# Patient Record
Sex: Male | Born: 1970 | Race: White | Hispanic: No | Smoking: Current some day smoker
Health system: Southern US, Community
[De-identification: ages and names within clinical notes are randomized; demographics above are authoritative.]

## PROBLEM LIST (undated history)

## (undated) DIAGNOSIS — K219 Gastro-esophageal reflux disease without esophagitis: Secondary | ICD-10-CM

## (undated) DIAGNOSIS — M549 Dorsalgia, unspecified: Secondary | ICD-10-CM

## (undated) DIAGNOSIS — R7303 Prediabetes: Secondary | ICD-10-CM

## (undated) DIAGNOSIS — M1990 Unspecified osteoarthritis, unspecified site: Secondary | ICD-10-CM

## (undated) DIAGNOSIS — R768 Other specified abnormal immunological findings in serum: Secondary | ICD-10-CM

## (undated) DIAGNOSIS — M25569 Pain in unspecified knee: Secondary | ICD-10-CM

## (undated) DIAGNOSIS — F419 Anxiety disorder, unspecified: Secondary | ICD-10-CM

## (undated) DIAGNOSIS — M5416 Radiculopathy, lumbar region: Secondary | ICD-10-CM

## (undated) DIAGNOSIS — I10 Essential (primary) hypertension: Secondary | ICD-10-CM

## (undated) DIAGNOSIS — G8929 Other chronic pain: Secondary | ICD-10-CM

## (undated) DIAGNOSIS — K7400 Hepatic fibrosis, unspecified: Secondary | ICD-10-CM

## (undated) HISTORY — PX: OTHER SURGICAL HISTORY: SHX169

## (undated) HISTORY — PX: ELBOW SURGERY: SHX618

## (undated) HISTORY — PX: SEPTOPLASTY: SUR1290

## (undated) HISTORY — PX: HERNIA REPAIR: SHX51

## (undated) HISTORY — DX: Other specified abnormal immunological findings in serum: R76.8

## (undated) HISTORY — PX: BACK SURGERY: SHX140

## (undated) HISTORY — DX: Hepatic fibrosis, unspecified: K74.00

## (undated) HISTORY — PX: FOOT SURGERY: SHX648

## (undated) HISTORY — PX: KNEE SURGERY: SHX244

---

## 2005-05-07 ENCOUNTER — Emergency Department (HOSPITAL_COMMUNITY): Admission: EM | Admit: 2005-05-07 | Discharge: 2005-05-07 | Payer: Self-pay | Admitting: Emergency Medicine

## 2006-07-17 ENCOUNTER — Emergency Department (HOSPITAL_COMMUNITY): Admission: EM | Admit: 2006-07-17 | Discharge: 2006-07-17 | Payer: Self-pay | Admitting: Emergency Medicine

## 2007-05-19 ENCOUNTER — Emergency Department (HOSPITAL_COMMUNITY): Admission: EM | Admit: 2007-05-19 | Discharge: 2007-05-19 | Payer: Self-pay | Admitting: Emergency Medicine

## 2007-11-17 ENCOUNTER — Emergency Department (HOSPITAL_COMMUNITY): Admission: EM | Admit: 2007-11-17 | Discharge: 2007-11-17 | Payer: Self-pay | Admitting: Emergency Medicine

## 2008-01-10 ENCOUNTER — Ambulatory Visit (HOSPITAL_COMMUNITY): Admission: RE | Admit: 2008-01-10 | Discharge: 2008-01-10 | Payer: Self-pay | Admitting: General Surgery

## 2008-01-29 ENCOUNTER — Ambulatory Visit: Payer: Self-pay | Admitting: Internal Medicine

## 2008-02-18 ENCOUNTER — Ambulatory Visit (HOSPITAL_COMMUNITY): Admission: RE | Admit: 2008-02-18 | Discharge: 2008-02-18 | Payer: Self-pay | Admitting: Internal Medicine

## 2008-02-18 ENCOUNTER — Ambulatory Visit: Payer: Self-pay | Admitting: Internal Medicine

## 2008-03-19 ENCOUNTER — Ambulatory Visit: Payer: Self-pay | Admitting: Internal Medicine

## 2009-02-25 ENCOUNTER — Encounter (INDEPENDENT_AMBULATORY_CARE_PROVIDER_SITE_OTHER): Payer: Self-pay | Admitting: *Deleted

## 2009-04-19 ENCOUNTER — Emergency Department (HOSPITAL_COMMUNITY): Admission: EM | Admit: 2009-04-19 | Discharge: 2009-04-19 | Payer: Self-pay | Admitting: Emergency Medicine

## 2010-06-10 ENCOUNTER — Inpatient Hospital Stay (HOSPITAL_COMMUNITY)
Admission: RE | Admit: 2010-06-10 | Discharge: 2010-06-13 | DRG: 313 | Disposition: A | Discharge status: Home / Self Care | Payer: Medicaid Other | Source: Other Acute Inpatient Hospital | Attending: Cardiology | Admitting: Cardiology

## 2010-06-10 ENCOUNTER — Emergency Department (HOSPITAL_COMMUNITY): Payer: Medicaid Other

## 2010-06-10 ENCOUNTER — Emergency Department (HOSPITAL_COMMUNITY)
Admission: EM | Admit: 2010-06-10 | Discharge: 2010-06-10 | Disposition: A | Discharge status: Other Acute Inpatient Hospital | Payer: Medicaid Other | Source: Home / Self Care | Attending: Emergency Medicine | Admitting: Emergency Medicine

## 2010-06-10 DIAGNOSIS — F1210 Cannabis abuse, uncomplicated: Secondary | ICD-10-CM | POA: Diagnosis present

## 2010-06-10 DIAGNOSIS — Z87891 Personal history of nicotine dependence: Secondary | ICD-10-CM

## 2010-06-10 DIAGNOSIS — R079 Chest pain, unspecified: Secondary | ICD-10-CM | POA: Insufficient documentation

## 2010-06-10 DIAGNOSIS — M6282 Rhabdomyolysis: Secondary | ICD-10-CM | POA: Diagnosis present

## 2010-06-10 DIAGNOSIS — R0789 Other chest pain: Secondary | ICD-10-CM

## 2010-06-10 DIAGNOSIS — R42 Dizziness and giddiness: Secondary | ICD-10-CM | POA: Insufficient documentation

## 2010-06-10 DIAGNOSIS — K7689 Other specified diseases of liver: Secondary | ICD-10-CM | POA: Diagnosis present

## 2010-06-10 DIAGNOSIS — I200 Unstable angina: Secondary | ICD-10-CM | POA: Insufficient documentation

## 2010-06-10 DIAGNOSIS — E669 Obesity, unspecified: Secondary | ICD-10-CM | POA: Diagnosis present

## 2010-06-10 DIAGNOSIS — Z6830 Body mass index (BMI) 30.0-30.9, adult: Secondary | ICD-10-CM

## 2010-06-10 LAB — DIFFERENTIAL
Basophils Absolute: 0 10*3/uL (ref 0.0–0.1)
Basophils Relative: 0 % (ref 0–1)
Eosinophils Absolute: 0.1 10*3/uL (ref 0.0–0.7)
Eosinophils Relative: 1 % (ref 0–5)
Lymphocytes Relative: 13 % (ref 12–46)
Lymphs Abs: 1.4 10*3/uL (ref 0.7–4.0)
Monocytes Absolute: 0.6 10*3/uL (ref 0.1–1.0)
Monocytes Relative: 5 % (ref 3–12)
Neutro Abs: 8.7 10*3/uL — ABNORMAL HIGH (ref 1.7–7.7)
Neutrophils Relative %: 81 % — ABNORMAL HIGH (ref 43–77)

## 2010-06-10 LAB — BASIC METABOLIC PANEL
BUN: 21 mg/dL (ref 6–23)
CO2: 22 mEq/L (ref 19–32)
Calcium: 9.7 mg/dL (ref 8.4–10.5)
Chloride: 104 mEq/L (ref 96–112)
Creatinine, Ser: 1.26 mg/dL (ref 0.4–1.5)
GFR calc Af Amer: >60 mL/min (ref >60)
GFR calc non Af Amer: >60 mL/min (ref >60)
Glucose, Bld: 139 mg/dL — ABNORMAL HIGH (ref 70–99)
Potassium: 3.8 mEq/L (ref 3.5–5.1)
Sodium: 138 mEq/L (ref 135–145)

## 2010-06-10 LAB — CARDIAC PANEL(CRET KIN+CKTOT+MB+TROPI)
CK, MB: 15.4 ng/mL (ref 0.3–4.0)
Relative Index: 0.9 (ref 0.0–2.5)
Total CK: 1798 U/L — ABNORMAL HIGH (ref 7–232)
Troponin I: 0.01 ng/mL (ref 0.00–0.06)

## 2010-06-10 LAB — CBC
HCT: 40.1 % (ref 39.0–52.0)
Hemoglobin: 14.1 g/dL (ref 13.0–17.0)
MCH: 30.6 pg (ref 26.0–34.0)
MCHC: 35.2 g/dL (ref 30.0–36.0)
MCV: 87 fL (ref 78.0–100.0)
Platelets: 237 10*3/uL (ref 150–400)
RBC: 4.61 MIL/uL (ref 4.22–5.81)
RDW: 13.3 % (ref 11.5–15.5)
WBC: 10.7 10*3/uL — ABNORMAL HIGH (ref 4.0–10.5)

## 2010-06-10 LAB — HEPATIC FUNCTION PANEL
ALT: 54 U/L — ABNORMAL HIGH (ref 0–53)
AST: 114 U/L — ABNORMAL HIGH (ref 0–37)
Albumin: 4.7 g/dL (ref 3.5–5.2)
Alkaline Phosphatase: 31 U/L — ABNORMAL LOW (ref 39–117)
Bilirubin, Direct: 0.2 mg/dL (ref 0.0–0.3)
Indirect Bilirubin: 1.6 mg/dL — ABNORMAL HIGH (ref 0.3–0.9)
Total Bilirubin: 1.8 mg/dL — ABNORMAL HIGH (ref 0.3–1.2)
Total Protein: 7.7 g/dL (ref 6.0–8.3)

## 2010-06-10 LAB — POCT CARDIAC MARKERS
CKMB, poc: 14.4 ng/mL (ref 1.0–8.0)
CKMB, poc: 12.3 ng/mL (ref 1.0–8.0)
Myoglobin, poc: >500 ng/mL (ref 12–200)
Myoglobin, poc: >500 ng/mL (ref 12–200)
Troponin i, poc: <0.05 ng/mL (ref 0.00–0.09)
Troponin i, poc: <0.05 ng/mL (ref 0.00–0.09)

## 2010-06-10 LAB — GLUCOSE, CAPILLARY: Glucose-Capillary: 122 mg/dL — ABNORMAL HIGH (ref 70–99)

## 2010-06-11 DIAGNOSIS — I200 Unstable angina: Secondary | ICD-10-CM

## 2010-06-11 LAB — CBC
HCT: 36.5 % — ABNORMAL LOW (ref 39.0–52.0)
Hemoglobin: 12.7 g/dL — ABNORMAL LOW (ref 13.0–17.0)
MCH: 30.5 pg (ref 26.0–34.0)
MCHC: 34.8 g/dL (ref 30.0–36.0)
MCV: 87.5 fL (ref 78.0–100.0)
Platelets: 216 10*3/uL (ref 150–400)
RBC: 4.17 MIL/uL — ABNORMAL LOW (ref 4.22–5.81)
RDW: 13.4 % (ref 11.5–15.5)
WBC: 9.2 10*3/uL (ref 4.0–10.5)

## 2010-06-11 LAB — CARDIAC PANEL(CRET KIN+CKTOT+MB+TROPI)
CK, MB: 11.7 ng/mL (ref 0.3–4.0)
CK, MB: 9.7 ng/mL (ref 0.3–4.0)
Relative Index: 1 (ref 0.0–2.5)
Relative Index: 1.1 (ref 0.0–2.5)
Total CK: 1190 U/L — ABNORMAL HIGH (ref 7–232)
Total CK: 879 U/L — ABNORMAL HIGH (ref 7–232)
Troponin I: 0.02 ng/mL (ref 0.00–0.06)
Troponin I: <0.01 ng/mL (ref 0.00–0.06)

## 2010-06-11 LAB — LIPID PANEL
Cholesterol: 131 mg/dL (ref 0–200)
HDL: 39 mg/dL — ABNORMAL LOW (ref >39)
LDL Cholesterol: 79 mg/dL (ref 0–99)
Total CHOL/HDL Ratio: 3.4 RATIO
Triglycerides: 67 mg/dL (ref <150)
VLDL: 13 mg/dL (ref 0–40)

## 2010-06-11 LAB — HEPARIN LEVEL (UNFRACTIONATED): Heparin Unfractionated: 0.71 IU/mL — ABNORMAL HIGH (ref 0.30–0.70)

## 2010-06-11 LAB — HEMOGLOBIN A1C
Hgb A1c MFr Bld: 5.2 % (ref <5.7)
Mean Plasma Glucose: 103 mg/dL (ref <117)

## 2010-06-12 ENCOUNTER — Inpatient Hospital Stay (HOSPITAL_COMMUNITY): Payer: Medicaid Other

## 2010-06-12 LAB — CBC
HCT: 35.7 % — ABNORMAL LOW (ref 39.0–52.0)
Hemoglobin: 12.6 g/dL — ABNORMAL LOW (ref 13.0–17.0)
MCH: 31.1 pg (ref 26.0–34.0)
MCHC: 35.3 g/dL (ref 30.0–36.0)
MCV: 88.1 fL (ref 78.0–100.0)
Platelets: 205 10*3/uL (ref 150–400)
RBC: 4.05 MIL/uL — ABNORMAL LOW (ref 4.22–5.81)
RDW: 13.3 % (ref 11.5–15.5)
WBC: 7.1 10*3/uL (ref 4.0–10.5)

## 2010-06-12 LAB — HEPARIN LEVEL (UNFRACTIONATED)
Heparin Unfractionated: 0.28 IU/mL — ABNORMAL LOW (ref 0.30–0.70)
Heparin Unfractionated: 0.28 IU/mL — ABNORMAL LOW (ref 0.30–0.70)
Heparin Unfractionated: 0.3 IU/mL (ref 0.30–0.70)

## 2010-06-12 LAB — HEPATITIS PANEL, ACUTE
HCV Ab: NEGATIVE
Hep A IgM: NEGATIVE
Hep B C IgM: NEGATIVE
Hepatitis B Surface Ag: NEGATIVE

## 2010-06-12 LAB — TSH: TSH: 0.541 u[IU]/mL (ref 0.350–4.500)

## 2010-06-13 DIAGNOSIS — R079 Chest pain, unspecified: Secondary | ICD-10-CM

## 2010-06-13 LAB — COMPREHENSIVE METABOLIC PANEL
ALT: 42 U/L (ref 0–53)
AST: 33 U/L (ref 0–37)
Albumin: 4.2 g/dL (ref 3.5–5.2)
Alkaline Phosphatase: 26 U/L — ABNORMAL LOW (ref 39–117)
BUN: 11 mg/dL (ref 6–23)
CO2: 24 mEq/L (ref 19–32)
Calcium: 9.1 mg/dL (ref 8.4–10.5)
Chloride: 108 mEq/L (ref 96–112)
Creatinine, Ser: 1.01 mg/dL (ref 0.4–1.5)
GFR calc Af Amer: >60 mL/min (ref >60)
GFR calc non Af Amer: >60 mL/min (ref >60)
Glucose, Bld: 92 mg/dL (ref 70–99)
Potassium: 4 mEq/L (ref 3.5–5.1)
Sodium: 140 mEq/L (ref 135–145)
Total Bilirubin: 0.9 mg/dL (ref 0.3–1.2)
Total Protein: 7.3 g/dL (ref 6.0–8.3)

## 2010-06-13 LAB — PROTIME-INR
INR: 0.98 (ref 0.00–1.49)
Prothrombin Time: 13.2 seconds (ref 11.6–15.2)

## 2010-06-13 LAB — CBC
HCT: 38.8 % — ABNORMAL LOW (ref 39.0–52.0)
Hemoglobin: 13.7 g/dL (ref 13.0–17.0)
MCH: 31.1 pg (ref 26.0–34.0)
MCHC: 35.3 g/dL (ref 30.0–36.0)
MCV: 88.2 fL (ref 78.0–100.0)
Platelets: 240 10*3/uL (ref 150–400)
RBC: 4.4 MIL/uL (ref 4.22–5.81)
RDW: 13.3 % (ref 11.5–15.5)
WBC: 7.3 10*3/uL (ref 4.0–10.5)

## 2010-06-13 LAB — HEPARIN LEVEL (UNFRACTIONATED): Heparin Unfractionated: 0.52 IU/mL (ref 0.30–0.70)

## 2010-06-17 NOTE — Procedures (Signed)
  NAMESCHYLER, COUNSELL NO.:  [id number]  MEDICAL RECORD NO.:  [id number]          PATIENT TYPE:  LOCATION:                                 FACILITY:  PHYSICIAN:  Peter C. Eden Emms, MD, FACCDATE OF BIRTH:  Sep 25, 1970  DATE OF PROCEDURE: DATE OF DISCHARGE:                           CARDIAC CATHETERIZATION   CORONARY ARTERIOGRAPHY  INDICATION:  Recurrent chest pain although the patient has known esophagitis and gastritis.  He has been having recurrent symptoms. Catheterization was done to rule out coronary disease.  Left main coronary artery is normal.  Left anterior descending artery was normal in the proximal, mid, and distal portions.  The first diagonal branch was normal.  Second diagonal branch was normal.  Third diagonal branch was normal.  Circumflex coronary artery was nondominant.  There was a single large branching obtuse marginal branch which was normal.  The AV groove branch was normal.  The right coronary artery was dominant and normal.  RAO ventriculography.  RAO ventriculography was normal, EF was 60%. There was no gradient across the aortic valve and no MR.  Aortic pressure was 129/97, LV pressure was 142/70.  IMPRESSION:  The patient has no significant coronary artery disease. His pain would appear to be from his gastritis.  Further gastrointestinal followup is in order.  We did try to Angio-Seal him but had some bleeding.  He will be maintained here in the hospital for 4 hours and discharge later today as long as his groin heals well.     Noralyn Pick. Eden Emms, MD, [facility]     PCN/MEDQ  D:  06/13/2010  T:  06/13/2010  Job:  [id number]  Electronically Signed by Charlton Haws MD [facility] on 06/16/2010 09:08:58 AM

## 2010-06-21 ENCOUNTER — Emergency Department (HOSPITAL_COMMUNITY)
Admission: EM | Admit: 2010-06-21 | Discharge: 2010-06-21 | Disposition: A | Discharge status: Home / Self Care | Payer: Medicaid Other | Attending: Emergency Medicine | Admitting: Emergency Medicine

## 2010-06-21 ENCOUNTER — Encounter: Payer: Self-pay | Admitting: Orthopedic Surgery

## 2010-06-21 ENCOUNTER — Telehealth: Payer: Self-pay | Admitting: *Deleted

## 2010-06-21 DIAGNOSIS — S8010XA Contusion of unspecified lower leg, initial encounter: Secondary | ICD-10-CM

## 2010-06-21 DIAGNOSIS — M79609 Pain in unspecified limb: Secondary | ICD-10-CM | POA: Insufficient documentation

## 2010-06-21 LAB — COMPREHENSIVE METABOLIC PANEL
ALT: 27 U/L (ref 0–53)
AST: 18 U/L (ref 0–37)
Albumin: 4.6 g/dL (ref 3.5–5.2)
Alkaline Phosphatase: 28 U/L — ABNORMAL LOW (ref 39–117)
BUN: 19 mg/dL (ref 6–23)
CO2: 28 mEq/L (ref 19–32)
Calcium: 9.5 mg/dL (ref 8.4–10.5)
Chloride: 101 mEq/L (ref 96–112)
Creatinine, Ser: 1.13 mg/dL (ref 0.4–1.5)
GFR calc Af Amer: >60 mL/min (ref >60)
GFR calc non Af Amer: >60 mL/min (ref >60)
Glucose, Bld: 90 mg/dL (ref 70–99)
Potassium: 3.9 mEq/L (ref 3.5–5.1)
Sodium: 137 mEq/L (ref 135–145)
Total Bilirubin: 1.2 mg/dL (ref 0.3–1.2)
Total Protein: 7.6 g/dL (ref 6.0–8.3)

## 2010-06-21 LAB — DIFFERENTIAL
Basophils Absolute: 0 10*3/uL (ref 0.0–0.1)
Basophils Relative: 1 % (ref 0–1)
Eosinophils Absolute: 0 10*3/uL (ref 0.0–0.7)
Eosinophils Relative: 1 % (ref 0–5)
Lymphocytes Relative: 21 % (ref 12–46)
Lymphs Abs: 1.6 10*3/uL (ref 0.7–4.0)
Monocytes Absolute: 0.5 10*3/uL (ref 0.1–1.0)
Monocytes Relative: 7 % (ref 3–12)
Neutro Abs: 5.4 10*3/uL (ref 1.7–7.7)
Neutrophils Relative %: 71 % (ref 43–77)

## 2010-06-21 LAB — CBC
HCT: 40.1 % (ref 39.0–52.0)
Hemoglobin: 14.2 g/dL (ref 13.0–17.0)
MCH: 31.1 pg (ref 26.0–34.0)
MCHC: 35.4 g/dL (ref 30.0–36.0)
MCV: 87.9 fL (ref 78.0–100.0)
Platelets: 255 10*3/uL (ref 150–400)
RBC: 4.56 MIL/uL (ref 4.22–5.81)
RDW: 13.3 % (ref 11.5–15.5)
WBC: 7.6 10*3/uL (ref 4.0–10.5)

## 2010-06-21 LAB — D-DIMER, QUANTITATIVE (NOT AT ARMC): D-Dimer, Quant: 0.22 ug/mL-FEU (ref 0.00–0.48)

## 2010-06-22 ENCOUNTER — Ambulatory Visit (HOSPITAL_COMMUNITY)
Admit: 2010-06-22 | Discharge: 2010-06-22 | Disposition: A | Discharge status: Home / Self Care | Payer: Medicaid Other | Source: Ambulatory Visit | Attending: Emergency Medicine | Admitting: Emergency Medicine

## 2010-06-22 DIAGNOSIS — IMO0002 Reserved for concepts with insufficient information to code with codable children: Secondary | ICD-10-CM | POA: Insufficient documentation

## 2010-06-22 DIAGNOSIS — Y840 Cardiac catheterization as the cause of abnormal reaction of the patient, or of later complication, without mention of misadventure at the time of the procedure: Secondary | ICD-10-CM | POA: Insufficient documentation

## 2010-06-23 ENCOUNTER — Telehealth: Payer: Self-pay | Admitting: Cardiovascular Disease
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
## 2010-06-27 ENCOUNTER — Encounter: Payer: Self-pay | Admitting: *Deleted
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
## 2010-06-28 NOTE — Progress Notes (Signed)
Summary: Pain in leg where cath was done  Phone Note Call from Patient Call back at [phone]   Caller: Spouse Rosaland Lao) Reason for Call: Talk to Nurse Summary of Call: patient's wife states that patient is having pain when standing / pt had cath last week / pls leave message if no answer at above number/tg Initial call taken by: Raechel Ache [facility],  June 21, 2010 11:38 AM  Follow-up for Phone Call        Per recommendations by K.Lawrence,NP asked pt to go to ED for evaluaition of leg for bleed or clot.Called ED and spoke with charge nurse to report pt to come at 4:30pm Follow-up by: Teressa Lower RN,  June 21, 2010 1:09 PM  Additional Follow-up for Phone Call Additional follow up Details #1::        faxed, records to ED, [phone] atten charge nurse Additional Follow-up by: Teressa Lower RN,  June 21, 2010 1:13 PM

## 2010-07-07 NOTE — Letter (Signed)
Summary: Clearance Letter  [employer], Main Office  [address]   [place], [place] [address]   Phone: [phone]  Fax: [phone]    June 27, 2010  Re:     Jamie [name] Address:   [address]     [place], [place]  [address] DOB:     1970/07/22 MRN:     [id number]   TO WHOM IT MAY CONCERN,       Mr Gartrell is clear from a cardiac standpoint for dental work. Please call with any questions or concerns.    Sincerely,  Deliah Goody, RN/Dr Charlton Haws

## 2010-07-07 NOTE — Progress Notes (Signed)
Summary: pt needs okay for tooth extraction/2nd call  Phone Note Call from Patient Call back at [phone]   Caller: Patient 's wife  Jamie [name] Summary of Call: needs letter to dentist for okay on tooth extraction fax to [phone] att Jamie [name] Initial call taken by: Glynda Jaeger,  June 23, 2010 9:32 AM  Follow-up for Phone Call        2nd call re letter for  dental work Follow-up by: Roe Coombs,  June 23, 2010 2:57 PM  Additional Follow-up for Phone Call Additional follow up Details #1::        number disconnected Deliah Goody, RN  June 23, 2010 5:44 PM Pt returning call,call her back at [phone] [facility]  June 27, 2010 8:10 AM  left message at number provided. pt had a normal cath and will be fine for dental work. note faxed to number provided. Deliah Goody, RN  June 27, 2010 3:32 PM

## 2010-07-20 ENCOUNTER — Encounter: Payer: Self-pay | Admitting: Orthopedic Surgery

## 2010-07-20 ENCOUNTER — Ambulatory Visit (INDEPENDENT_AMBULATORY_CARE_PROVIDER_SITE_OTHER): Payer: Medicaid Other | Admitting: Orthopedic Surgery

## 2010-07-20 VITALS — HR 76 | Resp 18 | Ht 71.0 in | Wt 226.0 lb

## 2010-07-20 DIAGNOSIS — IMO0002 Reserved for concepts with insufficient information to code with codable children: Secondary | ICD-10-CM

## 2010-07-20 DIAGNOSIS — M1710 Unilateral primary osteoarthritis, unspecified knee: Secondary | ICD-10-CM

## 2010-07-20 MED ORDER — NABUMETONE 500 MG PO TABS: 500.0000 mg | ORAL_TABLET | Freq: Two times a day (BID) | ORAL | Status: AC

## 2010-07-20 MED ORDER — METHYLPREDNISOLONE ACETATE 40 MG/ML IJ SUSP: 40.0000 mg | Freq: Once | INTRAMUSCULAR | Status: DC

## 2010-07-20 NOTE — Progress Notes (Signed)
-  year-old male complains of bilateral knee pain and swelling with sharp throbbing stabbing burning constant pain worse with activity and relieved somewhat with rest.  The RIGHT and LEFT lower extremities however have different symptoms  On the RIGHT side he complains of random intermittent giving out of the RIGHT lower extremity with RIGHT leg discoloration where the leg will turn blue or dark.  He denies history of lumbar disc disease but does admit to lower back pain and right-sided lumbar discomfort.  The LEFT knee he reports crepitance and anterior medial and lateral pain which is diffuse and nonspecific but nonradiating.  Treatments to this point include ibuprofen and extra strength Tylenol.  He has no injection history  He does note that after his RIGHT knee surgery his knee did improve until about 10 years ago when he started having the giving out symptoms  Review of systems for chest pain which was worked up and was found to be negative he has some snoring constipation heartburn frequency joint pain and swelling stiffness of the knee no skin changes he does report some numbness and tingling unsteady gait as reported.  He has some depression denies easy bleeding or bruising has excessive urination but denies seasonal ALLERGIES unexpected weight loss or blurred vision   General: The patient is normally developed, with normal grooming and hygiene. There are no gross deformities. The body habitus is normal   CDV: The pulse and perfusion of the extremities are normal   LYMPH: There is no gross lymphadenopathy in the extremities   Skin: There are no rashes, ulcers or cafe-au-lait spot   Psyche: The patient is alert, awake and oriented.  Mood is normal   Neuro:  The coordination and balance are normal.  Sensation is normal. Reflexes are 2+ and equal   Musculoskeletal  RIGHT lower extremity exam straight leg raise did produce some hip/back pain in the lower RIGHT lumbar spine.  However  his ligaments were stable he had no joint line tenderness and no swelling his muscle tone is excellent.  His range of motion was full.  I was able to elicit patellofemoral crepitance and he had a positive quadriceps contraction test for patellofemoral pain  LEFT knee exam showed stable ligaments no swelling no tenderness he did have pain with the quadriceps contraction maneuver.  His muscle strength and muscle tone was normal range of motion and the knee was normal.  Straight leg raise test on the LEFT was normal  I also examined his lumbar spine he is being followed for a subcutaneous mass on the RIGHT lower part of his back.  He had no buttock tenderness he did have some lower lumbar spine tenderness and pain with extension no pain with flexion.  Radiographs were taken in the office and it shows that both knees have adequate and abundant joint space between the femoral tibial joints.  There is no evidence of significant patellofemoral disease.  He does have 2 screws in the RIGHT knee for patella fracture  Impression patellofemoral arthritis  I recommended we try a nonsteroidal along with injections.  He agreed to allow me to send a report to his primary care physician.  I recommend that he get a vascular consult to evaluate the intermittent random discoloration of the RIGHT lower extremity.  I also am going to recommend his primary care physician did a neurologist to evaluate his unexplainable giving out of the RIGHT leg with a normal knee exam.

## 2010-07-20 NOTE — Patient Instructions (Signed)
You have received a steroid shot. 15% of patients experience increased pain at the injection site with in the next 24 hours. This is best treated with ice and tylenol extra strength 2 tabs every 8 hours. If you are still having pain please call the office.    Start new medication

## 2010-07-20 NOTE — Discharge Summary (Signed)
Dear Kingsley Plan  I have evaluated your patient Jamie [name].  He has patellofemoral arthritis in both knees.  I gave him 2 injections and started him on nabumetone 500 mg twice a day.  However, he has other complaints which are not orthopaedic and I think you should be aware of these and perhaps work him up further.  He has a complaint of discoloration of his RIGHT lower extremity with the leg intermittently and randomly turning blue and dusky.  He has a family history of peripheral vascular disease.  He also complains of giving out of the RIGHT lower extremity but he has a normal ligament exam of the RIGHT knee and very minimal arthritic changes on x-ray which did not explain this.  A neurology consult is probably in his best interest but I will leave that to your discretion.  Sincerely,  Dr. Romeo Apple

## 2010-08-23 NOTE — Op Note (Signed)
Jamie [name], Jamie [name]                ACCOUNT NO.:  [id number]   MEDICAL RECORD NO.:  [id number]          PATIENT TYPE:  AMB   LOCATION:  DAY                           FACILITY:  [facility]   PHYSICIAN:  R. Roetta Sessions, M.D. DATE OF BIRTH:  1970-08-29   DATE OF PROCEDURE:  DATE OF DISCHARGE:                               OPERATIVE REPORT   PROCEDURE:  Diagnostic EGD followed by diagnostic ileal colonoscopy.   INDICATIONS FOR PROCEDURE:  A 40 year old gentleman with 31-month history  of intermittent hematochezia, Hemoccult positive, family history of  first-degree relative relatively at young age positive for colon cancer,  and has a long-standing gastroesophageal reflux disease symptoms and  describes esophageal dysphagia.  He has not been treated with any acid  suppression therapy.  EGD and colonoscopy is now being done.  Risks,  benefits, alternatives, and limitations have been reviewed and questions  answered.  Please see the documentation in the medical record.   PROCEDURE NOTE:  O2 saturation, blood pressure, pulse, and respirations  were monitored throughout the entire procedure.   CONSCIOUS SEDATION:  Versed 8 mg IV, Demerol 150 mg IV in divided doses,  Phenergan 25 mg IV diluted slow IV push to augment conscious sedation.   INSTRUMENT:  Pentax video chip system.   ANESTHESIA:  Cetacaine spray for topical pharyngeal anesthesia.   FINDINGS:  EGD; the examination of tubular esophagus revealed a widely  patent tubular esophagus through the EG junction.  There were some  inverted V erosions straddling the EG junction.  There was no Barrett  esophagus.  There was no neoplasm.  The esophagus did not have any  appearance of eosinophilic infiltration.  There were no furrows.  There  was no ringed appearance.  EG junction was easily traversed.  Stomach:  Gastric cavity was emptied and insufflated well with air.  Thorough  examination of the gastric mucosa including retroflexed view of  the  proximal stomach and esophagogastric junction demonstrated only a small  hiatal hernia.  Pylorus was patent and easily traversed.  Examination of  the bulb, second portion revealed no abnormalities.  Therapeutic/diagnostic maneuvers performed, none.   The patient tolerated the procedure well and he was prepared for  colonoscopy.  Digital rectal exam revealed no abnormalities.  The scope  was placed.  Prep was adequate.  Colon:  Colonic mucosa was surveyed  from the rectosigmoid junction through the left transverse, right colon,  the appendiceal orifice, ileocecal valve, and cecum.  These structures  were well seen and photographed for the record.  Terminal ileum was  intubated to 10 cm.  From this level, the scope was slowly and  cautiously withdrawn.  All previously mentioned mucosal surfaces were  again seen.  The patient had an orange bilious coating of stool in right  colon, which made the exam more interesting (the patient consumed orange  Jello yesterday).  Mucosal surface were ultimately well seen and mucosa  of the colon appeared normal as did the terminal ileal mucosa.  Scope  was pulled down the rectum where thorough examination of rectal mucosa  including retroflexion  of the anal verge and anoscopy of the anal canal  demonstrated friable anal canal hemorrhoids only.  The patient tolerated  both procedures well and was reactive to Endoscopy.   IMPRESSION:  1. EGD; distal esophageal erosions consistent with erosive reflux      esophagitis, patulous esophagogastric junction, and widely tubular      patent esophagus.  No endoscopic evidence for eosinophilic      esophagitis.  2. Small hiatal hernia, otherwise normal stomach, D1 and D2.  Colonoscopy findings; friable anal canal hemorrhoids, otherwise normal  rectum, colon, terminal ileum.   RECOMMENDATIONS:  1. Hemorrhoid and gastroesophageal reflux literature provided to Mr.      Carino.  Began Aciphex 20 mg orally daily  30 minutes before      breakfast.  He is to go to my office for free samples.  A 10-day      course of Anusol-HC suppositories 1 per rectum at bedtime.  2. Follow up with [place] in 1 month, so we can assess his progress.      Jonathon Bellows, M.D.  Electronically Signed     RMR/MEDQ  D:  02/18/2008  T:  02/19/2008  Job:  [id number]   cc:   Dr. Scarlett Presto

## 2010-08-23 NOTE — H&P (Signed)
Jamie [name], Jamie [name]                ACCOUNT NO.:  [id number]   MEDICAL RECORD NO.:  [id number]          PATIENT TYPE:  AMB   LOCATION:  DAY                           FACILITY:  [facility]   PHYSICIAN:  R. Roetta Sessions, M.D. DATE OF BIRTH:  1970/12/26   DATE OF ADMISSION:  DATE OF DISCHARGE:  LH                              HISTORY & PHYSICAL   REFERRING PHYSICIAN:  Barbaraann Barthel, MD.   REASON FOR CONSULTATION:  Hematochezia and positive family history of  colon cancer.   HISTORY OF PRESENT ILLNESS:  [name] is a very pleasant 40-year-  old gentleman sent over courtesy of Dr. Barbaraann Barthel to further  evaluate hematochezia and Hemoccult positive stool.  Jamie [name] has noted  intermittent blood per rectum when having, otherwise, normal-appearing  bowel movements over the past 2 months.  He was seen by Dr. Malvin Johns  recently who performed anoscopy and found hemorrhoids, but there was no  evidence of bleeding and felt he needed further evaluation.   Jamie [name] has had some vague right-sided abdominal pain and it has noted  a knot just to the right of the spine he has had for years and  periodically swells up, and it causes him severe discomfort with  radiation around to the right abdomen.  He is barely perceptible when it  is not causing him any problems, but it gets as large as a golf ball  on occasion.  There is no drainage.  Jamie [name] denies melena.  He really  denies constipation or diarrhea.  He has never had his lower GI tract  imaged.  He also has daily heartburn symptoms, which he has had for many  many years and describes a prominent esophageal dysphagia to solids and  pills.  He has never had his upper GI tract evaluated.  He has a family  history most significant for his father being diagnosed with colorectal  cancer in his late 30s and as stated, Jamie [name] has really had no  imaging of his rectum/colon previously.  He has not lost any weight.  He  has used [employer] and other NSAIDs on a daily basis in the past,  but has not really used any of those agents in the last several months.  He has no history of peptic ulcer disease or other gastrointestinal  illness.   I would like to thank Dr. Barbaraann Barthel for allowing me to see this  very nice gentleman.  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  Otherwise, no acute or concerning findings,   PAST MEDICAL HISTORY:  Significant for chronic back pain, had multiple  head injuries without apparent major sequelae.   PAST SURGERIES:  Right inguinal hernia repair as a child, right foot  surgery, and right knee repair.  He had a compound fracture of the left  arm for which he had surgery.   CURRENT MEDICATIONS:  1. Flexeril 10 mg at bedtime.  2. Finishing a course of antibiotics, unknown type, for his back.  3. Tylenol p.r.n.  4. Xanax p.r.n., currently not taking.   ALLERGIES:  No known drug allergies.   FAMILY HISTORY:  Positive for colorectal cancer in his father at a  relatively young age as stated.  Mother is alive with diabetes.  Father  is living with a history of colon cancer, diabetes, and heart disease;  otherwise, a negative chronic GI or liver illness.   SOCIAL HISTORY:  The patient is married.  He has 2 children.  He works  at [employer] doing various jobs, but otherwise is not employed.  He  smokes one pack of cigarettes about every 3 days.  He has had a social  drinking history, but has not had any at all in the past 1 year.   REVIEW OF SYSTEMS:  As in history of present illness.  No chest pain.  No dyspnea on exertion.  No fever or chills.  No change in weight.  No  night sweats.   PHYSICAL EXAMINATION:  GENERAL:  A pleasant 40 year old gentleman  accompanied by his wife.  VITAL SIGNS:  Weight 280 pounds,  height 5 feet 11 inches, temperature  91, BP 126/88, and pulse 60.  SKIN:  Warm and dry.  There is no jaundice.  HEENT:  No scleral icterus.  Conjunctivae are pink.  CHEST:  Lungs are clear to auscultation.  CARDIAC:  Regular rate and rhythm without murmur, gallop, or rub.  ABDOMEN:  Obese, positive bowel sounds.  Soft and nontender without  appreciable mass or organomegaly.  EXTREMITIES:  No edema.  BACK:  He does have a marble-size soft tissue mass which is freely  movable to rather deep palpation just to the right of the lumbar spine.  There is no skin lesions overlying this area.  It is nontender.  RECTAL:  Deferred at the time of colonoscopy.   IMPRESSION:  [name] is a very pleasant 40 year old gentleman  with a 49-month history of intermittent hematochezia.  He is Hemoccult  positive.  He has a positive family history of colon cancer in a first-  degree relative at a relatively young age.  He has had some nonspecific  intermittent radicular-type right-sided abdominal pain.  He has a soft  tissue mass, which he has had for years which periodically becomes very  painful, enlarges and then shrinks.   Jamie [name] also has prominent long-standing gastroesophageal reflux  disease symptoms and describes esophageal dysphagia to pills and solid  food.   RECOMMENDATIONS:  Jamie [name] needs to have both a diagnostic colonoscopy  and an EGD with possible esophageal dilation as appropriate in the very  near future.  I reviewed the risks, benefits, alternatives, and  limitations in some detail with he and his wife today.  Questions were  answered and all parties agreeable.  His right-sided abdominal pain may  not have anything to do with hematochezia.  I do get a sense that there  is a radicular component from his right back/flank when he has problems  with the soft tissue mass.  He is to see Dr. Malvin Johns in regards to  surgical therapy of this lesion once he has had a workup for the  GI  bleed.  We will make further recommendations in the very near future  once the endoscopic evaluation has taken place.   In addition, I doubt that the lesion seen in his right  kidney on recent  CT scan has much to do with any of his current symptoms, but fully agree  with interval followup imaging.      Jamie [name], M.D.  Electronically Signed     RMR/MEDQ  D:  01/29/2008  T:  01/30/2008  Job:  [id number]   cc:   Barbaraann Barthel, M.D.  Fax: [phone]

## 2010-08-23 NOTE — Assessment & Plan Note (Signed)
[name], Jamie [name]                 CHART#:  [id number]   DATE:  03/19/2008                       DOB:  1970-08-09   FOLLOWUP:  GERD/erosive reflux esophagitis seen on EGD on 02/18/2008.  Colonoscopy at that time for hematochezia demonstrated friable anal  canal hemorrhoids, otherwise rectum, colon, and terminal ileum appeared  normal.  We started him on some Aciphex and this has been associated  with marked dramatic improvement in his reflux symptoms.  He is able to  sleep in bed at night and not have to remain upright in recliner.  Although he does have difficulty sleeping because of very loud snoring,  he is constantly awakened with snoring.  His wife states he stops  breathing.  He has gained 5 pounds since his last visit.  Overall, he is  very happy with his response to Aciphex.  He has a positive family  history of colon cancer in a relatively young first-degree relatives  (father's age 60).   CURRENT MEDICATIONS:  See updated list.   ALLERGIES:  No known drug allergies.   PHYSICAL EXAMINATION:  GENERAL:  He is accompanied by his wife.  VITAL SIGNS:  Weight 285, height 5 feet 11 inches, temperature 98.2, BP  110/82, and pulse 64.  Detailed exam was deferred.   ASSESSMENT:  1. Gastroesophageal reflux disease/history of erosive reflux      esophagitis, response with the Aciphex 20 mg orally daily.  2. Recent rectal bleeding found to be secondary to anal canal      hemorrhoids on colonoscopy, positive family history of colon      cancer.   RECOMMENDATIONS:  1. Strive for 15-20-pound weight loss over the next 12 months.      Continue Aciphex 20 mg orally daily.  Samples and prescription      given.  2. No late night meals, no eating before 3 hours going to bed.  3. Positive family history of colon cancer with essentially negative      colonoscopy recently.  He would need his next colonoscopy at age      30.  4. Loud snoring, poor sleep pattern.  I suspect, he has sleep  apnea.      I recommend he did talk      with Dr. Malvin Johns and Dr. Wende Crease about getting a sleep study.      Unless something comes up, I will plan to see this nice gentleman      back in 1 year.       Jonathon Bellows, M.D.  Electronically Signed     RMR/MEDQ  D:  03/19/2008  T:  03/19/2008  Job:  [id number]   cc:   Dr. Malvin Johns

## 2010-12-30 LAB — DIFFERENTIAL
Basophils Absolute: 0
Basophils Relative: 0
Eosinophils Absolute: 0.1
Eosinophils Relative: 2
Lymphocytes Relative: 26
Lymphs Abs: 2.3
Monocytes Absolute: 0.5
Monocytes Relative: 6
Neutro Abs: 5.9
Neutrophils Relative %: 66

## 2010-12-30 LAB — CBC
HCT: 41.5
Hemoglobin: 14.8
MCHC: 35.7
MCV: 87
Platelets: 240
RBC: 4.77
RDW: 13
WBC: 8.9

## 2010-12-30 LAB — BASIC METABOLIC PANEL
BUN: 20
CO2: 29
Calcium: 9
Chloride: 105
Creatinine, Ser: 1.09
GFR calc Af Amer: >60
GFR calc non Af Amer: >60
Glucose, Bld: 101 — ABNORMAL HIGH
Potassium: 4.7
Sodium: 138

## 2010-12-30 LAB — POCT CARDIAC MARKERS
CKMB, poc: 1.6
Myoglobin, poc: 154
Operator id: 270681
Troponin i, poc: <0.05

## 2011-01-06 LAB — BASIC METABOLIC PANEL
BUN: 16
CO2: 27
Calcium: 9.9
Chloride: 106
Creatinine, Ser: 1.07
GFR calc Af Amer: >60
GFR calc non Af Amer: >60
Glucose, Bld: 92
Potassium: 4
Sodium: 140

## 2011-01-06 LAB — URINE CULTURE
Colony Count: NO GROWTH
Culture: NO GROWTH

## 2011-01-06 LAB — DIFFERENTIAL
Basophils Absolute: 0
Basophils Relative: 1
Eosinophils Absolute: 0.1
Eosinophils Relative: 1
Lymphocytes Relative: 18
Lymphs Abs: 1.5
Monocytes Absolute: 0.5
Monocytes Relative: 6
Neutro Abs: 6.2
Neutrophils Relative %: 75

## 2011-01-06 LAB — URINALYSIS, ROUTINE W REFLEX MICROSCOPIC
Bilirubin Urine: NEGATIVE
Glucose, UA: NEGATIVE
Ketones, ur: NEGATIVE
Leukocytes, UA: NEGATIVE
Nitrite: NEGATIVE
Protein, ur: NEGATIVE
Specific Gravity, Urine: 1.02
Urobilinogen, UA: 0.2
pH: 6

## 2011-01-06 LAB — RAPID URINE DRUG SCREEN, HOSP PERFORMED
Amphetamines: NOT DETECTED
Barbiturates: NOT DETECTED
Benzodiazepines: POSITIVE — AB
Cocaine: NOT DETECTED
Opiates: POSITIVE — AB
Tetrahydrocannabinol: POSITIVE — AB

## 2011-01-06 LAB — URINE MICROSCOPIC-ADD ON

## 2011-01-06 LAB — CBC
HCT: 47.6
Hemoglobin: 16.2
MCHC: 34.1
MCV: 88
Platelets: 256
RBC: 5.41
RDW: 13.4
WBC: 8.3

## 2011-01-06 LAB — ETHANOL: Alcohol, Ethyl (B): <5

## 2011-01-13 ENCOUNTER — Encounter (HOSPITAL_COMMUNITY): Payer: Self-pay

## 2011-01-13 ENCOUNTER — Emergency Department (HOSPITAL_COMMUNITY): Payer: Medicaid Other

## 2011-01-13 ENCOUNTER — Emergency Department (HOSPITAL_COMMUNITY)
Admission: EM | Admit: 2011-01-13 | Discharge: 2011-01-13 | Disposition: A | Discharge status: Home / Self Care | Payer: Medicaid Other | Attending: Emergency Medicine | Admitting: Emergency Medicine

## 2011-01-13 DIAGNOSIS — W1809XA Striking against other object with subsequent fall, initial encounter: Secondary | ICD-10-CM | POA: Insufficient documentation

## 2011-01-13 DIAGNOSIS — W1789XA Other fall from one level to another, initial encounter: Secondary | ICD-10-CM | POA: Insufficient documentation

## 2011-01-13 DIAGNOSIS — S60229A Contusion of unspecified hand, initial encounter: Secondary | ICD-10-CM | POA: Insufficient documentation

## 2011-01-13 DIAGNOSIS — Z87891 Personal history of nicotine dependence: Secondary | ICD-10-CM | POA: Insufficient documentation

## 2011-01-13 DIAGNOSIS — IMO0002 Reserved for concepts with insufficient information to code with codable children: Secondary | ICD-10-CM | POA: Insufficient documentation

## 2011-01-13 DIAGNOSIS — S60511A Abrasion of right hand, initial encounter: Secondary | ICD-10-CM

## 2011-01-13 MED ORDER — HYDROCODONE-ACETAMINOPHEN 5-325 MG PO TABS: 1.0000 | ORAL_TABLET | ORAL | Status: AC | PRN

## 2011-01-13 MED ORDER — BACITRACIN-NEOMYCIN-POLYMYXIN 400-5-5000 EX OINT
TOPICAL_OINTMENT | Freq: Once | CUTANEOUS | Status: AC
  Administered 2011-01-13: 1 via TOPICAL
  Filled 2011-01-13: qty 1

## 2011-01-13 MED ORDER — CEPHALEXIN 500 MG PO CAPS: 500.0000 mg | ORAL_CAPSULE | Freq: Four times a day (QID) | ORAL | Status: AC

## 2011-01-13 NOTE — ED Provider Notes (Signed)
History     CSN: [id number] Arrival date & time: 01/13/2011  1:06 PM  Chief Complaint  Patient presents with  . Hand Injury    (Consider location/radiation/quality/duration/timing/severity/associated sxs/prior treatment) Patient is a 40 y.o. male presenting with hand injury. The history is provided by the patient.  Hand Injury  The incident occurred 3 to 5 hours ago. The incident occurred at home (he was helping brother do brick work when he fell off a ladder,  hitting his right hand and the edge of a brick pile.  He has abrasions and hand pain.  Denies any other injury.  He denies hitting his head.). The injury mechanism was a fall. The pain is present in the right hand. The quality of the pain is described as sharp. The pain is at a severity of 5/10. The pain is moderate. The pain has been constant since the incident. Pertinent negatives include no fever. He reports no foreign bodies present. The symptoms are aggravated by movement. He has tried ice (He had moderate swelling which has been greatly improved with ice  Patient is up to date with his tetanus.) for the symptoms. The treatment provided moderate relief.    History reviewed. No pertinent past medical history.  Past Surgical History  Procedure Date  . Right knee orif right patella Keeling 1993  . Knee surgery   . Left elbow   . Right foot   . Elbow surgery   . Foot surgery     Family History  Problem Relation Age of Onset  . Heart disease    . Arthritis    . Cancer    . Asthma    . Diabetes    . Kidney disease      History  Substance Use Topics  . Smoking status: Former Smoker    Types: Cigarettes  . Smokeless tobacco: Not on file  . Alcohol Use: Yes     occ      Review of Systems  Constitutional: Negative for fever.  HENT: Negative for congestion, sore throat and neck pain.   Eyes: Negative.  Negative for visual disturbance.  Respiratory: Negative for chest tightness and shortness of breath.     Cardiovascular: Negative for chest pain.  Gastrointestinal: Negative for nausea and abdominal pain.  Genitourinary: Negative.   Musculoskeletal: Positive for arthralgias. Negative for joint swelling.  Skin: Negative.  Negative for rash and wound.  Neurological: Negative for dizziness, weakness, light-headedness, numbness and headaches.  Hematological: Negative.   Psychiatric/Behavioral: Negative.     Allergies  Review of patient's allergies indicates no known allergies.  Home Medications   Current Outpatient Rx  Name Route Sig Dispense Refill  . CELECOXIB 100 MG PO CAPS Oral Take 100 mg by mouth 2 (two) times daily.      . ACETAMINOPHEN 500 MG PO TABS Oral Take 500 mg by mouth every 6 (six) hours as needed. For pain    . IBUPROFEN 200 MG PO TABS Oral Take 200 mg by mouth every 6 (six) hours as needed. For pain      BP 146/79  Pulse 84  Temp(Src) 98.9 F (37.2 C) (Oral)  Resp 20  Ht 5\' 11"  (1.803 m)  Wt 217 lb (98.431 kg)  BMI 30.27 kg/m2  SpO2 97%  Physical Exam  Nursing note and vitals reviewed. Constitutional: He is oriented to person, place, and time. He appears well-developed and well-nourished.  HENT:  Head: Normocephalic and atraumatic.  Eyes: EOM are normal. Pupils are equal,  round, and reactive to light.  Neck: Normal range of motion.  Cardiovascular: Normal rate and intact distal pulses.   Pulmonary/Chest: Effort normal. No respiratory distress.  Abdominal: Soft. He exhibits no distension.  Musculoskeletal: Normal range of motion. He exhibits edema and tenderness.       Right hand: He exhibits tenderness, bony tenderness and swelling. He exhibits normal two-point discrimination, normal capillary refill and no deformity. normal sensation noted. Normal strength noted.       Hands:      Modest edema noted dorsal index and 3rd fingers middle phalanx and pip joints.  Neurological: He is alert and oriented to person, place, and time.  Skin: Skin is warm and dry.   Psychiatric: He has a normal mood and affect.    ED Course  Procedures (including critical care time)  Labs Reviewed - No data to display Dg Wrist Complete Right  01/13/2011  *RADIOLOGY REPORT*  Clinical Data: Injury.  Fell from ladder  RIGHT WRIST - COMPLETE 3+ VIEW  Comparison: None  Findings: There is no evidence of fracture or dislocation.  Bone island is identified within the scaphoid bone.  There is no evidence of arthropathy or other focal bone abnormality.  Soft tissues are unremarkable.  IMPRESSION: No acute findings.  Original Report Authenticated By: Rosealee Albee, M.D.   Dg Hand Complete Right  01/13/2011  *RADIOLOGY REPORT*  Clinical Data: Injury  RIGHT HAND - COMPLETE 3+ VIEW  Comparison: None  Findings: There is no evidence of fracture or dislocation.  There is no evidence of arthropathy or other focal bone abnormality. Soft tissues are unremarkable.  IMPRESSION: No acute bony abnormalities.  Original Report Authenticated By: Rosealee Albee, M.D.     No diagnosis found.    MDM  No fracture on xray.  Exam consistent with abrasion and contusion.  Deep abrasion noted on volar wrist ,  Hemostatic,  Steri strips applied.  Abrasions clean,  Bacitracin applied,  Telfa,  Cling dressing.  Keflex prophy,  Hydrocodone for pain relief.        Candis Musa, PA 01/13/11 1453

## 2011-01-13 NOTE — ED Notes (Signed)
Pt a/ox4. Resp even and unlabored. NAD at this time. D/C instructions and Rx x2 reviewed with pt. Pt verbalized understanding. Pt ambulated with steady gate to POV.  

## 2011-01-13 NOTE — ED Provider Notes (Signed)
Medical screening examination/treatment/procedure(s) were performed by non-physician practitioner and as supervising physician I was immediately available for consultation/collaboration.  Nicholes Stairs, MD 01/13/11 [phone]

## 2011-01-13 NOTE — ED Notes (Signed)
Pt presents with right hand, wrist, and forearm pain. Pt states he fell off of bricks and landed on right arm. Pt states he cant move right index fever.

## 2011-01-13 NOTE — ED Notes (Signed)
Pt to radiology.

## 2011-05-23 ENCOUNTER — Ambulatory Visit (HOSPITAL_COMMUNITY)
Admission: RE | Admit: 2011-05-23 | Discharge: 2011-05-23 | Disposition: A | Discharge status: Home / Self Care | Payer: Medicaid Other | Source: Ambulatory Visit | Attending: Family Medicine | Admitting: Family Medicine

## 2011-05-23 ENCOUNTER — Other Ambulatory Visit (HOSPITAL_COMMUNITY): Payer: Self-pay | Admitting: Family Medicine

## 2011-05-23 DIAGNOSIS — M542 Cervicalgia: Secondary | ICD-10-CM | POA: Insufficient documentation

## 2012-04-30 ENCOUNTER — Emergency Department (HOSPITAL_COMMUNITY): Payer: Medicaid Other

## 2012-04-30 ENCOUNTER — Encounter (HOSPITAL_COMMUNITY): Payer: Self-pay | Admitting: *Deleted

## 2012-04-30 ENCOUNTER — Emergency Department (HOSPITAL_COMMUNITY)
Admission: EM | Admit: 2012-04-30 | Discharge: 2012-04-30 | Disposition: A | Discharge status: Home / Self Care | Payer: Medicaid Other | Attending: Emergency Medicine | Admitting: Emergency Medicine

## 2012-04-30 DIAGNOSIS — I10 Essential (primary) hypertension: Secondary | ICD-10-CM | POA: Insufficient documentation

## 2012-04-30 DIAGNOSIS — Z791 Long term (current) use of non-steroidal anti-inflammatories (NSAID): Secondary | ICD-10-CM | POA: Insufficient documentation

## 2012-04-30 DIAGNOSIS — S300XXA Contusion of lower back and pelvis, initial encounter: Secondary | ICD-10-CM

## 2012-04-30 DIAGNOSIS — Z87891 Personal history of nicotine dependence: Secondary | ICD-10-CM | POA: Insufficient documentation

## 2012-04-30 DIAGNOSIS — S139XXA Sprain of joints and ligaments of unspecified parts of neck, initial encounter: Secondary | ICD-10-CM | POA: Insufficient documentation

## 2012-04-30 DIAGNOSIS — Y929 Unspecified place or not applicable: Secondary | ICD-10-CM | POA: Insufficient documentation

## 2012-04-30 DIAGNOSIS — S161XXA Strain of muscle, fascia and tendon at neck level, initial encounter: Secondary | ICD-10-CM

## 2012-04-30 DIAGNOSIS — S20229A Contusion of unspecified back wall of thorax, initial encounter: Secondary | ICD-10-CM | POA: Insufficient documentation

## 2012-04-30 DIAGNOSIS — W19XXXA Unspecified fall, initial encounter: Secondary | ICD-10-CM

## 2012-04-30 DIAGNOSIS — W11XXXA Fall on and from ladder, initial encounter: Secondary | ICD-10-CM | POA: Insufficient documentation

## 2012-04-30 DIAGNOSIS — Y939 Activity, unspecified: Secondary | ICD-10-CM | POA: Insufficient documentation

## 2012-04-30 HISTORY — DX: Essential (primary) hypertension: I10

## 2012-04-30 LAB — URINALYSIS, ROUTINE W REFLEX MICROSCOPIC
Bilirubin Urine: NEGATIVE
Glucose, UA: NEGATIVE mg/dL
Ketones, ur: NEGATIVE mg/dL
Leukocytes, UA: NEGATIVE
Nitrite: NEGATIVE
Protein, ur: NEGATIVE mg/dL
Specific Gravity, Urine: 1.025 (ref 1.005–1.030)
Urobilinogen, UA: 1 mg/dL (ref 0.0–1.0)
pH: 6.5 (ref 5.0–8.0)

## 2012-04-30 LAB — URINE MICROSCOPIC-ADD ON

## 2012-04-30 MED ORDER — OXYCODONE-ACETAMINOPHEN 5-325 MG PO TABS: 1.0000 | ORAL_TABLET | ORAL | Status: AC | PRN

## 2012-04-30 MED ORDER — IBUPROFEN 800 MG PO TABS
800.0000 mg | ORAL_TABLET | Freq: Once | ORAL | Status: AC
  Administered 2012-04-30: 800 mg via ORAL
  Filled 2012-04-30: qty 1

## 2012-04-30 MED ORDER — OXYCODONE-ACETAMINOPHEN 5-325 MG PO TABS
1.0000 | ORAL_TABLET | Freq: Once | ORAL | Status: AC
  Administered 2012-04-30: 1 via ORAL
  Filled 2012-04-30: qty 1

## 2012-04-30 MED ORDER — NAPROXEN 500 MG PO TABS: 500.0000 mg | ORAL_TABLET | Freq: Two times a day (BID) | ORAL | Status: DC

## 2012-04-30 NOTE — ED Provider Notes (Signed)
History     CSN: [id number]  Arrival date & time 04/30/12  1536   First MD Initiated Contact with Patient 04/30/12 1804      Chief Complaint  Patient presents with  . Neck Pain    (Consider location/radiation/quality/duration/timing/severity/associated sxs/prior treatment) Patient is a 42 y.o. male presenting with neck pain. The history is provided by the patient.  Neck Pain   He fell off a ladder 2 days ago. He states was about 4 feet up a ladder and landed with his lower back and striking a 2 x 4. He is complaining of pain in his neck and his lower back. He rates pain at 8/10. It is worse with movement. Has been taking acetaminophen for pain with no relief. Nothing makes it better. He denies weakness, numbness, tingling. He denies bowel or bladder dysfunction. He relates that he had pre-existing neck problems and had been referred to a neurosurgeon for evaluation for surgery but had not actually made the appointment.  Past Medical History  Diagnosis Date  . Hypertension     Past Surgical History  Procedure Date  . Right knee orif right patella Keeling 1993  . Knee surgery   . Left elbow   . Right foot   . Elbow surgery   . Foot surgery   . Hernia repair     Family History  Problem Relation Age of Onset  . Heart disease    . Arthritis    . Cancer    . Asthma    . Diabetes    . Kidney disease      History  Substance Use Topics  . Smoking status: Former Smoker    Types: Cigarettes  . Smokeless tobacco: Not on file  . Alcohol Use: Yes     Comment: occ      Review of Systems  HENT: Positive for neck pain.   All other systems reviewed and are negative.    Allergies  Review of patient's allergies indicates no known allergies.  Home Medications   Current Outpatient Rx  Name  Route  Sig  Dispense  Refill  . ACETAMINOPHEN 500 MG PO TABS   Oral   Take 500 mg by mouth every 6 (six) hours as needed. For pain         . CELECOXIB 100 MG PO CAPS   Oral   Take 100 mg by mouth 2 (two) times daily.           . IBUPROFEN 200 MG PO TABS   Oral   Take 200 mg by mouth every 6 (six) hours as needed. For pain           BP 150/98  Pulse 61  Temp 98.1 F (36.7 C) (Oral)  Resp 20  Ht 5\' 11"  (1.803 m)  Wt 210 lb (95.255 kg)  BMI 29.29 kg/m2  SpO2 100%  Physical Exam  Nursing note and vitals reviewed.  42 year old male, resting comfortably and in no acute distress. Vital signs are significant for hypertension with blood pressure 150/90. Oxygen saturation is 100%, which is normal. Head is normocephalic and atraumatic. PERRLA, EOMI. Oropharynx is clear. Neck is moderately tender in the mid and upper cervical region without adenopathy or JVD. Back is is moderately tender throughout the lumbar region. There is no CVA tenderness. Straight leg raise is positive bilaterally at 30. Lungs are clear without rales, wheezes, or rhonchi. Chest is nontender. Heart has regular rate and rhythm without murmur. Abdomen is  soft, flat, nontender without masses or hepatosplenomegaly and peristalsis is normoactive. Extremities have no cyanosis or edema, full range of motion is present. Skin is warm and dry without rash. Neurologic: Mental status is normal, cranial nerves are intact, there are no motor or sensory deficits.  ED Course  Procedures (including critical care time)  Results for orders placed during the hospital encounter of 04/30/12  URINALYSIS, ROUTINE W REFLEX MICROSCOPIC      Component Value Range   Color, Urine YELLOW  YELLOW   APPearance CLEAR  CLEAR   Specific Gravity, Urine 1.025  1.005 - 1.030   pH 6.5  5.0 - 8.0   Glucose, UA NEGATIVE  NEGATIVE mg/dL   Hgb urine dipstick TRACE (*) NEGATIVE   Bilirubin Urine NEGATIVE  NEGATIVE   Ketones, ur NEGATIVE  NEGATIVE mg/dL   Protein, ur NEGATIVE  NEGATIVE mg/dL   Urobilinogen, UA 1.0  0.0 - 1.0 mg/dL   Nitrite NEGATIVE  NEGATIVE   Leukocytes, UA NEGATIVE  NEGATIVE  URINE  MICROSCOPIC-ADD ON      Component Value Range   RBC / HPF 0-2  <3 RBC/hpf   Dg Lumbar Spine Complete  04/30/2012  *RADIOLOGY REPORT*  Clinical Data: The history of trauma from a fall.  LUMBAR SPINE - COMPLETE 4+ VIEW  Comparison: CT of the abdomen and pelvis 02/06/2008.  Findings: Five views of the lumbar spine demonstrate no definite acute displaced fracture or compression type fractures.  Mild anterior wedging of T12 is likely physiologic and is unchanged compared to the sagittal reformats from CT scan 01/10/2008.  Mild multilevel degenerative disc disease is noted, most severe at L4- L5.  Multilevel facet arthropathy is also noted, most severe at L4- L5 and L5-S1.  No defects of the pars interarticularis are noted.  IMPRESSION: 1.  No acute radiographic abnormality of the lumbar spine. 2.  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   Original Report Authenticated By: Trudie Reed, M.D.    Ct Head Wo Contrast  04/30/2012  *RADIOLOGY REPORT*  Clinical Data:  History of fall with head and neck pain.  CT HEAD WITHOUT CONTRAST CT CERVICAL SPINE WITHOUT CONTRAST  Technique:  Multidetector CT imaging of the head and cervical spine was performed following the standard protocol without intravenous contrast.  Multiplanar CT image reconstructions of the cervical spine were also generated.  Comparison:  11/17/2007  CT HEAD  Findings: Negative for acute hemorrhage, mass lesion, midline shift, hydrocephalus or large infarct.  Normal appearance of the ventricles and basal cisterns.  There is mild mucosal thickening in the ethmoid air cells.  No acute bony abnormality.  IMPRESSION: Negative head CT.  CT CERVICAL SPINE  Findings: The lung apices are clear.  Negative for acute fracture or dislocation.  There is no evidence for soft tissue edema or swelling within the neck.  Normal alignment at the cervicothoracic junction.  IMPRESSION: Negative cervical spine CT.   Original Report  Authenticated By: Richarda Overlie, M.D.    Ct Cervical Spine Wo Contrast  04/30/2012  *RADIOLOGY REPORT*  Clinical Data:  History of fall with head and neck pain.  CT HEAD WITHOUT CONTRAST CT CERVICAL SPINE WITHOUT CONTRAST  Technique:  Multidetector CT imaging of the head and cervical spine was performed following the standard protocol without intravenous contrast.  Multiplanar CT image reconstructions of the cervical spine were also generated.  Comparison:  11/17/2007  CT HEAD  Findings: Negative for acute hemorrhage, mass lesion, midline shift, hydrocephalus or  large infarct.  Normal appearance of the ventricles and basal cisterns.  There is mild mucosal thickening in the ethmoid air cells.  No acute bony abnormality.  IMPRESSION: Negative head CT.  CT CERVICAL SPINE  Findings: The lung apices are clear.  Negative for acute fracture or dislocation.  There is no evidence for soft tissue edema or swelling within the neck.  Normal alignment at the cervicothoracic junction.  IMPRESSION: Negative cervical spine CT.   Original Report Authenticated By: Richarda Overlie, M.D.       1. Fall   2. Cervical strain   3. Lumbar contusion       MDM  Fall with neck and back injury. CT has been ordered a cervical spine and lumbar spine x-rays of been ordered.  X-rays and urinalysis are unremarkable. You'll be treated symptomatically with prescriptions given for naproxen and Percocet. He is supposed to see a neurosurgeon about his neck anyway needs to make an appointment for that evaluation.       Dione Booze, MD 04/30/12 Jerene Bears

## 2012-04-30 NOTE — ED Notes (Addendum)
Larey Seat off 3 ft ladder on Saturday, neck and back pain.  Low abd pain , onset Monday.  N/v.  Hurts to swallow.

## 2012-04-30 NOTE — ED Notes (Signed)
Discharge instructions given and reviewed with patient.  Prescriptions given for Percocet and Naproxen; effects and use explained for each.  Patient verbalized understanding of sedating effects of Percocet and to ensure that he follows up with neurosurgeon.  Patient ambulatory; discharged home in good condition.  Patient continues to rate pain as 8/10 in neck and states pain radiates to teeth.

## 2012-08-19 ENCOUNTER — Encounter (HOSPITAL_COMMUNITY): Payer: Self-pay

## 2012-08-19 ENCOUNTER — Emergency Department (HOSPITAL_COMMUNITY)
Admission: EM | Admit: 2012-08-19 | Discharge: 2012-08-19 | Disposition: A | Discharge status: Home / Self Care | Payer: Medicaid Other | Attending: Emergency Medicine | Admitting: Emergency Medicine

## 2012-08-19 DIAGNOSIS — T07XXXA Unspecified multiple injuries, initial encounter: Secondary | ICD-10-CM

## 2012-08-19 DIAGNOSIS — IMO0002 Reserved for concepts with insufficient information to code with codable children: Secondary | ICD-10-CM | POA: Insufficient documentation

## 2012-08-19 DIAGNOSIS — Y9289 Other specified places as the place of occurrence of the external cause: Secondary | ICD-10-CM | POA: Insufficient documentation

## 2012-08-19 DIAGNOSIS — S51809A Unspecified open wound of unspecified forearm, initial encounter: Secondary | ICD-10-CM | POA: Insufficient documentation

## 2012-08-19 DIAGNOSIS — Y9389 Activity, other specified: Secondary | ICD-10-CM | POA: Insufficient documentation

## 2012-08-19 DIAGNOSIS — Z87891 Personal history of nicotine dependence: Secondary | ICD-10-CM | POA: Insufficient documentation

## 2012-08-19 DIAGNOSIS — Z23 Encounter for immunization: Secondary | ICD-10-CM | POA: Insufficient documentation

## 2012-08-19 DIAGNOSIS — W268XXA Contact with other sharp object(s), not elsewhere classified, initial encounter: Secondary | ICD-10-CM | POA: Insufficient documentation

## 2012-08-19 DIAGNOSIS — I10 Essential (primary) hypertension: Secondary | ICD-10-CM | POA: Insufficient documentation

## 2012-08-19 MED ORDER — TETANUS-DIPHTH-ACELL PERTUSSIS 5-2.5-18.5 LF-MCG/0.5 IM SUSP
0.5000 mL | Freq: Once | INTRAMUSCULAR | Status: AC
  Administered 2012-08-19: 0.5 mL via INTRAMUSCULAR
  Filled 2012-08-19: qty 0.5

## 2012-08-19 MED ORDER — AMOXICILLIN-POT CLAVULANATE 875-125 MG PO TABS
1.0000 | ORAL_TABLET | Freq: Once | ORAL | Status: AC
  Administered 2012-08-19: 1 via ORAL
  Filled 2012-08-19: qty 1

## 2012-08-19 MED ORDER — AMOXICILLIN-POT CLAVULANATE 875-125 MG PO TABS: 1.0000 | ORAL_TABLET | Freq: Two times a day (BID) | ORAL | Status: DC

## 2012-08-19 MED ORDER — HYDROCODONE-ACETAMINOPHEN 7.5-325 MG PO TABS: 1.0000 | ORAL_TABLET | Freq: Four times a day (QID) | ORAL | Status: DC | PRN

## 2012-08-19 NOTE — ED Notes (Signed)
Pt injured right hand Friday of last week, "nail jabbed his hand" and has cuts from old barbed wire to arms, unsure of last tetanus

## 2012-08-19 NOTE — ED Provider Notes (Signed)
History     CSN: [id number]  Arrival date & time 08/19/12  1328   First MD Initiated Contact with Patient 08/19/12 1347      Chief Complaint  Patient presents with  . Hand Injury    (Consider location/radiation/quality/duration/timing/severity/associated sxs/prior treatment) HPI Comments: Patient c/o multiple scratches to his bilateral forearms and several small puncture wounds to the right hand for several days.  States he has been cleaning up old barbed wire.  He requests a tetanus shot.  He denies fever, red streaks, chills or swelling to his arms or hands.  Patient is a 42 y.o. male presenting with hand injury. The history is provided by the patient.  Hand Injury Location:  Hand Time since incident:  1 week Injury: yes   Hand location:  R hand Pain details:    Quality:  Throbbing   Radiates to:  Does not radiate   Severity:  Mild   Onset quality:  Sudden   Timing:  Constant   Progression:  Unchanged Chronicity:  New Handedness:  Right-handed Dislocation: no   Foreign body present:  No foreign bodies Tetanus status:  Out of date Prior injury to area:  Unable to specify Relieved by:  Nothing Worsened by:  Movement Ineffective treatments:  None tried Associated symptoms: no decreased range of motion, no fever, no muscle weakness, no neck pain, no numbness, no swelling and no tingling     Past Medical History  Diagnosis Date  . Hypertension     Past Surgical History  Procedure Laterality Date  . Right knee  orif right patella Keeling 1993  . Knee surgery    . Left elbow    . Right foot    . Elbow surgery    . Foot surgery    . Hernia repair      Family History  Problem Relation Age of Onset  . Heart disease    . Arthritis    . Cancer    . Asthma    . Diabetes    . Kidney disease      History  Substance Use Topics  . Smoking status: Former Smoker    Types: Cigarettes  . Smokeless tobacco: Not on file  . Alcohol Use: Yes     Comment: occ       Review of Systems  Constitutional: Negative for fever and chills.  HENT: Negative for neck pain.   Genitourinary: Negative for dysuria and difficulty urinating.  Musculoskeletal: Positive for joint swelling and arthralgias.  Skin: Positive for wound. Negative for color change.       Scratches and puncture wounds to the UE's  All other systems reviewed and are negative.    Allergies  Review of patient's allergies indicates no known allergies.  Home Medications   Current Outpatient Rx  Name  Route  Sig  Dispense  Refill  . acetaminophen (TYLENOL) 500 MG tablet   Oral   Take 1,000-1,500 mg by mouth every 6 (six) hours as needed for pain. For pain           BP 116/78  Pulse 74  Temp(Src) 98.2 F (36.8 C) (Oral)  Resp 18  Ht 5\' 11"  (1.803 m)  Wt 209 lb (94.802 kg)  BMI 29.16 kg/m2  SpO2 99%  Physical Exam  Nursing note and vitals reviewed. Constitutional: He is oriented to person, place, and time. He appears well-developed and well-nourished. No distress.  HENT:  Head: Normocephalic and atraumatic.  Cardiovascular: Normal rate, regular rhythm,  normal heart sounds and intact distal pulses.   No murmur heard. Pulmonary/Chest: Effort normal and breath sounds normal. No respiratory distress.  Musculoskeletal: Normal range of motion. He exhibits tenderness. He exhibits no edema.  Multiple superficial scratches to the bilateral forearms.  Three small puncture type wounds to the palmar surface of right hand.  No edema, red streaks.  Radial pulse brisk, distal sensation intact.  Pt has full ROM of the fingers.    Neurological: He is alert and oriented to person, place, and time. He exhibits normal muscle tone. Coordination normal.  Skin: Skin is warm and dry.  See ms exam    ED Course  Procedures (including critical care time)  Labs Reviewed - No data to display No results found.      MDM   Multiple abrasions/scratches to bilateral forearms with several  puncture wounds to the palmar surface of the right hand.  Mild STS of the right third finger.  No obvious cellulitis at present.  I have advised patient to return here in 1-2 days for recheck if his sx's worsen.  Moves all digits w/o difficulty, remains NV intact   I have given TDaP and will start pt on augmentin.  Wounds appear to be healing  The patient appears reasonably screened and/or stabilized for discharge and I doubt any other medical condition or other [facility] requiring further screening, evaluation, or treatment in the ED at this time prior to discharge.     Yasira Engelson L. Trisha Mangle, PA-C 08/22/12 1228
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
## 2012-08-22 NOTE — ED Provider Notes (Signed)
Medical screening examination/treatment/procedure(s) were performed by non-physician practitioner and as supervising physician I was immediately available for consultation/collaboration.  Brihany Butch, MD 08/22/12 1438 

## 2013-02-26 ENCOUNTER — Emergency Department (HOSPITAL_COMMUNITY): Payer: Medicaid Other

## 2013-02-26 ENCOUNTER — Emergency Department (HOSPITAL_COMMUNITY)
Admission: EM | Admit: 2013-02-26 | Discharge: 2013-02-26 | Disposition: A | Discharge status: Home / Self Care | Payer: Medicaid Other | Attending: Emergency Medicine | Admitting: Emergency Medicine

## 2013-02-26 ENCOUNTER — Encounter (HOSPITAL_COMMUNITY): Payer: Self-pay | Admitting: Emergency Medicine

## 2013-02-26 DIAGNOSIS — I10 Essential (primary) hypertension: Secondary | ICD-10-CM | POA: Insufficient documentation

## 2013-02-26 DIAGNOSIS — S0093XA Contusion of unspecified part of head, initial encounter: Secondary | ICD-10-CM

## 2013-02-26 DIAGNOSIS — S0003XA Contusion of scalp, initial encounter: Secondary | ICD-10-CM | POA: Insufficient documentation

## 2013-02-26 DIAGNOSIS — Z87891 Personal history of nicotine dependence: Secondary | ICD-10-CM | POA: Insufficient documentation

## 2013-02-26 DIAGNOSIS — H9319 Tinnitus, unspecified ear: Secondary | ICD-10-CM | POA: Insufficient documentation

## 2013-02-26 MED ORDER — IBUPROFEN 800 MG PO TABS: 800.0000 mg | ORAL_TABLET | Freq: Three times a day (TID) | ORAL | Status: DC

## 2013-02-26 MED ORDER — OXYCODONE-ACETAMINOPHEN 5-325 MG PO TABS
1.0000 | ORAL_TABLET | Freq: Once | ORAL | Status: AC
  Administered 2013-02-26: 1 via ORAL
  Filled 2013-02-26: qty 1

## 2013-02-26 NOTE — ED Notes (Signed)
Pt alert & oriented x4, stable gait. Patient given discharge instructions, paperwork & prescription(s). Patient  instructed to stop at the registration desk to finish any additional paperwork. Patient verbalized understanding. Pt left department w/ no further questions. 
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
## 2013-02-26 NOTE — ED Notes (Signed)
Pt instructed CT down and his test will take place when CT is back up.
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
## 2013-06-10 ENCOUNTER — Ambulatory Visit: Payer: Medicaid Other | Admitting: Orthopedic Surgery

## 2013-06-14 ENCOUNTER — Emergency Department (HOSPITAL_COMMUNITY): Payer: Medicaid Other

## 2013-06-14 ENCOUNTER — Emergency Department (HOSPITAL_COMMUNITY)
Admission: EM | Admit: 2013-06-14 | Discharge: 2013-06-14 | Disposition: A | Discharge status: Home / Self Care | Payer: Medicaid Other | Attending: Emergency Medicine | Admitting: Emergency Medicine

## 2013-06-14 ENCOUNTER — Encounter (HOSPITAL_COMMUNITY): Payer: Self-pay | Admitting: Emergency Medicine

## 2013-06-14 DIAGNOSIS — W11XXXA Fall on and from ladder, initial encounter: Secondary | ICD-10-CM | POA: Insufficient documentation

## 2013-06-14 DIAGNOSIS — I10 Essential (primary) hypertension: Secondary | ICD-10-CM | POA: Insufficient documentation

## 2013-06-14 DIAGNOSIS — Z9889 Other specified postprocedural states: Secondary | ICD-10-CM | POA: Insufficient documentation

## 2013-06-14 DIAGNOSIS — S199XXA Unspecified injury of neck, initial encounter: Secondary | ICD-10-CM

## 2013-06-14 DIAGNOSIS — S4980XA Other specified injuries of shoulder and upper arm, unspecified arm, initial encounter: Secondary | ICD-10-CM | POA: Insufficient documentation

## 2013-06-14 DIAGNOSIS — S0993XA Unspecified injury of face, initial encounter: Secondary | ICD-10-CM | POA: Insufficient documentation

## 2013-06-14 DIAGNOSIS — S46909A Unspecified injury of unspecified muscle, fascia and tendon at shoulder and upper arm level, unspecified arm, initial encounter: Secondary | ICD-10-CM | POA: Insufficient documentation

## 2013-06-14 DIAGNOSIS — Z87891 Personal history of nicotine dependence: Secondary | ICD-10-CM | POA: Insufficient documentation

## 2013-06-14 DIAGNOSIS — M25511 Pain in right shoulder: Secondary | ICD-10-CM

## 2013-06-14 DIAGNOSIS — M542 Cervicalgia: Secondary | ICD-10-CM

## 2013-06-14 DIAGNOSIS — Y929 Unspecified place or not applicable: Secondary | ICD-10-CM | POA: Insufficient documentation

## 2013-06-14 DIAGNOSIS — Y939 Activity, unspecified: Secondary | ICD-10-CM | POA: Insufficient documentation

## 2013-06-14 MED ORDER — OXYCODONE-ACETAMINOPHEN 5-325 MG PO TABS: 1.0000 | ORAL_TABLET | Freq: Four times a day (QID) | ORAL | Status: DC | PRN

## 2013-06-14 MED ORDER — OXYCODONE-ACETAMINOPHEN 5-325 MG PO TABS
1.0000 | ORAL_TABLET | Freq: Once | ORAL | Status: AC
  Administered 2013-06-14: 1 via ORAL
  Filled 2013-06-14: qty 1

## 2013-06-14 MED ORDER — IBUPROFEN 600 MG PO TABS: 600.0000 mg | ORAL_TABLET | Freq: Four times a day (QID) | ORAL | Status: DC | PRN

## 2013-06-14 NOTE — ED Provider Notes (Signed)
CSN: [id number]     Arrival date & time 06/14/13  1438 History   First MD Initiated Contact with Patient 06/14/13 1457     No chief complaint on file.    (Consider location/radiation/quality/duration/timing/severity/associated sxs/prior Treatment) HPI Comments: Patient is a 43 year old male past medical history significant for hypertension presented to the emergency department for right shoulder and right-sided neck pain that began yesterday morning after patient slipped and fell off a ladder. Patient states he was approximately 5-6 steps up when he lost his footing he should ice causing him to land on the ground on his right shoulder. He denies hitting his head, losing consciousness, emesis, headache. Patient states his pain is a 6/10. He states his shoulder pain is worsened with elevation above his head. He states Bio-Freeze has helped minimally improved his right-sided neck pain. Denies any aggravating factors for his neck pain. Patient denies any chest pain, abdominal pain, back pain.   Past Medical History  Diagnosis Date  . Hypertension    Past Surgical History  Procedure Laterality Date  . Right knee  orif right patella Keeling 1993  . Knee surgery    . Left elbow    . Right foot    . Elbow surgery    . Foot surgery    . Hernia repair     Family History  Problem Relation Age of Onset  . Heart disease    . Arthritis    . Cancer    . Asthma    . Diabetes    . Kidney disease     History  Substance Use Topics  . Smoking status: Former Smoker    Types: Cigarettes  . Smokeless tobacco: Not on file  . Alcohol Use: Yes     Comment: occ    Review of Systems  Constitutional: Negative for fever and chills.  Respiratory: Negative for shortness of breath.   Cardiovascular: Negative for chest pain.  Gastrointestinal: Negative for nausea and vomiting.  Musculoskeletal: Positive for arthralgias, myalgias and neck pain. Negative for back pain, gait problem, joint swelling and  neck stiffness.  Skin: Negative for color change and wound.  Neurological: Negative for syncope, weakness, numbness and headaches.  All other systems reviewed and are negative.      Allergies  Review of patient's allergies indicates no known allergies.  Home Medications   Current Outpatient Rx  Name  Route  Sig  Dispense  Refill  . cyclobenzaprine (FLEXERIL) 10 MG tablet   Oral   Take 10 mg by mouth 3 (three) times daily as needed for muscle spasms.         . diazepam (VALIUM) 5 MG tablet   Oral   Take 5 mg by mouth at bedtime as needed and may repeat dose one time if needed for anxiety (and/or for sleep).         Marland Kitchen ibuprofen (ADVIL,MOTRIN) 200 MG tablet   Oral   Take 400 mg by mouth every 6 (six) hours as needed for headache or moderate pain.         Marland Kitchen ibuprofen (ADVIL,MOTRIN) 600 MG tablet   Oral   Take 1 tablet (600 mg total) by mouth every 6 (six) hours as needed.   30 tablet   0   . oxyCODONE-acetaminophen (PERCOCET/ROXICET) 5-325 MG per tablet   Oral   Take 1-2 tablets by mouth every 6 (six) hours as needed for severe pain.   20 tablet   0    BP 150/86  Pulse 61  Temp(Src) 98.4 F (36.9 C) (Oral)  Resp 18  Ht 5\' 11"  (1.803 m)  Wt 223 lb (101.152 kg)  BMI 31.12 kg/m2  SpO2 100% Physical Exam  Nursing note and vitals reviewed. Constitutional: He is oriented to person, place, and time. He appears well-developed and well-nourished. No distress.  HENT:  Head: Normocephalic and atraumatic.  Right Ear: External ear normal.  Left Ear: External ear normal.  Nose: Nose normal.  Mouth/Throat: Oropharynx is clear and moist. No oropharyngeal exudate.  Eyes: Conjunctivae and EOM are normal. Pupils are equal, round, and reactive to light.  Neck: Normal range of motion. Neck supple. Muscular tenderness present. No spinous process tenderness present.    Cardiovascular: Normal rate, regular rhythm, normal heart sounds and intact distal pulses.     Pulmonary/Chest: Effort normal and breath sounds normal. No respiratory distress.  Abdominal: Soft. There is no tenderness.  Musculoskeletal:       Right shoulder: He exhibits tenderness. He exhibits normal range of motion, no bony tenderness, no swelling, no effusion, no crepitus, no deformity, no laceration, no pain, no spasm, normal pulse and normal strength.       Left shoulder: Normal.       Right elbow: Normal.      Right wrist: Normal.       Cervical back: He exhibits tenderness. He exhibits normal range of motion, no bony tenderness, no swelling, no edema, no deformity, no laceration, no pain, no spasm and normal pulse.       Thoracic back: Normal.       Lumbar back: Normal.       Right upper arm: Normal.       Left upper arm: Normal.       Right forearm: Normal.       Left forearm: Normal.       Right hand: Normal.       Left hand: Normal.  Pain elicited with ROM, but ROM is intact.   Neurological: He is alert and oriented to person, place, and time. He has normal strength. No cranial nerve deficit or sensory deficit. Gait normal. GCS eye subscore is 4. GCS verbal subscore is 5. GCS motor subscore is 6.  No pronator drift. Bilateral heel-knee-shin intact.  Skin: Skin is warm and dry. He is not diaphoretic.  No bruising noted.    ED Course  Procedures (including critical care time) Medications  oxyCODONE-acetaminophen (PERCOCET/ROXICET) 5-325 MG per tablet 1 tablet (1 tablet Oral Given 06/14/13 1539)    Labs Review Labs Reviewed - No data to display Imaging Review Dg Cervical Spine Complete  06/14/2013   CLINICAL DATA:  Fall, neck pain.  EXAM: CERVICAL SPINE  4+ VIEWS  COMPARISON:  CT 04/30/2012  FINDINGS: There is no evidence of cervical spine fracture or prevertebral soft tissue swelling. Alignment is normal. No other significant bone abnormalities are identified.  IMPRESSION: Negative cervical spine radiographs.   Electronically Signed   By: Rolm Baptise M.D.   On:  06/14/2013 15:26   Dg Shoulder Right  06/14/2013   CLINICAL DATA:  Fall from ladder.  Right shoulder pain.  EXAM: RIGHT SHOULDER - 2+ VIEW  COMPARISON:  None.  FINDINGS: Mild degenerative spurring along the inferior aspect of the glenoid. No acute bony abnormality. Specifically, no fracture, subluxation, or dislocation. Soft tissues are intact.  IMPRESSION: No acute bony abnormality.   Electronically Signed   By: Rolm Baptise M.D.   On: 06/14/2013 15:12  EKG Interpretation None      MDM   Final diagnoses:  Right shoulder pain  Neck pain on right side  Fall from ladder    Filed Vitals:   06/14/13 1448  BP: 150/86  Pulse: 61  Temp: 98.4 F (36.9 C)  Resp: 18    Afebrile, NAD, non-toxic appearing, AAOx4.   1) Shoulder Pain: PE shows no instability, tenderness, or deformity of acromioclavicular and sternoclavicular joints, the cervical spine, glenohumeral joint, coracoid process, acromion, or scapula. Good shoulder strength during empty can test. Good ROM during scratch test. No signs of impingement on Adson's manuever. X-ray negative.   2) neck pain: Patient with right sided cervical pain.  No neurological deficits and normal neuro exam.  Patient has full ROM in cervical spine. No spinous process tenderness.  X-ray negative for acute cervical process. Pain likely muscular in nature.   RICE method and pain medication indicated. Return precautions discussed. Advised PCP f/u. Patient is agreeable to plan. Patient is stable at time of discharge. Patient d/w with Dr. Stevie Kern, agrees with plan.      Harlow Mares, PA-C 06/14/13 1610

## 2013-06-14 NOTE — ED Notes (Signed)
Fell yesterday approximately 5 feet off ladder.  C/o pain right shoulder and right ear and neck pain.

## 2013-06-14 NOTE — Discharge Instructions (Signed)
Please follow up with your primary care physician in 1 week for recheck of your shoulder and neck pain. If you do not have one please call the [facility] number listed above. Please take pain medication as prescribed and as needed for pain. Please do not drive on narcotic pain medication. Please take Motrin as prescribed. Please use ice or heat to help with pain as well. Please read all discharge instructions and return precautions.   Shoulder Pain The shoulder is the joint that connects your arms to your body. The bones that form the shoulder joint include the upper arm bone (humerus), the shoulder blade (scapula), and the collarbone (clavicle). The top of the humerus is shaped like a ball and fits into a rather flat socket on the scapula (glenoid cavity). A combination of muscles and strong, fibrous tissues that connect muscles to bones (tendons) support your shoulder joint and hold the ball in the socket. Small, fluid-filled sacs (bursae) are located in different areas of the joint. They act as cushions between the bones and the overlying soft tissues and help reduce friction between the gliding tendons and the bone as you move your arm. Your shoulder joint allows a wide range of motion in your arm. This range of motion allows you to do things like scratch your back or throw a ball. However, this range of motion also makes your shoulder more prone to pain from overuse and injury. Causes of shoulder pain can originate from both injury and overuse and usually can be grouped in the following four categories:  Redness, swelling, and pain (inflammation) of the tendon (tendinitis) or the bursae (bursitis).  Instability, such as a dislocation of the joint.  Inflammation of the joint (arthritis).  Broken bone (fracture). HOME CARE INSTRUCTIONS   Apply ice to the sore area.  Put ice in a plastic bag.  Place a towel between your skin and the bag.  Leave the ice on for 15-20  minutes, 03-04 times per day for the first 2 days.  Stop using cold packs if they do not help with the pain.  If you have a shoulder sling or immobilizer, wear it as long as your caregiver instructs. Only remove it to shower or bathe. Move your arm as little as possible, but keep your hand moving to prevent swelling.  Squeeze a soft ball or foam pad as much as possible to help prevent swelling.  Only take over-the-counter or prescription medicines for pain, discomfort, or fever as directed by your caregiver. SEEK MEDICAL CARE IF:   Your shoulder pain increases, or new pain develops in your arm, hand, or fingers.  Your hand or fingers become cold and numb.  Your pain is not relieved with medicines. SEEK IMMEDIATE MEDICAL CARE IF:   Your arm, hand, or fingers are numb or tingling.  Your arm, hand, or fingers are significantly swollen or turn white or blue. MAKE SURE YOU:   Understand these instructions.  Will watch your condition.  Will get help right away if you are not doing well or get worse. Document Released: 01/04/2005 Document Revised: 12/20/2011 Document Reviewed: 03/11/2011 [facility] Patient Information 2014 [place].

## 2013-06-15 NOTE — ED Provider Notes (Signed)
Medical screening examination/treatment/procedure(s) were performed by non-physician practitioner and as supervising physician I was immediately available for consultation/collaboration.   Makeshia Seat M Ketty Bitton, MD 06/15/13 1333 

## 2013-06-18 ENCOUNTER — Ambulatory Visit: Payer: Medicaid Other | Admitting: Orthopedic Surgery

## 2013-06-18 ENCOUNTER — Encounter: Payer: Self-pay | Admitting: Orthopedic Surgery

## 2013-06-18 ENCOUNTER — Ambulatory Visit (INDEPENDENT_AMBULATORY_CARE_PROVIDER_SITE_OTHER): Payer: Medicaid Other | Admitting: Orthopedic Surgery

## 2013-06-18 VITALS — BP 146/82 | Ht 71.0 in | Wt 225.0 lb

## 2013-06-18 DIAGNOSIS — G5600 Carpal tunnel syndrome, unspecified upper limb: Secondary | ICD-10-CM

## 2013-06-18 NOTE — Progress Notes (Signed)
Patient ID: Jamie [name], male   DOB: [birth year], 43 y.o.   MRN: [id number]  Chief Complaint  Patient presents with  . Hand Pain    Bilateral carpal tunnel syndrome. Consult from Dr. Anastasio Champion    43 year old male presents for evaluation of bilateral wrist and hand pain associated with numbness and tingling.  History of depression benign essential hypertension bipolar disorder cervicalgia anxiety. He has already had nerve studies showing moderate carpal tunnel syndrome and he is oriented and treated with splints. His pain and symptoms of numbness tingling weakness and dropping small objects when he tries to pick them up have been present for at least 4 years she's 1 splints on and off for 2 years and consistently over the last 2 months  Previous medical history includes right knee surgery left elbow surgery family history of heart disease cancer asthma and diabetes currently disabled stopped smoking 2 months ago doesn't drink current medications are fluoxetine 20 mg lorazepam 1 mg ibuprofen 600 mg oxycodone 5 mg with the last 2 medications taken after he fell off a ladder a few weeks ago x-rays were negative  Review of systems chest pain snoring depression otherwise negative  Bilateral hand examination BP 146/82  Ht 5\' 11"  (1.803 m)  Wt 225 lb (102.059 kg)  BMI 31.39 kg/m2 General appearance is normal, the patient is alert and oriented x3 with normal mood and affect.  No swelling in the hand she has full range of motion he has small nodes in the small digits of the fingers his grip strength is normal skin is intact there are some open areas from scratching. Wrist joints are stable. He has decreased sensation on the right hand compared to the left to sharp touch and soft touch pulses in the radial and ulnar arteries are normal lymph nodes are negative  Nerve conduction study is positive bilaterally  Right is worse than left  Symptoms in the right or worsen left  He is interested in more  definitive treatment. However, as advised he has a slightly increased risk of incomplete was resolution of symptoms based on the long history of symptoms of greater than 4 years however, we will go ahead and  Recommend carpal tunnel release right wrist

## 2013-06-18 NOTE — Patient Instructions (Signed)
Will have right carpal tunnel release   Carpal Tunnel Syndrome The carpal tunnel is a narrow area located on the palm side of your wrist. The tunnel is formed by the wrist bones and ligaments. Nerves, blood vessels, and tendons pass through the carpal tunnel. Repeated wrist motion or certain diseases may cause swelling within the tunnel. This swelling pinches the main nerve in the wrist (median nerve) and causes the painful hand and arm condition called carpal tunnel syndrome. CAUSES   Repeated wrist motions.  Wrist injuries.  Certain diseases like arthritis, diabetes, alcoholism, hyperthyroidism, and kidney failure.  Obesity.  Pregnancy. SYMPTOMS   A "pins and needles" feeling in your fingers or hand.  Tingling or numbness in your fingers or hand.  An aching feeling in your entire arm.  Wrist pain that goes up your arm to your shoulder.  Pain that goes down into your palm or fingers.  A weak feeling in your hands. DIAGNOSIS  Your caregiver will take your history and perform a physical exam. An electromyography test may be needed. This test measures electrical signals sent out by the muscles. The electrical signals are usually slowed by carpal tunnel syndrome. You may also need X-rays. TREATMENT  Carpal tunnel syndrome may clear up by itself. Your caregiver may recommend a wrist splint or medicine such as a nonsteroidal anti-inflammatory medicine. Cortisone injections may help. Sometimes, surgery may be needed to free the pinched nerve.  HOME CARE INSTRUCTIONS   Take all medicine as directed by your caregiver. Only take over-the-counter or prescription medicines for pain, discomfort, or fever as directed by your caregiver.  If you were given a splint to keep your wrist from bending, wear it as directed. It is important to wear the splint at night. Wear the splint for as long as you have pain or numbness in your hand, arm, or wrist. This may take 1 to 2 months.  Rest your wrist  from any activity that may be causing your pain. If your symptoms are work-related, you may need to talk to your employer about changing to a job that does not require using your wrist.  Put ice on your wrist after long periods of wrist activity.  Put ice in a plastic bag.  Place a towel between your skin and the bag.  Leave the ice on for 15-20 minutes, 03-04 times a day.  Keep all follow-up visits as directed by your caregiver. This includes any orthopedic referrals, physical therapy, and rehabilitation. Any delay in getting necessary care could result in a delay or failure of your condition to heal. SEEK IMMEDIATE MEDICAL CARE IF:   You have new, unexplained symptoms.  Your symptoms get worse and are not helped or controlled with medicines. MAKE SURE YOU:   Understand these instructions.  Will watch your condition.  Will get help right away if you are not doing well or get worse. Document Released: 03/24/2000 Document Revised: 06/19/2011 Document Reviewed: 02/10/2011 [facility] Patient Information 2014 [place], [place]. Carpal Tunnel Surgery The carpal tunnel is a narrow hollow area in the wrist. It is formed by the wrist bones and ligaments. Nerves, blood vessels, and tendons on the palm side of your hand pass through the carpal tunnel. (The palm side is the side of your hand in the direction your fingers bend.) Repeated wrist motion or certain diseases may cause swelling within the tunnel. That is why these are sometimes called repetitive trauma disorders. It is also a common problem in late pregnancy because of  water retention. This swelling pinches the main nerve in the wrist (median nerve). It causes the painful condition called carpal tunnel syndrome. A feeling of "pins and needles" may be noticed in the fingers or hand. The entire arm may ache from this condition. Carpal tunnel syndrome may clear up by itself. Cortisone injections may help. An electromyogram may be needed to confirm  this diagnosis. This is a test which measures nerve conduction. The nerve conduction is usually slowed in a carpal tunnel syndrome. Sometimes, an operation may be needed to free the pinched nerve.  LET YOUR CAREGIVER KNOW ABOUT:  Allergies  Medications taken including herbs, eye drops, over the counter medications, and creams  Use of steroids (by mouth or creams)  Previous problems with anesthetics or novocaine  Possibility of pregnancy, if this applies  History of blood clots (thrombophlebitis)  History of bleeding or blood problems  Previous surgery  Other health problems RISKS AND COMPLICATIONS  Infection: A germ starts growing in the wound. This can usually be treated with antibiotics.  Damage to other organs may occur.  Bleeding following surgery can be a complication of almost all surgeries. Your surgeon takes every precaution to keep this from happening.  Recurrence (return) of carpal tunnel syndrome following treatment is rare. BEFORE THE PROCEDURE  Stop smoking at least two weeks prior to surgery. This lowers risk during surgery.  Stop non steroidal medicine for ten days prior to surgery. Also, do not take aspirin unless OK'd by your surgeon.  Your caregiver may tell you to stop taking certain medicine that may affect the outcome of the surgery and your ability to heal. For example, you may need to stop taking anti-inflammatories, such as aspirin, because of possible bleeding problems. Other medicine may have interactions with anesthesia.  BE SURE TO LET YOUR CAREGIVER KNOW IF YOU HAVE BEEN ON STEROIDS (INCLUDING CREAMS) FOR LONG PERIODS OF TIME. THIS IS CRITICAL.  Your caregiver will discuss possible risks and complications with you before surgery. In addition to the usual risks of anesthesia, other common risks and complications include:  Temporary increase in pain due to surgery.  Uncorrected pain.  Infection. You should be present 60 minutes before your  procedure or as directed.  PROCEDURE  Carpal tunnel release is generally recommended if symptoms last for 6 months. Surgery involves severing the band of tissue around the wrist to reduce pressure on the median nerve. Surgery is done under local anesthesia and does not require an overnight hospital stay. Many patients require surgery on both hands. The following are types of carpal tunnel release surgery:  Open release surgery, the traditional procedure used to correct carpal tunnel syndrome, consists of making an incision up to 2 inches in the wrist and then cutting the carpal ligament to enlarge the carpal tunnel. The procedure is generally done under local anesthesia on an outpatient basis, unless there are unusual medical considerations.  Endoscopic surgery may allow faster functional recovery and less post-operative discomfort than traditional open release surgery. The surgeon makes two incisions (cuts) (about 1/2 inch each) in the wrist and palm, inserts a camera attached to a tube, looks at the tissue on a screen, and cuts the carpal ligament (the tissue that holds joints together). This two-portal endoscopic surgery, generally performed under local anesthesia, is effective and minimizes scarring and scar tenderness, if any. One-portal endoscopic surgery for carpal tunnel syndrome is also available. Although symptoms may be better right after surgery, full recovery from carpal tunnel surgery can take  months. Some patients may have infection, nerve damage, stiffness, and pain at the scar. Sometimes the wrist loses strength because the carpal ligament is cut. Patients should take part in physical therapy after surgery to restore wrist strength. Some patients may need to adjust job duties or even change jobs after recovery from surgery. The majority of patients recover completely without complications (additional problems). AFTER THE PROCEDURE After surgery, you will be taken to the recovery area  where a nurse will watch and check your progress. Once you're awake, stable, and taking fluids well, without other problems you will be allowed to go home. HOME CARE INSTRUCTIONS   Once at home, an ice pack applied to your operative site may help with discomfort and keep the swelling down.  Follow your caregiver's instructions as to activities, exercises, physical therapy, and driving a car.  Maintain strength and range of motion as instructed.  If you were given a splint to keep your wrist from bending, use it as instructed. It is important to wear the splint at night. Use the splint for as long as you have pain or numbness in your hand, arm or wrist. This may take 1 to 2 months.  If you have pain at night, it may help to elevate your hand above the level of your heart (the center of your chest).  It is important to avoid activities which originally caused your carpal tunnel syndrome for a couple weeks following surgery, or as directed by your surgeon. If your symptoms are work-related, you may need to talk to your employer about changing to a job that does not require using your wrist.  Only take over-the-counter or prescription medicines for pain, discomfort, or fever as directed by your caregiver.  Following periods of extended use, particularly hard (strenuous) use, apply an ice pack wrapped in a towel to the palm (anterior) side of the affected wrist for 20 to 30 minutes. Repeat as needed three to four times per day. This will help reduce swelling following surgery. SEEK MEDICAL CARE IF:   There is increased bleeding (more than a small spot) from the wound.  You notice redness, swelling, or increasing pain in the wound.  Pus is coming from wound.  An unexplained oral temperature above 102 F (38.9 C) develops.  You notice a foul smell coming from the wound or dressing. SEEK IMMEDIATE MEDICAL CARE IF:   You develop a rash.  You have difficulty breathing.  You have any  problems you think are related to allergies. Document Released: 11/09/2003 Document Revised: 06/19/2011 Document Reviewed: 01/31/2007 [facility] Patient Information 2014 [place].

## 2013-06-19 ENCOUNTER — Other Ambulatory Visit: Payer: Self-pay | Admitting: *Deleted

## 2013-06-19 ENCOUNTER — Telehealth: Payer: Self-pay | Admitting: Orthopedic Surgery

## 2013-06-19 NOTE — Telephone Encounter (Signed)
Per phone conversation with [facility] [employer] [phone], no preauthorization needed for outpatient CPT [phone]. Reference # [id number]

## 2013-06-23 ENCOUNTER — Encounter (HOSPITAL_COMMUNITY): Payer: Self-pay | Admitting: Pharmacy Technician

## 2013-06-23 ENCOUNTER — Encounter (HOSPITAL_COMMUNITY): Payer: Self-pay

## 2013-06-23 ENCOUNTER — Other Ambulatory Visit: Payer: Self-pay

## 2013-06-23 ENCOUNTER — Encounter (HOSPITAL_COMMUNITY)
Admission: RE | Admit: 2013-06-23 | Discharge: 2013-06-23 | Disposition: A | Discharge status: Home / Self Care | Payer: Medicaid Other | Source: Ambulatory Visit | Attending: Orthopedic Surgery | Admitting: Orthopedic Surgery

## 2013-06-23 LAB — HEMOGLOBIN AND HEMATOCRIT, BLOOD
HCT: 41.2 % (ref 39.0–52.0)
Hemoglobin: 14.8 g/dL (ref 13.0–17.0)

## 2013-06-23 LAB — BASIC METABOLIC PANEL
BUN: 25 mg/dL — ABNORMAL HIGH (ref 6–23)
CO2: 26 mEq/L (ref 19–32)
Calcium: 9 mg/dL (ref 8.4–10.5)
Chloride: 104 mEq/L (ref 96–112)
Creatinine, Ser: 1.21 mg/dL (ref 0.50–1.35)
GFR calc Af Amer: 83 mL/min — ABNORMAL LOW (ref >90)
GFR calc non Af Amer: 71 mL/min — ABNORMAL LOW (ref >90)
Glucose, Bld: 107 mg/dL — ABNORMAL HIGH (ref 70–99)
Potassium: 4.3 mEq/L (ref 3.7–5.3)
Sodium: 141 mEq/L (ref 137–147)

## 2013-06-23 NOTE — Patient Instructions (Signed)
Your procedure is scheduled on: 06/25/13  Report to [facility] at   7:30  AM.  Call this number if you have problems the morning of surgery: [phone]   Remember:   Do not drink or eat food:After Midnight.  :  Take these medicines the morning of surgery with A SIP OF WATER:    Do not wear jewelry, make-up or nail polish.  Do not wear lotions, powders, or perfumes. You may wear deodorant.  Do not shave 48 hours prior to surgery. Men may shave face and neck.  Do not bring valuables to the hospital.  Contacts, dentures or bridgework may not be worn into surgery.  Leave suitcase in the car. After surgery it may be brought to your room.  For patients admitted to the hospital, checkout time is 11:00 AM the day of discharge.   Patients discharged the day of surgery will not be allowed to drive home.    Special Instructions: Shower using CHG night before surgery and shower the day of surgery use CHG.  Use special wash - you have one bottle of CHG for all showers.  You should use approximately 1/2 of the bottle for each shower.   Please read over the following fact sheets that you were given: Pain Booklet, MRSA Information, Surgical Site Infection Prevention and Care and Recovery After Surgery  Carpal Tunnel Release (Repair), Care After Refer to this sheet in the next few weeks. These discharge instructions provide you with general information on caring for yourself after you leave the hospital. Your caregiver may also give you specific instructions. Your treatment has been planned according to the most current medical practices available, but unavoidable complications sometimes occur. If you have any problems or questions after discharge, please call your caregiver. HOME CARE INSTRUCTIONS   Have a responsible person with you for 24 hours.  Do not drive a car or take public transportation for 24 hours.  Only take over-the-counter or prescription medicines for pain, discomfort, or fever as  directed by your caregiver. Take them as directed.  You may put ice on the palm side of the affected wrist.  Put ice in a plastic bag.  Place a towel between your skin and the bag.  Leave the ice on for 15-20 minutes, 03-04 times per day.  If you were given a splint to keep your wrist from bending, use it as directed. It is important to wear the splint at night or as directed. Use the splint for as long as you have pain or numbness in your hand, arm, or wrist. This may take 1 to 2 months.  Keep your hand raised (elevated) above the level of your heart as much as possible. This keeps swelling down and helps with discomfort.  Change bandages (dressings) as directed.  Keep the wound clean and dry. SEEK MEDICAL CARE IF:   You develop pain not relieved with medicines.  You develop numbness of your hand.  You develop bleeding from your surgical site.  You have an oral temperature above 102 F (38.9 C).  You develop redness or swelling of the surgical site.  You develop new, unexplained problems. SEEK IMMEDIATE MEDICAL CARE IF:   You develop a rash.  You have difficulty breathing.  You develop any reaction or side effects to medicines given. MAKE SURE YOU:   Understand these instructions.  Will watch your condition.  Will get help right away if you are not doing well or get worse. Document Released: 10/14/2004  Document Revised: 01/15/2013 Document Reviewed: 01/31/2007 [facility] Patient Information 2014 [place], [place].

## 2013-06-24 NOTE — H&P (Signed)
  Chief Complaint   Patient presents with   .  Hand Pain       Bilateral carpal tunnel syndrome. Consult from Dr. Anastasio Champion     43 year old male presents for evaluation of bilateral wrist and hand pain associated with numbness and tingling.  History of depression benign essential hypertension bipolar disorder cervicalgia anxiety. He has already had nerve studies showing moderate carpal tunnel syndrome and he is oriented and treated with splints. His pain and symptoms of numbness tingling weakness and dropping small objects when he tries to pick them up have been present for at least 4 years she's 1 splints on and off for 2 years and consistently over the last 2 months  Previous medical history includes right knee surgery left elbow surgery family history of heart disease cancer asthma and diabetes currently disabled stopped smoking 2 months ago doesn't drink current medications are fluoxetine 20 mg lorazepam 1 mg ibuprofen 600 mg oxycodone 5 mg with the last 2 medications taken after he fell off a ladder a few weeks ago x-rays were negative  Review of systems chest pain snoring depression otherwise negative  Bilateral hand examination BP 146/82  Ht 5\' 11"  (1.803 m)  Wt 225 lb (102.059 kg)  BMI 31.39 kg/m2 General appearance is normal, the patient is alert and oriented x3 with normal mood and affect.  No swelling in the hand he has full range of motion he has small nodes in the small digits of the fingers his grip strength is normal skin is intact there are some open areas from scratching. Wrist joints are stable. He has decreased sensation on the right hand compared to the left to sharp touch and soft touch pulses in the radial and ulnar arteries are normal lymph nodes are negative  Nerve conduction study is positive bilaterally  Right is worse than left  Symptoms in the right or worsen left  He is interested in more definitive treatment. However, as advised he has a slightly increased  risk of incomplete was resolution of symptoms based on the long history of symptoms of greater than 4 years however, we will go ahead and  Recommend carpal tunnel release right wrist

## 2013-06-25 ENCOUNTER — Ambulatory Visit (HOSPITAL_COMMUNITY)
Admission: RE | Admit: 2013-06-25 | Discharge: 2013-06-25 | Disposition: A | Discharge status: Home / Self Care | Payer: Medicaid Other | Source: Ambulatory Visit | Attending: Orthopedic Surgery | Admitting: Orthopedic Surgery

## 2013-06-25 ENCOUNTER — Ambulatory Visit (HOSPITAL_COMMUNITY): Payer: Medicaid Other | Admitting: Anesthesiology

## 2013-06-25 ENCOUNTER — Encounter (HOSPITAL_COMMUNITY): Payer: Medicaid Other | Admitting: Anesthesiology

## 2013-06-25 ENCOUNTER — Encounter (HOSPITAL_COMMUNITY)
Admission: RE | Disposition: A | Discharge status: Home / Self Care | Payer: Self-pay | Source: Ambulatory Visit | Attending: Orthopedic Surgery

## 2013-06-25 ENCOUNTER — Encounter (HOSPITAL_COMMUNITY): Payer: Self-pay | Admitting: *Deleted

## 2013-06-25 DIAGNOSIS — G5601 Carpal tunnel syndrome, right upper limb: Secondary | ICD-10-CM

## 2013-06-25 DIAGNOSIS — F411 Generalized anxiety disorder: Secondary | ICD-10-CM | POA: Insufficient documentation

## 2013-06-25 DIAGNOSIS — Z87891 Personal history of nicotine dependence: Secondary | ICD-10-CM | POA: Insufficient documentation

## 2013-06-25 DIAGNOSIS — Z79899 Other long term (current) drug therapy: Secondary | ICD-10-CM | POA: Insufficient documentation

## 2013-06-25 DIAGNOSIS — I10 Essential (primary) hypertension: Secondary | ICD-10-CM | POA: Insufficient documentation

## 2013-06-25 DIAGNOSIS — G5600 Carpal tunnel syndrome, unspecified upper limb: Secondary | ICD-10-CM | POA: Insufficient documentation

## 2013-06-25 HISTORY — PX: CARPAL TUNNEL RELEASE: SHX101

## 2013-06-25 SURGERY — CARPAL TUNNEL RELEASE
Anesthesia: Monitor Anesthesia Care | Site: Wrist | Laterality: Right

## 2013-06-25 MED ORDER — FENTANYL CITRATE 0.05 MG/ML IJ SOLN
25.0000 ug | INTRAMUSCULAR | Status: AC
  Administered 2013-06-25 (×2): 25 ug via INTRAVENOUS

## 2013-06-25 MED ORDER — CEFAZOLIN SODIUM-DEXTROSE 2-3 GM-% IV SOLR
INTRAVENOUS | Status: AC
  Filled 2013-06-25: qty 50

## 2013-06-25 MED ORDER — PROPOFOL 10 MG/ML IV BOLUS
INTRAVENOUS | Status: AC
  Filled 2013-06-25: qty 20

## 2013-06-25 MED ORDER — HYDROCODONE-ACETAMINOPHEN 7.5-325 MG PO TABS: 1.0000 | ORAL_TABLET | ORAL | Status: DC | PRN

## 2013-06-25 MED ORDER — SODIUM CHLORIDE 0.9 % IR SOLN
Status: DC | PRN
  Administered 2013-06-25: 1000 mL

## 2013-06-25 MED ORDER — LIDOCAINE HCL (PF) 0.5 % IJ SOLN
INTRAMUSCULAR | Status: DC | PRN
Start: 2013-06-25 — End: 2013-06-25
  Administered 2013-06-25: 50 mL via INTRAVENOUS

## 2013-06-25 MED ORDER — FENTANYL CITRATE 0.05 MG/ML IJ SOLN
INTRAMUSCULAR | Status: DC | PRN
  Administered 2013-06-25 (×5): 25 ug via INTRAVENOUS
  Administered 2013-06-25: 50 ug via INTRAVENOUS
  Administered 2013-06-25: 25 ug via INTRAVENOUS

## 2013-06-25 MED ORDER — LIDOCAINE HCL (PF) 0.5 % IJ SOLN
INTRAMUSCULAR | Status: AC
  Filled 2013-06-25: qty 50

## 2013-06-25 MED ORDER — MIDAZOLAM HCL 2 MG/2ML IJ SOLN
INTRAMUSCULAR | Status: AC
  Filled 2013-06-25: qty 2

## 2013-06-25 MED ORDER — SODIUM CHLORIDE 0.9 % IJ SOLN
INTRAMUSCULAR | Status: AC
  Filled 2013-06-25: qty 10

## 2013-06-25 MED ORDER — LACTATED RINGERS IV SOLN
INTRAVENOUS | Status: DC
  Administered 2013-06-25 (×2): via INTRAVENOUS

## 2013-06-25 MED ORDER — PROPOFOL INFUSION 10 MG/ML OPTIME
INTRAVENOUS | Status: DC | PRN
  Administered 2013-06-25: 100 ug/kg/min via INTRAVENOUS

## 2013-06-25 MED ORDER — FENTANYL CITRATE 0.05 MG/ML IJ SOLN
INTRAMUSCULAR | Status: AC
  Filled 2013-06-25: qty 2

## 2013-06-25 MED ORDER — MIDAZOLAM HCL 2 MG/2ML IJ SOLN
1.0000 mg | INTRAMUSCULAR | Status: DC | PRN
  Administered 2013-06-25 (×2): 2 mg via INTRAVENOUS

## 2013-06-25 MED ORDER — CHLORHEXIDINE GLUCONATE 4 % EX LIQD: 60.0000 mL | Freq: Once | CUTANEOUS | Status: DC

## 2013-06-25 MED ORDER — ONDANSETRON HCL 4 MG/2ML IJ SOLN
INTRAMUSCULAR | Status: AC
  Filled 2013-06-25: qty 2

## 2013-06-25 MED ORDER — ONDANSETRON HCL 4 MG/2ML IJ SOLN
4.0000 mg | Freq: Once | INTRAMUSCULAR | Status: AC
  Administered 2013-06-25: 4 mg via INTRAVENOUS

## 2013-06-25 MED ORDER — BUPIVACAINE HCL (PF) 0.5 % IJ SOLN
INTRAMUSCULAR | Status: AC
  Filled 2013-06-25: qty 30

## 2013-06-25 MED ORDER — BUPIVACAINE HCL (PF) 0.5 % IJ SOLN
INTRAMUSCULAR | Status: DC | PRN
  Administered 2013-06-25: 10 mL

## 2013-06-25 MED ORDER — LIDOCAINE HCL (CARDIAC) 10 MG/ML IV SOLN
INTRAVENOUS | Status: DC | PRN
  Administered 2013-06-25: 20 mg via INTRAVENOUS

## 2013-06-25 MED ORDER — CEFAZOLIN SODIUM-DEXTROSE 2-3 GM-% IV SOLR
2.0000 g | INTRAVENOUS | Status: DC
  Administered 2013-06-25: 2 g via INTRAVENOUS

## 2013-06-25 SURGICAL SUPPLY — 40 items
BAG HAMPER (MISCELLANEOUS) ×3 IMPLANT
BANDAGE ELASTIC 3 VELCRO NS (GAUZE/BANDAGES/DRESSINGS) ×3 IMPLANT
BANDAGE ELASTIC 4 VELCRO NS (GAUZE/BANDAGES/DRESSINGS) ×3 IMPLANT
BANDAGE ESMARK 4X12 BL STRL LF (DISPOSABLE) ×1 IMPLANT
BANDAGE GAUZE ELAST BULKY 4 IN (GAUZE/BANDAGES/DRESSINGS) ×3 IMPLANT
BLADE SURG 15 STRL LF DISP TIS (BLADE) ×1 IMPLANT
BLADE SURG 15 STRL SS (BLADE) ×2
BNDG COHESIVE 4X5 TAN STRL (GAUZE/BANDAGES/DRESSINGS) ×3 IMPLANT
BNDG ESMARK 4X12 BLUE STRL LF (DISPOSABLE) ×3
CHLORAPREP W/TINT 26ML (MISCELLANEOUS) ×3 IMPLANT
CLOTH BEACON ORANGE TIMEOUT ST (SAFETY) ×3 IMPLANT
COVER LIGHT HANDLE STERIS (MISCELLANEOUS) ×12 IMPLANT
CUFF TOURNIQUET SINGLE 18IN (TOURNIQUET CUFF) ×3 IMPLANT
DECANTER SPIKE VIAL GLASS SM (MISCELLANEOUS) ×3 IMPLANT
DRSG XEROFORM 1X8 (GAUZE/BANDAGES/DRESSINGS) ×3 IMPLANT
ELECT NEEDLE TIP 2.8 STRL (NEEDLE) ×3 IMPLANT
ELECT REM PT RETURN 9FT ADLT (ELECTROSURGICAL) ×3
ELECTRODE REM PT RTRN 9FT ADLT (ELECTROSURGICAL) ×1 IMPLANT
GLOVE BIOGEL PI IND STRL 7.0 (GLOVE) ×1 IMPLANT
GLOVE BIOGEL PI IND STRL 7.5 (GLOVE) ×1 IMPLANT
GLOVE BIOGEL PI INDICATOR 7.0 (GLOVE) ×2
GLOVE BIOGEL PI INDICATOR 7.5 (GLOVE) ×2
GLOVE SKINSENSE NS SZ8.0 LF (GLOVE) ×2
GLOVE SKINSENSE STRL SZ8.0 LF (GLOVE) ×1 IMPLANT
GLOVE SS BIOGEL STRL SZ 6.5 (GLOVE) ×1 IMPLANT
GLOVE SS N UNI LF 8.5 STRL (GLOVE) ×3 IMPLANT
GLOVE SUPERSENSE BIOGEL SZ 6.5 (GLOVE) ×2
GOWN STRL REUS W/TWL LRG LVL3 (GOWN DISPOSABLE) ×9 IMPLANT
GOWN STRL REUS W/TWL XL LVL3 (GOWN DISPOSABLE) ×3 IMPLANT
HAND ALUMI XLG (SOFTGOODS) ×3 IMPLANT
KIT ROOM TURNOVER APOR (KITS) ×3 IMPLANT
MANIFOLD NEPTUNE II (INSTRUMENTS) ×3 IMPLANT
NEEDLE HYPO 21X1.5 SAFETY (NEEDLE) ×3 IMPLANT
NS IRRIG 1000ML POUR BTL (IV SOLUTION) ×3 IMPLANT
PACK BASIC LIMB (CUSTOM PROCEDURE TRAY) ×3 IMPLANT
PAD ARMBOARD 7.5X6 YLW CONV (MISCELLANEOUS) ×3 IMPLANT
SET BASIN LINEN APH (SET/KITS/TRAYS/PACK) ×3 IMPLANT
SPONGE GAUZE 4X4 12PLY (GAUZE/BANDAGES/DRESSINGS) ×3 IMPLANT
SUT ETHILON 3 0 FSL (SUTURE) ×3 IMPLANT
SYR CONTROL 10ML LL (SYRINGE) ×3 IMPLANT

## 2013-06-25 NOTE — Anesthesia Postprocedure Evaluation (Deleted)
Anesthesia Post Note  Patient: Jamie [name]  Procedure(s) Performed: Procedure(s) (LRB): CARPAL TUNNEL RELEASE (Right)  Anesthesia type: General  Patient location: PACU  Post pain: Pain level controlled  Post assessment: Post-op Vital signs reviewed, Patient's Cardiovascular Status Stable, Respiratory Function Stable, Patent Airway, No signs of Nausea or vomiting and Pain level moderate  Last Vitals:  Filed Vitals:   06/25/13 0745  BP: 143/99  Pulse: 53  Temp: 36.5 C  Resp: 20    Post vital signs: Reviewed and stable  Level of consciousness: awake and alert   Complications: No apparent anesthesia complications

## 2013-06-25 NOTE — Interval H&P Note (Signed)
History and Physical Interval Note:  06/25/2013 8:46 AM  Jamie [name]  has presented today for surgery, with the diagnosis of right capal tunnel syndrome  The various methods of treatment have been discussed with the patient and family. After consideration of risks, benefits and other options for treatment, the patient has consented to  Procedure(s): CARPAL TUNNEL RELEASE (Right) as a surgical intervention .  The patient's history has been reviewed, patient examined, no change in status, stable for surgery.  I have reviewed the patient's chart and labs.  Questions were answered to the patient's satisfaction.     Arther Abbott

## 2013-06-25 NOTE — Op Note (Signed)
06/25/2013  9:43 AM  PATIENT:  Jamie [name]  43 y.o. male  PRE-OPERATIVE DIAGNOSIS:  right capal tunnel syndrome  POST-OPERATIVE DIAGNOSIS:  right capal tunnel syndrome  PROCEDURE:  Procedure(s): CARPAL TUNNEL RELEASE (Right)  Operative findings: Extensive synovitis in the carpal,. Discoloration and yellowing of the median nerve.  SURGEON:  Surgeon(s) and Role:    * Carole Civil, MD - Primary  PHYSICIAN ASSISTANT:   ASSISTANTS: none   ANESTHESIA:   regional  EBL:  Total I/O In: 1000 [I.V.:1000] Out: 0   BLOOD ADMINISTERED:none  DRAINS: none   LOCAL MEDICATIONS USED:  MARCAINE     SPECIMEN:  No Specimen  DISPOSITION OF SPECIMEN:  N/A  COUNTS:  YES  TOURNIQUET:   Total Tourniquet Time Documented: Upper Arm (Right) - 29 minutes Total: Upper Arm (Right) - 29 minutes   DICTATION: .Viviann Spare Dictation  PLAN OF CARE: Discharge to home after PACU  PATIENT DISPOSITION:  PACU - hemodynamically stable.   Delay start of Pharmacological VTE agent (>24hrs) due to surgical blood loss or risk of bleeding: not applicable  Details of procedure: The patient was identified in the preop holding area and the surgical site was marked encounter signed.  The history and physical update was completed.  Antibiotics were ordered and the patient was given antibiotics and taken to surgery.  In the surgical suite a Bier block was done and then the arm was prepped and draped in sterile technique.  At this point the timeout procedure was initiated and completed.  The incision was made over the carpal tunnel and extended to the palmar fascia.  The palmar fascia was divided bluntly and dissection was carried out to the distal aspect of the transverse carpal ligament.  Blunt dissection was carried out beneath the ligament and sharp dissection was used to release the ligament.  The contents of the carpal tunnel were inspected and found to be free of any other pathology.  Irrigation was  performed.  Closure was with interrupted 3-0 nylon suture.  10 cc of plain Sensorcaine was injected around the incision edges.  A sterile bandage was applied.  The tourniquet was released the color of the fingers was normal and the capillary refill was normal.  The patient is transferred to Recovery area and will be discharged home.  [place]

## 2013-06-25 NOTE — Addendum Note (Signed)
Addendum created 06/25/13 1340 by Ollen Bowl, CRNA   Modules edited: Anesthesia Events, Anesthesia Medication Administration

## 2013-06-25 NOTE — Anesthesia Preprocedure Evaluation (Signed)
Anesthesia Evaluation  Patient identified by MRN, date of birth, ID band Patient awake    Reviewed: Allergy & Precautions, H&P , NPO status , Patient's Chart, lab work & pertinent test results  Airway Mallampati: I TM Distance: >3 FB Neck ROM: Full    Dental  (+) Teeth Intact   Pulmonary former smoker,  breath sounds clear to auscultation        Cardiovascular hypertension (no meds now), Rhythm:Regular Rate:Normal     Neuro/Psych PSYCHIATRIC DISORDERS Anxiety    GI/Hepatic negative GI ROS,   Endo/Other    Renal/GU      Musculoskeletal   Abdominal   Peds  Hematology   Anesthesia Other Findings   Reproductive/Obstetrics                           Anesthesia Physical Anesthesia Plan  ASA: II  Anesthesia Plan: MAC and Bier Block   Post-op Pain Management:    Induction: Intravenous  Airway Management Planned: Nasal Cannula  Additional Equipment:   Intra-op Plan:   Post-operative Plan:   Informed Consent: I have reviewed the patients History and Physical, chart, labs and discussed the procedure including the risks, benefits and alternatives for the proposed anesthesia with the patient or authorized representative who has indicated his/her understanding and acceptance.     Plan Discussed with:   Anesthesia Plan Comments:         Anesthesia Quick Evaluation

## 2013-06-25 NOTE — Anesthesia Procedure Notes (Addendum)
Procedure Name: MAC Date/Time: 06/25/2013 9:01 AM Performed by: Vista Deck Pre-anesthesia Checklist: Patient identified, Emergency Drugs available, Suction available, Timeout performed and Patient being monitored Patient Re-evaluated:Patient Re-evaluated prior to inductionOxygen Delivery Method: Nasal Cannula    Procedure Name: MAC Date/Time: 06/25/2013 9:13 AM Performed by: Vista Deck Pre-anesthesia Checklist: Patient identified, Emergency Drugs available, Suction available, Timeout performed and Patient being monitored Patient Re-evaluated:Patient Re-evaluated prior to inductionOxygen Delivery Method: Non-rebreather mask    Anesthesia Regional Block:  Bier block (IV Regional)  Pre-Anesthetic Checklist: ,, timeout performed, Correct Patient, Correct Site, Correct Laterality, Correct Procedure,, site marked, surgical consent,, at surgeon's request  Laterality: Right     Needles:  Injection technique: Single-shot  Needle Type: Other      Needle Gauge: 22 and 22 G    Additional Needles: Bier block (IV Regional)  Nerve Stimulator or Paresthesia:   Additional Responses:  Pulse checked post tourniquet inflation. IV NSL discontinued post injection. Narrative:  Start time: 06/25/2013 9:09 AM End time: 06/25/2013 9:13 AM  Performed by: Personally

## 2013-06-25 NOTE — Brief Op Note (Signed)
06/25/2013  9:43 AM  PATIENT:  Jamie [name]  42 y.o. male  PRE-OPERATIVE DIAGNOSIS:  right capal tunnel syndrome  POST-OPERATIVE DIAGNOSIS:  right capal tunnel syndrome  PROCEDURE:  Procedure(s): CARPAL TUNNEL RELEASE (Right)  Operative findings: Extensive synovitis in the carpal,. Discoloration and yellowing of the median nerve.  SURGEON:  Surgeon(s) and Role:    * Stanley E Harrison, MD - Primary  PHYSICIAN ASSISTANT:   ASSISTANTS: none   ANESTHESIA:   regional  EBL:  Total I/O In: 1000 [I.V.:1000] Out: 0   BLOOD ADMINISTERED:none  DRAINS: none   LOCAL MEDICATIONS USED:  MARCAINE     SPECIMEN:  No Specimen  DISPOSITION OF SPECIMEN:  N/A  COUNTS:  YES  TOURNIQUET:   Total Tourniquet Time Documented: Upper Arm (Right) - 29 minutes Total: Upper Arm (Right) - 29 minutes   DICTATION: .Dragon Dictation  PLAN OF CARE: Discharge to home after PACU  PATIENT DISPOSITION:  PACU - hemodynamically stable.   Delay start of Pharmacological VTE agent (>24hrs) due to surgical blood loss or risk of bleeding: not applicable  Details of procedure: The patient was identified in the preop holding area and the surgical site was marked encounter signed.  The history and physical update was completed.  Antibiotics were ordered and the patient was given antibiotics and taken to surgery.  In the surgical suite a Bier block was done and then the arm was prepped and draped in sterile technique.  At this point the timeout procedure was initiated and completed.  The incision was made over the carpal tunnel and extended to the palmar fascia.  The palmar fascia was divided bluntly and dissection was carried out to the distal aspect of the transverse carpal ligament.  Blunt dissection was carried out beneath the ligament and sharp dissection was used to release the ligament.  The contents of the carpal tunnel were inspected and found to be free of any other pathology.  Irrigation was  performed.  Closure was with interrupted 3-0 nylon suture.  10 cc of plain Sensorcaine was injected around the incision edges.  A sterile bandage was applied.  The tourniquet was released the color of the fingers was normal and the capillary refill was normal.  The patient is transferred to Recovery area and will be discharged home.  [id number]  

## 2013-06-25 NOTE — Transfer of Care (Signed)
Immediate Anesthesia Transfer of Care Note  Patient: Jamie [name]  Procedure(s) Performed: Procedure(s) (LRB): CARPAL TUNNEL RELEASE (Right)  Patient Location: PACU  Anesthesia Type: MAC  Level of Consciousness: awake  Airway & Oxygen Therapy: Patient Spontanous Breathing.   Post-op Assessment: Report given to PACU RN, Post -op Vital signs reviewed and stable and Patient moving all extremities  Post vital signs: Reviewed and stable  Complications: No apparent anesthesia complications

## 2013-06-25 NOTE — Anesthesia Postprocedure Evaluation (Signed)
Anesthesia Post Note  Patient: Jamie [name]  Procedure(s) Performed: Procedure(s) (LRB): CARPAL TUNNEL RELEASE (Right)  Anesthesia type: MAC  Patient location: PACU  Post pain: Pain level controlled  Post assessment: Post-op Vital signs reviewed, Patient's Cardiovascular Status Stable, Respiratory Function Stable, Patent Airway, No signs of Nausea or vomiting and Pain level controlled  Last Vitals:  Filed Vitals:   06/25/13 0947  BP: 160/81  Pulse: 65  Temp: 36.7 C  Resp: 16    Post vital signs: Reviewed and stable  Level of consciousness: awake and alert   Complications: No apparent anesthesia complications

## 2013-06-26 ENCOUNTER — Encounter (HOSPITAL_COMMUNITY): Payer: Self-pay | Admitting: Orthopedic Surgery

## 2013-06-30 ENCOUNTER — Ambulatory Visit (INDEPENDENT_AMBULATORY_CARE_PROVIDER_SITE_OTHER): Payer: Medicaid Other | Admitting: Orthopedic Surgery

## 2013-06-30 ENCOUNTER — Other Ambulatory Visit: Payer: Self-pay | Admitting: *Deleted

## 2013-06-30 VITALS — BP 120/75 | Ht 71.0 in | Wt 225.0 lb

## 2013-06-30 DIAGNOSIS — G5600 Carpal tunnel syndrome, unspecified upper limb: Secondary | ICD-10-CM

## 2013-06-30 MED ORDER — HYDROCODONE-ACETAMINOPHEN 7.5-325 MG PO TABS: 1.0000 | ORAL_TABLET | ORAL | Status: DC | PRN

## 2013-06-30 NOTE — Progress Notes (Signed)
Patient ID: Jamie [name], male   DOB: August 03, 1970, 43 y.o.   MRN: [id number]  Chief Complaint  Patient presents with  . Follow-up    Post op # 1 Right CTR DOS 06/25/13   5 days post op   Incision looks good   Return on 30th for suture removal

## 2013-06-30 NOTE — Patient Instructions (Addendum)
Keep clean and dry   Move fingers as tolerated   Do finger exercises 100 times a day

## 2013-07-08 ENCOUNTER — Ambulatory Visit (INDEPENDENT_AMBULATORY_CARE_PROVIDER_SITE_OTHER): Payer: Medicaid Other | Admitting: Orthopedic Surgery

## 2013-07-08 VITALS — BP 112/69 | Ht 71.0 in | Wt 225.0 lb

## 2013-07-08 DIAGNOSIS — G5600 Carpal tunnel syndrome, unspecified upper limb: Secondary | ICD-10-CM

## 2013-07-08 MED ORDER — HYDROCODONE-ACETAMINOPHEN 5-325 MG PO TABS: 1.0000 | ORAL_TABLET | Freq: Four times a day (QID) | ORAL | Status: DC | PRN

## 2013-07-08 NOTE — Progress Notes (Signed)
Patient ID: Jamie [name], male   DOB: Oct 24, 1970, 43 y.o.   MRN: [id number]  Chief Complaint  Patient presents with  . Follow-up    Post op #2 right CTR DOS     Postoperative visit  Procedure: (RIGHT   CTR)  Date of procedure 06/27/2013  Diagnosis Carpal Tunnel Syndrome   Operative findings Median Nerve compression  Appearance of incision CLEAN   ROM IS GOOD   Patient complaints NONE  Plan DRESSING AROM   SCHEDULE FOR LEFT CTR 4/17

## 2013-07-08 NOTE — Patient Instructions (Signed)
LEFT CTR 4-17

## 2013-07-09 ENCOUNTER — Other Ambulatory Visit: Payer: Self-pay | Admitting: *Deleted

## 2013-07-14 ENCOUNTER — Telehealth: Payer: Self-pay | Admitting: Orthopedic Surgery

## 2013-07-14 NOTE — Telephone Encounter (Signed)
Regarding out-patient surgery scheduled 07/25/13 at [facility], [place] (Left), ICD9 code 354.0, contacted website per Medicaid's third-party authorization, [place] [employer],  no pre-authorization required.

## 2013-07-17 ENCOUNTER — Encounter (HOSPITAL_COMMUNITY): Payer: Self-pay | Admitting: Pharmacy Technician

## 2013-07-18 MED ORDER — ONDANSETRON HCL 4 MG/2ML IJ SOLN: 4.0000 mg | Freq: Once | INTRAMUSCULAR | Status: AC | PRN

## 2013-07-18 MED ORDER — FENTANYL CITRATE 0.05 MG/ML IJ SOLN: 25.0000 ug | INTRAMUSCULAR | Status: DC | PRN

## 2013-07-21 ENCOUNTER — Encounter (HOSPITAL_COMMUNITY): Payer: Self-pay

## 2013-07-21 ENCOUNTER — Encounter (HOSPITAL_COMMUNITY)
Admission: RE | Admit: 2013-07-21 | Discharge: 2013-07-21 | Disposition: A | Discharge status: Home / Self Care | Payer: Medicaid Other | Source: Ambulatory Visit | Attending: Orthopedic Surgery | Admitting: Orthopedic Surgery

## 2013-07-21 HISTORY — DX: Anxiety disorder, unspecified: F41.9

## 2013-07-21 NOTE — Patient Instructions (Signed)
Jamie [name]  07/21/2013   Your procedure is scheduled on:   07/25/2013  Report to [facility] at  3  AM.  Call this number if you have problems the morning of surgery: [phone]   Remember:   Do not eat food or drink liquids after midnight.   Take these medicines the morning of surgery with A SIP OF WATER:  Clonazepam, flexaril, prozac, hydrocodone   Do not wear jewelry, make-up or nail polish.  Do not wear lotions, powders, or perfumes.   Do not shave 48 hours prior to surgery. Men may shave face and neck.  Do not bring valuables to the hospital.  [facility] is not responsible for any belongings or valuables.               Contacts, dentures or bridgework may not be worn into surgery.  Leave suitcase in the car. After surgery it may be brought to your room.  For patients admitted to the hospital, discharge time is determined by your treatment team.               Patients discharged the day of surgery will not be allowed to drive home.  Name and phone number of your driver: family  Special Instructions: Shower using CHG 2 nights before surgery and the night before surgery.  If you shower the day of surgery use CHG.  Use special wash - you have one bottle of CHG for all showers.  You should use approximately 1/3 of the bottle for each shower.   Please read over the following fact sheets that you were given: Pain Booklet, Coughing and Deep Breathing, Surgical Site Infection Prevention, Anesthesia Post-op Instructions and Care and Recovery After Surgery Carpal Tunnel Release Carpal tunnel release is done to relieve the pressure on the nerves and tendons on the bottom side of your wrist.  LET YOUR CAREGIVER KNOW ABOUT:   Allergies to food or medicine.  Medicines taken, including vitamins, herbs, eyedrops, over-the-counter medicines, and creams.  Use of steroids (by mouth or creams).  Previous problems with anesthetics or numbing medicines.  History of bleeding problems  or blood clots.  Previous surgery.  Other health problems, including diabetes and kidney problems.  Possibility of pregnancy, if this applies. RISKS AND COMPLICATIONS  Some problems that may happen after this procedure include:  Infection.  Damage to the nerves, arteries or tendons could occur. This would be very uncommon.  Bleeding. BEFORE THE PROCEDURE   This surgery may be done while you are asleep (general anesthetic) or may be done under a block where only your forearm and the surgical area is numb.  If the surgery is done under a block, the numbness will gradually wear off within several hours after surgery. HOME CARE INSTRUCTIONS   Have a responsible person with you for 24 hours.  Do not drive a car or use public transportation for 24 hours.  Only take over-the-counter or prescription medicines for pain, discomfort, or fever as directed by your caregiver. Take them as directed.  You may put ice on the palm side of the affected wrist.  Put ice in a plastic bag.  Place a towel between your skin and the bag.  Leave the ice on for 20 to 30 minutes, 4 times per day.  If you were given a splint to keep your wrist from bending, use it as directed. It is important to wear the splint at night or as directed. Use  the splint for as long as you have pain or numbness in your hand, arm, or wrist. This may take 1 to 2 months.  Keep your hand raised (elevated) above the level of your heart as much as possible. This keeps swelling down and helps with discomfort.  Change bandages (dressings) as directed.  Keep the wound clean and dry. SEEK MEDICAL CARE IF:   You develop pain not relieved with medications.  You develop numbness of your hand.  You develop bleeding from your surgical site.  You have an oral temperature above 102 F (38.9 C).  You develop redness or swelling of the surgical site.  You develop new, unexplained problems. SEEK IMMEDIATE MEDICAL CARE IF:   You  develop a rash.  You have difficulty breathing.  You develop any reaction or side effects to medications given. Document Released: 06/17/2003 Document Revised: 06/19/2011 Document Reviewed: 01/31/2007 [facility] Patient Information 2014 [place]. PATIENT INSTRUCTIONS POST-ANESTHESIA  IMMEDIATELY FOLLOWING SURGERY:  Do not drive or operate machinery for the first twenty four hours after surgery.  Do not make any important decisions for twenty four hours after surgery or while taking narcotic pain medications or sedatives.  If you develop intractable nausea and vomiting or a severe headache please notify your doctor immediately.  FOLLOW-UP:  Please make an appointment with your surgeon as instructed. You do not need to follow up with anesthesia unless specifically instructed to do so.  WOUND CARE INSTRUCTIONS (if applicable):  Keep a dry clean dressing on the anesthesia/puncture wound site if there is drainage.  Once the wound has quit draining you may leave it open to air.  Generally you should leave the bandage intact for twenty four hours unless there is drainage.  If the epidural site drains for more than 36-48 hours please call the anesthesia department.  QUESTIONS?:  Please feel free to call your physician or the hospital operator if you have any questions, and they will be happy to assist you.

## 2013-07-21 NOTE — Pre-Procedure Instructions (Signed)
Patient given information to sign up for my chart at home. 

## 2013-07-24 NOTE — H&P (Signed)
  Chief Complaint   Patient presents with   .  Hand Pain       Left hand pain and paresthesias     43 year old male presents for carpal tunnel release of the left upper extremity after successful right carpal tunnel release History of depression benign essential hypertension bipolar disorder cervicalgia anxiety. He has already had nerve studies showing moderate carpal tunnel syndrome and he is oriented and treated with splints. His pain and symptoms of numbness tingling weakness and dropping small objects when he tries to pick them up have been present for at least 4 years she's 1 splints on and off for 2 years and consistently over the last 2 months  Previous medical history includes right knee surgery left elbow surgery family history of heart disease cancer asthma and diabetes currently disabled stopped smoking 2 months ago doesn't drink current medications are fluoxetine 20 mg lorazepam 1 mg ibuprofen 600 mg oxycodone 5 mg with the last 2 medications taken after he fell off a ladder a few weeks ago x-rays were negative  Review of systems chest pain snoring depression otherwise negative  Bilateral hand examination BP 146/82  Ht 5\' 11"  (1.803 m)  Wt 225 lb (102.059 kg)  BMI 31.39 kg/m2 General appearance is normal, the patient is alert and oriented x3 with normal mood and affect.  No swelling in the hand he has full range of motion he has small nodes in the small digits of the fingers his grip strength is normal skin is intact there are some open areas from scratching. Wrist joints are stable. He has decreased sensation on left hand with decreased sensation to sharp touch and soft touch pulses in the radial and ulnar arteries are normal lymph nodes are negative  Nerve conduction study is positive bilaterally  Left carpal tunnel syndrome  Left carpal tunnel release

## 2013-07-25 ENCOUNTER — Encounter (HOSPITAL_COMMUNITY): Payer: Medicaid Other | Admitting: Anesthesiology

## 2013-07-25 ENCOUNTER — Ambulatory Visit (HOSPITAL_COMMUNITY)
Admission: RE | Admit: 2013-07-25 | Discharge: 2013-07-25 | Disposition: A | Discharge status: Home / Self Care | Payer: Medicaid Other | Source: Ambulatory Visit | Attending: Orthopedic Surgery | Admitting: Orthopedic Surgery

## 2013-07-25 ENCOUNTER — Ambulatory Visit (HOSPITAL_COMMUNITY): Payer: Medicaid Other | Admitting: Anesthesiology

## 2013-07-25 ENCOUNTER — Encounter (HOSPITAL_COMMUNITY)
Admission: RE | Disposition: A | Discharge status: Home / Self Care | Payer: Self-pay | Source: Ambulatory Visit | Attending: Orthopedic Surgery

## 2013-07-25 ENCOUNTER — Encounter (HOSPITAL_COMMUNITY): Payer: Self-pay | Admitting: *Deleted

## 2013-07-25 DIAGNOSIS — G5602 Carpal tunnel syndrome, left upper limb: Secondary | ICD-10-CM

## 2013-07-25 DIAGNOSIS — Z6831 Body mass index (BMI) 31.0-31.9, adult: Secondary | ICD-10-CM | POA: Insufficient documentation

## 2013-07-25 DIAGNOSIS — Z79899 Other long term (current) drug therapy: Secondary | ICD-10-CM | POA: Insufficient documentation

## 2013-07-25 DIAGNOSIS — Z87891 Personal history of nicotine dependence: Secondary | ICD-10-CM | POA: Insufficient documentation

## 2013-07-25 DIAGNOSIS — G5600 Carpal tunnel syndrome, unspecified upper limb: Secondary | ICD-10-CM

## 2013-07-25 DIAGNOSIS — F411 Generalized anxiety disorder: Secondary | ICD-10-CM | POA: Insufficient documentation

## 2013-07-25 DIAGNOSIS — I10 Essential (primary) hypertension: Secondary | ICD-10-CM | POA: Insufficient documentation

## 2013-07-25 HISTORY — PX: CARPAL TUNNEL RELEASE: SHX101

## 2013-07-25 SURGERY — CARPAL TUNNEL RELEASE
Anesthesia: Monitor Anesthesia Care | Site: Hand | Laterality: Left

## 2013-07-25 MED ORDER — BUPIVACAINE HCL (PF) 0.5 % IJ SOLN
INTRAMUSCULAR | Status: DC | PRN
  Administered 2013-07-25: 10 mL

## 2013-07-25 MED ORDER — ONDANSETRON HCL 4 MG/2ML IJ SOLN
4.0000 mg | Freq: Once | INTRAMUSCULAR | Status: AC | PRN
  Administered 2013-07-25: 4 mg via INTRAVENOUS

## 2013-07-25 MED ORDER — MIDAZOLAM HCL 5 MG/5ML IJ SOLN
INTRAMUSCULAR | Status: DC | PRN
  Administered 2013-07-25: 2 mg via INTRAVENOUS

## 2013-07-25 MED ORDER — ONDANSETRON HCL 4 MG/2ML IJ SOLN
INTRAMUSCULAR | Status: AC
  Filled 2013-07-25: qty 2

## 2013-07-25 MED ORDER — MIDAZOLAM HCL 2 MG/2ML IJ SOLN
INTRAMUSCULAR | Status: AC
  Filled 2013-07-25: qty 2

## 2013-07-25 MED ORDER — LACTATED RINGERS IV SOLN
INTRAVENOUS | Status: DC
  Administered 2013-07-25: 07:00:00 via INTRAVENOUS

## 2013-07-25 MED ORDER — FENTANYL CITRATE 0.05 MG/ML IJ SOLN
INTRAMUSCULAR | Status: AC
  Filled 2013-07-25: qty 2

## 2013-07-25 MED ORDER — FENTANYL CITRATE 0.05 MG/ML IJ SOLN
INTRAMUSCULAR | Status: DC | PRN
  Administered 2013-07-25: 25 ug via INTRAVENOUS
  Administered 2013-07-25: 50 ug via INTRAVENOUS

## 2013-07-25 MED ORDER — MIDAZOLAM HCL 2 MG/2ML IJ SOLN
1.0000 mg | INTRAMUSCULAR | Status: DC | PRN
Start: 2013-07-25 — End: 2013-07-25
  Administered 2013-07-25: 2 mg via INTRAVENOUS

## 2013-07-25 MED ORDER — CHLORHEXIDINE GLUCONATE 4 % EX LIQD: 60.0000 mL | Freq: Once | CUTANEOUS | Status: DC

## 2013-07-25 MED ORDER — CEFAZOLIN SODIUM-DEXTROSE 2-3 GM-% IV SOLR
2.0000 g | INTRAVENOUS | Status: AC
  Administered 2013-07-25: 2 g via INTRAVENOUS

## 2013-07-25 MED ORDER — FENTANYL CITRATE 0.05 MG/ML IJ SOLN
INTRAMUSCULAR | Status: AC
  Filled 2013-07-25: qty 5

## 2013-07-25 MED ORDER — SODIUM CHLORIDE 0.9 % IR SOLN
Status: DC | PRN
  Administered 2013-07-25: 500 mL

## 2013-07-25 MED ORDER — FENTANYL CITRATE 0.05 MG/ML IJ SOLN
25.0000 ug | INTRAMUSCULAR | Status: AC
  Administered 2013-07-25 (×2): 25 ug via INTRAVENOUS

## 2013-07-25 MED ORDER — BUPIVACAINE HCL (PF) 0.5 % IJ SOLN
INTRAMUSCULAR | Status: AC
  Filled 2013-07-25: qty 30

## 2013-07-25 MED ORDER — CEFAZOLIN SODIUM-DEXTROSE 2-3 GM-% IV SOLR
INTRAVENOUS | Status: AC
  Filled 2013-07-25: qty 50

## 2013-07-25 MED ORDER — HYDROCODONE-ACETAMINOPHEN 10-325 MG PO TABS: 1.0000 | ORAL_TABLET | Freq: Four times a day (QID) | ORAL | Status: DC | PRN

## 2013-07-25 MED ORDER — FENTANYL CITRATE 0.05 MG/ML IJ SOLN
25.0000 ug | INTRAMUSCULAR | Status: DC | PRN
  Administered 2013-07-25 (×2): 50 ug via INTRAVENOUS

## 2013-07-25 MED ORDER — PROPOFOL 10 MG/ML IV BOLUS
INTRAVENOUS | Status: AC
  Filled 2013-07-25: qty 20

## 2013-07-25 MED ORDER — LIDOCAINE HCL (PF) 0.5 % IJ SOLN
INTRAMUSCULAR | Status: DC | PRN
  Administered 2013-07-25: 50 mL via INTRAVENOUS

## 2013-07-25 MED ORDER — PROPOFOL INFUSION 10 MG/ML OPTIME
INTRAVENOUS | Status: DC | PRN
  Administered 2013-07-25: 50 ug/kg/min via INTRAVENOUS

## 2013-07-25 MED ORDER — SODIUM CHLORIDE 0.9 % IJ SOLN
INTRAMUSCULAR | Status: AC
  Filled 2013-07-25: qty 10

## 2013-07-25 SURGICAL SUPPLY — 35 items
BAG HAMPER (MISCELLANEOUS) ×3 IMPLANT
BANDAGE ELASTIC 3 VELCRO NS (GAUZE/BANDAGES/DRESSINGS) ×3 IMPLANT
BANDAGE ESMARK 4X12 BL STRL LF (DISPOSABLE) ×1 IMPLANT
BANDAGE GAUZE ELAST BULKY 4 IN (GAUZE/BANDAGES/DRESSINGS) ×3 IMPLANT
BLADE SURG 15 STRL LF DISP TIS (BLADE) ×1 IMPLANT
BLADE SURG 15 STRL SS (BLADE) ×2
BNDG COHESIVE 4X5 TAN STRL (GAUZE/BANDAGES/DRESSINGS) IMPLANT
BNDG ESMARK 4X12 BLUE STRL LF (DISPOSABLE) ×3
CHLORAPREP W/TINT 26ML (MISCELLANEOUS) ×3 IMPLANT
CLOTH BEACON ORANGE TIMEOUT ST (SAFETY) ×3 IMPLANT
COVER LIGHT HANDLE STERIS (MISCELLANEOUS) ×6 IMPLANT
CUFF TOURNIQUET SINGLE 18IN (TOURNIQUET CUFF) ×3 IMPLANT
DECANTER SPIKE VIAL GLASS SM (MISCELLANEOUS) ×3 IMPLANT
DRSG XEROFORM 1X8 (GAUZE/BANDAGES/DRESSINGS) ×3 IMPLANT
ELECT NEEDLE TIP 2.8 STRL (NEEDLE) ×3 IMPLANT
ELECT REM PT RETURN 9FT ADLT (ELECTROSURGICAL) ×3
ELECTRODE REM PT RTRN 9FT ADLT (ELECTROSURGICAL) ×1 IMPLANT
GLOVE ECLIPSE 6.5 STRL STRAW (GLOVE) ×3 IMPLANT
GLOVE INDICATOR 7.0 STRL GRN (GLOVE) ×6 IMPLANT
GLOVE SKINSENSE NS SZ8.0 LF (GLOVE) ×2
GLOVE SKINSENSE STRL SZ8.0 LF (GLOVE) ×1 IMPLANT
GLOVE SS N UNI LF 8.5 STRL (GLOVE) ×3 IMPLANT
GOWN STRL REUS W/TWL LRG LVL3 (GOWN DISPOSABLE) ×6 IMPLANT
GOWN STRL REUS W/TWL XL LVL3 (GOWN DISPOSABLE) ×3 IMPLANT
HAND ALUMI XLG (SOFTGOODS) ×3 IMPLANT
KIT ROOM TURNOVER APOR (KITS) ×3 IMPLANT
MANIFOLD NEPTUNE II (INSTRUMENTS) ×3 IMPLANT
NEEDLE HYPO 21X1.5 SAFETY (NEEDLE) ×3 IMPLANT
NS IRRIG 1000ML POUR BTL (IV SOLUTION) ×3 IMPLANT
PACK BASIC LIMB (CUSTOM PROCEDURE TRAY) ×3 IMPLANT
PAD ARMBOARD 7.5X6 YLW CONV (MISCELLANEOUS) ×3 IMPLANT
SET BASIN LINEN APH (SET/KITS/TRAYS/PACK) ×3 IMPLANT
SPONGE GAUZE 4X4 12PLY (GAUZE/BANDAGES/DRESSINGS) ×3 IMPLANT
SUT ETHILON 3 0 FSL (SUTURE) ×6 IMPLANT
SYR CONTROL 10ML LL (SYRINGE) ×3 IMPLANT

## 2013-07-25 NOTE — Transfer of Care (Signed)
Immediate Anesthesia Transfer of Care Note  Patient: Jamie [name]  Procedure(s) Performed: Procedure(s): LEFT CARPAL TUNNEL RELEASE (Left)  Patient Location: PACU  Anesthesia Type:MAC and Bier block  Level of Consciousness: awake, alert  and oriented  Airway & Oxygen Therapy: Patient Spontanous Breathing  Post-op Assessment: Report given to PACU RN  Post vital signs: Reviewed and stable  Complications: No apparent anesthesia complications

## 2013-07-25 NOTE — Anesthesia Postprocedure Evaluation (Signed)
  Anesthesia Post-op Note  Patient: Jamie [name]  Procedure(s) Performed: Procedure(s): LEFT CARPAL TUNNEL RELEASE (Left)  Patient Location: PACU  Anesthesia Type:MAC and Bier block  Level of Consciousness: awake, alert  and oriented  Airway and Oxygen Therapy: Patient Spontanous Breathing  Post-op Pain: none  Post-op Assessment: Post-op Vital signs reviewed, Patient's Cardiovascular Status Stable, Respiratory Function Stable, Patent Airway and No signs of Nausea or vomiting  Post-op Vital Signs: Reviewed and stable  Last Vitals:  Filed Vitals:   07/25/13 0725  BP: 105/67  Pulse:   Temp:   Resp: 14    Complications: No apparent anesthesia complications

## 2013-07-25 NOTE — Anesthesia Preprocedure Evaluation (Signed)
Anesthesia Evaluation  Patient identified by MRN, date of birth, ID band Patient awake    Reviewed: Allergy & Precautions, H&P , NPO status , Patient's Chart, lab work & pertinent test results  Airway Mallampati: I TM Distance: >3 FB Neck ROM: Full    Dental  (+) Teeth Intact   Pulmonary former smoker,  breath sounds clear to auscultation        Cardiovascular hypertension, Rhythm:Regular Rate:Normal     Neuro/Psych PSYCHIATRIC DISORDERS Anxiety    GI/Hepatic negative GI ROS,   Endo/Other    Renal/GU      Musculoskeletal   Abdominal   Peds  Hematology   Anesthesia Other Findings   Reproductive/Obstetrics                           Anesthesia Physical Anesthesia Plan  ASA: II  Anesthesia Plan: MAC and Bier Block   Post-op Pain Management:    Induction: Intravenous  Airway Management Planned: Nasal Cannula  Additional Equipment:   Intra-op Plan:   Post-operative Plan:   Informed Consent: I have reviewed the patients History and Physical, chart, labs and discussed the procedure including the risks, benefits and alternatives for the proposed anesthesia with the patient or authorized representative who has indicated his/her understanding and acceptance.     Plan Discussed with:   Anesthesia Plan Comments:         Anesthesia Quick Evaluation

## 2013-07-25 NOTE — Interval H&P Note (Signed)
History and Physical Interval Note:  07/25/2013 7:26 AM  Jamie [name]  has presented today for surgery, with the diagnosis of Left carpal tunnel Syndrome  The various methods of treatment have been discussed with the patient and family. After consideration of risks, benefits and other options for treatment, the patient has consented to  Procedure(s): LEFT CARPAL TUNNEL RELEASE (Left) as a surgical intervention .  The patient's history has been reviewed, patient examined, no change in status, stable for surgery.  I have reviewed the patient's chart and labs.  Questions were answered to the patient's satisfaction.     Carole Civil

## 2013-07-25 NOTE — Op Note (Signed)
07/25/2013  8:10 AM  PATIENT:  Jamie [name]  43 y.o. male  PRE-OPERATIVE DIAGNOSIS:  Left carpal tunnel Syndrome  POST-OPERATIVE DIAGNOSIS:  Left carpal tunnel Syndrome  Operative findings  Stenotic left carpal tunnel was median nerve compression. Nerve color gray. Apple core lesion present.  PROCEDURE:  Procedure(s): LEFT CARPAL TUNNEL RELEASE (Left)  SURGEON:  Surgeon(s) and Role:    * Carole Civil, MD - Primary  PHYSICIAN ASSISTANT:   ASSISTANTS: none   ANESTHESIA:   regional  EBL:  Total I/O In: 300 [I.V.:300] Out: -   BLOOD ADMINISTERED:none  DRAINS: none   LOCAL MEDICATIONS USED:  MARCAINE    and Amount: 10 ml  SPECIMEN:  No Specimen  DISPOSITION OF SPECIMEN:  N/A  COUNTS:  YES  TOURNIQUET:  * Missing tourniquet times found for documented tourniquets in log:  [id number] *  DICTATION: .Dragon Dictation  PLAN OF CARE: Discharge to home after PACU  PATIENT DISPOSITION:  PACU - hemodynamically stable.   Delay start of Pharmacological VTE agent (>24hrs) due to surgical blood loss or risk of bleeding: not applicable   Details of procedure: The patient was identified in the preop holding area and the surgical site LEFT WRISTS AND HAND  was marked. The history and physical update was completed.  Antibiotics were ordered and the patient was given [place].  In the surgical suite a Bier block was done and then the arm was prepped and draped in sterile technique.  At this point the timeout procedure was initiated and completed.  The incision was made over the LEFT carpal tunnel and extended to the palmar fascia.  The palmar fascia was divided bluntly and dissection was carried out to the distal aspect of the transverse carpal ligament.  Blunt dissection was carried out beneath the ligament and sharp dissection was used to release the ligament.  The contents of the carpal tunnel were inspected and found to be free of any other pathology.  Irrigation was  performed.  Closure was with interrupted 3-0 nylon suture.  20 cc of plain Sensorcaine was injected around the incision edges.  A sterile bandage was applied.  The tourniquet was released the color of the fingers was normal and the capillary refill was normal.

## 2013-07-25 NOTE — Brief Op Note (Signed)
07/25/2013  8:10 AM  PATIENT:  Jamie [name]  43 y.o. male  PRE-OPERATIVE DIAGNOSIS:  Left carpal tunnel Syndrome  POST-OPERATIVE DIAGNOSIS:  Left carpal tunnel Syndrome  Operative findings  Stenotic left carpal tunnel was median nerve compression. Nerve color gray. Apple core lesion present.  PROCEDURE:  Procedure(s): LEFT CARPAL TUNNEL RELEASE (Left)  SURGEON:  Surgeon(s) and Role:    * Carole Civil, MD - Primary  PHYSICIAN ASSISTANT:   ASSISTANTS: none   ANESTHESIA:   regional  EBL:  Total I/O In: 300 [I.V.:300] Out: -   BLOOD ADMINISTERED:none  DRAINS: none   LOCAL MEDICATIONS USED:  MARCAINE    and Amount: 10 ml  SPECIMEN:  No Specimen  DISPOSITION OF SPECIMEN:  N/A  COUNTS:  YES  TOURNIQUET:  * Missing tourniquet times found for documented tourniquets in log:  [id number] *  DICTATION: .Dragon Dictation  PLAN OF CARE: Discharge to home after PACU  PATIENT DISPOSITION:  PACU - hemodynamically stable.   Delay start of Pharmacological VTE agent (>24hrs) due to surgical blood loss or risk of bleeding: not applicable

## 2013-07-25 NOTE — Discharge Instructions (Signed)
Carpal Tunnel Release (Repair), Care After Refer to this sheet in the next few weeks. These discharge instructions provide you with general information on caring for yourself after you leave the hospital. Your caregiver may also give you specific instructions. Your treatment has been planned according to the most current medical practices available, but unavoidable complications sometimes occur. If you have any problems or questions after discharge, please call your caregiver. HOME CARE INSTRUCTIONS   Have a responsible person with you for 24 hours.  Do not drive a car or take public transportation for 24 hours.  Only take over-the-counter or prescription medicines for pain, discomfort, or fever as directed by your caregiver. Take them as directed.  You may put ice on the palm side of the affected wrist.  Put ice in a plastic bag.  Place a towel between your skin and the bag.  Leave the ice on for 15-20 minutes, 03-04 times per day.  If you were given a splint to keep your wrist from bending, use it as directed. It is important to wear the splint at night or as directed. Use the splint for as long as you have pain or numbness in your hand, arm, or wrist. This may take 1 to 2 months.  Keep your hand raised (elevated) above the level of your heart as much as possible. This keeps swelling down and helps with discomfort.  Change bandages (dressings) as directed.  Keep the wound clean and dry. SEEK MEDICAL CARE IF:   You develop pain not relieved with medicines.  You develop numbness of your hand.  You develop bleeding from your surgical site.  You have an oral temperature above 102 F (38.9 C).  You develop redness or swelling of the surgical site.  You develop new, unexplained problems. SEEK IMMEDIATE MEDICAL CARE IF:   You develop a rash.  You have difficulty breathing.  You develop any reaction or side effects to medicines given. MAKE SURE YOU:   Understand these  instructions.  Will watch your condition.  Will get help right away if you are not doing well or get worse. Document Released: 10/14/2004 Document Revised: 01/15/2013 Document Reviewed: 01/31/2007 [employer] Patient Information 2014 [employer].  

## 2013-07-25 NOTE — Anesthesia Procedure Notes (Signed)
Anesthesia Regional Block:  Bier block (IV Regional)  Pre-Anesthetic Checklist: ,, timeout performed, Correct Patient, Correct Site, Correct Laterality, Correct Procedure,, site marked, surgical consent,, at surgeon's request Needles:  Injection technique: Single-shot  Needle Type: Other      Needle Gauge: 22 and 22 G    Additional Needles: Bier block (IV Regional)  Nerve Stimulator or Paresthesia:   Additional Responses:  Pulse checked post tourniquet inflation. IV NSL discontinued post injection. Narrative:   Performed by: Personally     

## 2013-07-28 ENCOUNTER — Ambulatory Visit (INDEPENDENT_AMBULATORY_CARE_PROVIDER_SITE_OTHER): Payer: Medicaid Other | Admitting: Orthopedic Surgery

## 2013-07-28 ENCOUNTER — Encounter: Payer: Self-pay | Admitting: Orthopedic Surgery

## 2013-07-28 VITALS — BP 121/82 | Ht 71.0 in | Wt 225.0 lb

## 2013-07-28 DIAGNOSIS — G5600 Carpal tunnel syndrome, unspecified upper limb: Secondary | ICD-10-CM | POA: Insufficient documentation

## 2013-07-28 DIAGNOSIS — Z9889 Other specified postprocedural states: Secondary | ICD-10-CM

## 2013-07-28 MED ORDER — HYDROCODONE-ACETAMINOPHEN 7.5-325 MG PO TABS: 1.0000 | ORAL_TABLET | ORAL | Status: DC | PRN

## 2013-07-28 NOTE — Progress Notes (Signed)
Patient ID: Jamie [name], male   DOB: Jul 10, 1970, 43 y.o.   MRN: [id number]  Chief Complaint  Patient presents with  . Follow-up    Post op #1, left CTR. DOS 07-25-13. Recheck right CTR, DOS 06-27-13.    BP 121/82  Ht 5\' 11"  (1.803 m)  Wt 225 lb (102.059 kg)  BMI 31.39 kg/m2  Doing well dressing changed medication refill;  return for suture removal on the 30th

## 2013-07-28 NOTE — Patient Instructions (Signed)
Move fingers as tolerated   Keep clean and dry

## 2013-08-07 ENCOUNTER — Encounter: Payer: Self-pay | Admitting: Orthopedic Surgery

## 2013-08-07 ENCOUNTER — Ambulatory Visit (INDEPENDENT_AMBULATORY_CARE_PROVIDER_SITE_OTHER): Payer: Medicaid Other | Admitting: Orthopedic Surgery

## 2013-08-07 VITALS — BP 113/66 | Ht 71.0 in | Wt 225.0 lb

## 2013-08-07 DIAGNOSIS — Z9889 Other specified postprocedural states: Secondary | ICD-10-CM

## 2013-08-07 DIAGNOSIS — G5600 Carpal tunnel syndrome, unspecified upper limb: Secondary | ICD-10-CM

## 2013-08-07 MED ORDER — HYDROCODONE-ACETAMINOPHEN 5-325 MG PO TABS
1.0000 | ORAL_TABLET | Freq: Four times a day (QID) | ORAL | Status: DC | PRN
Start: 2013-08-07 — End: 2013-08-27

## 2013-08-07 NOTE — Patient Instructions (Signed)
Carpal Tunnel Release (Repair), Care After Refer to this sheet in the next few weeks. These discharge instructions provide you with general information on caring for yourself after you leave the hospital. Your caregiver may also give you specific instructions. Your treatment has been planned according to the most current medical practices available, but unavoidable complications sometimes occur. If you have any problems or questions after discharge, please call your caregiver. HOME CARE INSTRUCTIONS   Have a responsible person with you for 24 hours.  Do not drive a car or take public transportation for 24 hours.  Only take over-the-counter or prescription medicines for pain, discomfort, or fever as directed by your caregiver. Take them as directed.  You may put ice on the palm side of the affected wrist.  Put ice in a plastic bag.  Place a towel between your skin and the bag.  Leave the ice on for 15-20 minutes, 03-04 times per day.  If you were given a splint to keep your wrist from bending, use it as directed. It is important to wear the splint at night or as directed. Use the splint for as long as you have pain or numbness in your hand, arm, or wrist. This may take 1 to 2 months.  Keep your hand raised (elevated) above the level of your heart as much as possible. This keeps swelling down and helps with discomfort.  Change bandages (dressings) as directed.  Keep the wound clean and dry. SEEK MEDICAL CARE IF:   You develop pain not relieved with medicines.  You develop numbness of your hand.  You develop bleeding from your surgical site.  You have an oral temperature above 102 F (38.9 C).  You develop redness or swelling of the surgical site.  You develop new, unexplained problems. SEEK IMMEDIATE MEDICAL CARE IF:   You develop a rash.  You have difficulty breathing.  You develop any reaction or side effects to medicines given. MAKE SURE YOU:   Understand these  instructions.  Will watch your condition.  Will get help right away if you are not doing well or get worse. Document Released: 10/14/2004 Document Revised: 01/15/2013 Document Reviewed: 01/31/2007 [facility] Patient Information 2014 [place], [place].

## 2013-08-07 NOTE — Progress Notes (Signed)
Patient ID: Jamie [name], male   DOB: Mar 02, 1971, 43 y.o.   MRN: [id number] Chief Complaint  Patient presents with  . Follow-up    10 day recheck left CTR , sutures out, DOS 07/25/13      Incision is clean sutures are removed  Symptoms have  resolved   Range of motion is improving   Work on flexion   Return in 4 weeks

## 2013-08-27 ENCOUNTER — Other Ambulatory Visit: Payer: Self-pay | Admitting: Orthopedic Surgery

## 2013-08-27 ENCOUNTER — Telehealth: Payer: Self-pay | Admitting: Radiology

## 2013-08-27 MED ORDER — HYDROCODONE-ACETAMINOPHEN 5-325 MG PO TABS: 1.0000 | ORAL_TABLET | Freq: Four times a day (QID) | ORAL | Status: DC | PRN

## 2013-08-27 NOTE — Telephone Encounter (Signed)
Patient wants refill on Hydrocodone because his hand is bothering him.  [phone].

## 2013-08-28 ENCOUNTER — Telehealth: Payer: Self-pay | Admitting: *Deleted

## 2013-08-28 NOTE — Telephone Encounter (Signed)
Call received from [place], Pharmacist with [facility], patient received prescription from our office for [place] # 60 1 tab q6 hr on 08/27/13, and patient also received prescription for Norco 10/325 # 60 BID from Dr. Karie Kirks. I advised, per Dr. Aline Brochure,  for pharmacy to void our prescription for Norco 5/325 dated 08/27/13. Patient is to get pain medicine from Dr. Karie Kirks.

## 2013-08-29 ENCOUNTER — Other Ambulatory Visit: Payer: Self-pay | Admitting: Orthopedic Surgery

## 2013-08-29 MED ORDER — HYDROCODONE-ACETAMINOPHEN 5-325 MG PO TABS: 1.0000 | ORAL_TABLET | Freq: Four times a day (QID) | ORAL | Status: DC | PRN

## 2013-08-30 ENCOUNTER — Emergency Department (HOSPITAL_COMMUNITY)
Admission: EM | Admit: 2013-08-30 | Discharge: 2013-08-30 | Disposition: A | Discharge status: Home / Self Care | Payer: Medicaid Other | Attending: Emergency Medicine | Admitting: Emergency Medicine

## 2013-08-30 ENCOUNTER — Encounter (HOSPITAL_COMMUNITY): Payer: Self-pay | Admitting: Emergency Medicine

## 2013-08-30 DIAGNOSIS — I10 Essential (primary) hypertension: Secondary | ICD-10-CM | POA: Insufficient documentation

## 2013-08-30 DIAGNOSIS — Z87891 Personal history of nicotine dependence: Secondary | ICD-10-CM | POA: Insufficient documentation

## 2013-08-30 DIAGNOSIS — S39012A Strain of muscle, fascia and tendon of lower back, initial encounter: Secondary | ICD-10-CM

## 2013-08-30 DIAGNOSIS — M62830 Muscle spasm of back: Secondary | ICD-10-CM

## 2013-08-30 DIAGNOSIS — F411 Generalized anxiety disorder: Secondary | ICD-10-CM | POA: Insufficient documentation

## 2013-08-30 DIAGNOSIS — Y92410 Unspecified street and highway as the place of occurrence of the external cause: Secondary | ICD-10-CM | POA: Insufficient documentation

## 2013-08-30 DIAGNOSIS — M62838 Other muscle spasm: Secondary | ICD-10-CM | POA: Insufficient documentation

## 2013-08-30 DIAGNOSIS — Y9389 Activity, other specified: Secondary | ICD-10-CM | POA: Insufficient documentation

## 2013-08-30 DIAGNOSIS — Z79899 Other long term (current) drug therapy: Secondary | ICD-10-CM | POA: Insufficient documentation

## 2013-08-30 DIAGNOSIS — G8929 Other chronic pain: Secondary | ICD-10-CM | POA: Insufficient documentation

## 2013-08-30 DIAGNOSIS — S335XXA Sprain of ligaments of lumbar spine, initial encounter: Secondary | ICD-10-CM | POA: Insufficient documentation

## 2013-08-30 MED ORDER — HYDROCODONE-ACETAMINOPHEN 5-325 MG PO TABS
1.0000 | ORAL_TABLET | Freq: Once | ORAL | Status: AC
Start: 2013-08-30 — End: 2013-08-30
  Administered 2013-08-30: 1 via ORAL

## 2013-08-30 MED ORDER — HYDROCODONE-ACETAMINOPHEN 10-325 MG PO TABS: 1.0000 | ORAL_TABLET | Freq: Two times a day (BID) | ORAL | Status: DC

## 2013-08-30 MED ORDER — CYCLOBENZAPRINE HCL 5 MG PO TABS: 5.0000 mg | ORAL_TABLET | Freq: Three times a day (TID) | ORAL | Status: DC | PRN

## 2013-08-30 MED ORDER — HYDROCODONE-ACETAMINOPHEN 5-325 MG PO TABS
ORAL_TABLET | ORAL | Status: AC
  Administered 2013-08-30: 1 via ORAL
  Filled 2013-08-30: qty 1

## 2013-08-30 NOTE — ED Notes (Signed)
Patient with no complaints at this time. Respirations even and unlabored. Skin warm/dry. Discharge instructions reviewed with patient at this time. Patient given opportunity to voice concerns/ask questions. Patient discharged at this time and left Emergency Department with steady gait.   

## 2013-08-30 NOTE — Discharge Instructions (Signed)
Lumbosacral Strain Lumbosacral strain is a strain of any of the parts that make up your lumbosacral vertebrae. Your lumbosacral vertebrae are the bones that make up the lower third of your backbone. Your lumbosacral vertebrae are held together by muscles and tough, fibrous tissue (ligaments).  CAUSES  A sudden blow to your back can cause lumbosacral strain. Also, anything that causes an excessive stretch of the muscles in the low back can cause this strain. This is typically seen when people exert themselves strenuously, fall, lift heavy objects, bend, or crouch repeatedly. RISK FACTORS  Physically demanding work.  Participation in pushing or pulling sports or sports that require sudden twist of the back (tennis, golf, baseball).  Weight lifting.  Excessive lower back curvature.  Forward-tilted pelvis.  Weak back or abdominal muscles or both.  Tight hamstrings. SIGNS AND SYMPTOMS  Lumbosacral strain may cause pain in the area of your injury or pain that moves (radiates) down your leg.  DIAGNOSIS Your health care provider can often diagnose lumbosacral strain through a physical exam. In some cases, you may need tests such as X-ray exams.  TREATMENT  Treatment for your lower back injury depends on many factors that your clinician will have to evaluate. However, most treatment will include the use of anti-inflammatory medicines. HOME CARE INSTRUCTIONS   Avoid hard physical activities (tennis, racquetball, waterskiing) if you are not in proper physical condition for it. This may aggravate or create problems.  If you have a back problem, avoid sports requiring sudden body movements. Swimming and walking are generally safer activities.  Maintain good posture.  Maintain a healthy weight.  For acute conditions, you may put ice on the injured area.  Put ice in a plastic bag.  Place a towel between your skin and the bag.  Leave the ice on for 20 minutes, 2 3 times a day.  When the  low back starts healing, stretching and strengthening exercises may be recommended. SEEK MEDICAL CARE IF:  Your back pain is getting worse.  You experience severe back pain not relieved with medicines. SEEK IMMEDIATE MEDICAL CARE IF:   You have numbness, tingling, weakness, or problems with the use of your arms or legs.  There is a change in bowel or bladder control.  You have increasing pain in any area of the body, including your belly (abdomen).  You notice shortness of breath, dizziness, or feel faint.  You feel sick to your stomach (nauseous), are throwing up (vomiting), or become sweaty.  You notice discoloration of your toes or legs, or your feet get very cold. MAKE SURE YOU:   Understand these instructions.  Will watch your condition.  Will get help right away if you are not doing well or get worse. Document Released: 01/04/2005 Document Revised: 01/15/2013 Document Reviewed: 11/13/2012 [facility] Patient Information 2014 [place], [place].  Muscle Strain A muscle strain is an injury that occurs when a muscle is stretched beyond its normal length. Usually a small number of muscle fibers are torn when this happens. Muscle strain is rated in degrees. First-degree strains have the least amount of muscle fiber tearing and pain. Second-degree and third-degree strains have increasingly more tearing and pain.  Usually, recovery from muscle strain takes 1 2 weeks. Complete healing takes 5 6 weeks.  CAUSES  Muscle strain happens when a sudden, violent force placed on a muscle stretches it too far. This may occur with lifting, sports, or a fall.  RISK FACTORS Muscle strain is especially common in athletes.  SIGNS AND SYMPTOMS At the site of the muscle strain, there may be:  Pain.  Bruising.  Swelling.  Difficulty using the muscle due to pain or lack of normal function. DIAGNOSIS  Your health care provider will perform a physical exam and ask about your medical  history. TREATMENT  Often, the best treatment for a muscle strain is resting, icing, and applying cold compresses to the injured area.  HOME CARE INSTRUCTIONS   Use the PRICE method of treatment to promote muscle healing during the first 2 3 days after your injury. The PRICE method involves:  Protecting the muscle from being injured again.  Restricting your activity and resting the injured body part.  Icing your injury. To do this, put ice in a plastic bag. Place a towel between your skin and the bag. Then, apply the ice and leave it on from 15 20 minutes each hour. After the third day, switch to moist heat packs.  Apply compression to the injured area with a splint or elastic bandage. Be careful not to wrap it too tightly. This may interfere with blood circulation or increase swelling.  Elevate the injured body part above the level of your heart as often as you can.  Only take over-the-counter or prescription medicines for pain, discomfort, or fever as directed by your health care provider.  Warming up prior to exercise helps to prevent future muscle strains. SEEK MEDICAL CARE IF:   You have increasing pain or swelling in the injured area.  You have numbness, tingling, or a significant loss of strength in the injured area. MAKE SURE YOU:   Understand these instructions.  Will watch your condition.  Will get help right away if you are not doing well or get worse. Document Released: 03/27/2005 Document Revised: 01/15/2013 Document Reviewed: 10/24/2012 [facility] Patient Information 2014 [place], [place].   Take medications as prescribed, use caution as they will make you drowsy.  Do not drive within 4 hours of taking either medicine.  Followup with Dr. Karie Kirks on Tuesday as you are planning for further management of your chronic pain issues.  Apply heating pad to your lower back for 20 minutes 3-4 times daily.
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
## 2013-09-01 NOTE — ED Provider Notes (Signed)
Medical screening examination/treatment/procedure(s) were performed by non-physician practitioner and as supervising physician I was immediately available for consultation/collaboration.   EKG Interpretation None       Orlie Dakin, MD 09/01/13 1705
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
## 2013-09-08 ENCOUNTER — Ambulatory Visit: Payer: Medicaid Other | Admitting: Orthopedic Surgery

## 2013-09-23 ENCOUNTER — Ambulatory Visit (HOSPITAL_COMMUNITY)
Admission: RE | Admit: 2013-09-23 | Discharge: 2013-09-23 | Disposition: A | Discharge status: Home / Self Care | Payer: Medicaid Other | Source: Ambulatory Visit | Attending: Internal Medicine | Admitting: Internal Medicine

## 2013-09-23 ENCOUNTER — Other Ambulatory Visit (HOSPITAL_COMMUNITY): Payer: Self-pay | Admitting: Internal Medicine

## 2013-09-23 DIAGNOSIS — M47817 Spondylosis without myelopathy or radiculopathy, lumbosacral region: Secondary | ICD-10-CM | POA: Insufficient documentation

## 2013-09-23 DIAGNOSIS — M5430 Sciatica, unspecified side: Secondary | ICD-10-CM

## 2013-09-25 ENCOUNTER — Emergency Department (HOSPITAL_COMMUNITY)
Admission: EM | Admit: 2013-09-25 | Discharge: 2013-09-25 | Disposition: A | Discharge status: Home / Self Care | Payer: Medicaid Other | Attending: Emergency Medicine | Admitting: Emergency Medicine

## 2013-09-25 ENCOUNTER — Emergency Department (HOSPITAL_COMMUNITY): Payer: Medicaid Other

## 2013-09-25 ENCOUNTER — Encounter (HOSPITAL_COMMUNITY): Payer: Self-pay | Admitting: Emergency Medicine

## 2013-09-25 DIAGNOSIS — I10 Essential (primary) hypertension: Secondary | ICD-10-CM | POA: Insufficient documentation

## 2013-09-25 DIAGNOSIS — M5126 Other intervertebral disc displacement, lumbar region: Secondary | ICD-10-CM | POA: Insufficient documentation

## 2013-09-25 DIAGNOSIS — Z9889 Other specified postprocedural states: Secondary | ICD-10-CM | POA: Insufficient documentation

## 2013-09-25 DIAGNOSIS — Z79899 Other long term (current) drug therapy: Secondary | ICD-10-CM | POA: Insufficient documentation

## 2013-09-25 DIAGNOSIS — F411 Generalized anxiety disorder: Secondary | ICD-10-CM | POA: Insufficient documentation

## 2013-09-25 DIAGNOSIS — Z87891 Personal history of nicotine dependence: Secondary | ICD-10-CM | POA: Insufficient documentation

## 2013-09-25 MED ORDER — HYDROMORPHONE HCL PF 1 MG/ML IJ SOLN
1.0000 mg | Freq: Once | INTRAMUSCULAR | Status: AC
  Administered 2013-09-25: 1 mg via INTRAMUSCULAR
  Filled 2013-09-25: qty 1

## 2013-09-25 MED ORDER — HYDROMORPHONE HCL PF 1 MG/ML IJ SOLN: 1.0000 mg | Freq: Once | INTRAMUSCULAR | Status: DC

## 2013-09-25 MED ORDER — METHYLPREDNISOLONE 4 MG PO KIT: PACK | ORAL | Status: DC

## 2013-09-25 MED ORDER — HYDROMORPHONE HCL PF 2 MG/ML IJ SOLN
INTRAMUSCULAR | Status: AC
  Administered 2013-09-25: 1 mg
  Filled 2013-09-25: qty 1

## 2013-09-25 MED ORDER — HYDROCODONE-ACETAMINOPHEN 10-325 MG PO TABS: 1.0000 | ORAL_TABLET | Freq: Four times a day (QID) | ORAL | Status: DC | PRN

## 2013-09-25 NOTE — ED Provider Notes (Signed)
10:06 AM Patient and mother aware of all results. Patient is moving all extremities, though he continues to complain of pain in the left lower extremity. I have discussed the patient's case with our neurosurgical colleagues, Dr. Arnoldo Morale. He will see the patient tomorrow in his office.   Carmin Muskrat, MD 09/25/13 1007
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
## 2013-09-25 NOTE — ED Notes (Signed)
Pt c/o left leg pain all night. Pt states the leg throbs from hip to the knee.

## 2013-09-25 NOTE — Discharge Instructions (Signed)
As discussed, it is very important that you follow up with our neurosurgeon tomorrow for appropriate ongoing management of your spine disease.  Return here for any concerning changes in your condition.

## 2013-09-30 ENCOUNTER — Other Ambulatory Visit: Payer: Self-pay | Admitting: Neurosurgery

## 2013-10-02 ENCOUNTER — Other Ambulatory Visit (HOSPITAL_COMMUNITY): Payer: Self-pay | Admitting: *Deleted

## 2013-10-02 NOTE — Addendum Note (Signed)
Addended by: Derl Barrow on: 10/02/2013 05:28 PM   Modules accepted: Orders

## 2013-10-02 NOTE — Pre-Procedure Instructions (Addendum)
Jamie [name]  10/02/2013   Your procedure is scheduled on:  Monday, October 06, 2013    Report to [facility] Entrance "A" Admitting Office at 3:00 PM.   Call this number if you have problems the morning of surgery: [phone]   Remember:   Do not eat food or drink liquids after midnight.   Take these medicines the morning of surgery with A SIP OF WATER: clonazePAM (KLONOPIN),  FLUoxetine (PROZAC), methylPREDNISolone (MEDROL DOSEPAK), HYDROcodone-acetaminophen (NORCO) - if needed, cyclobenzaprine (FLEXERIL) -          STOP all herbel meds, nsaids (aleve,naproxen,advil,ibuprofen) today also any vitamins, aspirin    Do not wear jewelry.  Do not wear lotions, powders, or cologne. You may wear deodorant.  Men may shave face and neck.  Do not bring valuables to the hospital.  [facility] is not responsible                  for any belongings or valuables.               Contacts, dentures or bridgework may not be worn into surgery.  Leave suitcase in the car. After surgery it may be brought to your room.  For patients admitted to the hospital, discharge time is determined by your                treatment team.               Special Instructions: [place] - Preparing for Surgery  Before surgery, you can play an important role.  Because skin is not sterile, your skin needs to be as free of germs as possible.  You can reduce the number of germs on you skin by washing with CHG (chlorahexidine gluconate) soap before surgery.  CHG is an antiseptic cleaner which kills germs and bonds with the skin to continue killing germs even after washing.  Please DO NOT use if you have an allergy to CHG or antibacterial soaps.  If your skin becomes reddened/irritated stop using the CHG and inform your nurse when you arrive at Short Stay.  Do not shave (including legs and underarms) for at least 48 hours prior to the first CHG shower.  You may shave your face.  Please follow these instructions  carefully:   1.  Shower with CHG Soap the night before surgery and the                                morning of Surgery.  2.  If you choose to wash your hair, wash your hair first as usual with your       normal shampoo.  3.  After you shampoo, rinse your hair and body thoroughly to remove the                      Shampoo.  4.  Use CHG as you would any other liquid soap.  You can apply chg directly       to the skin and wash gently with scrungie or a clean washcloth.  5.  Apply the CHG Soap to your body ONLY FROM THE NECK DOWN.        Do not use on open wounds or open sores.  Avoid contact with your eyes, ears, mouth and genitals (private parts).  Wash genitals (private parts) with your normal soap.  6.  Wash  thoroughly, paying special attention to the area where your surgery        will be performed.  7.  Thoroughly rinse your body with warm water from the neck down.  8.  DO NOT shower/wash with your normal soap after using and rinsing off       the CHG Soap.  9.  Pat yourself dry with a clean towel.            10.  Wear clean pajamas.            11.  Place clean sheets on your bed the night of your first shower and do not        sleep with pets.  Day of Surgery  Do not apply any lotions/deoderants the morning of surgery.  Please wear clean clothes to the hospital/surgery center.     Please read over the following fact sheets that you were given: Pain Booklet, Coughing and Deep Breathing, MRSA Information and Surgical Site Infection Prevention

## 2013-10-03 ENCOUNTER — Encounter (HOSPITAL_COMMUNITY)
Admission: RE | Admit: 2013-10-03 | Discharge: 2013-10-03 | Disposition: A | Discharge status: Home / Self Care | Payer: Medicaid Other | Source: Ambulatory Visit | Attending: Anesthesiology | Admitting: Anesthesiology

## 2013-10-03 ENCOUNTER — Encounter (HOSPITAL_COMMUNITY): Payer: Self-pay

## 2013-10-03 ENCOUNTER — Encounter (HOSPITAL_COMMUNITY)
Admission: RE | Admit: 2013-10-03 | Discharge: 2013-10-03 | Disposition: A | Discharge status: Home / Self Care | Payer: Medicaid Other | Source: Ambulatory Visit | Attending: Neurosurgery | Admitting: Neurosurgery

## 2013-10-03 HISTORY — DX: Gastro-esophageal reflux disease without esophagitis: K21.9

## 2013-10-03 LAB — CBC
HCT: 44.1 % (ref 39.0–52.0)
Hemoglobin: 15.5 g/dL (ref 13.0–17.0)
MCH: 31.8 pg (ref 26.0–34.0)
MCHC: 35.1 g/dL (ref 30.0–36.0)
MCV: 90.6 fL (ref 78.0–100.0)
Platelets: 247 10*3/uL (ref 150–400)
RBC: 4.87 MIL/uL (ref 4.22–5.81)
RDW: 13.3 % (ref 11.5–15.5)
WBC: 8.7 10*3/uL (ref 4.0–10.5)

## 2013-10-03 LAB — SURGICAL PCR SCREEN
MRSA, PCR: NEGATIVE
Staphylococcus aureus: NEGATIVE

## 2013-10-05 MED ORDER — CEFAZOLIN SODIUM-DEXTROSE 2-3 GM-% IV SOLR
2.0000 g | INTRAVENOUS | Status: AC
  Administered 2013-10-06: 2 g via INTRAVENOUS
  Filled 2013-10-05: qty 50

## 2013-10-06 ENCOUNTER — Ambulatory Visit (HOSPITAL_COMMUNITY): Payer: Medicaid Other | Admitting: Anesthesiology

## 2013-10-06 ENCOUNTER — Encounter (HOSPITAL_COMMUNITY): Payer: Medicaid Other | Admitting: Anesthesiology

## 2013-10-06 ENCOUNTER — Encounter (HOSPITAL_COMMUNITY): Payer: Self-pay | Admitting: Anesthesiology

## 2013-10-06 ENCOUNTER — Encounter (HOSPITAL_COMMUNITY)
Admission: RE | Disposition: A | Discharge status: Home / Self Care | Payer: Self-pay | Source: Ambulatory Visit | Attending: Neurosurgery

## 2013-10-06 ENCOUNTER — Ambulatory Visit (HOSPITAL_COMMUNITY): Payer: Medicaid Other

## 2013-10-06 ENCOUNTER — Observation Stay (HOSPITAL_COMMUNITY)
Admission: RE | Admit: 2013-10-06 | Discharge: 2013-10-07 | Disposition: A | Discharge status: Home / Self Care | Payer: Medicaid Other | Source: Ambulatory Visit | Attending: Neurosurgery | Admitting: Neurosurgery

## 2013-10-06 DIAGNOSIS — Z79899 Other long term (current) drug therapy: Secondary | ICD-10-CM | POA: Insufficient documentation

## 2013-10-06 DIAGNOSIS — Z01818 Encounter for other preprocedural examination: Secondary | ICD-10-CM | POA: Insufficient documentation

## 2013-10-06 DIAGNOSIS — Z01812 Encounter for preprocedural laboratory examination: Secondary | ICD-10-CM | POA: Insufficient documentation

## 2013-10-06 DIAGNOSIS — R339 Retention of urine, unspecified: Secondary | ICD-10-CM | POA: Insufficient documentation

## 2013-10-06 DIAGNOSIS — M5126 Other intervertebral disc displacement, lumbar region: Principal | ICD-10-CM | POA: Diagnosis present

## 2013-10-06 DIAGNOSIS — Z87891 Personal history of nicotine dependence: Secondary | ICD-10-CM | POA: Insufficient documentation

## 2013-10-06 DIAGNOSIS — I10 Essential (primary) hypertension: Secondary | ICD-10-CM | POA: Insufficient documentation

## 2013-10-06 DIAGNOSIS — F411 Generalized anxiety disorder: Secondary | ICD-10-CM | POA: Insufficient documentation

## 2013-10-06 DIAGNOSIS — K219 Gastro-esophageal reflux disease without esophagitis: Secondary | ICD-10-CM | POA: Insufficient documentation

## 2013-10-06 HISTORY — PX: LUMBAR LAMINECTOMY/DECOMPRESSION MICRODISCECTOMY: SHX5026

## 2013-10-06 SURGERY — LUMBAR LAMINECTOMY/DECOMPRESSION MICRODISCECTOMY 1 LEVEL
Anesthesia: General | Laterality: Left

## 2013-10-06 MED ORDER — PHENOL 1.4 % MT LIQD: 1.0000 | OROMUCOSAL | Status: DC | PRN

## 2013-10-06 MED ORDER — ACETAMINOPHEN 325 MG PO TABS: 650.0000 mg | ORAL_TABLET | ORAL | Status: DC | PRN

## 2013-10-06 MED ORDER — ARTIFICIAL TEARS OP OINT
TOPICAL_OINTMENT | OPHTHALMIC | Status: DC | PRN
Start: 2013-10-06 — End: 2013-10-06
  Administered 2013-10-06: 1 via OPHTHALMIC

## 2013-10-06 MED ORDER — LIDOCAINE HCL (CARDIAC) 20 MG/ML IV SOLN
INTRAVENOUS | Status: DC | PRN
  Administered 2013-10-06: 100 mg via INTRAVENOUS

## 2013-10-06 MED ORDER — ROCURONIUM BROMIDE 50 MG/5ML IV SOLN
INTRAVENOUS | Status: AC
  Filled 2013-10-06: qty 1

## 2013-10-06 MED ORDER — ALUM & MAG HYDROXIDE-SIMETH 200-200-20 MG/5ML PO SUSP: 30.0000 mL | Freq: Four times a day (QID) | ORAL | Status: DC | PRN

## 2013-10-06 MED ORDER — PROMETHAZINE HCL 25 MG/ML IJ SOLN: 6.2500 mg | INTRAMUSCULAR | Status: DC | PRN

## 2013-10-06 MED ORDER — LACTATED RINGERS IV SOLN
INTRAVENOUS | Status: DC
  Administered 2013-10-06: 15:00:00 via INTRAVENOUS

## 2013-10-06 MED ORDER — ARTIFICIAL TEARS OP OINT
TOPICAL_OINTMENT | OPHTHALMIC | Status: AC
  Filled 2013-10-06: qty 3.5

## 2013-10-06 MED ORDER — LACTATED RINGERS IV SOLN: INTRAVENOUS | Status: DC

## 2013-10-06 MED ORDER — ONDANSETRON HCL 4 MG/2ML IJ SOLN: 4.0000 mg | INTRAMUSCULAR | Status: DC | PRN

## 2013-10-06 MED ORDER — BUPIVACAINE-EPINEPHRINE (PF) 0.5% -1:200000 IJ SOLN
INTRAMUSCULAR | Status: DC | PRN
  Administered 2013-10-06 (×2): 10 mL via PERINEURAL

## 2013-10-06 MED ORDER — SODIUM CHLORIDE 0.9 % IR SOLN
Status: DC | PRN
  Administered 2013-10-06: 17:00:00

## 2013-10-06 MED ORDER — FLUOXETINE HCL 20 MG PO CAPS
20.0000 mg | ORAL_CAPSULE | Freq: Every day | ORAL | Status: DC
  Administered 2013-10-07: 20 mg via ORAL
  Filled 2013-10-06: qty 1

## 2013-10-06 MED ORDER — DOXEPIN HCL 10 MG PO CAPS
10.0000 mg | ORAL_CAPSULE | Freq: Three times a day (TID) | ORAL | Status: DC
  Administered 2013-10-06 – 2013-10-07 (×2): 10 mg via ORAL
  Filled 2013-10-06 (×4): qty 1

## 2013-10-06 MED ORDER — THROMBIN 5000 UNITS EX SOLR
CUTANEOUS | Status: DC | PRN
  Administered 2013-10-06 (×2): 5000 [IU] via TOPICAL

## 2013-10-06 MED ORDER — LACTATED RINGERS IV SOLN
INTRAVENOUS | Status: DC | PRN
  Administered 2013-10-06 (×2): via INTRAVENOUS

## 2013-10-06 MED ORDER — MENTHOL 3 MG MT LOZG: 1.0000 | LOZENGE | OROMUCOSAL | Status: DC | PRN

## 2013-10-06 MED ORDER — CLONAZEPAM 0.5 MG PO TABS
1.0000 mg | ORAL_TABLET | Freq: Three times a day (TID) | ORAL | Status: DC
  Administered 2013-10-06 – 2013-10-07 (×3): 1 mg via ORAL
  Filled 2013-10-06 (×3): qty 2

## 2013-10-06 MED ORDER — NEOSTIGMINE METHYLSULFATE 10 MG/10ML IV SOLN
INTRAVENOUS | Status: DC | PRN
  Administered 2013-10-06: 3 mg via INTRAVENOUS

## 2013-10-06 MED ORDER — MIDAZOLAM HCL 2 MG/2ML IJ SOLN
INTRAMUSCULAR | Status: AC
  Filled 2013-10-06: qty 2

## 2013-10-06 MED ORDER — ACETAMINOPHEN 650 MG RE SUPP: 650.0000 mg | RECTAL | Status: DC | PRN

## 2013-10-06 MED ORDER — PROPOFOL 10 MG/ML IV BOLUS
INTRAVENOUS | Status: AC
  Filled 2013-10-06: qty 20

## 2013-10-06 MED ORDER — DIAZEPAM 5 MG PO TABS
5.0000 mg | ORAL_TABLET | Freq: Four times a day (QID) | ORAL | Status: DC | PRN
  Administered 2013-10-06 – 2013-10-07 (×3): 5 mg via ORAL
  Filled 2013-10-06 (×3): qty 1

## 2013-10-06 MED ORDER — FENTANYL CITRATE 0.05 MG/ML IJ SOLN
INTRAMUSCULAR | Status: DC | PRN
  Administered 2013-10-06 (×2): 50 ug via INTRAVENOUS
  Administered 2013-10-06: 100 ug via INTRAVENOUS
  Administered 2013-10-06: 50 ug via INTRAVENOUS

## 2013-10-06 MED ORDER — FENTANYL CITRATE 0.05 MG/ML IJ SOLN
INTRAMUSCULAR | Status: AC
  Filled 2013-10-06: qty 5

## 2013-10-06 MED ORDER — MIDAZOLAM HCL 5 MG/5ML IJ SOLN
INTRAMUSCULAR | Status: DC | PRN
  Administered 2013-10-06: 2 mg via INTRAVENOUS

## 2013-10-06 MED ORDER — DOCUSATE SODIUM 100 MG PO CAPS
100.0000 mg | ORAL_CAPSULE | Freq: Two times a day (BID) | ORAL | Status: DC
  Administered 2013-10-06 – 2013-10-07 (×2): 100 mg via ORAL
  Filled 2013-10-06 (×3): qty 1

## 2013-10-06 MED ORDER — BACITRACIN ZINC 500 UNIT/GM EX OINT
TOPICAL_OINTMENT | CUTANEOUS | Status: DC | PRN
  Administered 2013-10-06: 1 via TOPICAL

## 2013-10-06 MED ORDER — ONDANSETRON HCL 4 MG/2ML IJ SOLN
INTRAMUSCULAR | Status: DC | PRN
  Administered 2013-10-06: 4 mg via INTRAVENOUS

## 2013-10-06 MED ORDER — NEOSTIGMINE METHYLSULFATE 10 MG/10ML IV SOLN
INTRAVENOUS | Status: AC
  Filled 2013-10-06: qty 1

## 2013-10-06 MED ORDER — 0.9 % SODIUM CHLORIDE (POUR BTL) OPTIME
TOPICAL | Status: DC | PRN
  Administered 2013-10-06: 1000 mL

## 2013-10-06 MED ORDER — GLYCOPYRROLATE 0.2 MG/ML IJ SOLN
INTRAMUSCULAR | Status: DC | PRN
  Administered 2013-10-06: 0.4 mg via INTRAVENOUS

## 2013-10-06 MED ORDER — CEFAZOLIN SODIUM-DEXTROSE 2-3 GM-% IV SOLR
2.0000 g | Freq: Three times a day (TID) | INTRAVENOUS | Status: AC
  Administered 2013-10-06 – 2013-10-07 (×2): 2 g via INTRAVENOUS
  Filled 2013-10-06 (×2): qty 50

## 2013-10-06 MED ORDER — HYDROMORPHONE HCL PF 1 MG/ML IJ SOLN: 0.2500 mg | INTRAMUSCULAR | Status: DC | PRN

## 2013-10-06 MED ORDER — ROCURONIUM BROMIDE 100 MG/10ML IV SOLN
INTRAVENOUS | Status: DC | PRN
  Administered 2013-10-06: 50 mg via INTRAVENOUS

## 2013-10-06 MED ORDER — HYDROCODONE-ACETAMINOPHEN 5-325 MG PO TABS: 1.0000 | ORAL_TABLET | ORAL | Status: DC | PRN

## 2013-10-06 MED ORDER — GLYCOPYRROLATE 0.2 MG/ML IJ SOLN
INTRAMUSCULAR | Status: AC
  Filled 2013-10-06: qty 2

## 2013-10-06 MED ORDER — HYDROCODONE-ACETAMINOPHEN 10-325 MG PO TABS: 1.0000 | ORAL_TABLET | ORAL | Status: DC | PRN

## 2013-10-06 MED ORDER — PROPOFOL 10 MG/ML IV BOLUS
INTRAVENOUS | Status: DC | PRN
  Administered 2013-10-06: 200 mg via INTRAVENOUS

## 2013-10-06 MED ORDER — OXYCODONE-ACETAMINOPHEN 5-325 MG PO TABS
1.0000 | ORAL_TABLET | ORAL | Status: DC | PRN
  Administered 2013-10-06 – 2013-10-07 (×6): 2 via ORAL
  Filled 2013-10-06 (×6): qty 2

## 2013-10-06 MED ORDER — HEMOSTATIC AGENTS (NO CHARGE) OPTIME
TOPICAL | Status: DC | PRN
  Administered 2013-10-06: 1 via TOPICAL

## 2013-10-06 MED ORDER — MORPHINE SULFATE 2 MG/ML IJ SOLN: 1.0000 mg | INTRAMUSCULAR | Status: DC | PRN

## 2013-10-06 MED ORDER — ONDANSETRON HCL 4 MG/2ML IJ SOLN
INTRAMUSCULAR | Status: AC
  Filled 2013-10-06: qty 2

## 2013-10-06 SURGICAL SUPPLY — 53 items
BAG DECANTER FOR FLEXI CONT (MISCELLANEOUS) ×3 IMPLANT
BENZOIN TINCTURE PRP APPL 2/3 (GAUZE/BANDAGES/DRESSINGS) ×3 IMPLANT
BLADE 10 SAFETY STRL DISP (BLADE) ×3 IMPLANT
BLADE SURG ROTATE 9660 (MISCELLANEOUS) IMPLANT
BRUSH SCRUB EZ PLAIN DRY (MISCELLANEOUS) ×3 IMPLANT
BUR ACORN 6.0 (BURR) ×2 IMPLANT
BUR ACORN 6.0MM (BURR) ×1
BUR MATCHSTICK NEURO 3.0 LAGG (BURR) ×3 IMPLANT
CANISTER SUCT 3000ML (MISCELLANEOUS) ×3 IMPLANT
CLOSURE WOUND 1/2 X4 (GAUZE/BANDAGES/DRESSINGS) ×1
CONT SPEC 4OZ CLIKSEAL STRL BL (MISCELLANEOUS) ×3 IMPLANT
DRAPE LAPAROTOMY 100X72X124 (DRAPES) ×3 IMPLANT
DRAPE MICROSCOPE LEICA (MISCELLANEOUS) ×3 IMPLANT
DRAPE POUCH INSTRU U-SHP 10X18 (DRAPES) ×3 IMPLANT
DRAPE SURG 17X23 STRL (DRAPES) ×12 IMPLANT
ELECT BLADE 4.0 EZ CLEAN MEGAD (MISCELLANEOUS) ×3
ELECT REM PT RETURN 9FT ADLT (ELECTROSURGICAL) ×3
ELECTRODE BLDE 4.0 EZ CLN MEGD (MISCELLANEOUS) ×1 IMPLANT
ELECTRODE REM PT RTRN 9FT ADLT (ELECTROSURGICAL) ×1 IMPLANT
GAUZE SPONGE 4X4 16PLY XRAY LF (GAUZE/BANDAGES/DRESSINGS) IMPLANT
GLOVE BIO SURGEON STRL SZ8.5 (GLOVE) ×3 IMPLANT
GLOVE EXAM NITRILE LRG STRL (GLOVE) IMPLANT
GLOVE EXAM NITRILE MD LF STRL (GLOVE) ×3 IMPLANT
GLOVE EXAM NITRILE XL STR (GLOVE) IMPLANT
GLOVE EXAM NITRILE XS STR PU (GLOVE) IMPLANT
GLOVE SS BIOGEL STRL SZ 8 (GLOVE) ×1 IMPLANT
GLOVE SUPERSENSE BIOGEL SZ 8 (GLOVE) ×2
GOWN STRL REUS W/ TWL LRG LVL3 (GOWN DISPOSABLE) ×1 IMPLANT
GOWN STRL REUS W/ TWL XL LVL3 (GOWN DISPOSABLE) ×1 IMPLANT
GOWN STRL REUS W/TWL 2XL LVL3 (GOWN DISPOSABLE) IMPLANT
GOWN STRL REUS W/TWL LRG LVL3 (GOWN DISPOSABLE) ×2
GOWN STRL REUS W/TWL XL LVL3 (GOWN DISPOSABLE) ×2
KIT BASIN OR (CUSTOM PROCEDURE TRAY) ×3 IMPLANT
KIT ROOM TURNOVER OR (KITS) ×3 IMPLANT
NEEDLE HYPO 21X1.5 SAFETY (NEEDLE) IMPLANT
NEEDLE HYPO 22GX1.5 SAFETY (NEEDLE) ×3 IMPLANT
NS IRRIG 1000ML POUR BTL (IV SOLUTION) ×3 IMPLANT
PACK LAMINECTOMY NEURO (CUSTOM PROCEDURE TRAY) ×3 IMPLANT
PAD ARMBOARD 7.5X6 YLW CONV (MISCELLANEOUS) ×9 IMPLANT
PATTIES SURGICAL .5 X1 (DISPOSABLE) IMPLANT
RUBBERBAND STERILE (MISCELLANEOUS) ×6 IMPLANT
SPONGE GAUZE 4X4 12PLY (GAUZE/BANDAGES/DRESSINGS) ×3 IMPLANT
SPONGE SURGIFOAM ABS GEL SZ50 (HEMOSTASIS) ×3 IMPLANT
STRIP CLOSURE SKIN 1/2X4 (GAUZE/BANDAGES/DRESSINGS) ×2 IMPLANT
SUT VIC AB 1 CT1 18XBRD ANBCTR (SUTURE) ×1 IMPLANT
SUT VIC AB 1 CT1 8-18 (SUTURE) ×2
SUT VIC AB 2-0 CP2 18 (SUTURE) ×3 IMPLANT
SYR 20CC LL (SYRINGE) IMPLANT
SYR 20ML ECCENTRIC (SYRINGE) ×3 IMPLANT
TAPE CLOTH SURG 4X10 WHT LF (GAUZE/BANDAGES/DRESSINGS) ×3 IMPLANT
TOWEL OR 17X24 6PK STRL BLUE (TOWEL DISPOSABLE) ×3 IMPLANT
TOWEL OR 17X26 10 PK STRL BLUE (TOWEL DISPOSABLE) ×3 IMPLANT
WATER STERILE IRR 1000ML POUR (IV SOLUTION) ×3 IMPLANT

## 2013-10-06 NOTE — Plan of Care (Signed)
Problem: Consults Goal: Diagnosis - Spinal Surgery Outcome: Completed/Met Date Met:  10/06/13 Microdiscectomy     

## 2013-10-06 NOTE — Progress Notes (Signed)
Pt complained of not emptying his bladder well, PVR showed 711ml. In and out cath done PRN as ordered and drained 1077ml of clear yellow urine. Will continue to monitor.

## 2013-10-06 NOTE — Progress Notes (Signed)
Subjective:  The patient is somnolent but easily arousable. He is in no apparent distress.  Objective: Vital signs in last 24 hours: Temp:  [97.2 F (36.2 C)] 97.2 F (36.2 C) (06/29 1445) Pulse Rate:  [51] 51 (06/29 1445) Resp:  [20] 20 (06/29 1445) BP: (133)/(80) 133/80 mmHg (06/29 1445) SpO2:  [97 %] 97 % (06/29 1445) Weight:  [99.791 kg (220 lb)] 99.791 kg (220 lb) (06/29 1445)  Intake/Output from previous day:   Intake/Output this shift: Total I/O In: 1300 [I.V.:1300] Out: -   Physical exam the patient is moving his lower extremities well.  Lab Results: No results found for this basename: WBC, HGB, HCT, PLT,  in the last 72 hours BMET No results found for this basename: NA, K, CL, CO2, GLUCOSE, BUN, CREATININE, CALCIUM,  in the last 72 hours  Studies/Results: No results found.  Assessment/Plan: The patient is doing well.  LOS: 0 days     JENKINS,JEFFREY D 10/06/2013, 6:22 PM

## 2013-10-06 NOTE — Transfer of Care (Signed)
Immediate Anesthesia Transfer of Care Note  Patient: Jamie [name]  Procedure(s) Performed: Procedure(s) with comments: Left Lumbar Three-four microdiskectomy (Left) - Left Lumbar Three-four microdiskectomy  Patient Location: PACU  Anesthesia Type:General  Level of Consciousness: awake, alert , oriented and patient cooperative  Airway & Oxygen Therapy: Patient Spontanous Breathing and Patient connected to nasal cannula oxygen  Post-op Assessment: Report given to PACU RN, Post -op Vital signs reviewed and stable and Patient moving all extremities X 4  Post vital signs: Reviewed and stable  Complications: No apparent anesthesia complications

## 2013-10-06 NOTE — Anesthesia Preprocedure Evaluation (Signed)
Anesthesia Evaluation  Patient identified by MRN, date of birth, ID band Patient awake    Reviewed: Allergy & Precautions, H&P , NPO status , Patient's Chart, lab work & pertinent test results  History of Anesthesia Complications Negative for: history of anesthetic complications  Airway Mallampati: I TM Distance: >3 FB Neck ROM: Full    Dental  (+) Teeth Intact, Dental Advisory Given   Pulmonary former smoker,  breath sounds clear to auscultation  Pulmonary exam normal       Cardiovascular Exercise Tolerance: Good hypertension, Rhythm:Regular Rate:Normal     Neuro/Psych PSYCHIATRIC DISORDERS Anxiety    GI/Hepatic negative GI ROS, Neg liver ROS, GERD-  ,  Endo/Other  negative endocrine ROS  Renal/GU negative Renal ROS     Musculoskeletal   Abdominal   Peds  Hematology negative hematology ROS (+)   Anesthesia Other Findings   Reproductive/Obstetrics                           Anesthesia Physical Anesthesia Plan  ASA: III  Anesthesia Plan: General   Post-op Pain Management:    Induction: Intravenous  Airway Management Planned: Oral ETT  Additional Equipment:   Intra-op Plan:   Post-operative Plan: Extubation in OR  Informed Consent: I have reviewed the patients History and Physical, chart, labs and discussed the procedure including the risks, benefits and alternatives for the proposed anesthesia with the patient or authorized representative who has indicated his/her understanding and acceptance.   Dental advisory given  Plan Discussed with: CRNA, Anesthesiologist and Surgeon  Anesthesia Plan Comments:         Anesthesia Quick Evaluation

## 2013-10-06 NOTE — Op Note (Signed)
Brief history: The patient is a 43 year old white male who has complained of back and left leg pain consistent with a lumbar radiculopathy. He has failed medical management and was worked up with a lumbar MRI. This demonstrated a large herniated disc at L3-4 on the left. I discussed the various treatment options with the patient including surgery. He has weighed the risks, benefits, and alternatives surgery and decided proceed with a left L4-3-4 discectomy.  Preoperative diagnosis: Left L3-4 herniated disc, lumbago, lumbar radiculopathy  Postoperative diagnosis: The same  Procedure: Left L3-4 Intervertebral discectomy using micro-dissection  Surgeon: Dr. Earle Gell  Asst.: Dr. Jacolyn Reedy  Anesthesia: Gen. endotracheal  Estimated blood loss: Minimal  Drains: None  Complications: None  Description of procedure: The patient was brought to the operating room by the anesthesia team. General endotracheal anesthesia was induced. The patient was turned to the prone position on the Wilson frame. The patient's lumbosacral region was then prepared with Betadine scrub and Betadine solution. Sterile drapes were applied.  I then injected the area to be incised with Marcaine with epinephrine solution. I then used a scalpel to make a linear midline incision over the  L3-4 intervertebral disc space. I then used electrocautery to perform a left sided subperiosteal dissection exposing the spinous process and lamina of 3 and L4. We obtained intraoperative radiograph to confirm our location. I then inserted the [facility] retractor for exposure.  We then brought the operative microscope into the field. Under its magnification and illumination we completed the microdissection. I used a high-speed drill to perform a laminotomy at L3 on the left. I then used a Kerrison punches to widen the laminotomy and removed the ligamentum flavum at L3-4. We then used microdissection to free up the thecal sac and the left L4  nerve root from the epidural tissue. I then used a Kerrison punch to perform a foraminotomy at about the left L4 nerve root. We then using the nerve root retractor to gently retract the thecal sac and the left L4 nerve root medially. This exposed the intervertebral disc. We identified the ruptured disc and remove it with the pituitary forceps. We inspected the intervertebral disc at L3-4. There were no significant holes in the annulus nor and impending herniations we did not perform a intervertebral discectomy.  I then palpated along the ventral surface of the thecal sac and along exit route of the left L4 nerve root and noted that the neural structures were well decompressed. This completed the decompression.  We then obtained hemostasis using bipolar electrocautery. We irrigated the wound out with bacitracin solution. We then removed the retractor. We then reapproximated the patient's thoracolumbar fascia with interrupted #1 Vicryl suture. We then reapproximated the patient's subcutaneous tissue with interrupted 2-0 Vicryl suture. We then reapproximated patient's skin with Steri-Strips and benzoin. The was then coated with bacitracin ointment. The drapes were removed. The patient was subsequently returned to the supine position where they were extubated by the anesthesia team. The patient was then transported to the postanesthesia care unit in stable condition. All sponge instrument and needle counts were reportedly correct at the end of this case.

## 2013-10-06 NOTE — Anesthesia Procedure Notes (Signed)
Procedure Name: Intubation Date/Time: 10/06/2013 4:30 PM Performed by: Ned Grace Pre-anesthesia Checklist: Patient identified, Timeout performed, Emergency Drugs available, Suction available and Patient being monitored Patient Re-evaluated:Patient Re-evaluated prior to inductionOxygen Delivery Method: Circle system utilized Intubation Type: IV induction Ventilation: Mask ventilation without difficulty and Oral airway inserted - appropriate to patient size Laryngoscope Size: Mac and 4 Grade View: Grade II Tube type: Oral Tube size: 7.5 mm Number of attempts: 1 Airway Equipment and Method: Stylet and Oral airway Placement Confirmation: ETT inserted through vocal cords under direct vision,  breath sounds checked- equal and bilateral and positive ETCO2 Secured at: 22 cm Tube secured with: Tape Dental Injury: Teeth and Oropharynx as per pre-operative assessment

## 2013-10-06 NOTE — H&P (Signed)
Subjective: The patient is a 43 year old white male who has complained of back and left leg pain consistent with a lumbar radiculopathy. He has failed medical management was worked up with a lumbar MRI. This demonstrated a herniated disc at L3-4. I discussed the various treatment options with the patient including surgery. He has weighed the risks, benefits, and alternative surgery and decided proceed with a left L3-4 discectomy.   Past Medical History  Diagnosis Date  . Anxiety   . Hypertension     "Dr Karie Kirks took me off meds"  diet control  . GERD (gastroesophageal reflux disease)     occ    Past Surgical History  Procedure Laterality Date  . Right knee  orif right patella [place] 1993  . Knee surgery    . Left elbow    . Right foot      forgein body removal  . Elbow surgery Left   . Foot surgery    . Carpal tunnel release Right 06/25/2013    Procedure: CARPAL TUNNEL RELEASE;  Surgeon: Carole Civil, MD;  Location: [facility];  Service: Orthopedics;  Laterality: Right;  . Hernia repair Right     inguinal- age 28  . Carpal tunnel release Left 07/25/2013    Procedure: LEFT CARPAL TUNNEL RELEASE;  Surgeon: Carole Civil, MD;  Location: [facility];  Service: Orthopedics;  Laterality: Left;  . Septoplasty      No Known Allergies  History  Substance Use Topics  . Smoking status: Former Smoker -- 1.00 packs/day for 15 years    Types: Cigarettes    Quit date: 12/21/2012  . Smokeless tobacco: Not on file  . Alcohol Use: Yes     Comment: occ  6-8 months last    Family History  Problem Relation Age of Onset  . Heart disease    . Arthritis    . Cancer    . Asthma    . Diabetes    . Kidney disease     Prior to Admission medications   Medication Sig Start Date End Date Taking? Authorizing Provider  clonazePAM (KLONOPIN) 1 MG tablet Take 1 mg by mouth 3 (three) times daily.   Yes Historical Provider, MD  cyclobenzaprine (FLEXERIL) 5 MG tablet Take 1 tablet (5 mg total) by  mouth 3 (three) times daily as needed for muscle spasms. 08/30/13  Yes Almyra Free Idol, PA-C  doxepin (SINEQUAN) 10 MG capsule Take 10 mg by mouth 3 (three) times daily.   Yes Historical Provider, MD  FLUoxetine (PROZAC) 20 MG capsule Take 20 mg by mouth daily.   Yes Historical Provider, MD  HYDROcodone-acetaminophen (NORCO) 10-325 MG per tablet Take 1 tablet by mouth every 6 (six) hours as needed. 09/25/13  Yes Carmin Muskrat, MD  LORazepam (ATIVAN) 1 MG tablet Take 1 mg by mouth at bedtime.   Yes Historical Provider, MD  methylPREDNISolone (MEDROL DOSEPAK) 4 MG tablet Please take as directed 09/25/13  Yes Carmin Muskrat, MD  ibuprofen (ADVIL,MOTRIN) 200 MG tablet Take 200 mg by mouth daily as needed for moderate pain.    Historical Provider, MD     Review of Systems  Positive ROS: As above  All other systems have been reviewed and were otherwise negative with the exception of those mentioned in the HPI and as above.  Objective: Vital signs in last 24 hours: Temp:  [97.2 F (36.2 C)] 97.2 F (36.2 C) (06/29 1445) Pulse Rate:  [51] 51 (06/29 1445) Resp:  [20] 20 (06/29 1445)  BP: (133)/(80) 133/80 mmHg (06/29 1445) SpO2:  [97 %] 97 % (06/29 1445) Weight:  [99.791 kg (220 lb)] 99.791 kg (220 lb) (06/29 1445)  General Appearance: Alert, cooperative, no distress, Head: Normocephalic, without obvious abnormality, atraumatic Eyes: PERRL, conjunctiva/corneas clear, EOM's intact,    Ears: Normal  Throat: Normal  Neck: Supple, symmetrical, trachea midline, no adenopathy; thyroid: No enlargement/tenderness/nodules; no carotid bruit or JVD Back: Symmetric, no curvature, ROM normal, no CVA tenderness Lungs: Clear to auscultation bilaterally, respirations unlabored Heart: Regular rate and rhythm, no murmur, rub or gallop Abdomen: Soft, non-tender,, no masses, no organomegaly Extremities: Extremities normal, atraumatic, no cyanosis or edema Pulses: 2+ and symmetric all extremities Skin: Skin color,  texture, turgor normal, no rashes or lesions  NEUROLOGIC:   Mental status: alert and oriented, no aphasia, good attention span, Fund of knowledge/ memory ok Motor Exam - grossly normal Sensory Exam - grossly normal Reflexes:  Coordination - grossly normal Gait - grossly normal Balance - grossly normal Cranial Nerves: I: smell Not tested  II: visual acuity  OS: Normal  OD: Normal   II: visual fields Full to confrontation  II: pupils Equal, round, reactive to light  III,VII: ptosis None  III,IV,VI: extraocular muscles  Full ROM  V: mastication Normal  V: facial light touch sensation  Normal  V,VII: corneal reflex  Present  VII: facial muscle function - upper  Normal  VII: facial muscle function - lower Normal  VIII: hearing Not tested  IX: soft palate elevation  Normal  IX,X: gag reflex Present  XI: trapezius strength  5/5  XI: sternocleidomastoid strength 5/5  XI: neck flexion strength  5/5  XII: tongue strength  Normal    Data Review Lab Results  Component Value Date   WBC 8.7 10/03/2013   HGB 15.5 10/03/2013   HCT 44.1 10/03/2013   MCV 90.6 10/03/2013   PLT 247 10/03/2013   Lab Results  Component Value Date   NA 141 06/23/2013   K 4.3 06/23/2013   CL 104 06/23/2013   CO2 26 06/23/2013   BUN 25* 06/23/2013   CREATININE 1.21 06/23/2013   GLUCOSE 107* 06/23/2013   Lab Results  Component Value Date   INR 0.98 06/13/2010    Assessment/Plan: Left L3-4 herniated disc, lumbago, lumbar radiculopathy: I discussed the situation with the patient. I have reviewed his MR scan with them and pointed out the abnormalities. We have discussed the various treatment options including surgery. I described the surgical treatment option of a left L3-4 discectomy. I've shown him surgical models. We have discussed the risks, benefits, alternatives, and likelihood of achieving our goals with surgery. I have answered all the patient's questions. He has decided to proceed with  surgery.   JENKINS,JEFFREY D 10/06/2013 4:05 PM

## 2013-10-07 ENCOUNTER — Encounter (HOSPITAL_COMMUNITY): Payer: Self-pay | Admitting: Neurosurgery

## 2013-10-07 MED ORDER — OXYCODONE-ACETAMINOPHEN 10-325 MG PO TABS: 1.0000 | ORAL_TABLET | ORAL | Status: DC | PRN

## 2013-10-07 MED ORDER — TAMSULOSIN HCL 0.4 MG PO CAPS
0.8000 mg | ORAL_CAPSULE | Freq: Every day | ORAL | Status: DC
  Administered 2013-10-07: 0.8 mg via ORAL
  Filled 2013-10-07 (×2): qty 2

## 2013-10-07 MED ORDER — DSS 100 MG PO CAPS: 100.0000 mg | ORAL_CAPSULE | Freq: Two times a day (BID) | ORAL | Status: DC

## 2013-10-07 MED ORDER — TAMSULOSIN HCL 0.4 MG PO CAPS: 0.8000 mg | ORAL_CAPSULE | Freq: Every day | ORAL | Status: DC

## 2013-10-07 MED ORDER — DIAZEPAM 5 MG PO TABS: 5.0000 mg | ORAL_TABLET | Freq: Four times a day (QID) | ORAL | Status: DC | PRN

## 2013-10-07 NOTE — Progress Notes (Signed)
UR Completed.  [name], Jamie Jane [phone] 10/07/2013  

## 2013-10-07 NOTE — Evaluation (Addendum)
Physical Therapy Evaluation Patient Details Name: Jamie [name] MRN: [id number] DOB: [birth year] Today's Date: 10/07/2013   History of Present Illness  Pt admitted for L3-4 microdiscectomy  Clinical Impression  Pt pleasant and moving well post op. Pt reports tingling on anterior aspect of left knee, pain 4/10 at rest- incisional but no other deficits of pain or weakness. Pt will benefit from acute therapy to address below deficits and maximize independence adhering to precautions prior to D/C.     Follow Up Recommendations No PT follow up    Equipment Recommendations  None recommended by PT    Recommendations for Other Services       Precautions / Restrictions Precautions Precautions: Back Precaution Booklet Issued: Yes (comment) Restrictions Weight Bearing Restrictions: No      Mobility  Bed Mobility Overal bed mobility: Needs Assistance Bed Mobility: Rolling;Sidelying to Sit;Sit to Sidelying Rolling: Min guard Sidelying to sit: Min guard     Sit to sidelying: Min guard General bed mobility comments: cues for sequence with assist to prevent twisting and arching  Transfers Overall transfer level: Needs assistance   Transfers: Sit to/from Stand Sit to Stand: Supervision         General transfer comment: cues for hand placement and posture  Ambulation/Gait Ambulation/Gait assistance: Modified independent (Device/Increase time) Ambulation Distance (Feet): 400 Feet Assistive device: None Gait Pattern/deviations: Step-through pattern;Decreased stride length   Gait velocity interpretation: Below normal speed for age/gender    Stairs Stairs: Yes Stairs assistance: Modified independent (Device/Increase time) Stair Management: No rails;Step to pattern Number of Stairs: 2    Wheelchair Mobility    Modified Rankin (Stroke Patients Only)       Balance                                             Pertinent Vitals/Pain 4/10 incisional  pain, repositioned and premedicated    Home Living Family/patient expects to be discharged to:: Private residence Living Arrangements: Alone Available Help at Discharge: Available 24 hours/day Type of Home: House Home Access: Stairs to enter   Technical brewer of Steps: 1 Home Layout: One level Home Equipment: None      Prior Function Level of Independence: Independent               Hand Dominance        Extremity/Trunk Assessment   Upper Extremity Assessment: Overall WFL for tasks assessed           Lower Extremity Assessment: Overall WFL for tasks assessed      Cervical / Trunk Assessment: Normal  Communication   Communication: No difficulties  Cognition Arousal/Alertness: Awake/alert Behavior During Therapy: WFL for tasks assessed/performed Overall Cognitive Status: Within Functional Limits for tasks assessed                      General Comments      Exercises        Assessment/Plan    PT Assessment Patient needs continued PT services  PT Diagnosis Difficulty walking   PT Problem List Decreased activity tolerance;Decreased knowledge of precautions;Decreased mobility;Pain  PT Treatment Interventions Gait training;DME instruction;Functional mobility training;Therapeutic activities;Patient/family education   PT Goals (Current goals can be found in the Care Plan section) Acute Rehab PT Goals Patient Stated Goal: be able to go fishing PT Goal Formulation: With patient Time For Goal Achievement:  10/14/13 Potential to Achieve Goals: Good    Frequency Min 5X/week   Barriers to discharge Decreased caregiver support pt plans to D/C with mom, normally lives alone    Co-evaluation               End of Session   Activity Tolerance: Patient tolerated treatment well Patient left: in chair;with call bell/phone within reach Nurse Communication: Mobility status;Precautions    Functional Assessment Tool Used: clinical  judgement Functional Limitation: Mobility: Walking and moving around Mobility: Walking and Moving Around Current Status (Q3300): At least 1 percent but less than 20 percent impaired, limited or restricted Mobility: Walking and Moving Around Goal Status [phone]): 0 percent impaired, limited or restricted    Time: 0817-0834 PT Time Calculation (min): 17 min   Charges:   PT Evaluation $Initial PT Evaluation Tier I: 1 Procedure PT Treatments $Therapeutic Activity: 8-22 mins   PT G Codes:   Functional Assessment Tool Used: clinical judgement Functional Limitation: Mobility: Walking and moving around    Melford Aase 10/07/2013, 9:19 AM Elwyn Reach, [place]

## 2013-10-07 NOTE — Progress Notes (Signed)
Pt voiding with BS < 300. Pt given D/C instructions with Rx's, verbal understanding of teaching was given. Pt's IV was removed prior to D/C. Pt D/C'd home via wheelchair @ 1635 per MD order. Pt is stable @ D/C and has no other needs. Holli Humbles, RN

## 2013-10-07 NOTE — Discharge Summary (Signed)
  Physician Discharge Summary  Patient ID: Jamie [name] MRN: [id number] DOB/AGE: [birth year] 43 y.o.  Admit date: 10/06/2013 Discharge date: 10/07/2013  Admission Diagnoses: Left L3-4 herniated disc, lumbago, lumbar radiculopathy, urinary retention  Discharge Diagnoses: The same Active Problems:   Lumbar herniated disc   Discharged Condition: good  Hospital Course: I performed a left L3-4 discectomy on the patient on 10/06/2013. The surgery went well.  The patient's postoperative course was remarkable for urinary retention. A Foley catheter was placed. The patient was started on Flomax. The plan is to remove the catheter later this afternoon. If he can urinate he will be discharged as requested. The patient was given oral and written discharge instructions. All his questions were answered.  Consults: None Significant Diagnostic Studies: None Treatments: Left L3-4 discectomy using microdissection Discharge Exam: Blood pressure 130/79, pulse 53, temperature 98.4 F (36.9 C), temperature source Oral, resp. rate 18, height 5\' 11"  (1.803 m), weight 99.791 kg (220 lb), SpO2 98.00%. The patient is alert and pleasant. He looks well. His strength is normal his lower extremities.  Disposition: Home if he can urinate.     Medication List    STOP taking these medications       cyclobenzaprine 5 MG tablet  Commonly known as:  FLEXERIL     HYDROcodone-acetaminophen 10-325 MG per tablet  Commonly known as:  NORCO     LORazepam 1 MG tablet  Commonly known as:  ATIVAN     methylPREDNISolone 4 MG tablet  Commonly known as:  MEDROL DOSEPAK      TAKE these medications       clonazePAM 1 MG tablet  Commonly known as:  KLONOPIN  Take 1 mg by mouth 3 (three) times daily.     diazepam 5 MG tablet  Commonly known as:  VALIUM  Take 1 tablet (5 mg total) by mouth every 6 (six) hours as needed for muscle spasms.     doxepin 10 MG capsule  Commonly known as:  SINEQUAN  Take 10 mg by  mouth 3 (three) times daily.     DSS 100 MG Caps  Take 100 mg by mouth 2 (two) times daily.     FLUoxetine 20 MG capsule  Commonly known as:  PROZAC  Take 20 mg by mouth daily.     ibuprofen 200 MG tablet  Commonly known as:  ADVIL,MOTRIN  Take 200 mg by mouth daily as needed for moderate pain.     oxyCODONE-acetaminophen 10-325 MG per tablet  Commonly known as:  PERCOCET  Take 1 tablet by mouth every 4 (four) hours as needed for pain.     tamsulosin 0.4 MG Caps capsule  Commonly known as:  FLOMAX  Take 2 capsules (0.8 mg total) by mouth daily after breakfast.         Signed: JENKINS,JEFFREY D 10/07/2013, 7:41 AM

## 2013-10-07 NOTE — Anesthesia Postprocedure Evaluation (Signed)
  Anesthesia Post-op Note  Patient: Jamie [name]  Procedure(s) Performed: Procedure(s) with comments: Left Lumbar Three-four microdiskectomy (Left) - Left Lumbar Three-four microdiskectomy  Patient Location: PACU  Anesthesia Type:General  Level of Consciousness: awake  Airway and Oxygen Therapy: Patient Spontanous Breathing  Post-op Pain: mild  Post-op Assessment: Post-op Vital signs reviewed  Post-op Vital Signs: Reviewed  Last Vitals:  Filed Vitals:   10/07/13 1233  BP: 126/78  Pulse: 68  Temp: 37.2 C  Resp: 16    Complications: No apparent anesthesia complications

## 2013-10-13 ENCOUNTER — Emergency Department (HOSPITAL_COMMUNITY)
Admission: EM | Admit: 2013-10-13 | Discharge: 2013-10-14 | Disposition: A | Discharge status: Home / Self Care | Payer: Medicaid Other | Attending: Emergency Medicine | Admitting: Emergency Medicine

## 2013-10-13 ENCOUNTER — Encounter (HOSPITAL_COMMUNITY): Payer: Self-pay | Admitting: Emergency Medicine

## 2013-10-13 DIAGNOSIS — I10 Essential (primary) hypertension: Secondary | ICD-10-CM | POA: Insufficient documentation

## 2013-10-13 DIAGNOSIS — Z9889 Other specified postprocedural states: Secondary | ICD-10-CM | POA: Insufficient documentation

## 2013-10-13 DIAGNOSIS — Y929 Unspecified place or not applicable: Secondary | ICD-10-CM | POA: Diagnosis not present

## 2013-10-13 DIAGNOSIS — IMO0002 Reserved for concepts with insufficient information to code with codable children: Secondary | ICD-10-CM | POA: Diagnosis present

## 2013-10-13 DIAGNOSIS — S335XXA Sprain of ligaments of lumbar spine, initial encounter: Secondary | ICD-10-CM | POA: Insufficient documentation

## 2013-10-13 DIAGNOSIS — Y9389 Activity, other specified: Secondary | ICD-10-CM | POA: Diagnosis not present

## 2013-10-13 DIAGNOSIS — R296 Repeated falls: Secondary | ICD-10-CM | POA: Diagnosis not present

## 2013-10-13 DIAGNOSIS — Z87891 Personal history of nicotine dependence: Secondary | ICD-10-CM | POA: Diagnosis not present

## 2013-10-13 DIAGNOSIS — Z79899 Other long term (current) drug therapy: Secondary | ICD-10-CM | POA: Insufficient documentation

## 2013-10-13 DIAGNOSIS — Z8719 Personal history of other diseases of the digestive system: Secondary | ICD-10-CM | POA: Diagnosis not present

## 2013-10-13 DIAGNOSIS — F411 Generalized anxiety disorder: Secondary | ICD-10-CM | POA: Insufficient documentation

## 2013-10-13 DIAGNOSIS — S301XXA Contusion of abdominal wall, initial encounter: Secondary | ICD-10-CM | POA: Diagnosis not present

## 2013-10-13 DIAGNOSIS — S39012A Strain of muscle, fascia and tendon of lower back, initial encounter: Secondary | ICD-10-CM

## 2013-10-13 MED ORDER — KETOROLAC TROMETHAMINE 60 MG/2ML IM SOLN
60.0000 mg | Freq: Once | INTRAMUSCULAR | Status: AC
  Administered 2013-10-13: 60 mg via INTRAMUSCULAR
  Filled 2013-10-13: qty 2

## 2013-10-13 MED ORDER — MAGNESIUM HYDROXIDE 400 MG/5ML PO SUSP
30.0000 mL | Freq: Once | ORAL | Status: AC
  Administered 2013-10-13: 30 mL via ORAL
  Filled 2013-10-13: qty 30

## 2013-10-13 MED ORDER — PROMETHAZINE HCL 12.5 MG PO TABS
12.5000 mg | ORAL_TABLET | Freq: Once | ORAL | Status: AC
  Administered 2013-10-13: 12.5 mg via ORAL
  Filled 2013-10-13: qty 1

## 2013-10-13 MED ORDER — HYDROMORPHONE HCL PF 2 MG/ML IJ SOLN
2.0000 mg | Freq: Once | INTRAMUSCULAR | Status: AC
  Administered 2013-10-13: 2 mg via INTRAMUSCULAR
  Filled 2013-10-13: qty 1

## 2013-10-13 NOTE — ED Notes (Signed)
Fall 8 am when getting out of shower.  Pain in low back, abd,  Ltleg numb.  Surgery  6/29 - Dr Arnoldo Morale.  For ruptured disc.

## 2013-10-13 NOTE — ED Provider Notes (Signed)
CSN: [id number]     Arrival date & time 10/13/13  2024 History   First MD Initiated Contact with Patient 10/13/13 2235     Chief Complaint  Patient presents with  . Fall     (Consider location/radiation/quality/duration/timing/severity/associated sxs/prior Treatment) HPI Comments: Pt is a 43 y/o male who presents to the ED with c/o abd pain and back pain after a fall getting out of the shower about 4 PM  today. The patient states he lost his footing and fell forward, arching his back and pulling his back. He states that he applied some ice to his back for about 15 minutes, but continued to notice pain and a burning sensation of his lower back. He complains of a soreness of his lower abdomen. The patient states that he has not had a bowel movement for at least the last 2 days. He's been taking stool softeners but this has not been successful up to this point. The patient denies being on any anticoagulation medications. During the fall he did not lose control of bowel or bladder. He has pain with getting around since the surgery, states the pain is more intense now.  Patient is a 43 y.o. male presenting with fall. The history is provided by the patient.  Fall Associated symptoms include arthralgias. Pertinent negatives include no abdominal pain, chest pain, coughing or neck pain.    Past Medical History  Diagnosis Date  . Anxiety   . Hypertension     "Dr Karie Kirks took me off meds"  diet control  . GERD (gastroesophageal reflux disease)     occ   Past Surgical History  Procedure Laterality Date  . Right knee  orif right patella Keeling 1993  . Knee surgery    . Left elbow    . Right foot      forgein body removal  . Elbow surgery Left   . Foot surgery    . Carpal tunnel release Right 06/25/2013    Procedure: CARPAL TUNNEL RELEASE;  Surgeon: Carole Civil, MD;  Location: [facility];  Service: Orthopedics;  Laterality: Right;  . Hernia repair Right     inguinal- age 67  . Carpal tunnel  release Left 07/25/2013    Procedure: LEFT CARPAL TUNNEL RELEASE;  Surgeon: Carole Civil, MD;  Location: [facility];  Service: Orthopedics;  Laterality: Left;  . Septoplasty    . Lumbar laminectomy/decompression microdiscectomy Left 10/06/2013    Procedure: Left Lumbar Three-four microdiskectomy;  Surgeon: Ophelia Charter, MD;  Location: [place] [facility];  Service: Neurosurgery;  Laterality: Left;  Left Lumbar Three-four microdiskectomy  . Back surgery     Family History  Problem Relation Age of Onset  . Heart disease    . Arthritis    . Cancer    . Asthma    . Diabetes    . Kidney disease     History  Substance Use Topics  . Smoking status: Former Smoker -- 1.00 packs/day for 15 years    Types: Cigarettes    Quit date: 12/21/2012  . Smokeless tobacco: Not on file  . Alcohol Use: Yes     Comment: occ  6-8 months last    Review of Systems  Constitutional: Negative for activity change.       All ROS Neg except as noted in HPI  HENT: Negative for nosebleeds.   Eyes: Negative for photophobia and discharge.  Respiratory: Negative for cough, shortness of breath and wheezing.   Cardiovascular: Negative for  chest pain and palpitations.  Gastrointestinal: Negative for abdominal pain and blood in stool.  Genitourinary: Negative for dysuria, frequency and hematuria.  Musculoskeletal: Positive for arthralgias and back pain. Negative for neck pain.  Skin: Negative.   Neurological: Negative for dizziness, seizures and speech difficulty.  Psychiatric/Behavioral: Negative for hallucinations and confusion. The patient is nervous/anxious.       Allergies  Review of patient's allergies indicates no known allergies.  Home Medications   Prior to Admission medications   Medication Sig Start Date End Date Taking? Authorizing Provider  clonazePAM (KLONOPIN) 1 MG tablet Take 1 mg by mouth 3 (three) times daily.   Yes Historical Provider, MD  diazepam (VALIUM) 5 MG tablet Take 1 tablet (5 mg  total) by mouth every 6 (six) hours as needed for muscle spasms. 10/07/13  Yes Ophelia Charter, MD  docusate sodium 100 MG CAPS Take 100 mg by mouth 2 (two) times daily. 10/07/13  Yes Ophelia Charter, MD  doxepin (SINEQUAN) 10 MG capsule Take 10-30 mg by mouth at bedtime as needed (for sleep).    Yes Historical Provider, MD  FLUoxetine (PROZAC) 20 MG capsule Take 20 mg by mouth daily.   Yes Historical Provider, MD  methylPREDNISolone (MEDROL DOSEPAK) 4 MG tablet Take by mouth as directed. follow package directions   Yes Historical Provider, MD  oxyCODONE-acetaminophen (PERCOCET) 10-325 MG per tablet Take 1 tablet by mouth every 4 (four) hours as needed for pain. 10/07/13  Yes Ophelia Charter, MD  tamsulosin [facility]) 0.4 MG CAPS capsule Take 2 capsules (0.8 mg total) by mouth daily after breakfast. 10/07/13  Yes Ophelia Charter, MD  zolpidem (AMBIEN) 5 MG tablet Take 5 mg by mouth at bedtime as needed for sleep.   Yes Historical Provider, MD   BP 103/72  Pulse 61  Temp(Src) 98.6 F (37 C) (Oral)  Resp 18  Ht 5\' 11"  (1.803 m)  Wt 220 lb (99.791 kg)  BMI 30.70 kg/m2  SpO2 100% Physical Exam  Nursing note and vitals reviewed. Constitutional: He is oriented to person, place, and time. He appears well-developed and well-nourished.  Non-toxic appearance.  HENT:  Head: Normocephalic.  Right Ear: Tympanic membrane and external ear normal.  Left Ear: Tympanic membrane and external ear normal.  Eyes: EOM and lids are normal. Pupils are equal, round, and reactive to light.  Neck: Normal range of motion. Neck supple. Carotid bruit is not present.  Cardiovascular: Normal rate, regular rhythm, normal heart sounds, intact distal pulses and normal pulses.   Pulmonary/Chest: Breath sounds normal. No respiratory distress.  Abdominal: Soft. Bowel sounds are normal. He exhibits no mass. There is no guarding.  Bowel sounds are quite gaseous. Abdomen is soft. There is mild soreness of the pannus at the  lower abdomen, no bruising appreciated. There is no mass appreciated.  Musculoskeletal:  Patient is a healing surgical scar at the mid to lower lumbar area. There is no hematoma palpated around the surgical site. There is noted some spasm to palpation in the paraspinal region.  Lymphadenopathy:       Head (right side): No submandibular adenopathy present.       Head (left side): No submandibular adenopathy present.    He has no cervical adenopathy.  Neurological: He is alert and oriented to person, place, and time. He has normal strength. No sensory deficit. He exhibits normal muscle tone. Coordination normal.  Patient complains of a tingling sensation when I test his left calf area for soft  touch sensation. He can easily feel pain. No other changes in his neurologic examination. His gait is slow but steady. There is no foot drop appreciated.  Skin: Skin is warm and dry.  Psychiatric: His speech is normal. His mood appears anxious.    ED Course  Procedures (including critical care time) Labs Review Labs Reviewed - No data to display  Imaging Review No results found.   EKG Interpretation None      MDM Patient was treated in the emergency department with intramuscular Dilaudid and intramuscular Toradol. Patient seems to be having decreased pain after these injections. He remains ambulatory in the room and [place].  The patient is to advised to see his primary physician or specialist this week for additional evaluation of his back. The patient and his wife had questions about imaging and pain medication. These questions were answered. The patient acknowledges the discharge plan.    Final diagnoses:  None    *I have reviewed nursing notes, vital signs, and all appropriate lab and imaging results for this patient.Lenox Ahr, PA-C 10/14/13 [phone]

## 2013-10-13 NOTE — ED Notes (Signed)
Patient states he fell getting out of the shower this morning landing on his chest. Patient states pain is in his lower abdomin, left leg and lower back. Patient recently had back surgery, pt c/o pain to surgical site. Pt is alert and oriented X4. Family at bedside. No needs voiced.

## 2013-10-14 NOTE — Discharge Instructions (Signed)
Please use your ice pack tonight and tomorrow. Please rest your back is much as possible. You may need to use a fleets mineral oil enema to help with your constipation. Please see Dr. Karie Kirks, or your neurosurgery specialist if you're pain and spasm continues. He Back Pain, Adult Low back pain is very common. About 1 in 5 people have back pain.The cause of low back pain is rarely dangerous. The pain often gets better over time.About half of people with a sudden onset of back pain feel better in just 2 weeks. About 8 in 10 people feel better by 6 weeks.  CAUSES Some common causes of back pain include:  Strain of the muscles or ligaments supporting the spine.  Wear and tear (degeneration) of the spinal discs.  Arthritis.  Direct injury to the back. DIAGNOSIS Most of the time, the direct cause of low back pain is not known.However, back pain can be treated effectively even when the exact cause of the pain is unknown.Answering your caregiver's questions about your overall health and symptoms is one of the most accurate ways to make sure the cause of your pain is not dangerous. If your caregiver needs more information, he or she may order lab work or imaging tests (X-rays or MRIs).However, even if imaging tests show changes in your back, this usually does not require surgery. HOME CARE INSTRUCTIONS For many people, back pain returns.Since low back pain is rarely dangerous, it is often a condition that people can learn to [facility] their own.   Remain active. It is stressful on the back to sit or stand in one place. Do not sit, drive, or stand in one place for more than 30 minutes at a time. Take short walks on level surfaces as soon as pain allows.Try to increase the length of time you walk each day.  Do not stay in bed.Resting more than 1 or 2 days can delay your recovery.  Do not avoid exercise or work.Your body is made to move.It is not dangerous to be active, even though your back may  hurt.Your back will likely heal faster if you return to being active before your pain is gone.  Pay attention to your body when you bend and lift. Many people have less discomfortwhen lifting if they bend their knees, keep the load close to their bodies,and avoid twisting. Often, the most comfortable positions are those that put less stress on your recovering back.  Find a comfortable position to sleep. Use a firm mattress and lie on your side with your knees slightly bent. If you lie on your back, put a pillow under your knees.  Only take over-the-counter or prescription medicines as directed by your caregiver. Over-the-counter medicines to reduce pain and inflammation are often the most helpful.Your caregiver may prescribe muscle relaxant drugs.These medicines help dull your pain so you can more quickly return to your normal activities and healthy exercise.  Put ice on the injured area.  Put ice in a plastic bag.  Place a towel between your skin and the bag.  Leave the ice on for 15-20 minutes, 03-04 times a day for the first 2 to 3 days. After that, ice and heat may be alternated to reduce pain and spasms.  Ask your caregiver about trying back exercises and gentle massage. This may be of some benefit.  Avoid feeling anxious or stressed.Stress increases muscle tension and can worsen back pain.It is important to recognize when you are anxious or stressed and learn ways to  manage it.Exercise is a great option. SEEK MEDICAL CARE IF:  You have pain that is not relieved with rest or medicine.  You have pain that does not improve in 1 week.  You have new symptoms.  You are generally not feeling well. SEEK IMMEDIATE MEDICAL CARE IF:   You have pain that radiates from your back into your legs.  You develop new bowel or bladder control problems.  You have unusual weakness or numbness in your arms or legs.  You develop nausea or vomiting.  You develop abdominal pain.  You  feel faint. Document Released: 03/27/2005 Document Revised: 09/26/2011 Document Reviewed: 08/15/2010 [facility] Patient Information 2015 [place], [place]. This information is not intended to replace advice given to you by your health care provider. Make sure you discuss any questions you have with your health care provider.

## 2013-10-14 NOTE — ED Notes (Signed)
Patient verbalizes discharge instructions and voices plan to follow up with PCP tomorrow for pain management. Patient ambulatory out of department at this time with family member

## 2013-10-16 NOTE — ED Provider Notes (Signed)
Medical screening examination/treatment/procedure(s) were performed by non-physician practitioner and as supervising physician I was immediately available for consultation/collaboration.   EKG Interpretation None        Maudry Diego, MD 10/16/13 1352

## 2014-07-30 ENCOUNTER — Encounter (HOSPITAL_COMMUNITY): Payer: Self-pay | Admitting: Cardiology

## 2014-07-30 ENCOUNTER — Emergency Department (HOSPITAL_COMMUNITY)
Admission: EM | Admit: 2014-07-30 | Discharge: 2014-07-30 | Disposition: A | Discharge status: Home / Self Care | Payer: Medicaid Other | Attending: Emergency Medicine | Admitting: Emergency Medicine

## 2014-07-30 ENCOUNTER — Emergency Department (HOSPITAL_COMMUNITY): Payer: Medicaid Other

## 2014-07-30 DIAGNOSIS — Z87891 Personal history of nicotine dependence: Secondary | ICD-10-CM | POA: Insufficient documentation

## 2014-07-30 DIAGNOSIS — F419 Anxiety disorder, unspecified: Secondary | ICD-10-CM | POA: Insufficient documentation

## 2014-07-30 DIAGNOSIS — R110 Nausea: Secondary | ICD-10-CM | POA: Diagnosis not present

## 2014-07-30 DIAGNOSIS — R0602 Shortness of breath: Secondary | ICD-10-CM | POA: Insufficient documentation

## 2014-07-30 DIAGNOSIS — R05 Cough: Secondary | ICD-10-CM | POA: Insufficient documentation

## 2014-07-30 DIAGNOSIS — I10 Essential (primary) hypertension: Secondary | ICD-10-CM | POA: Insufficient documentation

## 2014-07-30 DIAGNOSIS — R197 Diarrhea, unspecified: Secondary | ICD-10-CM | POA: Insufficient documentation

## 2014-07-30 DIAGNOSIS — R509 Fever, unspecified: Secondary | ICD-10-CM | POA: Diagnosis not present

## 2014-07-30 DIAGNOSIS — Z8719 Personal history of other diseases of the digestive system: Secondary | ICD-10-CM | POA: Insufficient documentation

## 2014-07-30 DIAGNOSIS — R0981 Nasal congestion: Secondary | ICD-10-CM | POA: Insufficient documentation

## 2014-07-30 DIAGNOSIS — M791 Myalgia: Secondary | ICD-10-CM | POA: Insufficient documentation

## 2014-07-30 DIAGNOSIS — R6889 Other general symptoms and signs: Secondary | ICD-10-CM

## 2014-07-30 DIAGNOSIS — Z79899 Other long term (current) drug therapy: Secondary | ICD-10-CM | POA: Diagnosis not present

## 2014-07-30 DIAGNOSIS — J029 Acute pharyngitis, unspecified: Secondary | ICD-10-CM | POA: Insufficient documentation

## 2014-07-30 MED ORDER — ONDANSETRON 4 MG PO TBDP: 4.0000 mg | ORAL_TABLET | Freq: Three times a day (TID) | ORAL | Status: DC | PRN

## 2014-07-30 MED ORDER — DM-GUAIFENESIN ER 30-600 MG PO TB12: 1.0000 | ORAL_TABLET | Freq: Two times a day (BID) | ORAL | Status: DC

## 2014-07-30 MED ORDER — HYDROCODONE-ACETAMINOPHEN 5-325 MG PO TABS: 1.0000 | ORAL_TABLET | Freq: Four times a day (QID) | ORAL | Status: DC | PRN

## 2014-07-30 MED ORDER — ONDANSETRON 4 MG PO TBDP
4.0000 mg | ORAL_TABLET | Freq: Once | ORAL | Status: AC
  Administered 2014-07-30: 4 mg via ORAL
  Filled 2014-07-30: qty 1

## 2014-07-30 NOTE — Discharge Instructions (Signed)
Take medications as directed. Symptoms seem to be flulike in nature. Return for any new or worse symptoms. Chest x-ray negative for pneumonia.

## 2014-07-30 NOTE — ED Notes (Signed)
Cough times 2 days.

## 2014-07-30 NOTE — ED Provider Notes (Signed)
CSN: [id number]     Arrival date & time 07/30/14  0706 History   First MD Initiated Contact with Patient 07/30/14 0725     Chief Complaint  Patient presents with  . Cough     (Consider location/radiation/quality/duration/timing/severity/associated sxs/prior Treatment) Patient is a 44 y.o. male presenting with cough. The history is provided by the patient.  Cough Associated symptoms: chills, fever, myalgias and shortness of breath   Associated symptoms: no chest pain, no headaches and no rash    patient with onset of flulike symptoms on Monday. Bodyaches cough productive cough and mild sore throat nausea no vomiting and some diarrhea. Patient's mother did have the flu in the month of March. Patient did not have the flu shot. Patient denies any significant chest pain or abdominal pain. No significant headache.  Past Medical History  Diagnosis Date  . Anxiety   . Hypertension     "Dr Karie Kirks took me off meds"  diet control  . GERD (gastroesophageal reflux disease)     occ   Past Surgical History  Procedure Laterality Date  . Right knee  orif right patella Keeling 1993  . Knee surgery    . Left elbow    . Right foot      forgein body removal  . Elbow surgery Left   . Foot surgery    . Carpal tunnel release Right 06/25/2013    Procedure: CARPAL TUNNEL RELEASE;  Surgeon: Carole Civil, MD;  Location: [facility];  Service: Orthopedics;  Laterality: Right;  . Hernia repair Right     inguinal- age 60  . Carpal tunnel release Left 07/25/2013    Procedure: LEFT CARPAL TUNNEL RELEASE;  Surgeon: Carole Civil, MD;  Location: [facility];  Service: Orthopedics;  Laterality: Left;  . Septoplasty    . Lumbar laminectomy/decompression microdiscectomy Left 10/06/2013    Procedure: Left Lumbar Three-four microdiskectomy;  Surgeon: Ophelia Charter, MD;  Location: [place] [facility];  Service: Neurosurgery;  Laterality: Left;  Left Lumbar Three-four microdiskectomy  . Back surgery     Family History   Problem Relation Age of Onset  . Heart disease    . Arthritis    . Cancer    . Asthma    . Diabetes    . Kidney disease     History  Substance Use Topics  . Smoking status: Former Smoker -- 1.00 packs/day for 15 years    Types: Cigarettes    Quit date: 12/21/2012  . Smokeless tobacco: Not on file  . Alcohol Use: Yes     Comment: occ  6-8 months last    Review of Systems  Constitutional: Positive for fever and chills.  HENT: Positive for congestion.   Eyes: Negative for visual disturbance.  Respiratory: Positive for cough and shortness of breath.   Cardiovascular: Negative for chest pain.  Gastrointestinal: Positive for nausea and diarrhea. Negative for vomiting and abdominal pain.  Genitourinary: Negative for dysuria.  Musculoskeletal: Positive for myalgias.  Skin: Negative for rash.  Neurological: Negative for headaches.  Hematological: Does not bruise/bleed easily.  Psychiatric/Behavioral: Negative for confusion.      Allergies  Review of patient's allergies indicates no known allergies.  Home Medications   Prior to Admission medications   Medication Sig Start Date End Date Taking? Authorizing Provider  clonazePAM (KLONOPIN) 1 MG tablet Take 1 mg by mouth 3 (three) times daily.    Historical Provider, MD  dextromethorphan-guaiFENesin (MUCINEX DM) 30-600 MG per 12 hr tablet Take  1 tablet by mouth 2 (two) times daily. 07/30/14   Fredia Sorrow, MD  diazepam (VALIUM) 5 MG tablet Take 1 tablet (5 mg total) by mouth every 6 (six) hours as needed for muscle spasms. 10/07/13   Newman Pies, MD  docusate sodium 100 MG CAPS Take 100 mg by mouth 2 (two) times daily. 10/07/13   Newman Pies, MD  doxepin (SINEQUAN) 10 MG capsule Take 10-30 mg by mouth at bedtime as needed (for sleep).     Historical Provider, MD  FLUoxetine (PROZAC) 20 MG capsule Take 20 mg by mouth daily.    Historical Provider, MD  HYDROcodone-acetaminophen (NORCO/VICODIN) 5-325 MG per tablet Take 1-2  tablets by mouth every 6 (six) hours as needed. 07/30/14   Fredia Sorrow, MD  methylPREDNISolone (MEDROL DOSEPAK) 4 MG tablet Take by mouth as directed. follow package directions    Historical Provider, MD  ondansetron (ZOFRAN ODT) 4 MG disintegrating tablet Take 1 tablet (4 mg total) by mouth every 8 (eight) hours as needed. 07/30/14   Fredia Sorrow, MD  oxyCODONE-acetaminophen (PERCOCET) 10-325 MG per tablet Take 1 tablet by mouth every 4 (four) hours as needed for pain. 10/07/13   Newman Pies, MD  tamsulosin (FLOMAX) 0.4 MG CAPS capsule Take 2 capsules (0.8 mg total) by mouth daily after breakfast. 10/07/13   Newman Pies, MD  zolpidem (AMBIEN) 5 MG tablet Take 5 mg by mouth at bedtime as needed for sleep.    Historical Provider, MD   BP 129/87 mmHg  Pulse 64  Temp(Src) 98.3 F (36.8 C) (Oral)  Resp 18  Ht 5\' 10"  (1.778 m)  Wt 216 lb (97.977 kg)  BMI 30.99 kg/m2  SpO2 100% Physical Exam  Constitutional: He is oriented to person, place, and time. He appears well-developed and well-nourished. No distress.  HENT:  Head: Normocephalic and atraumatic.  Mouth/Throat: Oropharynx is clear and moist.  Eyes: Conjunctivae and EOM are normal. Pupils are equal, round, and reactive to light.  Neck: Normal range of motion.  Cardiovascular: Normal rate, regular rhythm and normal heart sounds.   Pulmonary/Chest: Effort normal and breath sounds normal. No respiratory distress.  Abdominal: Soft. Bowel sounds are normal. There is no tenderness.  Musculoskeletal: Normal range of motion.  Neurological: He is alert and oriented to person, place, and time. No cranial nerve deficit. He exhibits normal muscle tone. Coordination normal.  Skin: Skin is warm. No rash noted.  Nursing note and vitals reviewed.   ED Course  Procedures (including critical care time) Labs Review Labs Reviewed - No data to display  Imaging Review Dg Chest 2 View  07/30/2014   CLINICAL DATA:  Productive cough for 2  days. Mid chest pain. Ex smoker. Initial encounter.  EXAM: CHEST  2 VIEW  COMPARISON:  10/03/2013.  FINDINGS: The heart size and mediastinal contours are within normal limits. Both lungs are clear. The visualized skeletal structures are unremarkable.  IMPRESSION: No active cardiopulmonary disease.   Electronically Signed   By: Rolla Flatten M.D.   On: 07/30/2014 07:40     EKG Interpretation None      MDM   Final diagnoses:  Flu-like symptoms    The patient's symptoms of flulike in nature. There are no evidence of pneumonia. No hypoxia nontoxic. Will treat symptomatically.    Fredia Sorrow, MD 07/30/14 [phone]

## 2014-09-24 ENCOUNTER — Encounter (HOSPITAL_COMMUNITY): Payer: Self-pay | Admitting: *Deleted

## 2014-09-24 ENCOUNTER — Emergency Department (HOSPITAL_COMMUNITY)
Admission: EM | Admit: 2014-09-24 | Discharge: 2014-09-24 | Disposition: A | Discharge status: Home / Self Care | Payer: Medicaid Other | Attending: Emergency Medicine | Admitting: Emergency Medicine

## 2014-09-24 DIAGNOSIS — S3992XA Unspecified injury of lower back, initial encounter: Secondary | ICD-10-CM | POA: Diagnosis not present

## 2014-09-24 DIAGNOSIS — Z87891 Personal history of nicotine dependence: Secondary | ICD-10-CM | POA: Insufficient documentation

## 2014-09-24 DIAGNOSIS — S79922A Unspecified injury of left thigh, initial encounter: Secondary | ICD-10-CM | POA: Diagnosis not present

## 2014-09-24 DIAGNOSIS — F419 Anxiety disorder, unspecified: Secondary | ICD-10-CM | POA: Insufficient documentation

## 2014-09-24 DIAGNOSIS — M5416 Radiculopathy, lumbar region: Secondary | ICD-10-CM

## 2014-09-24 DIAGNOSIS — Y9289 Other specified places as the place of occurrence of the external cause: Secondary | ICD-10-CM | POA: Diagnosis not present

## 2014-09-24 DIAGNOSIS — W108XXA Fall (on) (from) other stairs and steps, initial encounter: Secondary | ICD-10-CM | POA: Insufficient documentation

## 2014-09-24 DIAGNOSIS — Y998 Other external cause status: Secondary | ICD-10-CM | POA: Insufficient documentation

## 2014-09-24 DIAGNOSIS — I10 Essential (primary) hypertension: Secondary | ICD-10-CM | POA: Insufficient documentation

## 2014-09-24 DIAGNOSIS — Y9389 Activity, other specified: Secondary | ICD-10-CM | POA: Diagnosis not present

## 2014-09-24 DIAGNOSIS — Z8719 Personal history of other diseases of the digestive system: Secondary | ICD-10-CM | POA: Insufficient documentation

## 2014-09-24 DIAGNOSIS — Z79899 Other long term (current) drug therapy: Secondary | ICD-10-CM | POA: Insufficient documentation

## 2014-09-24 LAB — CBG MONITORING, ED: Glucose-Capillary: 121 mg/dL — ABNORMAL HIGH (ref 65–99)

## 2014-09-24 MED ORDER — KETOROLAC TROMETHAMINE 10 MG PO TABS: 10.0000 mg | ORAL_TABLET | Freq: Four times a day (QID) | ORAL | Status: DC | PRN

## 2014-09-24 MED ORDER — METHOCARBAMOL 500 MG PO TABS
500.0000 mg | ORAL_TABLET | Freq: Once | ORAL | Status: AC
  Administered 2014-09-24: 500 mg via ORAL
  Filled 2014-09-24: qty 1

## 2014-09-24 MED ORDER — OXYCODONE-ACETAMINOPHEN 5-325 MG PO TABS
1.0000 | ORAL_TABLET | Freq: Once | ORAL | Status: AC
  Administered 2014-09-24: 1 via ORAL
  Filled 2014-09-24: qty 1

## 2014-09-24 MED ORDER — OXYCODONE-ACETAMINOPHEN 5-325 MG PO TABS: 1.0000 | ORAL_TABLET | ORAL | Status: DC | PRN

## 2014-09-24 MED ORDER — METHOCARBAMOL 500 MG PO TABS: 500.0000 mg | ORAL_TABLET | Freq: Three times a day (TID) | ORAL | Status: DC

## 2014-09-24 MED ORDER — KETOROLAC TROMETHAMINE 60 MG/2ML IM SOLN
60.0000 mg | Freq: Once | INTRAMUSCULAR | Status: AC
  Administered 2014-09-24: 60 mg via INTRAMUSCULAR
  Filled 2014-09-24: qty 2

## 2014-09-24 NOTE — Discharge Instructions (Signed)

## 2014-09-24 NOTE — ED Provider Notes (Signed)
CSN: [id number]     Arrival date & time 09/24/14  1224 History   First MD Initiated Contact with Patient 09/24/14 1329     Chief Complaint  Patient presents with  . Back Pain     (Consider location/radiation/quality/duration/timing/severity/associated sxs/prior Treatment) HPI  RICKARDO BRINEGAR is a 44 y.o. male who presents to the Emergency Department complaining of low back pain that has been worsening for several days.  He states that he has been doing yard work recently and "slipped" last evening going up some steps, but denies fall or significant injury.  Describes pain to his lower back that occassionally "shoots" around his waist.  He also describes a tingling, stinging sensation to the top of his left thigh.  Pain is worse with standing or movement.  He took two vicodin this morning with some relief.  He denies abd pain, fever, chills, vomiting, numbness of the lower extremities, and urine or bowel retention or incontinence.    Past Medical History  Diagnosis Date  . Anxiety   . Hypertension     "Dr Karie Kirks took me off meds"  diet control  . GERD (gastroesophageal reflux disease)     occ   Past Surgical History  Procedure Laterality Date  . Right knee  orif right patella Keeling 1993  . Knee surgery    . Left elbow    . Right foot      forgein body removal  . Elbow surgery Left   . Foot surgery    . Carpal tunnel release Right 06/25/2013    Procedure: CARPAL TUNNEL RELEASE;  Surgeon: Carole Civil, MD;  Location: [facility];  Service: Orthopedics;  Laterality: Right;  . Hernia repair Right     inguinal- age 48  . Carpal tunnel release Left 07/25/2013    Procedure: LEFT CARPAL TUNNEL RELEASE;  Surgeon: Carole Civil, MD;  Location: [facility];  Service: Orthopedics;  Laterality: Left;  . Septoplasty    . Lumbar laminectomy/decompression microdiscectomy Left 10/06/2013    Procedure: Left Lumbar Three-four microdiskectomy;  Surgeon: Ophelia Charter, MD;  Location: [facility];  Service: Neurosurgery;  Laterality: Left;  Left Lumbar Three-four microdiskectomy  . Back surgery     Family History  Problem Relation Age of Onset  . Heart disease    . Arthritis    . Cancer    . Asthma    . Diabetes    . Kidney disease     History  Substance Use Topics  . Smoking status: Former Smoker -- 1.00 packs/day for 15 years    Types: Cigarettes    Quit date: 12/21/2012  . Smokeless tobacco: Not on file  . Alcohol Use: Yes     Comment: occ  6-8 months last    Review of Systems  Constitutional: Negative for fever.  Respiratory: Negative for shortness of breath.   Gastrointestinal: Negative for vomiting, abdominal pain and constipation.  Genitourinary: Negative for dysuria, hematuria, flank pain, decreased urine volume and difficulty urinating.  Musculoskeletal: Positive for back pain. Negative for joint swelling.  Skin: Negative for rash.  Neurological: Negative for weakness and numbness.  All other systems reviewed and are negative.     Allergies  Review of patient's allergies indicates no known allergies.  Home Medications   Prior to Admission medications   Medication Sig Start Date End Date Taking? Authorizing Provider  clonazePAM (KLONOPIN) 1 MG tablet Take 1 mg by mouth 3 (three) times daily.   Yes  Historical Provider, MD  HYDROcodone-acetaminophen (NORCO/VICODIN) 5-325 MG per tablet Take 1-2 tablets by mouth every 6 (six) hours as needed. 07/30/14  Yes Fredia Sorrow, MD  ibuprofen (ADVIL,MOTRIN) 200 MG tablet Take 400 mg by mouth every 6 (six) hours as needed for moderate pain.   Yes Historical Provider, MD  zolpidem (AMBIEN) 5 MG tablet Take 5 mg by mouth at bedtime as needed for sleep.   Yes Historical Provider, MD  dextromethorphan-guaiFENesin (MUCINEX DM) 30-600 MG per 12 hr tablet Take 1 tablet by mouth 2 (two) times daily. Patient not taking: Reported on 09/24/2014 07/30/14   Fredia Sorrow, MD  ketorolac (TORADOL) 10 MG tablet Take 1 tablet  (10 mg total) by mouth every 6 (six) hours as needed. With food 09/24/14   Javone Ybanez, PA-C  methocarbamol (ROBAXIN) 500 MG tablet Take 1 tablet (500 mg total) by mouth 3 (three) times daily. 09/24/14   Lisseth Brazeau, PA-C  ondansetron (ZOFRAN ODT) 4 MG disintegrating tablet Take 1 tablet (4 mg total) by mouth every 8 (eight) hours as needed. Patient not taking: Reported on 09/24/2014 07/30/14   Fredia Sorrow, MD  oxyCODONE-acetaminophen (PERCOCET/ROXICET) 5-325 MG per tablet Take 1 tablet by mouth every 4 (four) hours as needed. 09/24/14   Charistopher Rumble, PA-C   BP 149/97 mmHg  Pulse 63  Temp(Src) 98.9 F (37.2 C) (Oral)  Resp 17  Ht 5\' 11"  (1.803 m)  Wt 226 lb (102.513 kg)  BMI 31.53 kg/m2  SpO2 100% Physical Exam  Constitutional: He is oriented to person, place, and time. He appears well-developed and well-nourished. No distress.  HENT:  Head: Normocephalic and atraumatic.  Neck: Normal range of motion. Neck supple.  Cardiovascular: Normal rate, regular rhythm, normal heart sounds and intact distal pulses.   No murmur heard. Pulmonary/Chest: Effort normal and breath sounds normal. No respiratory distress.  Abdominal: Soft. He exhibits no distension. There is no tenderness.  Musculoskeletal: He exhibits tenderness. He exhibits no edema.       Lumbar back: He exhibits tenderness and pain. He exhibits normal range of motion, no swelling, no deformity, no laceration and normal pulse.  ttp of the lower lumbar spine and bilateral paraspinal muscles.  DP pulses are brisk and symmetrical.  Distal sensation intact.  Hip Flexors/Extensors are intact.  Pt has 4/5 strength against resistance of bilateral lower extremities.     Neurological: He is alert and oriented to person, place, and time. He has normal strength. No sensory deficit. He exhibits normal muscle tone. Coordination and gait normal.  Reflex Scores:      Patellar reflexes are 2+ on the right side and 2+ on the left side.       Achilles reflexes are 2+ on the right side and 2+ on the left side. Skin: Skin is warm and dry. No rash noted.  Nursing note and vitals reviewed.   ED Course  Procedures (including critical care time) Labs Review Labs Reviewed  CBG MONITORING, ED - Abnormal; Notable for the following:    Glucose-Capillary 121 (*)    All other components within normal limits    Imaging Review No results found.   EKG Interpretation None      MDM   Final diagnoses:  Lumbar radicular pain   Pt is well appearing.  Hx of low back pain.  Pain exacerbated after lifting.  Ambulates with antalgic gait.  No focal neuro deficits.  No concerning sx's for emergent neurological process.    Pt feeling much better after  medications.  Agrees to arrange PMD f/u or f/u with his surgeon.       Bufford Lope 09/24/14 2147  Noemi Chapel, MD 09/26/14 [phone]

## 2014-09-24 NOTE — ED Notes (Signed)
Pain R lower back that radiates around waistband with movement.  Pain worse lying and sitting, better standing.  Took 2 Vicodin at 0900 this AM and one at 1200 which gave minimal relief.

## 2014-09-24 NOTE — ED Notes (Signed)
Patient with no complaints at this time. Respirations even and unlabored. Skin warm/dry. Discharge instructions reviewed with patient at this time. Patient given opportunity to voice concerns/ask questions. Patient discharged at this time and left Emergency Department with steady gait.   

## 2014-09-24 NOTE — ED Notes (Signed)
icepack applied to back

## 2014-09-24 NOTE — ED Notes (Signed)
Pt states he slipped a little while going up steps last night, but states he was not jarred. Pt states afterwards, he began having pain to lower back stating tingling at "butt bone" just below prior incision due to back surgery. Pt states tingling sown to top of left thigh and groin area.

## 2014-09-28 ENCOUNTER — Encounter (HOSPITAL_COMMUNITY): Payer: Self-pay | Admitting: *Deleted

## 2014-09-28 ENCOUNTER — Emergency Department (HOSPITAL_COMMUNITY): Payer: Medicaid Other

## 2014-09-28 ENCOUNTER — Emergency Department (HOSPITAL_COMMUNITY)
Admission: EM | Admit: 2014-09-28 | Discharge: 2014-09-28 | Disposition: A | Discharge status: Home / Self Care | Payer: Medicaid Other | Attending: Emergency Medicine | Admitting: Emergency Medicine

## 2014-09-28 DIAGNOSIS — F419 Anxiety disorder, unspecified: Secondary | ICD-10-CM | POA: Insufficient documentation

## 2014-09-28 DIAGNOSIS — Z8719 Personal history of other diseases of the digestive system: Secondary | ICD-10-CM | POA: Diagnosis not present

## 2014-09-28 DIAGNOSIS — Z79899 Other long term (current) drug therapy: Secondary | ICD-10-CM | POA: Insufficient documentation

## 2014-09-28 DIAGNOSIS — Z9889 Other specified postprocedural states: Secondary | ICD-10-CM | POA: Insufficient documentation

## 2014-09-28 DIAGNOSIS — R200 Anesthesia of skin: Secondary | ICD-10-CM | POA: Insufficient documentation

## 2014-09-28 DIAGNOSIS — I10 Essential (primary) hypertension: Secondary | ICD-10-CM | POA: Diagnosis not present

## 2014-09-28 DIAGNOSIS — R61 Generalized hyperhidrosis: Secondary | ICD-10-CM | POA: Insufficient documentation

## 2014-09-28 DIAGNOSIS — M545 Low back pain: Secondary | ICD-10-CM | POA: Diagnosis present

## 2014-09-28 DIAGNOSIS — M5186 Other intervertebral disc disorders, lumbar region: Secondary | ICD-10-CM | POA: Diagnosis not present

## 2014-09-28 DIAGNOSIS — M5126 Other intervertebral disc displacement, lumbar region: Secondary | ICD-10-CM

## 2014-09-28 MED ORDER — HYDROMORPHONE HCL 2 MG/ML IJ SOLN
2.0000 mg | Freq: Once | INTRAMUSCULAR | Status: AC
  Administered 2014-09-28: 2 mg via INTRAVENOUS
  Filled 2014-09-28: qty 1

## 2014-09-28 MED ORDER — KETOROLAC TROMETHAMINE 60 MG/2ML IM SOLN
60.0000 mg | Freq: Once | INTRAMUSCULAR | Status: AC
  Administered 2014-09-28: 60 mg via INTRAMUSCULAR
  Filled 2014-09-28: qty 2

## 2014-09-28 MED ORDER — ONDANSETRON 4 MG PO TBDP
4.0000 mg | ORAL_TABLET | Freq: Once | ORAL | Status: AC
  Administered 2014-09-28: 4 mg via ORAL
  Filled 2014-09-28: qty 1

## 2014-09-28 MED ORDER — OXYCODONE-ACETAMINOPHEN 5-325 MG PO TABS: 2.0000 | ORAL_TABLET | ORAL | Status: DC | PRN

## 2014-09-28 MED ORDER — PREDNISONE 10 MG (21) PO TBPK
ORAL_TABLET | ORAL | Status: DC
Start: 2014-09-28 — End: 2015-06-14

## 2014-09-28 NOTE — Discharge Instructions (Signed)
Herniated Disk A herniated disk occurs when a disk in your spine bulges out too far. This condition is also called a ruptured disk or slipped disk. Your spine (backbone) is made up of bones called vertebrae. Between each pair of vertebrae is an oval disk with a soft, spongy center that acts as a shock absorber when you move. The spongy center is surrounded by a tough outer ring. When you have a herniated disk, the spongy center of the disk bulges out or ruptures through the outer ring. A herniated disk can press on a nerve between your vertebrae and cause pain. A herniated disk can occur anywhere in your back or neck area, but the lower back is the most common spot. CAUSES  In many cases, a herniated disk occurs just from getting older. As you age, the spongy insides of your disks tend to shrink and dry out. A herniated disk can result from gradual wear and tear. Injury or sudden strain can also cause a herniated disk.  RISK FACTORS Aging is the main risk factor for a herniated disk. Other risk factors include:  Being a man between the ages of 55 and 8 years.  Having a job that requires heavy lifting, bending, or twisting.  Having a job that requires long hours of driving.  Not getting enough exercise.  Being overweight.  Smoking. SIGNS AND SYMPTOMS  Signs and symptoms depend on which disk is herniated.  For a herniated disk in the lower back, you may have sharp pain in:  One part of your leg, hip, or buttocks.  The back of your calf.  The top or sole of your foot (sciatica).   For a herniated disk in the neck, you may feel pain:  When you move your neck.  Near or over your shoulder blade.  That moves to your upper arm, forearm, or fingers.   You may also have muscle weakness. It may be hard to:  Lift your leg or arm.  Stand on your toes.  Squeeze tightly with one of your hands.  Other symptoms can include:  Numbness or tingling in the affected areas of your  body.  Loss of bladder or bowel control. This is a rare but serious sign of a severe herniated disk in the lower back. DIAGNOSIS  Your health care provider will do a physical exam. During this exam, you may have to move certain body parts or assume various positions. For example, your health care provider may do the straight-leg test. This is a good way to test for a herniated disk in your lower back. In this test, the health care provider lifts your leg while you lie on your back. This is to see if you feel pain down your leg. Your health care provider will also check for numbness or loss of feeling.  Your health care provider will also check your:  Reflexes.  Muscle strength.  Posture.  Other tests may be done to help in making a diagnosis. These may include:  An X-ray of the spine to rule out other causes of back pain.   Other imaging studies, such as an MRI or CT scan. This is to check whether the herniated disk is pressing on your spinal canal.  Electromyography (EMG). This test checks the nerves that control muscles. It is sometimes used to identify the specific area of nerve involvement.  TREATMENT  In many cases, herniated disk symptoms go away over a period of days or weeks. You will most  likely be free of symptoms in 3-4 months. Treatment may include the following:  The initial treatment for a herniated disk is ashort period of rest.  Bed rest is often limited to 1 or 2 days. Resting for too long delays recovery.  If you have a herniated disk in your lower back, you should avoid sitting as much as possible because sitting increases pressure on the disk.  Medicines. These may include:   Nonsteroidal anti-inflammatory drugs (NSAIDs).  Muscle relaxants for back spasms.  Narcotic pain medicine if your pain is very bad.   Steroid injections. You may need these along the involved nerve root to help control pain. The steroid is injected in the area of the herniated disk.  It helps by reducing swelling around the disk.  Physical therapy. This may include exercises to strengthen the muscles that help support your spine.   You may need surgery if other treatments do not work.  HOME CARE INSTRUCTIONS Follow all your health care provider's instructions. These may include:  Take all medicines as directed by your health care provider.  Rest for 2 days and then start moving.  Do not sit or stand for long periods of time.  Maintain good posture when sitting and standing.  Avoid movements that cause pain, such as bending or lifting.  When you are able to start lifting things again:  [place] with your knees.  Keep your back straight.  Hold heavy objects close to your body.  If you are overweight, ask your health care provider to help you start a weight-loss program.  When you are able to start exercising, ask your health care provider how much and what type of exercise is best for you.  Work with a physical therapist on stretching and strengthening exercises for your back.  Do not wear high-heeled shoes.  Do not sleep on your belly.  Do not smoke.  Keep all follow-up visits as directed by your health care provider. SEEK MEDICAL CARE IF:  You have back or neck pain that is not getting better after 4 weeks.  You have very bad pain in your back or neck.  You develop numbness, tingling, or weakness along with pain. SEEK IMMEDIATE MEDICAL CARE IF:   You have numbness, tingling, or weakness that makes you unable to use your arms or legs.  You lose control of your bladder or bowels.  You have dizziness or fainting.  You have shortness of breath.  MAKE SURE YOU:   Understand these instructions.  Will watch your condition.  Will get help right away if you are not doing well or get worse. Document Released: 03/24/2000 Document Revised: 08/11/2013 Document Reviewed: 02/28/2013 [facility] Patient Information 2015 [place], [place]. This information  is not intended to replace advice given to you by your health care provider. Make sure you discuss any questions you have with your health care provider.

## 2014-09-28 NOTE — ED Notes (Signed)
Low back pain with numbness down lt leg, seen here for same 6/16.  Has appt with Dr Arnoldo Morale this week

## 2014-09-28 NOTE — ED Provider Notes (Signed)
CSN: [id number]     Arrival date & time 09/28/14  1217 History   None   This chart was scribed for non-physician practitioner, Hollace Kinnier. Saunders Revel, working with Noemi Chapel, MD by Terressa Koyanagi, ED Scribe. This patient was seen in room APFT22/APFT22 and the patient's care was started at 1:10 PM.  Chief Complaint  Patient presents with  . Back Pain   The history is provided by the patient. No language interpreter was used.   PCP: Robert Bellow, MD HPI Comments: Jamie [name] is a 44 y.o. male, with PMH noted below including back surgery for a bulging disc, who presents to the Emergency Department complaining of atraumatic, chronic, burning, lower back pain with associated numbness and tingling to the upper thigh and diaphoresis, worsening this morning around 4AM. Pt reports he spoke with his PCP's office this morning and he was directed to come to the ED if the burning pain in his back persists. Pt was seen for the same at the ED on 09/24/14. Pt is scheduled to follow up with Dr. Ophelia Charter, MD: [place] this week. Pt denies loss of bowel or bladder control.   Past Medical History  Diagnosis Date  . Anxiety   . Hypertension     "Dr Karie Kirks took me off meds"  diet control  . GERD (gastroesophageal reflux disease)     occ   Past Surgical History  Procedure Laterality Date  . Right knee  orif right patella Keeling 1993  . Knee surgery    . Left elbow    . Right foot      forgein body removal  . Elbow surgery Left   . Foot surgery    . Carpal tunnel release Right 06/25/2013    Procedure: CARPAL TUNNEL RELEASE;  Surgeon: Carole Civil, MD;  Location: [facility];  Service: Orthopedics;  Laterality: Right;  . Hernia repair Right     inguinal- age 60  . Carpal tunnel release Left 07/25/2013    Procedure: LEFT CARPAL TUNNEL RELEASE;  Surgeon: Carole Civil, MD;  Location: [facility];  Service: Orthopedics;  Laterality: Left;  . Septoplasty     . Lumbar laminectomy/decompression microdiscectomy Left 10/06/2013    Procedure: Left Lumbar Three-four microdiskectomy;  Surgeon: Ophelia Charter, MD;  Location: [facility];  Service: Neurosurgery;  Laterality: Left;  Left Lumbar Three-four microdiskectomy  . Back surgery     Family History  Problem Relation Age of Onset  . Heart disease    . Arthritis    . Cancer    . Asthma    . Diabetes    . Kidney disease     History  Substance Use Topics  . Smoking status: Former Smoker -- 1.00 packs/day for 15 years    Types: Cigarettes    Quit date: 12/21/2012  . Smokeless tobacco: Not on file  . Alcohol Use: Yes     Comment: occ  6-8 months last    Review of Systems  Constitutional: Positive for diaphoresis. Negative for fever and chills.  Musculoskeletal: Positive for back pain.  Neurological: Positive for numbness (upper thigh).   Allergies  Review of patient's allergies indicates no known allergies.  Home Medications   Prior to Admission medications   Medication Sig Start Date End Date Taking? Authorizing Provider  clonazePAM (KLONOPIN) 1 MG tablet Take 1 mg by mouth 3 (three) times daily.   Yes Historical Provider, MD  ibuprofen (ADVIL,MOTRIN) 200 MG  tablet Take 400 mg by mouth every 6 (six) hours as needed for moderate pain.   Yes Historical Provider, MD  ketorolac (TORADOL) 10 MG tablet Take 1 tablet (10 mg total) by mouth every 6 (six) hours as needed. With food Patient taking differently: Take 10 mg by mouth every 6 (six) hours as needed for moderate pain. With food 09/24/14  Yes Tammy Triplett, PA-C  methocarbamol (ROBAXIN) 500 MG tablet Take 1 tablet (500 mg total) by mouth 3 (three) times daily. 09/24/14  Yes Tammy Triplett, PA-C  oxyCODONE-acetaminophen (PERCOCET/ROXICET) 5-325 MG per tablet Take 1 tablet by mouth every 4 (four) hours as needed. Patient taking differently: Take 1 tablet by mouth every 4 (four) hours as needed for moderate pain.  09/24/14  Yes Tammy  Triplett, PA-C  zolpidem (AMBIEN) 5 MG tablet Take 5 mg by mouth at bedtime as needed for sleep.   Yes Historical Provider, MD  HYDROcodone-acetaminophen (NORCO/VICODIN) 5-325 MG per tablet Take 1-2 tablets by mouth every 6 (six) hours as needed. Patient not taking: Reported on 09/28/2014 07/30/14   Fredia Sorrow, MD   Triage Vitals: BP 136/97 mmHg  Pulse 73  Temp(Src) 98.5 F (36.9 C) (Oral)  Resp 18  Ht 5\' 11"  (1.803 m)  Wt 226 lb (102.513 kg)  BMI 31.53 kg/m2  SpO2 99% Physical Exam  Constitutional: He is oriented to person, place, and time. He appears well-developed and well-nourished. No distress.  HENT:  Head: Normocephalic and atraumatic.  Eyes: Conjunctivae and EOM are normal.  Neck: Neck supple.  Cardiovascular: Normal rate.   Pulmonary/Chest: Effort normal. No respiratory distress.  Musculoskeletal: He exhibits tenderness.  Equal reflexes bilateral lower extremities. Healed surgical incision in lumbar region. Diffusely tender in lumbar region.  Neurological: He is alert and oriented to person, place, and time.  Skin: Skin is warm and dry.  Psychiatric: He has a normal mood and affect. His behavior is normal.  Nursing note and vitals reviewed.   ED Course  Procedures (including critical care time) DIAGNOSTIC STUDIES: Oxygen Saturation is 99% on RA, nl by my interpretation.    COORDINATION OF CARE: 1:14 PM-Discussed treatment plan which includes imaging with pt at bedside and pt agreed to plan.   Labs Review Labs Reviewed - No data to display  Imaging Review Mr Lumbar Spine Wo Contrast  09/28/2014   CLINICAL DATA:  44 year old male with increased lumbar back pain radiating to the left lower extremity with burning and numbness. Symptoms increased since lifting injury 6 weeks ago. Prior lumbar surgery. Subsequent encounter.  EXAM: MRI LUMBAR SPINE WITHOUT CONTRAST  TECHNIQUE: Multiplanar, multisequence MR imaging of the lumbar spine was performed. No intravenous  contrast was administered.  COMPARISON:  Intraoperative lumbar radiographs 10/06/2013. Lumbar MRI 09/25/2013.  FINDINGS: Same numbering system as on 09/25/2013. Intraoperative images suggest surgery at the L3-L4 level since the prior MRI.  Stable vertebral height and alignment. No marrow edema or evidence of acute osseous abnormality. 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  Visualized lower thoracic spinal cord is normal with conus medularis at L1.  Negative visualized abdominal viscera. Mild postoperative changes to the posterior paraspinal soft tissues.  T11-T12:  Stable mild facet hypertrophy.  T12-L1:  Stable and negative.  L1-L2:  Stable and negative.  L2-L3: Mild disc desiccation, loss of disc height, and circumferential disc bulging is not significantly changed. Stable mild facet hypertrophy. No significant stenosis.  L3-L4: Interval treated left paracentral disc extrusion that was present on the prior MRI.  Markedly improved thecal sac and left lateral recess patency. Residual circumferential disc bulge with mildly lobulated posterior component. Mild facet hypertrophy. Trace facet joint fluid.  L4-L5: Chronic disc desiccation and disc bulge with new broad-based right paracentral disc extrusion with mostly caudal migration of disc. See series 3, image 7 and series 6, image 30. Subsequent new right lateral recess stenosis, moderate to severe. Superimposed mild facet hypertrophy is stable. New borderline to mild spinal stenosis. Mild to moderate bilateral L4 foraminal stenosis also appears increased.  L5-S1: Stable and negative disc. Moderate facet hypertrophy is stable. Stable mild left L5 foraminal stenosis.  IMPRESSION: 1. Interval treated L3-L4 disc herniation. 2. New L4-L5 disc herniation with mostly caudal migration of disc. New moderate to severe right lateral recess stenosis, borderline to mild spinal stenosis, and increased appearance of mild to moderate bilateral L4 foraminal  stenosis. 3. Other lumbar levels are stable.   Electronically Signed   By: Genevie Ann M.D.   On: 09/28/2014 15:30     EKG Interpretation None      MDM  Percocet, prednisone  See Dr. Arnoldo Morale as scheduled.     Final diagnoses:  Numbness  Herniated lumbar intervertebral disc     I personally performed the services in this documentation, which was scribed in my presence.  The recorded information has been reviewed and considered.   Ronnald Collum.   Fransico Meadow, PA-C 09/28/14 [place], PA-C 09/28/14 1602  Noemi Chapel, MD 09/30/14 [phone]

## 2014-09-28 NOTE — ED Notes (Signed)
PT c/o burning and pain to lower back and pain radiating down left leg and numbness to left leg. PT states he has appt on Thursday with Dr. Arnoldo Morale in [place] for back pain.

## 2014-09-28 NOTE — ED Notes (Signed)
PA at bedside.

## 2015-04-29 ENCOUNTER — Emergency Department (HOSPITAL_COMMUNITY)
Admission: EM | Admit: 2015-04-29 | Discharge: 2015-04-29 | Disposition: A | Discharge status: Home / Self Care | Payer: Medicaid Other | Attending: Emergency Medicine | Admitting: Emergency Medicine

## 2015-04-29 ENCOUNTER — Encounter (HOSPITAL_COMMUNITY): Payer: Self-pay | Admitting: Emergency Medicine

## 2015-04-29 DIAGNOSIS — I10 Essential (primary) hypertension: Secondary | ICD-10-CM | POA: Diagnosis not present

## 2015-04-29 DIAGNOSIS — Z87891 Personal history of nicotine dependence: Secondary | ICD-10-CM | POA: Diagnosis not present

## 2015-04-29 DIAGNOSIS — R1030 Lower abdominal pain, unspecified: Secondary | ICD-10-CM | POA: Insufficient documentation

## 2015-04-29 DIAGNOSIS — Z8719 Personal history of other diseases of the digestive system: Secondary | ICD-10-CM | POA: Diagnosis not present

## 2015-04-29 DIAGNOSIS — Z792 Long term (current) use of antibiotics: Secondary | ICD-10-CM | POA: Insufficient documentation

## 2015-04-29 DIAGNOSIS — Z79899 Other long term (current) drug therapy: Secondary | ICD-10-CM | POA: Diagnosis not present

## 2015-04-29 DIAGNOSIS — R339 Retention of urine, unspecified: Secondary | ICD-10-CM | POA: Insufficient documentation

## 2015-04-29 DIAGNOSIS — F419 Anxiety disorder, unspecified: Secondary | ICD-10-CM | POA: Insufficient documentation

## 2015-04-29 LAB — BASIC METABOLIC PANEL
Anion gap: 8 (ref 5–15)
BUN: 22 mg/dL — ABNORMAL HIGH (ref 6–20)
CO2: 27 mmol/L (ref 22–32)
Calcium: 9.4 mg/dL (ref 8.9–10.3)
Chloride: 100 mmol/L — ABNORMAL LOW (ref 101–111)
Creatinine, Ser: 0.99 mg/dL (ref 0.61–1.24)
GFR calc Af Amer: >60 mL/min (ref >60)
GFR calc non Af Amer: >60 mL/min (ref >60)
Glucose, Bld: 103 mg/dL — ABNORMAL HIGH (ref 65–99)
Potassium: 4.6 mmol/L (ref 3.5–5.1)
Sodium: 135 mmol/L (ref 135–145)

## 2015-04-29 LAB — URINALYSIS, ROUTINE W REFLEX MICROSCOPIC
Bilirubin Urine: NEGATIVE
Glucose, UA: NEGATIVE mg/dL
Ketones, ur: NEGATIVE mg/dL
Leukocytes, UA: NEGATIVE
Nitrite: NEGATIVE
Protein, ur: NEGATIVE mg/dL
Specific Gravity, Urine: 1.025 (ref 1.005–1.030)
pH: 6 (ref 5.0–8.0)

## 2015-04-29 LAB — CBC WITH DIFFERENTIAL/PLATELET
Basophils Absolute: 0 10*3/uL (ref 0.0–0.1)
Basophils Relative: 0 %
Eosinophils Absolute: 0.1 10*3/uL (ref 0.0–0.7)
Eosinophils Relative: 1 %
HCT: 43.1 % (ref 39.0–52.0)
Hemoglobin: 15.5 g/dL (ref 13.0–17.0)
Lymphocytes Relative: 9 %
Lymphs Abs: 1 10*3/uL (ref 0.7–4.0)
MCH: 33 pg (ref 26.0–34.0)
MCHC: 36 g/dL (ref 30.0–36.0)
MCV: 91.9 fL (ref 78.0–100.0)
Monocytes Absolute: 0.7 10*3/uL (ref 0.1–1.0)
Monocytes Relative: 7 %
Neutro Abs: 9.1 10*3/uL — ABNORMAL HIGH (ref 1.7–7.7)
Neutrophils Relative %: 83 %
Platelets: 173 10*3/uL (ref 150–400)
RBC: 4.69 MIL/uL (ref 4.22–5.81)
RDW: 13 % (ref 11.5–15.5)
WBC: 10.8 10*3/uL — ABNORMAL HIGH (ref 4.0–10.5)

## 2015-04-29 LAB — URINE MICROSCOPIC-ADD ON

## 2015-04-29 MED ORDER — HYDROMORPHONE HCL 2 MG/ML IJ SOLN
2.0000 mg | Freq: Once | INTRAMUSCULAR | Status: AC
  Administered 2015-04-29: 2 mg via INTRAMUSCULAR
  Filled 2015-04-29: qty 1

## 2015-04-29 MED ORDER — PHENAZOPYRIDINE HCL 200 MG PO TABS: 200.0000 mg | ORAL_TABLET | Freq: Three times a day (TID) | ORAL | Status: DC

## 2015-04-29 MED ORDER — PHENAZOPYRIDINE HCL 100 MG PO TABS
200.0000 mg | ORAL_TABLET | Freq: Once | ORAL | Status: AC
  Administered 2015-04-29: 200 mg via ORAL
  Filled 2015-04-29: qty 2

## 2015-04-29 NOTE — ED Provider Notes (Signed)
CSN: [id number]     Arrival date & time 04/29/15  1833 History   First MD Initiated Contact with Patient 04/29/15 1915     Chief Complaint  Patient presents with  . Urinary Retention     (Consider location/radiation/quality/duration/timing/severity/associated sxs/prior Treatment) The history is provided by the patient.   45 year old male with about a 3 day history of some difficulty urinating. Unable to urinate at all today. Patient with significant discomfort in the suprapubic area. Patient has a history of back pain problems in the past but has dysphagia. Patient has no pain radiating in the legs no saddle anesthesia no numbness or weakness to the toes or feet.  Patient's thinking that perhaps medicine that his doctor gave him for arthritis may be the cause. Patient denies any fevers. No nausea no vomiting.  Past Medical History  Diagnosis Date  . Anxiety   . Hypertension     "Dr Karie Kirks took me off meds"  diet control  . GERD (gastroesophageal reflux disease)     occ   Past Surgical History  Procedure Laterality Date  . Right knee  orif right patella Keeling 1993  . Knee surgery    . Left elbow    . Right foot      forgein body removal  . Elbow surgery Left   . Foot surgery    . Carpal tunnel release Right 06/25/2013    Procedure: CARPAL TUNNEL RELEASE;  Surgeon: Carole Civil, MD;  Location: [facility];  Service: Orthopedics;  Laterality: Right;  . Hernia repair Right     inguinal- age 51  . Carpal tunnel release Left 07/25/2013    Procedure: LEFT CARPAL TUNNEL RELEASE;  Surgeon: Carole Civil, MD;  Location: [facility];  Service: Orthopedics;  Laterality: Left;  . Septoplasty    . Lumbar laminectomy/decompression microdiscectomy Left 10/06/2013    Procedure: Left Lumbar Three-four microdiskectomy;  Surgeon: Ophelia Charter, MD;  Location: [place] [facility];  Service: Neurosurgery;  Laterality: Left;  Left Lumbar Three-four microdiskectomy  . Back surgery     Family History   Problem Relation Age of Onset  . Heart disease    . Arthritis    . Cancer    . Asthma    . Diabetes    . Kidney disease     Social History  Substance Use Topics  . Smoking status: Former Smoker -- 1.00 packs/day for 15 years    Types: Cigarettes    Quit date: 12/21/2012  . Smokeless tobacco: None  . Alcohol Use: Yes     Comment: occ  6-8 months last    Review of Systems  Constitutional: Negative for fever.  HENT: Negative for congestion.   Eyes: Negative for redness.  Respiratory: Negative for shortness of breath.   Cardiovascular: Negative for chest pain.  Gastrointestinal: Positive for abdominal pain. Negative for nausea and vomiting.  Genitourinary: Positive for difficulty urinating.  Musculoskeletal: Negative for back pain.  Neurological: Negative for syncope.  Hematological: Does not bruise/bleed easily.  Psychiatric/Behavioral: Negative for confusion.      Allergies  Review of patient's allergies indicates no known allergies.  Home Medications   Prior to Admission medications   Medication Sig Start Date End Date Taking? Authorizing Provider  clonazePAM (KLONOPIN) 1 MG tablet Take 1 mg by mouth 3 (three) times daily.   Yes Historical Provider, MD  naproxen (NAPROSYN) 500 MG tablet Take 500 mg by mouth 2 (two) times daily as needed. Pain or arthritis 01/31/15  Yes Historical Provider, MD  penicillin v potassium (VEETID) 500 MG tablet Take 500 mg by mouth 4 (four) times daily. Until gone (started 04/24/15) 04/24/15  Yes Historical Provider, MD  traMADol (ULTRAM) 50 MG tablet Take 100 mg by mouth 3 (three) times daily. 04/22/15  Yes Historical Provider, MD  zolpidem (AMBIEN) 5 MG tablet Take 5 mg by mouth at bedtime as needed for sleep.   Yes Historical Provider, MD  HYDROcodone-acetaminophen (NORCO/VICODIN) 5-325 MG per tablet Take 1-2 tablets by mouth every 6 (six) hours as needed. Patient not taking: Reported on 09/28/2014 07/30/14   Fredia Sorrow, MD  ibuprofen  (ADVIL,MOTRIN) 200 MG tablet Take 400 mg by mouth every 6 (six) hours as needed for moderate pain.    Historical Provider, MD  ibuprofen (ADVIL,MOTRIN) 800 MG tablet Take 800 mg by mouth 3 (three) times daily as needed. pain 04/24/15   Historical Provider, MD  ketorolac (TORADOL) 10 MG tablet Take 1 tablet (10 mg total) by mouth every 6 (six) hours as needed. With food Patient not taking: Reported on 04/29/2015 09/24/14   Tammy Triplett, PA-C  methocarbamol (ROBAXIN) 500 MG tablet Take 1 tablet (500 mg total) by mouth 3 (three) times daily. Patient not taking: Reported on 04/29/2015 09/24/14   Tammy Triplett, PA-C  oxyCODONE-acetaminophen (PERCOCET/ROXICET) 5-325 MG per tablet Take 2 tablets by mouth every 4 (four) hours as needed for severe pain. Patient not taking: Reported on 04/29/2015 09/28/14   Fransico Meadow, PA-C  phenazopyridine (PYRIDIUM) 200 MG tablet Take 1 tablet (200 mg total) by mouth 3 (three) times daily. 04/29/15   Fredia Sorrow, MD  predniSONE (STERAPRED UNI-PAK 21 TAB) 10 MG (21) TBPK tablet 6,5,4,3,2,1 taper Patient not taking: Reported on 04/29/2015 09/28/14   Fransico Meadow, PA-C   BP 116/82 mmHg  Pulse 84  Temp(Src) 98.1 F (36.7 C) (Oral)  Resp 18  Ht 5\' 11"  (1.803 m)  Wt 101.606 kg  BMI 31.26 kg/m2  SpO2 100% Physical Exam  Constitutional: He is oriented to person, place, and time. He appears well-developed and well-nourished. He appears distressed.  HENT:  Head: Atraumatic.  Mouth/Throat: Oropharynx is clear and moist.  Eyes: Conjunctivae and EOM are normal. Pupils are equal, round, and reactive to light.  Neck: Normal range of motion.  Cardiovascular: Normal rate, regular rhythm and normal heart sounds.   No murmur heard. Pulmonary/Chest: Effort normal and breath sounds normal.  Abdominal: Soft. Bowel sounds are normal. There is no tenderness.  Mass in suprapubic area palpable suggestive of bladder.  Musculoskeletal: Normal range of motion.  Neurological: He is  alert and oriented to person, place, and time. No cranial nerve deficit. He exhibits normal muscle tone. Coordination normal.  Skin: Skin is warm.  Nursing note and vitals reviewed.   ED Course  Procedures (including critical care time) Labs Review Labs Reviewed  URINALYSIS, ROUTINE W REFLEX MICROSCOPIC (NOT AT [facility]) - Abnormal; Notable for the following:    Hgb urine dipstick SMALL (*)    All other components within normal limits  CBC WITH DIFFERENTIAL/PLATELET - Abnormal; Notable for the following:    WBC 10.8 (*)    Neutro Abs 9.1 (*)    All other components within normal limits  BASIC METABOLIC PANEL - Abnormal; Notable for the following:    Chloride 100 (*)    Glucose, Bld 103 (*)    BUN 22 (*)    All other components within normal limits  URINE MICROSCOPIC-ADD ON - Abnormal; Notable for the  following:    Squamous Epithelial / LPF 0-5 (*)    Bacteria, UA RARE (*)    All other components within normal limits  URINE CULTURE   Results for orders placed or performed during the hospital encounter of 04/29/15  Urinalysis, Routine w reflex microscopic (not at [facility])  Result Value Ref Range   Color, Urine YELLOW YELLOW   APPearance CLEAR CLEAR   Specific Gravity, Urine 1.025 1.005 - 1.030   pH 6.0 5.0 - 8.0   Glucose, UA NEGATIVE NEGATIVE mg/dL   Hgb urine dipstick SMALL (A) NEGATIVE   Bilirubin Urine NEGATIVE NEGATIVE   Ketones, ur NEGATIVE NEGATIVE mg/dL   Protein, ur NEGATIVE NEGATIVE mg/dL   Nitrite NEGATIVE NEGATIVE   Leukocytes, UA NEGATIVE NEGATIVE  CBC with Differential/Platelet  Result Value Ref Range   WBC 10.8 (H) 4.0 - 10.5 K/uL   RBC 4.69 4.22 - 5.81 MIL/uL   Hemoglobin 15.5 13.0 - 17.0 g/dL   HCT 43.1 39.0 - 52.0 %   MCV 91.9 78.0 - 100.0 fL   MCH 33.0 26.0 - 34.0 pg   MCHC 36.0 30.0 - 36.0 g/dL   RDW 13.0 11.5 - 15.5 %   Platelets 173 150 - 400 K/uL   Neutrophils Relative % 83 %   Neutro Abs 9.1 (H) 1.7 - 7.7 K/uL   Lymphocytes Relative 9 %   Lymphs  Abs 1.0 0.7 - 4.0 K/uL   Monocytes Relative 7 %   Monocytes Absolute 0.7 0.1 - 1.0 K/uL   Eosinophils Relative 1 %   Eosinophils Absolute 0.1 0.0 - 0.7 K/uL   Basophils Relative 0 %   Basophils Absolute 0.0 0.0 - 0.1 K/uL  Basic metabolic panel  Result Value Ref Range   Sodium 135 135 - 145 mmol/L   Potassium 4.6 3.5 - 5.1 mmol/L   Chloride 100 (L) 101 - 111 mmol/L   CO2 27 22 - 32 mmol/L   Glucose, Bld 103 (H) 65 - 99 mg/dL   BUN 22 (H) 6 - 20 mg/dL   Creatinine, Ser 0.99 0.61 - 1.24 mg/dL   Calcium 9.4 8.9 - 10.3 mg/dL   GFR calc non Af Amer >60 >60 mL/min   GFR calc Af Amer >60 >60 mL/min   Anion gap 8 5 - 15  Urine microscopic-add on  Result Value Ref Range   Squamous Epithelial / LPF 0-5 (A) NONE SEEN   WBC, UA 0-5 0 - 5 WBC/hpf   RBC / HPF 6-30 0 - 5 RBC/hpf   Bacteria, UA RARE (A) NONE SEEN     Imaging Review No results found. I have personally reviewed and evaluated these images and lab results as part of my medical decision-making.   EKG Interpretation None      MDM   Final diagnoses:  Urinary retention    Patient with significant urinary retention. Patient's distal penis with 2 urethral openings. We'll have patient follow-up with urology for the urinary retention as well as the urethral abnormality. Patient's urinalysis not consistent with urinary tract infection culture sent. Patient's renal function without significant abnormality slight increase in BUN. Patient's had greater than 800 mL of urine out. Patient did have some difficulties with bladder spasm. Treated here with hydromorphone IM and some Pyridium. Prescription Pyridium to go home. Patient understands the importance of following up with urology.    Fredia Sorrow, MD 04/29/15 2142

## 2015-04-29 NOTE — ED Notes (Signed)
Some difficulty with inserting foley initially due to 2 small urethral openings at tip of penis. Initially inserted in/out cath 86fr. Drained 1075ml. Later replaced with 32fr foley cath (unable to insert larger cath due to size of opening) and drained additional 500 ml clear yellow urine. Pt continues with bladder spasms and leakage around foley (MD aware) .   Teaching done re: home care and leg bag instructions.   Medicated with pyridium PO and dilaudid.

## 2015-04-29 NOTE — ED Notes (Signed)
Pt states that he has had urinary retention as a side effect from medication before and his doctor gave him a new med for arthritis and has not been able to urinate all day.

## 2015-04-29 NOTE — Discharge Instructions (Signed)
Acute Urinary Retention, Male Acute urinary retention is when you are unable to pee (urinate). Acute urinary retention is common in older men. Prostates can get bigger, which blocks the flow of pee.  HOME CARE  Drink enough fluids to keep your pee clear or pale yellow.  If you are sent home with a tube that drains the bladder (catheter), there will be a drainage bag attached to it. There are two types of bags. One is big that you can wear at night without having to empty it. One is smaller and needs to be emptied more often.  Keep the drainage bag empty.  Keep the drainage bag lower than your catheter.  Only take medicine as told by your doctor. GET HELP IF:  You have a low-grade fever.  You have spasms or you are leaking pee when you have spasms. GET HELP RIGHT AWAY IF:   You have chills or a fever.  Your catheter stops draining pee.  Your catheter falls out.  You have increased bleeding that does not stop after you have rested and increased the amount of fluids you had been drinking. MAKE SURE YOU:   Understand these instructions.  Will watch your condition.  Will get help right away if you are not doing well or get worse.   This information is not intended to replace advice given to you by your health care provider. Make sure you discuss any questions you have with your health care provider.  Keep the Foley catheter in place. Very important that you make an appointment to follow-up with urology. Information provided above. Return for any new or worse symptoms. Take the Pyridium as directed.  Empty the bag attached to the Foley when he gets full.   Document Released: 09/13/2007 Document Revised: 08/11/2014 Document Reviewed: 09/05/2012 Elsevier Interactive Patient Education [employer].

## 2015-05-01 ENCOUNTER — Emergency Department (HOSPITAL_COMMUNITY)
Admission: EM | Admit: 2015-05-01 | Discharge: 2015-05-01 | Disposition: A | Discharge status: Home / Self Care | Payer: Medicaid Other | Attending: Emergency Medicine | Admitting: Emergency Medicine

## 2015-05-01 ENCOUNTER — Encounter (HOSPITAL_COMMUNITY): Payer: Self-pay | Admitting: Emergency Medicine

## 2015-05-01 DIAGNOSIS — Z87891 Personal history of nicotine dependence: Secondary | ICD-10-CM | POA: Insufficient documentation

## 2015-05-01 DIAGNOSIS — F419 Anxiety disorder, unspecified: Secondary | ICD-10-CM | POA: Diagnosis not present

## 2015-05-01 DIAGNOSIS — Z79899 Other long term (current) drug therapy: Secondary | ICD-10-CM | POA: Diagnosis not present

## 2015-05-01 DIAGNOSIS — N410 Acute prostatitis: Secondary | ICD-10-CM | POA: Diagnosis not present

## 2015-05-01 DIAGNOSIS — I10 Essential (primary) hypertension: Secondary | ICD-10-CM | POA: Diagnosis not present

## 2015-05-01 DIAGNOSIS — K219 Gastro-esophageal reflux disease without esophagitis: Secondary | ICD-10-CM | POA: Insufficient documentation

## 2015-05-01 DIAGNOSIS — M545 Low back pain, unspecified: Secondary | ICD-10-CM

## 2015-05-01 DIAGNOSIS — R1030 Lower abdominal pain, unspecified: Secondary | ICD-10-CM

## 2015-05-01 LAB — CBC
HCT: 43.7 % (ref 39.0–52.0)
Hemoglobin: 15.8 g/dL (ref 13.0–17.0)
MCH: 33.4 pg (ref 26.0–34.0)
MCHC: 36.2 g/dL — ABNORMAL HIGH (ref 30.0–36.0)
MCV: 92.4 fL (ref 78.0–100.0)
Platelets: 184 10*3/uL (ref 150–400)
RBC: 4.73 MIL/uL (ref 4.22–5.81)
RDW: 13.2 % (ref 11.5–15.5)
WBC: 7.7 10*3/uL (ref 4.0–10.5)

## 2015-05-01 LAB — URINE MICROSCOPIC-ADD ON

## 2015-05-01 LAB — URINALYSIS, ROUTINE W REFLEX MICROSCOPIC
Bilirubin Urine: NEGATIVE
Glucose, UA: 250 mg/dL — AB
Hgb urine dipstick: NEGATIVE
Ketones, ur: NEGATIVE mg/dL
Nitrite: POSITIVE — AB
Protein, ur: 30 mg/dL — AB
Specific Gravity, Urine: 1.01 (ref 1.005–1.030)
pH: 8 (ref 5.0–8.0)

## 2015-05-01 LAB — COMPREHENSIVE METABOLIC PANEL
ALT: 161 U/L — ABNORMAL HIGH (ref 17–63)
AST: 71 U/L — ABNORMAL HIGH (ref 15–41)
Albumin: 4 g/dL (ref 3.5–5.0)
Alkaline Phosphatase: 40 U/L (ref 38–126)
Anion gap: 9 (ref 5–15)
BUN: 15 mg/dL (ref 6–20)
CO2: 28 mmol/L (ref 22–32)
Calcium: 9.5 mg/dL (ref 8.9–10.3)
Chloride: 101 mmol/L (ref 101–111)
Creatinine, Ser: 1.02 mg/dL (ref 0.61–1.24)
GFR calc Af Amer: >60 mL/min (ref >60)
GFR calc non Af Amer: >60 mL/min (ref >60)
Glucose, Bld: 90 mg/dL (ref 65–99)
Potassium: 4.2 mmol/L (ref 3.5–5.1)
Sodium: 138 mmol/L (ref 135–145)
Total Bilirubin: 1.8 mg/dL — ABNORMAL HIGH (ref 0.3–1.2)
Total Protein: 7.6 g/dL (ref 6.5–8.1)

## 2015-05-01 LAB — URINE CULTURE
Culture: NO GROWTH
Special Requests: NORMAL

## 2015-05-01 LAB — I-STAT CG4 LACTIC ACID, ED: Lactic Acid, Venous: 1.39 mmol/L (ref 0.5–2.0)

## 2015-05-01 MED ORDER — ONDANSETRON 4 MG PO TBDP
4.0000 mg | ORAL_TABLET | Freq: Once | ORAL | Status: AC
  Administered 2015-05-01: 4 mg via ORAL
  Filled 2015-05-01: qty 1

## 2015-05-01 MED ORDER — OXYCODONE-ACETAMINOPHEN 5-325 MG PO TABS
1.0000 | ORAL_TABLET | Freq: Once | ORAL | Status: AC
Start: 2015-05-01 — End: 2015-05-01
  Administered 2015-05-01: 1 via ORAL
  Filled 2015-05-01: qty 1

## 2015-05-01 MED ORDER — OXYCODONE-ACETAMINOPHEN 5-325 MG PO TABS: 2.0000 | ORAL_TABLET | Freq: Three times a day (TID) | ORAL | Status: DC | PRN

## 2015-05-01 NOTE — ED Notes (Signed)
MD updating patient on results and plan of care.

## 2015-05-01 NOTE — ED Notes (Signed)
Family updated with results and delay explained.

## 2015-05-01 NOTE — ED Provider Notes (Signed)
CSN: [id number]     Arrival date & time 05/01/15  1443 History   First MD Initiated Contact with Patient 05/01/15 1804     Chief Complaint  Patient presents with  . Back Pain     (Consider location/radiation/quality/duration/timing/severity/associated sxs/prior Treatment) Patient is a 45 y.o. male presenting with back pain. The history is provided by the patient.  Back Pain Location:  Lumbar spine Quality:  Aching Radiates to:  Does not radiate Pain severity:  Moderate Pain is:  Same all the time Onset quality:  Gradual Duration: acute on chronic for 3 days. Timing:  Constant Progression:  Worsening Chronicity:  Chronic Context comment:  Hx of lower back surgery Relieved by:  Nothing Worsened by:  Movement Ineffective treatments:  None tried Associated symptoms: abdominal pain   Associated symptoms: no chest pain, no fever, no leg pain, no numbness, no paresthesias, no perianal numbness and no tingling     Past Medical History  Diagnosis Date  . Anxiety   . Hypertension     "Dr Karie Kirks took me off meds"  diet control  . GERD (gastroesophageal reflux disease)     occ   Past Surgical History  Procedure Laterality Date  . Right knee  orif right patella Keeling 1993  . Knee surgery    . Left elbow    . Right foot      forgein body removal  . Elbow surgery Left   . Foot surgery    . Carpal tunnel release Right 06/25/2013    Procedure: CARPAL TUNNEL RELEASE;  Surgeon: Carole Civil, MD;  Location: [facility];  Service: Orthopedics;  Laterality: Right;  . Hernia repair Right     inguinal- age 45  . Carpal tunnel release Left 07/25/2013    Procedure: LEFT CARPAL TUNNEL RELEASE;  Surgeon: Carole Civil, MD;  Location: [facility];  Service: Orthopedics;  Laterality: Left;  . Septoplasty    . Lumbar laminectomy/decompression microdiscectomy Left 10/06/2013    Procedure: Left Lumbar Three-four microdiskectomy;  Surgeon: Ophelia Charter, MD;  Location: [place] [facility];   Service: Neurosurgery;  Laterality: Left;  Left Lumbar Three-four microdiskectomy  . Back surgery     Family History  Problem Relation Age of Onset  . Heart disease    . Arthritis    . Cancer    . Asthma    . Diabetes    . Kidney disease     Social History  Substance Use Topics  . Smoking status: Former Smoker -- 1.00 packs/day for 15 years    Types: Cigarettes    Quit date: 12/21/2012  . Smokeless tobacco: None  . Alcohol Use: Yes     Comment: occ  6-8 months last    Review of Systems  Constitutional: Negative for fever and chills.  HENT: Negative.   Eyes: Negative for visual disturbance.  Respiratory: Negative for cough and shortness of breath.   Cardiovascular: Negative for chest pain.  Gastrointestinal: Positive for abdominal pain. Negative for nausea, vomiting and diarrhea.  Genitourinary: Negative.   Musculoskeletal: Positive for back pain.  Skin: Negative for pallor, rash and wound.  Neurological: Negative for tingling, numbness and paresthesias.      Allergies  Review of patient's allergies indicates no known allergies.  Home Medications   Prior to Admission medications   Medication Sig Start Date End Date Taking? Authorizing Provider  clonazePAM (KLONOPIN) 1 MG tablet Take 1 mg by mouth 3 (three) times daily.    Historical Provider,  MD  ibuprofen (ADVIL,MOTRIN) 200 MG tablet Take 400 mg by mouth every 6 (six) hours as needed for moderate pain.    Historical Provider, MD  ibuprofen (ADVIL,MOTRIN) 800 MG tablet Take 800 mg by mouth 3 (three) times daily as needed. pain 04/24/15   Historical Provider, MD  ketorolac (TORADOL) 10 MG tablet Take 1 tablet (10 mg total) by mouth every 6 (six) hours as needed. With food Patient not taking: Reported on 04/29/2015 09/24/14   Tammy Triplett, PA-C  methocarbamol (ROBAXIN) 500 MG tablet Take 1 tablet (500 mg total) by mouth 3 (three) times daily. Patient not taking: Reported on 04/29/2015 09/24/14   Tammy Triplett, PA-C   naproxen (NAPROSYN) 500 MG tablet Take 500 mg by mouth 2 (two) times daily as needed. Pain or arthritis 01/31/15   Historical Provider, MD  oxyCODONE-acetaminophen (PERCOCET/ROXICET) 5-325 MG tablet Take 2 tablets by mouth every 8 (eight) hours as needed for severe pain. 05/01/15   Heriberto Antigua, MD  penicillin v potassium (VEETID) 500 MG tablet Take 500 mg by mouth 4 (four) times daily. Until gone (started 04/24/15) 04/24/15   Historical Provider, MD  phenazopyridine (PYRIDIUM) 200 MG tablet Take 1 tablet (200 mg total) by mouth 3 (three) times daily. 04/29/15   Fredia Sorrow, MD  predniSONE (STERAPRED UNI-PAK 21 TAB) 10 MG (21) TBPK tablet 6,5,4,3,2,1 taper Patient not taking: Reported on 04/29/2015 09/28/14   Fransico Meadow, PA-C  traMADol (ULTRAM) 50 MG tablet Take 100 mg by mouth 3 (three) times daily. 04/22/15   Historical Provider, MD  zolpidem (AMBIEN) 5 MG tablet Take 5 mg by mouth at bedtime as needed for sleep.    Historical Provider, MD   BP 122/85 mmHg  Pulse 61  Temp(Src) 98.4 F (36.9 C) (Oral)  Resp 17  SpO2 97% Physical Exam  Constitutional: He is oriented to person, place, and time. He appears well-developed and well-nourished. No distress.  HENT:  Head: Normocephalic and atraumatic.  Mouth/Throat: No oropharyngeal exudate.  Eyes: Pupils are equal, round, and reactive to light. No scleral icterus.  Neck: Normal range of motion. Neck supple.  Cardiovascular: Normal rate, regular rhythm, normal heart sounds and intact distal pulses.  Exam reveals no gallop and no friction rub.   No murmur heard. Pulmonary/Chest: Effort normal and breath sounds normal. No respiratory distress. He has no wheezes. He has no rales. He exhibits no tenderness.  Abdominal: Soft. He exhibits no distension. There is tenderness (suprapubic but improving per patient). There is no rebound and no guarding.  Genitourinary: Rectum normal.  Indwelling foley in place. Prostate tender. No perineal numbness   Musculoskeletal: Normal range of motion. He exhibits no edema or tenderness.  Diffuse lower back ttp. No sciatic  Neurological: He is alert and oriented to person, place, and time. No cranial nerve deficit. He exhibits normal muscle tone. Coordination normal.  Skin: Skin is warm and dry. No rash noted. He is not diaphoretic. No erythema. No pallor.  Psychiatric: He has a normal mood and affect.  Nursing note and vitals reviewed.   ED Course  Procedures (including critical care time) Labs Review Labs Reviewed  COMPREHENSIVE METABOLIC PANEL - Abnormal; Notable for the following:    AST 71 (*)    ALT 161 (*)    Total Bilirubin 1.8 (*)    All other components within normal limits  CBC - Abnormal; Notable for the following:    MCHC 36.2 (*)    All other components within normal limits  URINALYSIS,  ROUTINE W REFLEX MICROSCOPIC (NOT AT [facility]) - Abnormal; Notable for the following:    Glucose, UA 250 (*)    Protein, ur 30 (*)    Nitrite POSITIVE (*)    Leukocytes, UA TRACE (*)    All other components within normal limits  URINE MICROSCOPIC-ADD ON - Abnormal; Notable for the following:    Squamous Epithelial / LPF 0-5 (*)    Bacteria, UA RARE (*)    All other components within normal limits  GRAM STAIN  URINE CULTURE  I-STAT CG4 LACTIC ACID, ED    Imaging Review No results found. I have personally reviewed and evaluated these images and lab results as part of my medical decision-making.   EKG Interpretation None      MDM   Final diagnoses:  Acute prostatitis  Bilateral low back pain without sciatica  Suprapubic abdominal pain, unspecified laterality    Patient's 45 year old male with a recently placed Foley for urinary retention who presents with lower back pain as well as suprapubic abdominal pain that has been going on for the past 3 days. He states that he was recently placed on levofloxacin for prostatitis by his PCP yesterday. He otherwise denies any fevers or chest  pain, shortness of breath, or diarrhea. Further history and exam as above notable for stable vital signs, mild tenderness suprapubic abdomen, diffuse lower back tenderness to palpation and rectal exam with tender prostate.  Back pain likely chronic in nature. No red flag symptoms such as sciatica, le weakness/numbness/ tingling, or perineal numbness.  Likely prostatitis in setting of indwelling foley.   I have reviewed all labs and workup. Patient stable for discharge home.  I have reviewed all results with the patient. Advised to cont levofloxacin as rx and to f/u with urology in 2-3. Patient agrees to stated plan. All questions answered. Advised to call or return to have any questions, new symptoms, change in symptoms, or symptoms that they do not understand.    Heriberto Antigua, MD 05/02/15 [id number]  Forde Dandy, MD 05/02/15 [phone]

## 2015-05-01 NOTE — ED Notes (Signed)
Pt sts had urinary cath placed on Thursday and having back and groin pain

## 2015-05-01 NOTE — Discharge Instructions (Signed)
Prostatitis °Prostatitis is redness, soreness, and puffiness (swelling) of the prostate gland. The prostate gland is the walnut-sized gland located just below your bladder. °HOME CARE:  °· Take all medicines as told by your doctor. °· Take warm-water baths (sitz baths) as told by your doctor. °GET HELP IF: °· Your symptoms get worse, not better. °· You have a fever. °GET HELP RIGHT AWAY IF:  °· You have chills. °· You feel sick to your stomach (nauseous) or like you will throw up (vomit). °· You feel lightheaded or like you will pass out (faint). °· You are unable to pee (urinate). °· You have blood or blood clumps (clots) in your pee (urine). °MAKE SURE YOU: °· Understand these instructions. °· Will watch your condition. °· Will get help right away if you are not doing well or get worse. °  °This information is not intended to replace advice given to you by your health care provider. Make sure you discuss any questions you have with your health care provider. °  °Document Released: 09/26/2011 Document Revised: 04/17/2014 Document Reviewed: 10/14/2012 °Elsevier Interactive Patient Education ©2016 [employer] ° °

## 2015-05-02 LAB — GRAM STAIN: Special Requests: NORMAL

## 2015-05-03 LAB — URINE CULTURE
Culture: NO GROWTH
Special Requests: NORMAL

## 2015-05-17 ENCOUNTER — Emergency Department (HOSPITAL_COMMUNITY): Payer: Medicaid Other

## 2015-05-17 ENCOUNTER — Encounter (HOSPITAL_COMMUNITY): Payer: Self-pay | Admitting: *Deleted

## 2015-05-17 ENCOUNTER — Emergency Department (HOSPITAL_COMMUNITY)
Admission: EM | Admit: 2015-05-17 | Discharge: 2015-05-17 | Disposition: A | Discharge status: Home / Self Care | Payer: Medicaid Other | Attending: Emergency Medicine | Admitting: Emergency Medicine

## 2015-05-17 DIAGNOSIS — Z791 Long term (current) use of non-steroidal anti-inflammatories (NSAID): Secondary | ICD-10-CM | POA: Insufficient documentation

## 2015-05-17 DIAGNOSIS — R109 Unspecified abdominal pain: Secondary | ICD-10-CM | POA: Insufficient documentation

## 2015-05-17 DIAGNOSIS — Z8719 Personal history of other diseases of the digestive system: Secondary | ICD-10-CM | POA: Insufficient documentation

## 2015-05-17 DIAGNOSIS — I10 Essential (primary) hypertension: Secondary | ICD-10-CM | POA: Insufficient documentation

## 2015-05-17 DIAGNOSIS — Z9889 Other specified postprocedural states: Secondary | ICD-10-CM | POA: Insufficient documentation

## 2015-05-17 DIAGNOSIS — Z79899 Other long term (current) drug therapy: Secondary | ICD-10-CM | POA: Insufficient documentation

## 2015-05-17 DIAGNOSIS — M545 Low back pain: Secondary | ICD-10-CM | POA: Diagnosis present

## 2015-05-17 DIAGNOSIS — M6281 Muscle weakness (generalized): Secondary | ICD-10-CM | POA: Diagnosis not present

## 2015-05-17 DIAGNOSIS — F419 Anxiety disorder, unspecified: Secondary | ICD-10-CM | POA: Insufficient documentation

## 2015-05-17 DIAGNOSIS — M5416 Radiculopathy, lumbar region: Secondary | ICD-10-CM | POA: Insufficient documentation

## 2015-05-17 DIAGNOSIS — Z87438 Personal history of other diseases of male genital organs: Secondary | ICD-10-CM | POA: Insufficient documentation

## 2015-05-17 DIAGNOSIS — R29898 Other symptoms and signs involving the musculoskeletal system: Secondary | ICD-10-CM

## 2015-05-17 DIAGNOSIS — R34 Anuria and oliguria: Secondary | ICD-10-CM | POA: Insufficient documentation

## 2015-05-17 DIAGNOSIS — Z87891 Personal history of nicotine dependence: Secondary | ICD-10-CM | POA: Diagnosis not present

## 2015-05-17 LAB — URINALYSIS, ROUTINE W REFLEX MICROSCOPIC
Bilirubin Urine: NEGATIVE
Glucose, UA: NEGATIVE mg/dL
Hgb urine dipstick: NEGATIVE
Ketones, ur: NEGATIVE mg/dL
Leukocytes, UA: NEGATIVE
Nitrite: NEGATIVE
Protein, ur: NEGATIVE mg/dL
Specific Gravity, Urine: >1.03 — ABNORMAL HIGH (ref 1.005–1.030)
pH: 6 (ref 5.0–8.0)

## 2015-05-17 MED ORDER — IBUPROFEN 800 MG PO TABS: 800.0000 mg | ORAL_TABLET | Freq: Three times a day (TID) | ORAL | Status: DC

## 2015-05-17 MED ORDER — METHYLPREDNISOLONE 4 MG PO TBPK: ORAL_TABLET | ORAL | Status: DC

## 2015-05-17 MED ORDER — OXYCODONE-ACETAMINOPHEN 5-325 MG PO TABS
2.0000 | ORAL_TABLET | Freq: Once | ORAL | Status: AC
  Administered 2015-05-17: 2 via ORAL
  Filled 2015-05-17: qty 2

## 2015-05-17 MED ORDER — OXYCODONE-ACETAMINOPHEN 5-325 MG PO TABS: 1.0000 | ORAL_TABLET | ORAL | Status: DC | PRN

## 2015-05-17 MED ORDER — IBUPROFEN 800 MG PO TABS
800.0000 mg | ORAL_TABLET | Freq: Once | ORAL | Status: AC
  Administered 2015-05-17: 800 mg via ORAL
  Filled 2015-05-17: qty 1

## 2015-05-17 NOTE — ED Notes (Signed)
Pt also complaining of left elbow pain, fell last week 4/10 pain

## 2015-05-17 NOTE — ED Notes (Signed)
Pt reports leg swelling x 6 months. Now c/o leg pain in thigh x 4 days.

## 2015-05-17 NOTE — ED Notes (Signed)
Pt has tenderness to low back

## 2015-05-17 NOTE — ED Provider Notes (Signed)
CSN: [id number]     Arrival date & time 05/17/15  1256 History   First MD Initiated Contact with Patient 05/17/15 1637     Chief Complaint  Patient presents with  . Leg Swelling     (Consider location/radiation/quality/duration/timing/severity/associated sxs/prior Treatment) HPI Comments: Patient reports "I think I injured my back again". States he's had increase in his lower back pain really know his left leg for the past several days. History of surgery by Dr. Arnoldo Morale last year. States pain radiates from her left buttock down left leg stopping at knee associated with numbness. Endorses some weakness in the left leg as well. No bowel or bladder incontinence. No fever or vomiting. Notably patient had Foley catheter removed 3 days ago for urinary retention and prostatitis. He is currently taking Levaquin for history of prostatitis. States he is having no problems with urination at this time. Denies any chest pain or shortness of breath. No abdominal pain. His been taking tramadol for the pain without relief.  The history is provided by the patient.    Past Medical History  Diagnosis Date  . Anxiety   . Hypertension     "Dr Karie Kirks took me off meds"  diet control  . GERD (gastroesophageal reflux disease)     occ   Past Surgical History  Procedure Laterality Date  . Right knee  orif right patella [place] 1993  . Knee surgery    . Left elbow    . Right foot      forgein body removal  . Elbow surgery Left   . Foot surgery    . Carpal tunnel release Right 06/25/2013    Procedure: CARPAL TUNNEL RELEASE;  Surgeon: Carole Civil, MD;  Location: [facility];  Service: Orthopedics;  Laterality: Right;  . Hernia repair Right     inguinal- age 45  . Carpal tunnel release Left 07/25/2013    Procedure: LEFT CARPAL TUNNEL RELEASE;  Surgeon: Carole Civil, MD;  Location: [facility];  Service: Orthopedics;  Laterality: Left;  . Septoplasty    . Lumbar laminectomy/decompression microdiscectomy Left  10/06/2013    Procedure: Left Lumbar Three-four microdiskectomy;  Surgeon: Ophelia Charter, MD;  Location: [facility];  Service: Neurosurgery;  Laterality: Left;  Left Lumbar Three-four microdiskectomy  . Back surgery     Family History  Problem Relation Age of Onset  . Heart disease    . Arthritis    . Cancer    . Asthma    . Diabetes    . Kidney disease     Social History  Substance Use Topics  . Smoking status: Former Smoker -- 1.00 packs/day for 15 years    Types: Cigarettes    Quit date: 12/21/2012  . Smokeless tobacco: None  . Alcohol Use: Yes     Comment: occ  6-8 months last    Review of Systems  Constitutional: Negative for fever, activity change and appetite change.  Respiratory: Negative for cough, chest tightness and shortness of breath.   Cardiovascular: Negative for chest pain.  Gastrointestinal: Negative for nausea, vomiting and abdominal pain.  Genitourinary: Positive for flank pain and decreased urine volume. Negative for dysuria, urgency, hematuria and testicular pain.  Musculoskeletal: Positive for myalgias, back pain and arthralgias. Negative for gait problem.  Skin: Negative for wound.  Neurological: Negative for dizziness, weakness and headaches.  A complete 10 system review of systems was obtained and all systems are negative except as noted in the HPI and PMH.  Allergies  Review of patient's allergies indicates no known allergies.  Home Medications   Prior to Admission medications   Medication Sig Start Date End Date Taking? Authorizing Provider  clonazePAM (KLONOPIN) 1 MG tablet Take 1 mg by mouth 3 (three) times daily.   Yes Historical Provider, MD  phenazopyridine (PYRIDIUM) 200 MG tablet Take 1 tablet (200 mg total) by mouth 3 (three) times daily. 04/29/15  Yes Fredia Sorrow, MD  tamsulosin (FLOMAX) 0.4 MG CAPS capsule Take 1 capsule by mouth daily. 05/05/15  Yes Historical Provider, MD  zolpidem (AMBIEN) 5 MG tablet Take 5-10 mg by  mouth at bedtime as needed for sleep.    Yes Historical Provider, MD  ibuprofen (ADVIL,MOTRIN) 800 MG tablet Take 1 tablet (800 mg total) by mouth 3 (three) times daily. 05/17/15   Ezequiel Essex, MD  ibuprofen (ADVIL,MOTRIN) 800 MG tablet Take 1 tablet (800 mg total) by mouth 3 (three) times daily. 05/17/15   Ezequiel Essex, MD  methylPREDNISolone (MEDROL DOSEPAK) 4 MG TBPK tablet As directed 05/17/15   Ezequiel Essex, MD  predniSONE (STERAPRED UNI-PAK 21 TAB) 10 MG (21) TBPK tablet 6,5,4,3,2,1 taper Patient not taking: Reported on 04/29/2015 09/28/14   Fransico Meadow, PA-C  traMADol (ULTRAM) 50 MG tablet Take 100 mg by mouth 3 (three) times daily. 04/22/15   Historical Provider, MD   BP 145/94 mmHg  Pulse 67  Temp(Src) 97.8 F (36.6 C) (Oral)  Resp 14  Ht 5\' 11"  (1.803 m)  Wt 228 lb (103.42 kg)  BMI 31.81 kg/m2  SpO2 98% Physical Exam  Constitutional: He is oriented to person, place, and time. He appears well-developed and well-nourished. No distress.  HENT:  Head: Normocephalic and atraumatic.  Mouth/Throat: Oropharynx is clear and moist. No oropharyngeal exudate.  Eyes: Conjunctivae and EOM are normal. Pupils are equal, round, and reactive to light.  Neck: Normal range of motion. Neck supple.  No meningismus.  Cardiovascular: Normal rate, regular rhythm, normal heart sounds and intact distal pulses.   No murmur heard. Pulmonary/Chest: Effort normal and breath sounds normal. No respiratory distress.  Abdominal: Soft. There is no tenderness. There is no rebound and no guarding.  Genitourinary:  Normal rectal tone. Prostate nontender, nonboggy  Musculoskeletal: Normal range of motion. He exhibits tenderness. He exhibits no edema.  TTP L paraspinal lumbar.  Decreased strength in left leg compared to right. Ankle flexion and extension intact bilaterally, 4/5 on left, 5/5 in right. Great toe extension intact bilaterally. Intact DP and PT pulses. +2 patellar reflexes bilaterally   Neurological: He is alert and oriented to person, place, and time. No cranial nerve deficit. He exhibits normal muscle tone. Coordination normal.  No ataxia on finger to nose bilaterally. No pronator drift. . CN 2-12 intact.Equal grip strength. Sensation intact.   Skin: Skin is warm.  Psychiatric: He has a normal mood and affect. His behavior is normal.  Nursing note and vitals reviewed.   ED Course  Procedures (including critical care time) Labs Review Labs Reviewed  URINALYSIS, ROUTINE W REFLEX MICROSCOPIC (NOT AT [facility]) - Abnormal; Notable for the following:    Specific Gravity, Urine >1.030 (*)    All other components within normal limits    Imaging Review Dg Elbow Complete Left  05/17/2015  CLINICAL DATA:  Fall on left elbow last week.  Pain. EXAM: LEFT ELBOW - COMPLETE 3+ VIEW COMPARISON:  None. FINDINGS: No acute bony abnormality. Specifically, no fracture, subluxation, or dislocation. Soft tissues are intact. No joint effusion. IMPRESSION: No  acute bony abnormality. Electronically Signed   By: Rolm Baptise M.D.   On: 05/17/2015 18:05   Mr Lumbar Spine Wo Contrast  05/17/2015  CLINICAL DATA:  Low back pain. Lower extremity weakness. Previous surgery last year. EXAM: MRI LUMBAR SPINE WITHOUT CONTRAST TECHNIQUE: Multiplanar, multisequence MR imaging of the lumbar spine was performed. No intravenous contrast was administered. COMPARISON:  09/28/2014 FINDINGS: There is no significant finding at T11-12, T12-L1 or L1-2. No stenosis. The distal cord and conus are normal. L2-3:  Minimal desiccation and bulging of the disc.  No stenosis. L3-4: Previous partial left hemilaminectomy at this level. Retrolisthesis of 2 mm. Desiccation and mild bulging of the disc. Mild narrowing of both lateral recesses without visible neural compression. L4-5: Disc degeneration with shallow broad-based herniation of disc material more towards the right. This indents the thecal sac. There is mild stenosis of the  lateral recesses and foramina, right more than left. Neural compression could possibly occur on the right. There is facet edema at this level, right more than left, that could contribute to back pain. This has worsened since the previous study. L5-S1: Normal appearance of the disc. Mild facet degeneration. No canal or foraminal stenosis. Compared to the previous study, no change is appreciated. IMPRESSION: Previous partial left hemilaminectomy at L3-4. No residual or recurrent disc herniation. Broad-based disc herniation at L4-5 more prominent towards the right with narrowing of the lateral recesses and foramina right more than left. No significant change since the last study. Facet arthropathy is slightly worsened and now shows edema and could be associated with back pain. Electronically Signed   By: Nelson Chimes M.D.   On: 05/17/2015 17:36   US Venous Img Lower Unilateral Left  05/17/2015  CLINICAL DATA:  Left lower extremity edema for the past 6 months. History of injury involving the left lower extremity. History of smoking and varicose veins. Evaluate for DVT EXAM: LEFT LOWER EXTREMITY VENOUS DOPPLER ULTRASOUND TECHNIQUE: Gray-scale sonography with graded compression, as well as color Doppler and duplex ultrasound were performed to evaluate the lower extremity deep venous systems from the level of the common femoral vein and including the common femoral, femoral, profunda femoral, popliteal and calf veins including the posterior tibial, peroneal and gastrocnemius veins when visible. The superficial great saphenous vein was also interrogated. Spectral Doppler was utilized to evaluate flow at rest and with distal augmentation maneuvers in the common femoral, femoral and popliteal veins. COMPARISON:  None. FINDINGS: Contralateral Common Femoral Vein: Respiratory phasicity is normal and symmetric with the symptomatic side. No evidence of thrombus. Normal compressibility. Common Femoral Vein: No evidence of  thrombus. Normal compressibility, respiratory phasicity and response to augmentation. Saphenofemoral Junction: No evidence of thrombus. Normal compressibility and flow on color Doppler imaging. Profunda Femoral Vein: No evidence of thrombus. Normal compressibility and flow on color Doppler imaging. Femoral Vein: No evidence of thrombus. Normal compressibility, respiratory phasicity and response to augmentation. Popliteal Vein: No evidence of thrombus. Normal compressibility, respiratory phasicity and response to augmentation. Calf Veins: No evidence of thrombus. Normal compressibility and flow on color Doppler imaging. Superficial Great Saphenous Vein: No evidence of thrombus. Normal compressibility and flow on color Doppler imaging. Venous Reflux:  None. Other Findings:  None. IMPRESSION: No evidence of DVT within the left lower extremity. Electronically Signed   By: Sandi Mariscal M.D.   On: 05/17/2015 17:40   I have personally reviewed and evaluated these images and lab results as part of my medical decision-making.   EKG Interpretation  None      MDM   Final diagnoses:  Left leg weakness  Lumbar radiculopathy   Worsening left leg pain and weakness over the past several days. History of lumbar surgery. Intact distal pulses. Recently removed Foley catheter for history of urinary retention. States no retention at this time.  Dopplers negative. MRI done and shows stable disc herniation without evidence of cauda equina or cord compression. Patient is able to ambulate.  Post void residual 14 ml. No urinary retention or need for foley catheter.  Tolerating PO and ambulatory.  Treat for sciatica with steroids and NSAIDs.  Narcotic database reviewed and patient with multiple recent narcotic prescriptions so no further narcotics will be provided.  Follow up with Dr. Arnoldo Morale. Return precautions discussed.  Ezequiel Essex, MD 05/18/15 [phone]

## 2015-05-17 NOTE — Discharge Instructions (Signed)
Sciatica Follow up with Jamie [name]. Return to the ED if you develop worsening pain, weakness, numbness, or any other concerns. Sciatica is pain, weakness, numbness, or tingling along the path of the sciatic nerve. The nerve starts in the lower back and runs down the back of each leg. The nerve controls the muscles in the lower leg and in the back of the knee, while also providing sensation to the back of the thigh, lower leg, and the sole of your foot. Sciatica is a symptom of another medical condition. For instance, nerve damage or certain conditions, such as a herniated disk or bone spur on the spine, pinch or put pressure on the sciatic nerve. This causes the pain, weakness, or other sensations normally associated with sciatica. Generally, sciatica only affects one side of the body. CAUSES   Herniated or slipped disc.  Degenerative disk disease.  A pain disorder involving the narrow muscle in the buttocks (piriformis syndrome).  Pelvic injury or fracture.  Pregnancy.  Tumor (rare). SYMPTOMS  Symptoms can vary from mild to very severe. The symptoms usually travel from the low back to the buttocks and down the back of the leg. Symptoms can include:  Mild tingling or dull aches in the lower back, leg, or hip.  Numbness in the back of the calf or sole of the foot.  Burning sensations in the lower back, leg, or hip.  Sharp pains in the lower back, leg, or hip.  Leg weakness.  Severe back pain inhibiting movement. These symptoms may get worse with coughing, sneezing, laughing, or prolonged sitting or standing. Also, being overweight may worsen symptoms. DIAGNOSIS  Your caregiver will perform a physical exam to look for common symptoms of sciatica. He or she may ask you to do certain movements or activities that would trigger sciatic nerve pain. Other tests may be performed to find the cause of the sciatica. These may include:  Blood tests.  X-rays.  Imaging tests, such as an MRI  or CT scan. TREATMENT  Treatment is directed at the cause of the sciatic pain. Sometimes, treatment is not necessary and the pain and discomfort goes away on its own. If treatment is needed, your caregiver may suggest:  Over-the-counter medicines to relieve pain.  Prescription medicines, such as anti-inflammatory medicine, muscle relaxants, or narcotics.  Applying heat or ice to the painful area.  Steroid injections to lessen pain, irritation, and inflammation around the nerve.  Reducing activity during periods of pain.  Exercising and stretching to strengthen your abdomen and improve flexibility of your spine. Your caregiver may suggest losing weight if the extra weight makes the back pain worse.  Physical therapy.  Surgery to eliminate what is pressing or pinching the nerve, such as a bone spur or part of a herniated disk. HOME CARE INSTRUCTIONS   Only take over-the-counter or prescription medicines for pain or discomfort as directed by your caregiver.  Apply ice to the affected area for 20 minutes, 3-4 times a day for the first 48-72 hours. Then try heat in the same way.  Exercise, stretch, or perform your usual activities if these do not aggravate your pain.  Attend physical therapy sessions as directed by your caregiver.  Keep all follow-up appointments as directed by your caregiver.  Do not wear high heels or shoes that do not provide proper support.  Check your mattress to see if it is too soft. A firm mattress may lessen your pain and discomfort. SEEK IMMEDIATE MEDICAL CARE IF:  You lose control of your bowel or bladder (incontinence).  You have increasing weakness in the lower back, pelvis, buttocks, or legs.  You have redness or swelling of your back.  You have a burning sensation when you urinate.  You have pain that gets worse when you lie down or awakens you at night.  Your pain is worse than you have experienced in the past.  Your pain is lasting longer  than 4 weeks.  You are suddenly losing weight without reason. MAKE SURE YOU:  Understand these instructions.  Will watch your condition.  Will get help right away if you are not doing well or get worse.   This information is not intended to replace advice given to you by your health care provider. Make sure you discuss any questions you have with your health care provider.   Document Released: 03/21/2001 Document Revised: 12/16/2014 Document Reviewed: 08/06/2011 Elsevier Interactive Patient Education [employer].

## 2015-05-17 NOTE — ED Notes (Signed)
Patient ambulatory to restroom with steady gait, clean catch instructions given and advised pt to bring specimen back to room as well.  State pain is worse when walking, relieved some in the bed.

## 2015-06-10 ENCOUNTER — Ambulatory Visit: Payer: Medicaid Other | Admitting: Orthopaedic Surgery

## 2015-06-14 ENCOUNTER — Ambulatory Visit (INDEPENDENT_AMBULATORY_CARE_PROVIDER_SITE_OTHER): Payer: Medicaid Other

## 2015-06-14 ENCOUNTER — Encounter: Payer: Self-pay | Admitting: Orthopedic Surgery

## 2015-06-14 ENCOUNTER — Ambulatory Visit: Payer: Medicaid Other

## 2015-06-14 ENCOUNTER — Ambulatory Visit (INDEPENDENT_AMBULATORY_CARE_PROVIDER_SITE_OTHER): Payer: Medicaid Other | Admitting: Orthopedic Surgery

## 2015-06-14 VITALS — BP 110/72 | Ht 71.0 in | Wt 228.0 lb

## 2015-06-14 DIAGNOSIS — M25561 Pain in right knee: Secondary | ICD-10-CM | POA: Diagnosis not present

## 2015-06-14 DIAGNOSIS — M25562 Pain in left knee: Secondary | ICD-10-CM | POA: Diagnosis not present

## 2015-06-14 MED ORDER — DICLOFENAC POTASSIUM 50 MG PO TABS: 50.0000 mg | ORAL_TABLET | Freq: Two times a day (BID) | ORAL | Status: DC

## 2015-06-14 NOTE — Progress Notes (Signed)
   Subjective:    Patient ID: Jamie [name], male    DOB: 06/13/1970, 45 y.o.   MRN: [id number]  Chief Complaint  Patient presents with  . Knee Pain    ER FOLLOW UP BILATERAL KNEE PAIN AND SWELLING    Knee Pain  The incident occurred more than 1 week ago (2 mos left knee). There was no injury mechanism. The pain is present in the left knee and right knee. The quality of the pain is described as aching. The pain is moderate. The pain has been worsening since onset. Associated symptoms include a loss of motion. The symptoms are aggravated by movement and weight bearing. He has tried NSAIDs (cane, ibuprofen) for the symptoms. The treatment provided no relief.    Status post open treatment internal fixation right patella fracture with peripatellar pain  Review of Systems  Constitutional: Negative for fever.  Musculoskeletal: Positive for back pain, joint swelling, arthralgias and gait problem.  Skin: Negative.        Objective:   Physical Exam  Constitutional: He is oriented to person, place, and time. He appears well-developed and well-nourished.  Cardiovascular: Intact distal pulses.   Neurological: He is alert and oriented to person, place, and time. He has normal reflexes. He displays normal reflexes. He exhibits normal muscle tone. Coordination normal.  Skin: Skin is warm and dry.  Psychiatric: He has a normal mood and affect. His behavior is normal. Judgment and thought content normal.   Right knee midline incision well healed no erythema. Crepitance joint lines nontender full range of motion knee stable strength normal distal pulses intact sensation normal  Left knee small joint effusion medial and lateral joint line tenderness flexion 1:30 knee stable strength normal skin intact sensation normal  X-ray #1 right knee 2 screws parapatellar arthritis X-ray #2 left knee medial compartment arthritis mild to moderate  Diagnosis osteoarthritis left knee Status post open treatment  internal fixation right patella stable mild arthritis  Plan inject left knee Switch from ibuprofen to diclofenac X-ray left knee again in 6 months  Procedure note left knee injection verbal consent was obtained to inject left knee joint  Timeout was completed to confirm the site of injection  The medications used were 40 mg of Depo-Medrol and 1% lidocaine 3 cc  Anesthesia was provided by ethyl chloride and the skin was prepped with alcohol.  After cleaning the skin with alcohol a 20-gauge needle was used to inject the left knee joint. There were no complications. A sterile bandage was applied.         Assessment & Plan:

## 2015-07-02 ENCOUNTER — Ambulatory Visit (INDEPENDENT_AMBULATORY_CARE_PROVIDER_SITE_OTHER): Payer: Medicaid Other | Admitting: Orthopedic Surgery

## 2015-07-02 ENCOUNTER — Encounter: Payer: Self-pay | Admitting: Orthopedic Surgery

## 2015-07-02 VITALS — BP 159/101 | Ht 71.0 in | Wt 243.0 lb

## 2015-07-02 DIAGNOSIS — M25561 Pain in right knee: Secondary | ICD-10-CM

## 2015-07-02 DIAGNOSIS — M25562 Pain in left knee: Secondary | ICD-10-CM | POA: Diagnosis not present

## 2015-07-02 NOTE — Patient Instructions (Signed)

## 2015-07-02 NOTE — Progress Notes (Signed)
Chief Complaint  Patient presents with  . Follow-up    follow up increased bilateral knee pain and swelling   HPI 45 year old male presents for reevaluation of his bilateral knee pain. Is actually having bilateral leg pain. I've seen him in the past and was found to have severe degenerative disc disease with referred pain to his knees his x-rays never showed any severe arthritis.  He did have some swelling in the left knee which was spontaneous without any trauma and it went away without any treatment.    His primary care doctor sent him here for reevaluation. He is scheduled to see neurosurgery next month I think it is or next week which is where he really needs to be.  Review of Systems  Constitutional: Negative for fever, chills and malaise/fatigue.  Musculoskeletal: Positive for back pain and falls.  Neurological: Positive for tingling and sensory change.    Past Medical History  Diagnosis Date  . Anxiety   . Hypertension     "Dr Karie Kirks took me off meds"  diet control  . GERD (gastroesophageal reflux disease)     occ    Past Surgical History  Procedure Laterality Date  . Right knee  orif right patella Keeling 1993  . Knee surgery    . Left elbow    . Right foot      forgein body removal  . Elbow surgery Left   . Foot surgery    . Carpal tunnel release Right 06/25/2013    Procedure: CARPAL TUNNEL RELEASE;  Surgeon: Carole Civil, MD;  Location: [facility];  Service: Orthopedics;  Laterality: Right;  . Hernia repair Right     inguinal- age 63  . Carpal tunnel release Left 07/25/2013    Procedure: LEFT CARPAL TUNNEL RELEASE;  Surgeon: Carole Civil, MD;  Location: [facility];  Service: Orthopedics;  Laterality: Left;  . Septoplasty    . Lumbar laminectomy/decompression microdiscectomy Left 10/06/2013    Procedure: Left Lumbar Three-four microdiskectomy;  Surgeon: Ophelia Charter, MD;  Location: [facility];  Service: Neurosurgery;  Laterality: Left;  Left Lumbar  Three-four microdiskectomy  . Back surgery     Family History  Problem Relation Age of Onset  . Heart disease    . Arthritis    . Cancer    . Asthma    . Diabetes    . Kidney disease     Social History  Substance Use Topics  . Smoking status: Former Smoker -- 1.00 packs/day for 15 years    Types: Cigarettes    Quit date: 12/21/2012  . Smokeless tobacco: None  . Alcohol Use: Yes     Comment: occ  6-8 months last    Current outpatient prescriptions:  .  clonazePAM (KLONOPIN) 1 MG tablet, Take 1 mg by mouth 3 (three) times daily., Disp: , Rfl:  .  diclofenac (CATAFLAM) 50 MG tablet, Take 1 tablet (50 mg total) by mouth 2 (two) times daily., Disp: 60 tablet, Rfl: 5 .  phenazopyridine (PYRIDIUM) 200 MG tablet, Take 1 tablet (200 mg total) by mouth 3 (three) times daily., Disp: 6 tablet, Rfl: 0 .  zolpidem (AMBIEN) 5 MG tablet, Take 5-10 mg by mouth at bedtime as needed for sleep. , Disp: , Rfl:   BP 159/101 mmHg  Ht 5\' 11"  (1.803 m)  Wt 243 lb (110.224 kg)  BMI 33.91 kg/m2  Physical Exam  Constitutional: He is oriented to person, place, and time. He appears well-developed and well-nourished.  No distress.  Cardiovascular: Normal rate and intact distal pulses.   Musculoskeletal:       Right knee: He exhibits no effusion.       Left knee: He exhibits no effusion.  Neurological: He is alert and oriented to person, place, and time.  Skin: Skin is warm and dry. No rash noted. He is not diaphoretic. No erythema. No pallor.  Psychiatric: He has a normal mood and affect. His behavior is normal. Judgment and thought content normal.    Right Knee Exam   Tenderness  The patient is experiencing tenderness in the medial joint line.  Muscle Strength   The patient has normal right knee strength.  Tests  Drawer:       Anterior - negative     Varus: negative Valgus: negative  Other  Erythema: absent Scars: present Pulse: present Swelling: none Other tests: no effusion  present   Left Knee Exam   Tenderness  The patient is experiencing tenderness in the medial joint line.  Muscle Strength   The patient has normal left knee strength.  Tests  Drawer:       Anterior - negative      Varus: negative Valgus: negative  Other  Erythema: absent Pulse: present Swelling: none Effusion: no effusion present     cane is required for walking   ASSESSMENT: My personal interpretation of the images:  Prior x-rays show mild joint space narrowing medial compartment without any secondary bone changes consistent with mild arthritis    PLAN  Procedure note left knee injection verbal consent was obtained to inject left knee joint  Timeout was completed to confirm the site of injection  The medications used were 40 mg of Depo-Medrol and 1% lidocaine 3 cc  Anesthesia was provided by ethyl chloride and the skin was prepped with alcohol.  After cleaning the skin with alcohol a 20-gauge needle was used to inject the left knee joint. There were no complications. A sterile bandage was applied.   Procedure note right knee injection verbal consent was obtained to inject right knee joint  Timeout was completed to confirm the site of injection  The medications used were 40 mg of Depo-Medrol and 1% lidocaine 3 cc  Anesthesia was provided by ethyl chloride and the skin was prepped with alcohol.  After cleaning the skin with alcohol a 20-gauge needle was used to inject the right knee joint. There were no complications. A sterile bandage was applied.  Refer back to his primary care doctor and needs to see neurosurgery

## 2015-12-20 ENCOUNTER — Ambulatory Visit (INDEPENDENT_AMBULATORY_CARE_PROVIDER_SITE_OTHER): Payer: Medicaid Other | Admitting: Orthopedic Surgery

## 2015-12-20 ENCOUNTER — Encounter: Payer: Self-pay | Admitting: Orthopedic Surgery

## 2015-12-20 ENCOUNTER — Ambulatory Visit (INDEPENDENT_AMBULATORY_CARE_PROVIDER_SITE_OTHER): Payer: Medicaid Other

## 2015-12-20 VITALS — BP 126/77 | HR 59 | Ht 73.0 in | Wt 247.0 lb

## 2015-12-20 DIAGNOSIS — M1710 Unilateral primary osteoarthritis, unspecified knee: Secondary | ICD-10-CM

## 2015-12-20 DIAGNOSIS — M25562 Pain in left knee: Secondary | ICD-10-CM

## 2015-12-20 DIAGNOSIS — M129 Arthropathy, unspecified: Secondary | ICD-10-CM

## 2015-12-20 MED ORDER — TRAMADOL HCL 50 MG PO TABS: 50.0000 mg | ORAL_TABLET | Freq: Four times a day (QID) | ORAL | 5 refills | Status: DC | PRN

## 2015-12-20 NOTE — Patient Instructions (Signed)
Joint Injection  Care After  Refer to this sheet in the next few days. These instructions provide you with information on caring for yourself after you have had a joint injection. Your caregiver also may give you more specific instructions. Your treatment has been planned according to current medical practices, but problems sometimes occur. Call your caregiver if you have any problems or questions after your procedure.  After any type of joint injection, it is not uncommon to experience:  · Soreness, swelling, or bruising around the injection site.  · Mild numbness, tingling, or weakness around the injection site caused by the numbing medicine used before or with the injection.  It also is possible to experience the following effects associated with the specific agent after injection:  · Iodine-based contrast agents:  ¨ Allergic reaction (itching, hives, widespread redness, and swelling beyond the injection site).  · Corticosteroids (These effects are rare.):  ¨ Allergic reaction.  ¨ Increased blood sugar levels (If you have diabetes and you notice that your blood sugar levels have increased, notify your caregiver).  ¨ Increased blood pressure levels.  ¨ Mood swings.  · Hyaluronic acid in the use of viscosupplementation.  ¨ Temporary heat or redness.  ¨ Temporary rash and itching.  ¨ Increased fluid accumulation in the injected joint.  These effects all should resolve within a day after your procedure.   HOME CARE INSTRUCTIONS  · Limit yourself to light activity the day of your procedure. Avoid lifting heavy objects, bending, stooping, or twisting.  · Take prescription or over-the-counter pain medication as directed by your caregiver.  · You may apply ice to your injection site to reduce pain and swelling the day of your procedure. Ice may be applied 03-04 times:  ¨ Put ice in a plastic bag.  ¨ Place a towel between your skin and the bag.  ¨ Leave the ice on for no longer than 15-20 minutes each time.  SEEK  IMMEDIATE MEDICAL CARE IF:   · Pain and swelling get worse rather than better or extend beyond the injection site.  · Numbness does not go away.  · Blood or fluid continues to leak from the injection site.  · You have chest pain.  · You have swelling of your face or tongue.  · You have trouble breathing or you become dizzy.  · You develop a fever, chills, or severe tenderness at the injection site that last longer than 1 day.  MAKE SURE YOU:  · Understand these instructions.  · Watch your condition.  · Get help right away if you are not doing well or if you get worse.  Document Released: 12/08/2010 Document Revised: 06/19/2011 Document Reviewed: 12/08/2010  ExitCare® Patient Information ©2015 [employer]. This information is not intended to replace advice given to you by your health care provider. Make sure you discuss any questions you have with your health care provider.

## 2015-12-20 NOTE — Progress Notes (Signed)
Current Meds  Medication Sig  . clonazePAM (KLONOPIN) 1 MG tablet Take 1 mg by mouth 3 (three) times daily.  . diclofenac (CATAFLAM) 50 MG tablet Take 1 tablet (50 mg total) by mouth 2 (two) times daily.  . phenazopyridine (PYRIDIUM) 200 MG tablet Take 1 tablet (200 mg total) by mouth 3 (three) times daily.  Marland Kitchen zolpidem (AMBIEN) 5 MG tablet Take 5-10 mg by mouth at bedtime as needed for sleep.    BP 126/77   Pulse (!) 59   Ht 6\' 1"  (1.854 m)   Wt 247 lb (112 kg)   BMI 32.59 kg/m   Chief Complaint  Patient presents with  . Knee Problem    LEFT KNEE SWELLING    45 year old male being treated for osteoarthritis left knee has received injections on 2 occasions he is on Cataflam 50 mg presents with increasing pain swelling in his left knee. He has x-rays showing moderate arthritis of his medial compartment. No new injury. Just increasing pain and swelling  Review of Systems  Constitutional: Negative for chills, fever and weight loss.  Respiratory: Negative for shortness of breath.   Cardiovascular: Negative for chest pain.  Neurological: Negative for tingling.   Past Medical History:  Diagnosis Date  . Anxiety   . GERD (gastroesophageal reflux disease)    occ  . Hypertension    "Dr Karie Kirks took me off meds"  diet control    Past Surgical History:  Procedure Laterality Date  . BACK SURGERY    . CARPAL TUNNEL RELEASE Right 06/25/2013   Procedure: CARPAL TUNNEL RELEASE;  Surgeon: Carole Civil, MD;  Location: [facility];  Service: Orthopedics;  Laterality: Right;  . CARPAL TUNNEL RELEASE Left 07/25/2013   Procedure: LEFT CARPAL TUNNEL RELEASE;  Surgeon: Carole Civil, MD;  Location: [facility];  Service: Orthopedics;  Laterality: Left;  . ELBOW SURGERY Left   . FOOT SURGERY    . HERNIA REPAIR Right    inguinal- age 1  . KNEE SURGERY    . left elbow    . LUMBAR LAMINECTOMY/DECOMPRESSION MICRODISCECTOMY Left 10/06/2013   Procedure: Left Lumbar Three-four microdiskectomy;   Surgeon: Ophelia Charter, MD;  Location: [facility];  Service: Neurosurgery;  Laterality: Left;  Left Lumbar Three-four microdiskectomy  . right foot     forgein body removal  . right knee  orif right patella Keeling 1993  . SEPTOPLASTY      Physical Exam  Constitutional: He is oriented to person, place, and time. He appears well-developed and well-nourished. No distress.  Cardiovascular: Normal rate and intact distal pulses.   Musculoskeletal:  Normal gait pattern  Neurological: He is alert and oriented to person, place, and time.  Skin: Skin is warm and dry. No rash noted. He is not diaphoretic. No erythema. No pallor.  Psychiatric: He has a normal mood and affect. His behavior is normal. Judgment and thought content normal.   Ortho Exam   Left knee medial joint line tenderness with small effusion. The joint line tenderness is on the condyle versus the meniscus. His flexion arc is 120 his knee is stable his muscle strength and tone are normal skin is intact without rash no peripheral edema normal sensation. Right knee has a nevus incision from a patella fracture with minimal restrictions in range of motion.  X-ray today shows patellofemoral spurs and medial compartment joint space narrowing  At this point I think he is managing conservatively fairly well. He would benefit from her brace  with hinges on unloader brace but he has Medicaid and they will cover that so recommendation is for an over-the-counter hinged brace if he can afford it  Repeat injection. Continue Cataflam. Add tramadol. He will follow-up with [place] depending on symptoms  Procedure note left knee injection verbal consent was obtained to inject left knee joint  Timeout was completed to confirm the site of injection  The medications used were 40 mg of Depo-Medrol and 1% lidocaine 3 cc  Anesthesia was provided by ethyl chloride and the skin was prepped with alcohol.  After cleaning the skin with alcohol a 20-gauge  needle was used to inject the left knee joint. There were no complications. A sterile bandage was applied.

## 2016-03-14 NOTE — Congregational Nurse Program (Signed)
Congregational Nurse Program Note  Date of Encounter: 03/07/2016  Past Medical History: Past Medical History:  Diagnosis Date  . Anxiety   . GERD (gastroesophageal reflux disease)    occ  . Hypertension    "Dr Karie Kirks took me off meds"  diet control    Encounter Details:     CNP Questionnaire - 03/07/16 1015      Patient Demographics   Is this a new or existing patient? New   Patient is considered a/an Not Applicable   Race Caucasian/White     Patient Assistance   Location of Patient Assistance [facility] [employer] [place]   Patient's financial/insurance status Low Income;Self-Pay (Uninsured)   Uninsured Patient ([place] [employer]) No   Patient referred to apply for the following financial assistance Not Applicable   Food insecurities addressed Not Applicable   Transportation assistance No   Assistance securing medications No   Doctor, hospital the healthcare system;Hypertension     Encounter Details   Primary purpose of visit Education/Health Concerns;Chronic Illness/Condition Visit;Navigating the Healthcare System   Was an Emergency Department visit averted? Not Applicable   Does patient have a medical provider? Yes   Patient referred to Follow up with established PCP   Was a mental health screening completed? (GAINS tool) No   Does patient have dental issues? No   Does patient have vision issues? No   Does your patient have an abnormal blood pressure today? Yes   Since previous encounter, have you referred patient for abnormal blood pressure that resulted in a new diagnosis or medication change? No   Does your patient have an abnormal blood glucose today? No   Since previous encounter, have you referred patient for abnormal blood glucose that resulted in a new diagnosis or medication change? No   Was there a life-saving intervention made? No      New Client encountered at blood pressure screening. Client has an established MD in Dr  Karie Kirks. Encouraged client to follow up with established MD PCP.

## 2016-04-10 DIAGNOSIS — B1920 Unspecified viral hepatitis C without hepatic coma: Secondary | ICD-10-CM

## 2016-04-10 HISTORY — DX: Unspecified viral hepatitis C without hepatic coma: B19.20

## 2016-04-21 ENCOUNTER — Encounter: Payer: Self-pay | Admitting: Orthopedic Surgery

## 2016-04-21 ENCOUNTER — Ambulatory Visit: Payer: Medicaid Other | Admitting: Orthopedic Surgery

## 2016-04-28 ENCOUNTER — Ambulatory Visit (INDEPENDENT_AMBULATORY_CARE_PROVIDER_SITE_OTHER): Payer: Medicaid Other | Admitting: Orthopedic Surgery

## 2016-04-28 DIAGNOSIS — M1710 Unilateral primary osteoarthritis, unspecified knee: Secondary | ICD-10-CM | POA: Diagnosis not present

## 2016-04-28 DIAGNOSIS — G8929 Other chronic pain: Secondary | ICD-10-CM

## 2016-04-28 DIAGNOSIS — M25462 Effusion, left knee: Secondary | ICD-10-CM | POA: Diagnosis not present

## 2016-04-28 DIAGNOSIS — M25562 Pain in left knee: Secondary | ICD-10-CM

## 2016-04-28 NOTE — Patient Instructions (Signed)
You have received an injection of steroids into the joint. 15% of patients will have increased pain within the 24 hours postinjection.   This is transient and will go away.   We recommend that you use ice packs on the injection site for 20 minutes every 2 hours and extra strength Tylenol 2 tablets every 8 as needed until the pain resolves.  If you continue to have pain after taking the Tylenol and using the ice please call the office for further instructions.   [place]

## 2016-04-28 NOTE — Progress Notes (Signed)
Patient ID: Jamie [name], male   DOB: 1971/03/04, 46 y.o.   MRN: [id number]  No chief complaint on file.   HPI Jamie [name] is a 46 y.o. male.   HPI  46 year old male followed for arthritis of his left knee previously treated with Cataflam, tramadol and to injections. We recommended a hinged knee brace but he is on Medicaid and they do not cover that presents for follow-up evaluation and treatment of ongoing knee pain  Review of Systems Review of Systems  Constitutional: Negative for fever.  Neurological: Negative for weakness and numbness.    Past Medical History:  Diagnosis Date  . Anxiety   . GERD (gastroesophageal reflux disease)    occ  . Hypertension    "Dr Karie Kirks took me off meds"  diet control      Physical Exam  Normal appearance grooming hygiene oriented 3 mood affect normal gait slight limp  Inspection of the left knee shows effusion with tenderness medial lateral joint line primarily medially. Knee flexion still 130 knee stable in all planes, strength and muscle tone normal skin intact without rash pulses good distally no edema normal sensation and normal lymph nodes  Right knee has an incision over the front from prior surgery no effusion   MEDICAL DECISION MAKING  DATA x-ray was taken on 12/20/2015 shows arthritis medial compartment with secondary bone changes of osteophyte formation  DIAGNOSIS  Encounter Diagnoses  Name Primary?  Marland Kitchen Arthritis of knee Yes  . Chronic pain of left knee   . Effusion of knee joint, left      PLAN(RISK)   Recommend bilateral knee braces plus aspiration injection plus continue naproxen and come back in 6 months for repeat x-rays  He is obviously too young for total knee replacement but that's where this is headed  We will continue nonoperative treatment until he reaches end-stage disease   Procedure note injection and aspiration left knee joint  Verbal consent was obtained to aspirate and inject the left knee  joint   Timeout was completed to confirm the site of aspiration and injection  An 18-gauge needle was used to aspirate the left knee joint from a suprapatellar lateral approach.  The medications used were 40 mg of Depo-Medrol and 1% lidocaine 3 cc  Anesthesia was provided by ethyl chloride and the skin was prepped with alcohol.  After cleaning the skin with alcohol an 18-gauge needle was used to aspirate the right knee joint.  We obtained 10 cc of fluid  We follow this by injection of 40 mg of Depo-Medrol and 3 cc 1% lidocaine.  There were no complications. A sterile bandage was applied.

## 2016-06-07 ENCOUNTER — Encounter: Payer: Self-pay | Admitting: Internal Medicine

## 2016-06-15 ENCOUNTER — Telehealth: Payer: Self-pay

## 2016-06-15 ENCOUNTER — Ambulatory Visit (INDEPENDENT_AMBULATORY_CARE_PROVIDER_SITE_OTHER): Payer: Medicaid Other | Admitting: Nurse Practitioner

## 2016-06-15 ENCOUNTER — Encounter: Payer: Self-pay | Admitting: Nurse Practitioner

## 2016-06-15 DIAGNOSIS — B182 Chronic viral hepatitis C: Secondary | ICD-10-CM | POA: Diagnosis not present

## 2016-06-15 DIAGNOSIS — B1920 Unspecified viral hepatitis C without hepatic coma: Secondary | ICD-10-CM | POA: Insufficient documentation

## 2016-06-15 LAB — PROTIME-INR
INR: 1
Prothrombin Time: 10.8 s (ref 9.0–11.5)

## 2016-06-15 LAB — COMPREHENSIVE METABOLIC PANEL
ALT: 62 U/L — ABNORMAL HIGH (ref 9–46)
AST: 36 U/L (ref 10–40)
Albumin: 4.4 g/dL (ref 3.6–5.1)
Alkaline Phosphatase: 37 U/L — ABNORMAL LOW (ref 40–115)
BUN: 14 mg/dL (ref 7–25)
CO2: 25 mmol/L (ref 20–31)
Calcium: 9.4 mg/dL (ref 8.6–10.3)
Chloride: 106 mmol/L (ref 98–110)
Creat: 0.9 mg/dL (ref 0.60–1.35)
Glucose, Bld: 94 mg/dL (ref 65–99)
Potassium: 4.2 mmol/L (ref 3.5–5.3)
Sodium: 139 mmol/L (ref 135–146)
Total Bilirubin: 1 mg/dL (ref 0.2–1.2)
Total Protein: 7.8 g/dL (ref 6.1–8.1)

## 2016-06-15 LAB — CBC WITH DIFFERENTIAL/PLATELET
Basophils Absolute: 0 cells/uL (ref 0–200)
Basophils Relative: 0 %
Eosinophils Absolute: 64 cells/uL (ref 15–500)
Eosinophils Relative: 1 %
HCT: 44 % (ref 38.5–50.0)
Hemoglobin: 15 g/dL (ref 13.2–17.1)
Lymphocytes Relative: 27 %
Lymphs Abs: 1728 cells/uL (ref 850–3900)
MCH: 31.9 pg (ref 27.0–33.0)
MCHC: 34.1 g/dL (ref 32.0–36.0)
MCV: 93.6 fL (ref 80.0–100.0)
MPV: 10.5 fL (ref 7.5–12.5)
Monocytes Absolute: 512 cells/uL (ref 200–950)
Monocytes Relative: 8 %
Neutro Abs: 4096 cells/uL (ref 1500–7800)
Neutrophils Relative %: 64 %
Platelets: 175 10*3/uL (ref 140–400)
RBC: 4.7 MIL/uL (ref 4.20–5.80)
RDW: 13.9 % (ref 11.0–15.0)
WBC: 6.4 10*3/uL (ref 3.8–10.8)

## 2016-06-15 LAB — HEPATITIS B SURFACE ANTIBODY,QUALITATIVE: Hep B S Ab: NEGATIVE

## 2016-06-15 NOTE — Telephone Encounter (Signed)
PA info for [place] abd complete with elastography submitted online via [employer]. Case pending. OV notes faxed to [place]. Service order: [id number].

## 2016-06-15 NOTE — Progress Notes (Signed)
cc'ed to pcp °

## 2016-06-15 NOTE — Patient Instructions (Signed)
1. Have your lab tests drawn when you're able to. 2. We will schedule your ultrasound for you. 3. We will be working on the process to get hepatitis C treatment approved by her insurance. 4. We make call you needing to do additional tests area 5. Return for follow-up in 2 months. 6. Call us if you have any questions or concerns.

## 2016-06-15 NOTE — Assessment & Plan Note (Signed)
The patient presents from primary care with a positive hepatitis C antibody confirmed by HCV RNA. At this point it is not clear where he picked up his infection. Denies alcohol and IV drug use. At this point we will further his workup with CBC, CMP, hepatitis C genotype. I will also add on hepatitis B surface antibody as a last test for check of vaccination/immunity. He will need hepatitis A, and likely hepatitis B, vaccine series. I will also order ultrasound elastography. Return for follow-up in 2 months and we will be in contact with him over the phone related to his [place] process.

## 2016-06-15 NOTE — Addendum Note (Signed)
Addended by: Gordy Levan, Margarie Mcguirt A on: 06/15/2016 10:47 AM   Modules accepted: Orders

## 2016-06-15 NOTE — Progress Notes (Signed)
Primary Care Physician:  Robert Bellow, MD Primary Gastroenterologist:  Dr. Gala Romney  Chief Complaint  Patient presents with  . Hepatitis C  . Abdominal Pain    soreness right middle abd    HPI:   Jamie [name] is a 46 y.o. male who presents on referral from primary care for positive hepatitis C antibody. PCP notes reviewed. Labs reviewed. The patient tested positive for hepatitis C. Acute hepatitis panel was negative for hepatitis A IgM, negative for hepatitis B surface antigen, negative for hepatitis B core antibody. Antibody for hepatitis C was confirmed by RNA with 2.4 million copies with a log of 6.38. Still requires genotype testing.  Today he states he's been feeling weak and with malaise reently. Unable to sleep well and has been titrating his sleep medicines. Has been having abdominal pain on the right side, described as sore as well as in his back. Has been using Naproxen twice a day but not very effective. His abdominal pain starts RUQ and radiates around to his back. Has put on some weight over the last few months. Denies N/V, hematochezia, melena. Occasional bilateral ankle swelling. Denies yellowing of skin/eyes, darkened urine, acute confusion. Denies chest pain, dizziness, lightheadedness, syncope, near syncope. Denies any other upper or lower GI symptoms.  History of colon cancer in his father. Has had a colonoscopy about 5-6 years ago here. Completed 02/19/2008.   Hepatitis C Risk Factors:  Birth cohort ([place]): No IV drug use: No Tattoos: No Blood product transfusion: No HC worker: No Hemodialysis: No Maternal infection: No  Past Medical History:  Diagnosis Date  . Anxiety   . GERD (gastroesophageal reflux disease)    occ  . HCV antibody positive   . Hypertension    "Dr Karie Kirks took me off meds"  diet control    Past Surgical History:  Procedure Laterality Date  . BACK SURGERY    . CARPAL TUNNEL RELEASE Right 06/25/2013   Procedure: CARPAL  TUNNEL RELEASE;  Surgeon: Carole Civil, MD;  Location: [facility];  Service: Orthopedics;  Laterality: Right;  . CARPAL TUNNEL RELEASE Left 07/25/2013   Procedure: LEFT CARPAL TUNNEL RELEASE;  Surgeon: Carole Civil, MD;  Location: [facility];  Service: Orthopedics;  Laterality: Left;  . ELBOW SURGERY Left   . FOOT SURGERY    . HERNIA REPAIR Right    inguinal- age 76  . KNEE SURGERY    . left elbow    . LUMBAR LAMINECTOMY/DECOMPRESSION MICRODISCECTOMY Left 10/06/2013   Procedure: Left Lumbar Three-four microdiskectomy;  Surgeon: Ophelia Charter, MD;  Location: [facility];  Service: Neurosurgery;  Laterality: Left;  Left Lumbar Three-four microdiskectomy  . right foot     forgein body removal  . right knee  orif right patella Keeling 1993  . SEPTOPLASTY      Current Outpatient Prescriptions  Medication Sig Dispense Refill  . ALPRAZolam (XANAX) 0.5 MG tablet Take 0.5 mg by mouth at bedtime as needed for anxiety or sleep.    . clonazePAM (KLONOPIN) 1 MG tablet Take 1 mg by mouth 3 (three) times daily.    . naproxen (NAPROSYN) 500 MG tablet Take 500 mg by mouth 2 (two) times daily with a meal.     No current facility-administered medications for this visit.     Allergies as of 06/15/2016  . (No Known Allergies)    Family History  Problem Relation Age of Onset  . Heart disease    .  Arthritis    . Cancer    . Asthma    . Diabetes    . Kidney disease    . Colon cancer Father     Social History   Social History  . Marital status: Legally Separated    Spouse name: is seperated  . Number of children: N/A  . Years of education: 8th grade    Occupational History  . unemployed Unemployed   Social History Main Topics  . Smoking status: Former Smoker    Packs/day: 1.00    Years: 15.00    Types: Cigarettes    Quit date: 12/21/2012  . Smokeless tobacco: Never Used  . Alcohol use No  . Drug use: Yes    Frequency: 2.0 times per week    Types: Marijuana     Comment: for  pain/anxiety  . Sexual activity: No   Other Topics Concern  . Not on file   Social History Narrative  . No narrative on file    Review of Systems: General: Negative for anorexia, weight loss, fever, chills. Eyes: Negative for vision changes.  ENT: Negative for hoarseness, difficulty swallowing , nasal congestion. CV: Negative for chest pain, angina, palpitations, dyspnea on exertion, peripheral edema.  Respiratory: Negative for dyspnea at rest, dyspnea on exertion, cough, sputum, wheezing.  GI: See history of present illness. MS: Admits chronic back pain.  Derm: Negative for rash or itching.  Endo: Negative for unusual weight change.  Heme: Negative for bruising or bleeding. Allergy: Negative for rash or hives.    Physical Exam: BP 140/82   Pulse 62   Temp 97.7 F (36.5 C) (Oral)   Ht 5\' 10"  (1.778 m)   Wt 244 lb (110.7 kg)   BMI 35.01 kg/m  General:   Alert and oriented. Pleasant and cooperative. Well-nourished and well-developed.  Head:  Normocephalic and atraumatic. Eyes:  Without icterus, sclera clear and conjunctiva pink.  Ears:  Normal auditory acuity. Cardiovascular:  S1, S2 present without murmurs appreciated. Extremities without clubbing or edema. Respiratory:  Clear to auscultation bilaterally. No wheezes, rales, or rhonchi. No distress.  Gastrointestinal:  +BS, soft, non-tender and non-distended. No HSM noted. No guarding or rebound. No masses appreciated.  Rectal:  Deferred  Musculoskalatal:  Symmetrical without gross deformities. Neurologic:  Alert and oriented x4;  grossly normal neurologically. Psych:  Alert and cooperative. Normal mood and affect. Heme/Lymph/Immune: NNo excessive bruising noted.    06/15/2016 9:55 AM   Disclaimer: This note was dictated with voice recognition software. Similar sounding words can inadvertently be transcribed and may not be corrected upon review.

## 2016-06-19 LAB — HEPATITIS C GENOTYPE

## 2016-06-19 NOTE — Telephone Encounter (Signed)
US abdomen complete approved. PA# [id number]. 06/15/16-07/15/16.

## 2016-06-26 ENCOUNTER — Ambulatory Visit (HOSPITAL_COMMUNITY)
Admission: RE | Admit: 2016-06-26 | Discharge: 2016-06-26 | Disposition: A | Discharge status: Home / Self Care | Payer: Medicaid Other | Source: Ambulatory Visit | Attending: Nurse Practitioner | Admitting: Nurse Practitioner

## 2016-06-26 DIAGNOSIS — R160 Hepatomegaly, not elsewhere classified: Secondary | ICD-10-CM | POA: Diagnosis not present

## 2016-06-26 DIAGNOSIS — E279 Disorder of adrenal gland, unspecified: Secondary | ICD-10-CM | POA: Diagnosis not present

## 2016-06-26 DIAGNOSIS — B182 Chronic viral hepatitis C: Secondary | ICD-10-CM | POA: Diagnosis present

## 2016-06-28 NOTE — Telephone Encounter (Signed)
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 Indicates 12 weeks therapy. Medications checked for interactions and none found. Paperwork completed to be submitted for treatment with Mavyret based on Medicaid formulary    Will need CBC, CMP, HCV RNA at 4 weeks after treatment start.  DirectionsL take 3 times a day WITH FOOD.

## 2016-06-29 NOTE — Telephone Encounter (Signed)
Paperwork faxed to [facility] on 06/28/2016.

## 2016-07-05 ENCOUNTER — Telehealth: Payer: Self-pay | Admitting: Internal Medicine

## 2016-07-05 NOTE — Telephone Encounter (Signed)
Sonia Baller from [facility] called to let us know patient's Hep C med would be delivered via [employer] in the morning.

## 2016-07-06 NOTE — Telephone Encounter (Signed)
Per Randall Hiss, pt should have an OV for 4 weeks to come in and then he will do the labs.

## 2016-07-06 NOTE — Telephone Encounter (Signed)
For [place] you will take 3 pills a day.  Take all 3 pills AT THE SAME TIME, once a day, same time every day. Do not miss a dose.  You will need to sign a form of medications we have listed that you take. We have checked that these will not interact with Mavyret.  Do not start any new medications (prescription, over-the-counter, herbal, supplement, or otherwise) without CHECKING WITH OUR OFFICE FIRST.  Call us if you have any questions or side effects.

## 2016-07-06 NOTE — Telephone Encounter (Signed)
Randall Hiss, the delivery is expected today for the [place].  What instructions do you want me to give the pt.

## 2016-07-10 NOTE — Telephone Encounter (Signed)
PT is aware to come pick up the medication. I reviewed all of the instructions with him. His appt is already scheduled for 08/15/2016 at 10:00 Am with Walden Field, NP.  He will get labs scheduled at that [facility].

## 2016-08-02 NOTE — Telephone Encounter (Signed)
Patient called in stating he's been taking Mavyret three times a day at the same time for about two and half weeks.  He's noticed that he's been very tired and wanted to know if fatigue a common side effect from taking this medication.  Routing to [employer] for advice.  Patient can be reached [phone].

## 2016-08-02 NOTE — Telephone Encounter (Signed)
Fatigue is one of the few major potential side effects (can occur in 11-14% of patients). Let us know if it gets pretty bad and we can check some labs. Ask if he's seen any obvious bleeding (that could raise suspicion for anemia in addition to being on [place])

## 2016-08-03 ENCOUNTER — Telehealth: Payer: Self-pay

## 2016-08-03 NOTE — Telephone Encounter (Signed)
Pt was returning a call from DS. Please call him back at [phone]

## 2016-08-03 NOTE — Telephone Encounter (Signed)
Pt was returning a call from DS. Please call him back at [phone]

## 2016-08-03 NOTE — Telephone Encounter (Signed)
Pt is aware.  

## 2016-08-03 NOTE — Telephone Encounter (Signed)
LMOM to call.

## 2016-08-15 ENCOUNTER — Encounter: Payer: Self-pay | Admitting: Nurse Practitioner

## 2016-08-15 ENCOUNTER — Ambulatory Visit (INDEPENDENT_AMBULATORY_CARE_PROVIDER_SITE_OTHER): Payer: Medicaid Other | Admitting: Nurse Practitioner

## 2016-08-15 VITALS — BP 143/88 | HR 56 | Temp 97.0°F | Ht 71.0 in | Wt 244.2 lb

## 2016-08-15 DIAGNOSIS — B182 Chronic viral hepatitis C: Secondary | ICD-10-CM

## 2016-08-15 NOTE — Progress Notes (Signed)
cc'ed to pcp °

## 2016-08-15 NOTE — Progress Notes (Signed)
Referring Provider: Lemmie Evens, MD Primary Care Physician:  Lemmie Evens, MD Primary GI:  Dr. Gala Romney  Chief Complaint  Patient presents with  . Hepatitis C    f/u, doing ok    HPI:   Jamie [name] is a 46 y.o. male who presents for follow-up on hepatitis C. The patient was last seen in our office 06/15/2016 for hepatitis C and right middle abdominal pain. Positive hepatitis C antibody and RNA confirmation. Negative hepatitis A and B. Noted right-sided abdominal pain described as a soreness as well as in his back. Using NSAIDs which are not very effective. Noted some weakness and malaise, difficulty sleeping. Occasional bilateral ankle swelling. Denies any other hepatic symptoms. Last colonoscopy 02/19/2008. Negative for most hepatitis C risk factors, per patient admission/description. Recommended further lab evaluation, ultrasound elastography, begin process possible hepatitis C treatment, return for follow-up in 2 months.  Labs completed 06/15/2016 and found hepatitis C genotype 1A, completion of hepatitis B testing indicates need for hepatitis A and B vaccination. CBC essentially normal, ALT mildly elevated at 62 with CMP otherwise normal, INR normal. Right upper quadrant ultrasound elastography found some F3 + F4.   Based on compensated cirrhosis (some F4, Child-Pugh A) and treatment-naive: expected treatment duration: 12 weeks.  Per insurance guidelines patient was approved for [place]. The medication was delivered, instructions provided the patient, scheduled for 4 week office visit for labs. The patient called noting fatigue which isn't known adverse effect with this medication. Patient notified. He will notify us if he has any worsening symptoms.  Today he states he's doing ok. His PCP started him on levaquin for dysuria; states PCP checked interactions. I checked for interactions with his mavyret and none found. Fatigue improved. Occasional/rare left-sided abdominal  pain/bloating; Overall he's "doing pretty good." Denies N/V, hematochezia, melena, unintentional weight loss, acute changes in bowel habits; states he's actually going better then he was previously. Denies yellowing of skin/eyes, darkened urine, acute episodic confusion, tremors. Denies chest pain, dyspnea, dizziness, lightheadedness, syncope, near syncope. Denies any other upper or lower GI symptoms.  Past Medical History:  Diagnosis Date  . Anxiety   . GERD (gastroesophageal reflux disease)    occ  . HCV antibody positive   . Hypertension    "Dr Karie Kirks took me off meds"  diet control    Past Surgical History:  Procedure Laterality Date  . BACK SURGERY    . CARPAL TUNNEL RELEASE Right 06/25/2013   Procedure: CARPAL TUNNEL RELEASE;  Surgeon: Carole Civil, MD;  Location: [facility];  Service: Orthopedics;  Laterality: Right;  . CARPAL TUNNEL RELEASE Left 07/25/2013   Procedure: LEFT CARPAL TUNNEL RELEASE;  Surgeon: Carole Civil, MD;  Location: [facility];  Service: Orthopedics;  Laterality: Left;  . ELBOW SURGERY Left   . FOOT SURGERY    . HERNIA REPAIR Right    inguinal- age 83  . KNEE SURGERY    . left elbow    . LUMBAR LAMINECTOMY/DECOMPRESSION MICRODISCECTOMY Left 10/06/2013   Procedure: Left Lumbar Three-four microdiskectomy;  Surgeon: Ophelia Charter, MD;  Location: [facility];  Service: Neurosurgery;  Laterality: Left;  Left Lumbar Three-four microdiskectomy  . right foot     forgein body removal  . right knee  orif right patella Keeling 1993  . SEPTOPLASTY      Current Outpatient Prescriptions  Medication Sig Dispense Refill  . ALPRAZolam (XANAX) 0.5 MG tablet Take 0.5 mg by mouth at bedtime as needed for anxiety  or sleep.    . clonazePAM (KLONOPIN) 1 MG tablet Take 1 mg by mouth 3 (three) times daily.    . Glecaprevir-Pibrentasvir (MAVYRET) 100-40 MG TABS Take 3 tablets by mouth daily.    Marland Kitchen levofloxacin (LEVAQUIN) 500 MG tablet Take 500 mg by mouth daily.  0  .  naproxen (NAPROSYN) 500 MG tablet Take 500 mg by mouth 2 (two) times daily with a meal.     No current facility-administered medications for this visit.     Allergies as of 08/15/2016  . (No Known Allergies)    Family History  Problem Relation Age of Onset  . Heart disease    . Arthritis    . Cancer    . Asthma    . Diabetes    . Kidney disease    . Colon cancer Father     Social History   Social History  . Marital status: Legally Separated    Spouse name: is seperated  . Number of children: N/A  . Years of education: 8th grade    Occupational History  . unemployed Unemployed   Social History Main Topics  . Smoking status: Former Smoker    Packs/day: 1.00    Years: 15.00    Types: Cigarettes    Quit date: 12/21/2012  . Smokeless tobacco: Never Used  . Alcohol use No  . Drug use: Yes    Frequency: 2.0 times per week    Types: Marijuana     Comment: for pain/anxiety; denied 08/15/16  . Sexual activity: No   Other Topics Concern  . None   Social History Narrative  . None    Review of Systems: General: Negative for anorexia, weight loss, fever, chills, fatigue, weakness. ENT: Negative for hoarseness, difficulty swallowing. CV: Negative for chest pain, angina, palpitations, peripheral edema.  Respiratory: Negative for dyspnea at rest, cough, sputum, wheezing.  GI: See history of present illness. Endo: Negative for unusual weight change.  Heme: Negative for bruising or bleeding. Allergy: Negative for rash or hives.   Physical Exam: BP (!) 143/88   Pulse (!) 56   Temp 97 F (36.1 C) (Oral)   Ht 5\' 11"  (1.803 m)   Wt 244 lb 3.2 oz (110.8 kg)   BMI 34.06 kg/m  General:   Alert and oriented. Pleasant and cooperative. Well-nourished and well-developed.  Head:  Normocephalic and atraumatic. Eyes:  Without icterus, sclera clear and conjunctiva pink.  Ears:  Normal auditory acuity. Cardiovascular:  S1, S2 present without murmurs appreciated. Extremities  without clubbing or edema. Respiratory:  Clear to auscultation bilaterally. No wheezes, rales, or rhonchi. No distress.  Gastrointestinal:  +BS, rounded but soft, and non-distended. Minimal generalized TTP. No HSM noted. No guarding or rebound. No masses appreciated.  Rectal:  Deferred  Musculoskalatal:  Symmetrical without gross deformities. Neurologic:  Alert and oriented x4;  grossly normal neurologically. Psych:  Alert and cooperative. Normal mood and affect. Heme/Lymph/Immune: No excessive bruising noted.    08/15/2016 10:58 AM   Disclaimer: This note was dictated with voice recognition software. Similar sounding words can inadvertently be transcribed and may not be corrected upon review.

## 2016-08-15 NOTE — Patient Instructions (Signed)
1. Have your labs drawn when you're able to. 2. Return for follow-up in 5 months for additional labs and follow-up. 3. Call us if you have any worsening problems or symptoms. 4. Call us if your going to take any new medications including prescription, over-the-counter, herbs, supplements, etc.

## 2016-08-15 NOTE — Assessment & Plan Note (Signed)
He is completed 4 weeks of treatment out of 8 weeks total duration of treatment. He is doing well. Initially he did have some weakness and fatigue but this is resolved after about 2 weeks. He started taking Levaquin and while he did not notify us she did notify his PCP to check for adverse interactions. At this point he is due for labs which I will order today including CBC, CMP, PTT/INR, hepatitis C RNA. I will have him come back in 5 months for 3 month post treatment labs and follow-up. I have asked him to call us if he has any problems or worsening symptoms.

## 2016-08-16 LAB — PROTIME-INR
INR: 1
Prothrombin Time: 10.8 s (ref 9.0–11.5)

## 2016-08-16 LAB — COMPREHENSIVE METABOLIC PANEL
ALT: 12 U/L (ref 9–46)
AST: 16 U/L (ref 10–40)
Albumin: 4.2 g/dL (ref 3.6–5.1)
Alkaline Phosphatase: 38 U/L — ABNORMAL LOW (ref 40–115)
BUN: 14 mg/dL (ref 7–25)
CO2: 27 mmol/L (ref 20–31)
Calcium: 8.8 mg/dL (ref 8.6–10.3)
Chloride: 105 mmol/L (ref 98–110)
Creat: 0.93 mg/dL (ref 0.60–1.35)
Glucose, Bld: 118 mg/dL — ABNORMAL HIGH (ref 65–99)
Potassium: 3.9 mmol/L (ref 3.5–5.3)
Sodium: 140 mmol/L (ref 135–146)
Total Bilirubin: 1.1 mg/dL (ref 0.2–1.2)
Total Protein: 7 g/dL (ref 6.1–8.1)

## 2016-08-16 LAB — CBC WITH DIFFERENTIAL/PLATELET
Basophils Absolute: 0 cells/uL (ref 0–200)
Basophils Relative: 0 %
Eosinophils Absolute: 130 cells/uL (ref 15–500)
Eosinophils Relative: 2 %
HCT: 44.1 % (ref 38.5–50.0)
Hemoglobin: 15.2 g/dL (ref 13.2–17.1)
Lymphocytes Relative: 30 %
Lymphs Abs: 1950 cells/uL (ref 850–3900)
MCH: 32.5 pg (ref 27.0–33.0)
MCHC: 34.5 g/dL (ref 32.0–36.0)
MCV: 94.4 fL (ref 80.0–100.0)
MPV: 10.3 fL (ref 7.5–12.5)
Monocytes Absolute: 325 cells/uL (ref 200–950)
Monocytes Relative: 5 %
Neutro Abs: 4095 cells/uL (ref 1500–7800)
Neutrophils Relative %: 63 %
Platelets: 198 10*3/uL (ref 140–400)
RBC: 4.67 MIL/uL (ref 4.20–5.80)
RDW: 14 % (ref 11.0–15.0)
WBC: 6.5 10*3/uL (ref 3.8–10.8)

## 2016-08-18 LAB — HEPATITIS C RNA QUANTITATIVE
HCV Quantitative Log: <1.18 Log IU/mL
HCV Quantitative: <15 IU/mL

## 2016-08-25 ENCOUNTER — Telehealth: Payer: Self-pay

## 2016-08-25 NOTE — Telephone Encounter (Signed)
Pt called and was informed of lab results ( see Eric's result note of 08/16/2016 and 08/18/2016). He is aware to continue medication and plan.

## 2016-08-29 ENCOUNTER — Ambulatory Visit (INDEPENDENT_AMBULATORY_CARE_PROVIDER_SITE_OTHER): Payer: Medicaid Other | Admitting: Orthopedic Surgery

## 2016-08-29 ENCOUNTER — Encounter: Payer: Self-pay | Admitting: Orthopedic Surgery

## 2016-08-29 DIAGNOSIS — M1710 Unilateral primary osteoarthritis, unspecified knee: Secondary | ICD-10-CM | POA: Diagnosis not present

## 2016-08-29 DIAGNOSIS — M25562 Pain in left knee: Secondary | ICD-10-CM

## 2016-08-29 DIAGNOSIS — M25462 Effusion, left knee: Secondary | ICD-10-CM

## 2016-08-29 DIAGNOSIS — G8929 Other chronic pain: Secondary | ICD-10-CM | POA: Diagnosis not present

## 2016-08-29 NOTE — Patient Instructions (Signed)

## 2016-08-29 NOTE — Progress Notes (Signed)
Recurrent problem OFFICE VISIT    Chief Complaint  Patient presents with  . Follow-up    left knee pain and swelling    46 year old male with history of pain swelling in his left knee previous aspiration injection and cortisone with bracing. He presents now with a three-week history of recurrent pain swelling decreased range of motion left knee no trauma    Review of Systems  Constitutional: Negative for chills and fever.  Skin: Negative for rash.     Past Medical History:  Diagnosis Date  . Anxiety   . GERD (gastroesophageal reflux disease)    occ  . HCV antibody positive   . Hypertension    "Dr Karie Kirks took me off meds"  diet control    Past Surgical History:  Procedure Laterality Date  . BACK SURGERY    . CARPAL TUNNEL RELEASE Right 06/25/2013   Procedure: CARPAL TUNNEL RELEASE;  Surgeon: Carole Civil, MD;  Location: [facility];  Service: Orthopedics;  Laterality: Right;  . CARPAL TUNNEL RELEASE Left 07/25/2013   Procedure: LEFT CARPAL TUNNEL RELEASE;  Surgeon: Carole Civil, MD;  Location: [facility];  Service: Orthopedics;  Laterality: Left;  . ELBOW SURGERY Left   . FOOT SURGERY    . HERNIA REPAIR Right    inguinal- age 46  . KNEE SURGERY    . left elbow    . LUMBAR LAMINECTOMY/DECOMPRESSION MICRODISCECTOMY Left 10/06/2013   Procedure: Left Lumbar Three-four microdiskectomy;  Surgeon: Ophelia Charter, MD;  Location: [facility];  Service: Neurosurgery;  Laterality: Left;  Left Lumbar Three-four microdiskectomy  . right foot     forgein body removal  . right knee  orif right patella Keeling 1993  . SEPTOPLASTY      Family History  Problem Relation Age of Onset  . Heart disease Unknown   . Arthritis Unknown   . Cancer Unknown   . Asthma Unknown   . Diabetes Unknown   . Kidney disease Unknown   . Colon cancer Father    Social History  Substance Use Topics  . Smoking status: Former Smoker    Packs/day: 1.00    Years: 15.00    Types: Cigarettes     Quit date: 12/21/2012  . Smokeless tobacco: Never Used  . Alcohol use No    There were no vitals taken for this visit.  Physical Exam  Constitutional: He appears well-developed and well-nourished.  Vital signs have been reviewed and are stable. Gen. appearance the patient is well-developed and well-nourished with normal grooming and hygiene. The patient is oriented 3 with normal mood and affect.  Vitals reviewed.   Ortho Exam  He does have a slight limp favoring the left side  He has a moderate effusion he has 125 of flexion he has periarticular soft tissue tenderness his ligaments are stable motor exam is normal his pulse is excellent the sensation is normal skin shows no rash or erythema  The opposite knee has normal range of motion strength and stability     Encounter Diagnoses  Name Primary?  Marland Kitchen Arthritis of knee Yes  . Chronic pain of left knee   . Effusion of knee joint, left      PLAN:   Aspiration injection left knee I got back 25 mL of fluid  Continue ice, bracing, follow-up as needed  Procedure note injection and aspiration left knee joint  Verbal consent was obtained to aspirate and inject the left knee joint   Timeout was completed  to confirm the site of aspiration and injection  An 18-gauge needle was used to aspirate the left knee joint from a suprapatellar lateral approach.  The medications used were 40 mg of Depo-Medrol and 1% lidocaine 3 cc  Anesthesia was provided by ethyl chloride and the skin was prepped with alcohol.  After cleaning the skin with alcohol an 18-gauge needle was used to aspirate the right knee joint.  We obtained 25 cc of fluid  We followed this by injection of 40 mg of Depo-Medrol and 3 cc 1% lidocaine.  There were no complications. A sterile bandage was applied.

## 2016-09-25 ENCOUNTER — Ambulatory Visit: Payer: Medicaid Other | Admitting: Orthopedic Surgery

## 2016-09-26 ENCOUNTER — Other Ambulatory Visit: Payer: Self-pay | Admitting: *Deleted

## 2016-09-26 ENCOUNTER — Encounter: Payer: Self-pay | Admitting: Orthopedic Surgery

## 2016-09-26 ENCOUNTER — Ambulatory Visit (INDEPENDENT_AMBULATORY_CARE_PROVIDER_SITE_OTHER): Payer: Medicaid Other | Admitting: Orthopedic Surgery

## 2016-09-26 DIAGNOSIS — M25462 Effusion, left knee: Secondary | ICD-10-CM

## 2016-09-26 MED ORDER — NAPROXEN 500 MG PO TABS: 500.0000 mg | ORAL_TABLET | Freq: Two times a day (BID) | ORAL | 2 refills | Status: DC

## 2016-09-26 NOTE — Progress Notes (Unsigned)
60

## 2016-09-26 NOTE — Progress Notes (Signed)
Follow up   Chief Complaint  Patient presents with  . Follow-up    LEFT KNEE SWELLING    46 year old male with history of pain swelling in his left knee previous aspiration injection and cortisone with bracing. He presents now with a 1-week history of recurrent pain swelling decreased range of motion left knee no trauma  The pain and swelling actually went down with ice and heat but he wanted it checked out as we discussed prior   Procedure note injection and aspiration left knee joint  Verbal consent was obtained to aspirate and inject the left knee joint   Timeout was completed to confirm the site of aspiration and injection  An 18-gauge needle was used to aspirate the left knee joint from a suprapatellar lateral approach.  The medications used were 40 mg of Depo-Medrol and 1% lidocaine 3 cc  Anesthesia was provided by ethyl chloride and the skin was prepped with alcohol.  After cleaning the skin with alcohol an 18-gauge needle was used to aspirate the right knee joint.  We obtained 8 cc of fluid  We followed this by injection of 40 mg of Depo-Medrol and 3 cc 1% lidocaine.  There were no complications. A sterile bandage was applied.

## 2016-10-27 ENCOUNTER — Other Ambulatory Visit: Payer: Medicaid Other

## 2016-10-27 ENCOUNTER — Encounter: Payer: Medicaid Other | Admitting: Orthopaedic Surgery

## 2016-10-30 ENCOUNTER — Other Ambulatory Visit: Payer: Self-pay | Admitting: *Deleted

## 2016-10-30 ENCOUNTER — Telehealth: Payer: Self-pay | Admitting: Orthopedic Surgery

## 2016-10-30 MED ORDER — DICLOFENAC POTASSIUM 50 MG PO TABS: 50.0000 mg | ORAL_TABLET | Freq: Two times a day (BID) | ORAL | 5 refills | Status: DC

## 2016-10-30 NOTE — Telephone Encounter (Signed)
Pt is asking for a refill of:  Diclofenac 50 mg

## 2016-10-30 NOTE — Progress Notes (Signed)
This encounter was created in error - please disregard.

## 2016-11-02 ENCOUNTER — Ambulatory Visit: Payer: Medicaid Other | Admitting: Orthopaedic Surgery

## 2016-11-03 ENCOUNTER — Ambulatory Visit (INDEPENDENT_AMBULATORY_CARE_PROVIDER_SITE_OTHER): Payer: Medicaid Other | Admitting: Orthopedic Surgery

## 2016-11-03 ENCOUNTER — Ambulatory Visit (INDEPENDENT_AMBULATORY_CARE_PROVIDER_SITE_OTHER): Payer: Medicaid Other

## 2016-11-03 DIAGNOSIS — M25462 Effusion, left knee: Secondary | ICD-10-CM

## 2016-11-03 DIAGNOSIS — M23204 Derangement of unspecified medial meniscus due to old tear or injury, left knee: Secondary | ICD-10-CM | POA: Diagnosis not present

## 2016-11-03 DIAGNOSIS — M1712 Unilateral primary osteoarthritis, left knee: Secondary | ICD-10-CM | POA: Diagnosis not present

## 2016-11-03 DIAGNOSIS — M25562 Pain in left knee: Secondary | ICD-10-CM

## 2016-11-03 DIAGNOSIS — G8929 Other chronic pain: Secondary | ICD-10-CM | POA: Diagnosis not present

## 2016-11-03 NOTE — Progress Notes (Signed)
Routine follow-up visit  Patient with chief complaint of recurrent left knee swelling and constant left knee pain  46 year old male currently on naproxen these had multiple aspirations injections left knee has osteoarthritis primarily medial compartment. His knee continues to swell and he continues have medial joint line pain. He has maintained excellent range of motion.  Review of Systems  Constitutional: Negative for fever.  Musculoskeletal: Positive for back pain and joint pain.  Neurological: Negative for tingling and sensory change.   Past Medical History:  Diagnosis Date  . Anxiety   . GERD (gastroesophageal reflux disease)    occ  . HCV antibody positive   . Hypertension    "Dr Karie Kirks took me off meds"  diet control    Current meds .  ALPRAZolam (XANAX) 0.5 MG tablet, Take 0.5 mg by mouth at bedtime as needed for anxiety or sleep., Disp: , Rfl:  .  clonazePAM (KLONOPIN) 1 MG tablet, Take 1 mg by mouth 3 (three) times daily., Disp: , Rfl:  .  naproxen (NAPROSYN) 500 MG tablet, Take 1 tablet (500 mg total) by mouth 2 (two) times daily with a meal., Disp: 60 tablet, Rfl: 2   He is awake alert and oriented 3 his mood and affect is normal his appearance is normal his body habitus is medium frame is ambulatory status shows no obvious limping  He has a left knee effusion tenderness along the medial joint line and varus alignment. He does have full flexion and extension of his left knee.  No ligamentous instability is detected. Motor exam is normal skin is clean dry and intact sensation is normal in color and capillary refill is normal without peripheral edema.  He has a well-healed incision over the right knee from prior surgery is maintained full range of motion  Imaging studies are obtained today  My interpretation of the left knee series is  Impression moderate to severe osteoarthritis left knee primarily medial compartment with some contribution from the patellofemoral  joint   Encounter Diagnoses  Name Primary?  . Chronic pain of left knee Yes  . Effusion of knee joint, left   . Primary osteoarthritis of left knee     Recommend MRI left knee  This patient may benefit from arthroscopic surgery of his left knee. We need to rule out a meniscal tear as a cause of his recurrent pain and effusion and if there is no meniscal tear then he may need an osteotomy of his tibia depending on that arthroscopic evaluation  Currently continue ice as needed Continue naproxen

## 2016-11-03 NOTE — Patient Instructions (Signed)
Continue ice and naproxen as needed use bracing as needed  Follow-up after MRI

## 2016-11-03 NOTE — Addendum Note (Signed)
Addended by: Baldomero Lamy B on: 11/03/2016 09:16 AM   Modules accepted: Orders

## 2016-11-09 ENCOUNTER — Ambulatory Visit (HOSPITAL_COMMUNITY)
Admission: RE | Admit: 2016-11-09 | Discharge: 2016-11-09 | Disposition: A | Discharge status: Home / Self Care | Payer: Medicaid Other | Source: Ambulatory Visit | Attending: Orthopedic Surgery | Admitting: Orthopedic Surgery

## 2016-11-09 DIAGNOSIS — M1712 Unilateral primary osteoarthritis, left knee: Secondary | ICD-10-CM | POA: Insufficient documentation

## 2016-11-09 DIAGNOSIS — M23204 Derangement of unspecified medial meniscus due to old tear or injury, left knee: Secondary | ICD-10-CM | POA: Insufficient documentation

## 2016-11-17 ENCOUNTER — Ambulatory Visit (INDEPENDENT_AMBULATORY_CARE_PROVIDER_SITE_OTHER): Payer: Medicaid Other | Admitting: Orthopedic Surgery

## 2016-11-17 ENCOUNTER — Telehealth: Payer: Self-pay | Admitting: Orthopedic Surgery

## 2016-11-17 DIAGNOSIS — M25562 Pain in left knee: Secondary | ICD-10-CM | POA: Diagnosis not present

## 2016-11-17 DIAGNOSIS — M1712 Unilateral primary osteoarthritis, left knee: Secondary | ICD-10-CM | POA: Diagnosis not present

## 2016-11-17 DIAGNOSIS — M23204 Derangement of unspecified medial meniscus due to old tear or injury, left knee: Secondary | ICD-10-CM

## 2016-11-17 DIAGNOSIS — M25462 Effusion, left knee: Secondary | ICD-10-CM | POA: Diagnosis not present

## 2016-11-17 DIAGNOSIS — G8929 Other chronic pain: Secondary | ICD-10-CM

## 2016-11-17 MED ORDER — TRAMADOL HCL 50 MG PO TABS: 50.0000 mg | ORAL_TABLET | Freq: Four times a day (QID) | ORAL | 0 refills | Status: DC | PRN

## 2016-11-17 NOTE — Patient Instructions (Addendum)
You have decided to proceed with operative arthroscopy of the knee. You have decided not to continue with nonoperative measures such as but not limited to oral medication, weight loss, activity modification, physical therapy, bracing, or injection.  We will perform operative arthroscopy of the knee. Some of the risks associated with arthroscopic surgery of the knee include but are not limited to Bleeding Infection Swelling Stiffness Blood clot Pain  If you're not comfortable with these risks and would like to continue with nonoperative treatment please let Dr. Aline Brochure know prior to your surgery.  Knee Arthroscopy Knee arthroscopy is a surgical procedure that is used to examine the inside of your knee joint and repair any damage. The surgeon puts a small, lighted instrument with a camera on the tip (arthroscope) through a small incision in your knee. The camera sends pictures to a monitor in the operating room. Your surgeon uses those pictures to guide the surgical instruments through other incisions to the area of damage. Knee arthroscopy can be used to treat many types of knee problems. It may be used:  To repair a torn ligament.  To repair or remove damaged tissue.  To remove a fluid-filled sac (cyst) from your knee.  Tell a health care provider about:  Any allergies you have.  All medicines you are taking, including vitamins, herbs, eye drops, creams, and over-the-counter medicines.  Any problems you or family members have had with anesthetic medicines.  Any blood disorders you have.  Any surgeries you have had.  Any medical conditions you have. What are the risks? Generally, this is a safe procedure. However, problems may occur, including:  Infection.  Bleeding.  Damage to blood vessels, nerves, or structures of your knee.  A blood clot that forms in your leg and travels to your lung.  Failure to relieve symptoms.  What happens before the procedure?  Ask your  health care provider about: ? Changing or stopping your regular medicines. This is especially important if you are taking diabetes medicines or blood thinners. ? Taking medicines such as aspirin and ibuprofen. These medicines can thin your blood. Do not take these medicines before your procedure if your health care provider instructs you not to.  Follow your health care provider's instructions about eating or drinking restrictions.  Plan to have someone take you home after the procedure.  If you go home right after the procedure, plan to have someone with you for 24 hours.  Do not drink alcohol unless your health care provider says that you can.  Do not use any tobacco products, including cigarettes, chewing tobacco, or electronic cigarettes unless your health care provider says that you can. If you need help quitting, ask your health care provider.  You may have a physical exam. What happens during the procedure?  An IV tube will be inserted into one of your veins.  You will be given one or more of the following: ? A medicine that helps you relax (sedative). ? A medicine that numbs the area (local anesthetic). ? A medicine that makes you fall asleep (general anesthetic). ? A medicine that is injected into your spine that numbs the area below and slightly above the injection site (spinal anesthetic). ? A medicine that is injected into an area of your body that numbs everything below the injection site (regional anesthetic).  A cuff may be placed around your upper leg to slow bleeding during the procedure.  The surgeon will make a small number of incisions around your  knee.  Your knee joint will be flushed and filled with a germ-free (sterile) solution.  The arthroscope will be passed through an incision into your knee joint.  More instruments will be passed through other incisions to repair your knee as needed.  The fluid will be removed from your knee.  The incisions will be  closed with adhesive strips or stitches (sutures).  A bandage (dressing) will be placed over your knee. The procedure may vary among health care providers and hospitals. What happens after the procedure?  Your blood pressure, heart rate, breathing rate and blood oxygen level will be monitored often until the medicines you were given have worn off.  You may be given medicine for pain.  You may get crutches to help you walk without using your knee to support your body weight.  You may have to wear compression stockings. These stocking help to prevent blood clots and reduce swelling in your legs. This information is not intended to replace advice given to you by your health care provider. Make sure you discuss any questions you have with your health care provider. Document Released: 03/24/2000 Document Revised: 09/02/2015 Document Reviewed: 03/23/2014 Elsevier Interactive Patient Education  2017 [employer].

## 2016-11-17 NOTE — Progress Notes (Signed)
Patient ID: Jamie [name], male   DOB: [birth year], 46 y.o.   MRN: [id number]  MRI FOLLOW UP  Chief Complaint  Patient presents with  . Results    Review MRI scan left knee  . Knee Pain    left knee    HPI Jamie [name] is a 46 y.o. male.    The patient has had MRI of the Left knee  Jamie [name] 46 years old follow him for several years he's had chronic swelling and pain in his left knee which we treated with aspiration injection cortisone Naprosyn. Because he was still having such discomfort and recurrent swelling coming every couple of weeks to get it drained we sent him for an MRI see he's here for follow-up  The MRI shows torn medial meniscus in the setting of osteoarthritis  I discussed with him the possibility of arthroscopic surgery and he agrees.    Review of Systems  Respiratory: Negative for shortness of breath.   Cardiovascular: Negative for chest pain.    Physical Exam  He is awake alert and oriented 3 his mood and affect is normal his appearance is normal his body habitus is medium frame is ambulatory status shows no obvious limping   He has a left knee effusion tenderness along the medial joint line and varus alignment. He does have full flexion and extension of his left knee.   No ligamentous instability is detected. Motor exam is normal skin is clean dry and intact sensation is normal in color and capillary refill is normal without peripheral edema.   He has a well-healed incision over the right knee from prior surgery ; maintained full range of motion  Data  My Independent image interpretation of the MRI showed was osteoarthritis with torn medial meniscus  The report was read as follows  IMPRESSION: 1. Examination is degraded by motion artifact, and the patient was not able to complete the study. 2. Tricompartmental degenerative changes. No acute osseous findings. 3. Prominent degenerative signal in the medial meniscus with suspicion of an oblique  degenerative tear in the posterior horn. Meniscal evaluation limited by motion. 4. The cruciate and collateral ligaments are intact.     Electronically Signed   By: Richardean Sale M.D.   On: 11/10/2016 07:07    The plan is to arthroscopy left knee with partial medial meniscectomy Arther Abbott, MD 11/17/2016 9:03 AM

## 2016-11-17 NOTE — Telephone Encounter (Signed)
Jamie [name] states he forgot to ask you for pain medication when he was here this morning.    Would you write a prescription for him please?  Thanks

## 2016-11-20 NOTE — Addendum Note (Signed)
Addended by: Carole Civil on: 11/20/2016 09:25 PM   Modules accepted: Orders, SmartSet

## 2016-11-30 NOTE — Patient Instructions (Signed)
[name]  11/30/2016     @PREFPERIOPPHARMACY @   Your procedure is scheduled on  12/07/2016 .  Report to [name] at  615  A.M.  Call this number if you have problems the morning of surgery:  [phone]   Remember:  Do not eat food or drink liquids after midnight.  Take these medicines the morning of surgery with A SIP OF WATER  Klonopin, mavyret, ultram.   Do not wear jewelry, make-up or nail polish.  Do not wear lotions, powders, or perfumes, or deoderant.  Do not shave 48 hours prior to surgery.  Men may shave face and neck.  Do not bring valuables to the hospital.  [facility] is not responsible for any belongings or valuables.  Contacts, dentures or bridgework may not be worn into surgery.  Leave your suitcase in the car.  After surgery it may be brought to your room.  For patients admitted to the hospital, discharge time will be determined by your treatment team.  Patients discharged the day of surgery will not be allowed to drive home.   Name and phone number of your driver:   Family  Special instructions:  None  Please read over the following fact sheets that you were given. Anesthesia Post-op Instructions and Care and Recovery After Surgery      Knee Ligament Injury, Arthroscopy Arthroscopy is a surgical technique in which your health care provider examines your knee through a small, pencil-sized telescope (arthroscope). Often, repairs to injured ligaments can be done with instruments in the arthroscope. Arthroscopy is less invasive than open-knee surgery. Tell a health care provider about:  Any allergies you have.  All medicines you are taking, including vitamins, herbs, eye drops, creams, and over-the-counter medicines.  Any problems you or family members have had with anesthetic medicines.  Any blood disorders you have.  Any surgeries you have had.  Any medical conditions you have. What are the risks? Generally, this is a safe  procedure. However, as with any procedure, problems can occur. Possible problems include:  Infection.  Bleeding.  Stiffness.  What happens before the procedure?  Ask your health care provider about changing or stopping any regular medicines. Avoid taking aspirin or blood thinners as directed by your health care provider.  Do not eat or drink anything after midnight the night before surgery.  If you smoke, do not smoke for at least 2 weeks before your surgery.  Do not drink alcohol starting the day before your surgery.  Let your health care provider know if you develop a cold or any infection before your surgery.  Arrange for someone to drive you home after the surgery or after your hospital stay. Also arrange for someone to help you with activities during recovery. What happens during the procedure?  Small monitors will be put on your body. They are used to check your heart, blood pressure, and oxygen levels.  An IV access tube will be put into one of your veins. Medicine will be able to flow directly into your body through this IV tube.  You might be given a medicine to help you relax (sedative).  You will be given a medicine that makes you go to sleep (general anesthetic), and a breathing tube will be placed into your lungs during the procedure.  Several small incisions are made in your knee. Saline fluid is placed into one of the incisions to expand the knee and clear away any  blood in the knee.  Your health care provider will insert the arthroscope to examine the injured knee.  During arthroscopy, your health care provider may find a partial or complete tear in a ligament.  Tools can be inserted through the other incisions to repair the injured ligaments.  The incisions are then closed with absorbable stitches and covered with dressings. What happens after the procedure?  You will be taken to the recovery area where you will be monitored.  When you are awake, stable,  and taking fluids without problems, you will be allowed to go home. This information is not intended to replace advice given to you by your health care provider. Make sure you discuss any questions you have with your health care provider. Document Released: 03/24/2000 Document Revised: 09/02/2015 Document Reviewed: 11/06/2012 Elsevier Interactive Patient Education  2017 [place].  Arthroscopic Knee Ligament Repair, Care After This sheet gives you information about how to care for yourself after your procedure. Your health care provider may also give you more specific instructions. If you have problems or questions, contact your health care provider. What can I expect after the procedure? After the procedure, it is common to have:  Pain in your knee.  Bruising and swelling on your knee, calf, and ankle for 3-4 days.  Fatigue.  Follow these instructions at home: If you have a brace or immobilizer:  Wear the brace or immobilizer as told by your health care provider. Remove it only as told by your health care provider.  Loosen the splint or immobilizer if your toes tingle, become numb, or turn cold and blue.  Keep the brace or immobilizer clean. Bathing  Do not take baths, swim, or use a hot tub until your health care provider approves. Ask your health care provider if you can take showers.  Keep your bandage (dressing) dry until your health care provider says that it can be removed. Cover it and your brace or immobilizer with a watertight covering when you take a shower. Incision care  Follow instructions from your health care provider about how to take care of your incision. Make sure you: ? Wash your hands with soap and water before you change your bandage (dressing). If soap and water are not available, use hand sanitizer. ? Change your dressing as told by your health care provider. ? Leave stitches (sutures), skin glue, or adhesive strips in place. These skin closures may need  to stay in place for 2 weeks or longer. If adhesive strip edges start to loosen and curl up, you may trim the loose edges. Do not remove adhesive strips completely unless your health care provider tells you to do that.  Check your incision area every day for signs of infection. Check for: ? More redness, swelling, or pain. ? More fluid or blood. ? Warmth. ? Pus or a bad smell. Managing pain, stiffness, and swelling  If directed, put ice on the affected area. ? If you have a removable brace or immobilizer, remove it as told by your health care provider. ? Put ice in a plastic bag. ? Place a towel between your skin and the bag or between your brace or immobilizer and the bag. ? Leave the ice on for 20 minutes, 2-3 times a day.  Move your toes often to avoid stiffness and to lessen swelling.  Raise (elevate) the injured area above the level of your heart while you are sitting or lying down. Driving  Do not drive until your health  care provider approves. If you have a brace or immobilizer on your leg, ask your health care provider when it is safe for you to drive.  Do not drive or use heavy machinery while taking prescription pain medicine. Activity  Rest as directed. Ask your health care provider what activities are safe for you.  Do physical therapy exercises as told by your health care provider. Physical therapy will help you regain strength and motion in your knee.  Follow instructions from your health care provider about: ? When you may start motion exercises. ? When you may start riding a stationary bike and doing other low-impact activities. ? When you may start to jog and do other high-impact activities. Safety  Do not use the injured limb to support your body weight until your health care provider says that you can. Use crutches as told by your health care provider. General instructions  Do not use any products that contain nicotine or tobacco, such as cigarettes and  e-cigarettes. These can delay bone healing. If you need help quitting, ask your health care provider.  To prevent or treat constipation while you are taking prescription pain medicine, your health care provider may recommend that you: ? Drink enough fluid to keep your urine clear or pale yellow. ? Take over-the-counter or prescription medicines. ? Eat foods that are high in fiber, such as fresh fruits and vegetables, whole grains, and beans. ? Limit foods that are high in fat and processed sugars, such as fried and sweet foods.  Take over-the-counter and prescription medicines only as told by your health care provider.  Keep all follow-up visits as told by your health care provider. This is important. Contact a health care provider if:  You have more redness, swelling, or pain around an incision.  You have more fluid or blood coming from an incision.  Your incision feels warm to the touch.  You have a fever.  You have pain or swelling in your knee, and it gets worse.  You have pain that does not get better with medicine. Get help right away if:  You have trouble breathing.  You have pus or a bad smell coming from an incision.  You have numbness and tingling near the knee joint. Summary  After the procedure, it is common to have knee pain with bruising and swelling on your knee, calf, and ankle.  Icing your knee and raising your leg above the level of your heart will help control the pain and the swelling.  Do physical therapy exercises as told by your health care provider. Physical therapy will help you regain strength and motion in your knee. This information is not intended to replace advice given to you by your health care provider. Make sure you discuss any questions you have with your health care provider. Document Released: 01/15/2013 Document Revised: 03/21/2016 Document Reviewed: 03/21/2016 Elsevier Interactive Patient Education  2017 [place]  Anesthesia, Adult General anesthesia is the use of medicines to make a person "go to sleep" (be unconscious) for a medical procedure. General anesthesia is often recommended when a procedure:  Is long.  Requires you to be still or in an unusual position.  Is major and can cause you to lose blood.  Is impossible to do without general anesthesia.  The medicines used for general anesthesia are called general anesthetics. In addition to making you sleep, the medicines:  Prevent pain.  Control your blood pressure.  Relax your muscles.  Tell a health  care provider about:  Any allergies you have.  All medicines you are taking, including vitamins, herbs, eye drops, creams, and over-the-counter medicines.  Any problems you or family members have had with anesthetic medicines.  Types of anesthetics you have had in the past.  Any bleeding disorders you have.  Any surgeries you have had.  Any medical conditions you have.  Any history of heart or lung conditions, such as heart failure, sleep apnea, or chronic obstructive pulmonary disease (COPD).  Whether you are pregnant or may be pregnant.  Whether you use tobacco, alcohol, marijuana, or street drugs.  Any history of Armed forces logistics/support/administrative officer.  Any history of depression or anxiety. What are the risks? Generally, this is a safe procedure. However, problems may occur, including:  Allergic reaction to anesthetics.  Lung and heart problems.  Inhaling food or liquids from your stomach into your lungs (aspiration).  Injury to nerves.  Waking up during your procedure and being unable to move (rare).  Extreme agitation or a state of mental confusion (delirium) when you wake up from the anesthetic.  Air in the bloodstream, which can lead to stroke.  These problems are more likely to develop if you are having a major surgery or if you have an advanced medical condition. You can prevent some of these complications by answering all of  your health care provider's questions thoroughly and by following all pre-procedure instructions. General anesthesia can cause side effects, including:  Nausea or vomiting  A sore throat from the breathing tube.  Feeling cold or shivery.  Feeling tired, washed out, or achy.  Sleepiness or drowsiness.  Confusion or agitation.  What happens before the procedure? Staying hydrated Follow instructions from your health care provider about hydration, which may include:  Up to 2 hours before the procedure - you may continue to drink clear liquids, such as water, clear fruit juice, black coffee, and plain tea.  Eating and drinking restrictions Follow instructions from your health care provider about eating and drinking, which may include:  8 hours before the procedure - stop eating heavy meals or foods such as meat, fried foods, or fatty foods.  6 hours before the procedure - stop eating light meals or foods, such as toast or cereal.  6 hours before the procedure - stop drinking milk or drinks that contain milk.  2 hours before the procedure - stop drinking clear liquids.  Medicines  Ask your health care provider about: ? Changing or stopping your regular medicines. This is especially important if you are taking diabetes medicines or blood thinners. ? Taking medicines such as aspirin and ibuprofen. These medicines can thin your blood. Do not take these medicines before your procedure if your health care provider instructs you not to. ? Taking new dietary supplements or medicines. Do not take these during the week before your procedure unless your health care provider approves them.  If you are told to take a medicine or to continue taking a medicine on the day of the procedure, take the medicine with sips of water. General instructions   Ask if you will be going home the same day, the following day, or after a longer hospital stay. ? Plan to have someone take you home. ? Plan to  have someone stay with you for the first 24 hours after you leave the hospital or clinic.  For 3-6 weeks before the procedure, try not to use any tobacco products, such as cigarettes, chewing tobacco, and e-cigarettes.  You may  brush your teeth on the morning of the procedure, but make sure to spit out the toothpaste. What happens during the procedure?  You will be given anesthetics through a mask and through an IV tube in one of your veins.  You may receive medicine to help you relax (sedative).  As soon as you are asleep, a breathing tube may be used to help you breathe.  An anesthesia specialist will stay with you throughout the procedure. He or she will help keep you comfortable and safe by continuing to give you medicines and adjusting the amount of medicine that you get. He or she will also watch your blood pressure, pulse, and oxygen levels to make sure that the anesthetics do not cause any problems.  If a breathing tube was used to help you breathe, it will be removed before you wake up. The procedure may vary among health care providers and hospitals. What happens after the procedure?  You will wake up, often slowly, after the procedure is complete, usually in a recovery area.  Your blood pressure, heart rate, breathing rate, and blood oxygen level will be monitored until the medicines you were given have worn off.  You may be given medicine to help you calm down if you feel anxious or agitated.  If you will be going home the same day, your health care provider may check to make sure you can stand, drink, and urinate.  Your health care providers will treat your pain and side effects before you go home.  Do not drive for 24 hours if you received a sedative.  You may: ? Feel nauseous and vomit. ? Have a sore throat. ? Have mental slowness. ? Feel cold or shivery. ? Feel sleepy. ? Feel tired. ? Feel sore or achy, even in parts of your body where you did not have  surgery. This information is not intended to replace advice given to you by your health care provider. Make sure you discuss any questions you have with your health care provider. Document Released: 07/04/2007 Document Revised: 09/07/2015 Document Reviewed: 03/11/2015 Elsevier Interactive Patient Education  2018 [place] Anesthesia, Adult, Care After These instructions provide you with information about caring for yourself after your procedure. Your health care provider may also give you more specific instructions. Your treatment has been planned according to current medical practices, but problems sometimes occur. Call your health care provider if you have any problems or questions after your procedure. What can I expect after the procedure? After the procedure, it is common to have:  Vomiting.  A sore throat.  Mental slowness.  It is common to feel:  Nauseous.  Cold or shivery.  Sleepy.  Tired.  Sore or achy, even in parts of your body where you did not have surgery.  Follow these instructions at home: For at least 24 hours after the procedure:  Do not: ? Participate in activities where you could fall or become injured. ? Drive. ? Use heavy machinery. ? Drink alcohol. ? Take sleeping pills or medicines that cause drowsiness. ? Make important decisions or sign legal documents. ? Take care of children on your own.  Rest. Eating and drinking  If you vomit, drink water, juice, or soup when you can drink without vomiting.  Drink enough fluid to keep your urine clear or pale yellow.  Make sure you have little or no nausea before eating solid foods.  Follow the diet recommended by your health care provider. General instructions  Have  a responsible adult stay with you until you are awake and alert.  Return to your normal activities as told by your health care provider. Ask your health care provider what activities are safe for you.  Take over-the-counter  and prescription medicines only as told by your health care provider.  If you smoke, do not smoke without supervision.  Keep all follow-up visits as told by your health care provider. This is important. Contact a health care provider if:  You continue to have nausea or vomiting at home, and medicines are not helpful.  You cannot drink fluids or start eating again.  You cannot urinate after 8-12 hours.  You develop a skin rash.  You have fever.  You have increasing redness at the site of your procedure. Get help right away if:  You have difficulty breathing.  You have chest pain.  You have unexpected bleeding.  You feel that you are having a life-threatening or urgent problem. This information is not intended to replace advice given to you by your health care provider. Make sure you discuss any questions you have with your health care provider. Document Released: 07/03/2000 Document Revised: 08/30/2015 Document Reviewed: 03/11/2015 Elsevier Interactive Patient Education  [employer].

## 2016-12-04 ENCOUNTER — Encounter (HOSPITAL_COMMUNITY): Payer: Self-pay

## 2016-12-04 ENCOUNTER — Encounter (HOSPITAL_COMMUNITY)
Admission: RE | Admit: 2016-12-04 | Discharge: 2016-12-04 | Disposition: A | Discharge status: Home / Self Care | Payer: Medicaid Other | Source: Ambulatory Visit | Attending: Orthopedic Surgery | Admitting: Orthopedic Surgery

## 2016-12-04 DIAGNOSIS — Z01810 Encounter for preprocedural cardiovascular examination: Secondary | ICD-10-CM | POA: Insufficient documentation

## 2016-12-04 HISTORY — DX: Prediabetes: R73.03

## 2016-12-04 HISTORY — DX: Unspecified osteoarthritis, unspecified site: M19.90

## 2016-12-04 LAB — SURGICAL PCR SCREEN
MRSA, PCR: NEGATIVE
Staphylococcus aureus: NEGATIVE

## 2016-12-06 NOTE — H&P (Signed)
Patient ID: Jamie [name], male   DOB: October 04, 1970, 46 y.o.   MRN: [id number]   MRI FOLLOW UP       Chief Complaint  Patient presents with  . Results      Review MRI scan left knee  . Knee Pain      left knee      HPI Jamie [name] is a 46 y.o. male.     The patient has had MRI of the Left knee   Jamie [name] 46 years old follow him for several years he's had chronic swelling and pain in his left knee which we treated with aspiration injection cortisone Naprosyn. Because he was still having such discomfort and recurrent swelling coming every couple of weeks to get it drained we sent him for an MRI see he's here for follow-up   The MRI shows torn medial meniscus in the setting of osteoarthritis   I discussed with him the possibility of arthroscopic surgery and he agrees.     Review of Systems  Respiratory: Negative for shortness of breath.   Cardiovascular: Negative for chest pain.      Past Medical History:  Diagnosis Date  . Anxiety   . Arthritis   . GERD (gastroesophageal reflux disease)    occ  . HCV antibody positive   . Hypertension    "Dr Karie Kirks took me off meds"  diet control  . Pre-diabetes    Past Surgical History:  Procedure Laterality Date  . BACK SURGERY    . CARPAL TUNNEL RELEASE Right 06/25/2013   Procedure: CARPAL TUNNEL RELEASE;  Surgeon: Carole Civil, MD;  Location: [facility];  Service: Orthopedics;  Laterality: Right;  . CARPAL TUNNEL RELEASE Left 07/25/2013   Procedure: LEFT CARPAL TUNNEL RELEASE;  Surgeon: Carole Civil, MD;  Location: [facility];  Service: Orthopedics;  Laterality: Left;  . ELBOW SURGERY Left   . FOOT SURGERY    . HERNIA REPAIR Right    inguinal- age 29  . KNEE SURGERY    . left elbow    . LUMBAR LAMINECTOMY/DECOMPRESSION MICRODISCECTOMY Left 10/06/2013   Procedure: Left Lumbar Three-four microdiskectomy;  Surgeon: Ophelia Charter, MD;  Location: [facility];  Service: Neurosurgery;  Laterality: Left;  Left Lumbar  Three-four microdiskectomy  . right foot     forgein body removal  . right knee  orif right patella Ke[place] 1993  . SEPTOPLASTY     Family History  Problem Relation Age of Onset  . Heart disease Unknown   . Arthritis Unknown   . Cancer Unknown   . Asthma Unknown   . Diabetes Unknown   . Kidney disease Unknown   . Colon cancer Father    Social History  Substance Use Topics  . Smoking status: Former Smoker    Packs/day: 1.00    Years: 15.00    Types: Cigarettes    Quit date: 12/21/2012  . Smokeless tobacco: Former Systems developer    Types: Chew    Quit date: 12/05/1990  . Alcohol use No    Physical Exam  He is awake alert and oriented 3 his mood and affect is normal his appearance is normal his body habitus is medium frame is ambulatory status shows no obvious limping   He has a left knee effusion tenderness along the medial joint line and varus alignment. He does have full flexion and extension of his left knee.   No ligamentous instability is detected. Motor exam is normal skin is  clean dry and intact sensation is normal in color and capillary refill is normal without peripheral edema.   He has a well-healed incision over the right knee from prior surgery ; maintained full range of motion   Data   My Independent image interpretation of the MRI showed was osteoarthritis with torn medial meniscus   The report was read as follows  IMPRESSION: 1. Examination is degraded by motion artifact, and the patient was not able to complete the study. 2. Tricompartmental degenerative changes. No acute osseous findings. 3. Prominent degenerative signal in the medial meniscus with suspicion of an oblique degenerative tear in the posterior horn. Meniscal evaluation limited by motion. 4. The cruciate and collateral ligaments are intact.     Electronically Signed   By: Richardean Sale M.D.   On: 11/10/2016 07:07       The plan is to arthroscopy left knee with partial medial  meniscectomy Arther Abbott, MD

## 2016-12-07 ENCOUNTER — Ambulatory Visit (HOSPITAL_COMMUNITY): Payer: Medicaid Other | Admitting: Anesthesiology

## 2016-12-07 ENCOUNTER — Encounter (HOSPITAL_COMMUNITY): Payer: Self-pay | Admitting: Anesthesiology

## 2016-12-07 ENCOUNTER — Encounter (HOSPITAL_COMMUNITY)
Admission: RE | Disposition: A | Discharge status: Home / Self Care | Payer: Self-pay | Source: Ambulatory Visit | Attending: Orthopedic Surgery

## 2016-12-07 ENCOUNTER — Ambulatory Visit (HOSPITAL_COMMUNITY)
Admission: RE | Admit: 2016-12-07 | Discharge: 2016-12-07 | Disposition: A | Discharge status: Home / Self Care | Payer: Medicaid Other | Source: Ambulatory Visit | Attending: Orthopedic Surgery | Admitting: Orthopedic Surgery

## 2016-12-07 DIAGNOSIS — R7303 Prediabetes: Secondary | ICD-10-CM | POA: Diagnosis not present

## 2016-12-07 DIAGNOSIS — M23322 Other meniscus derangements, posterior horn of medial meniscus, left knee: Secondary | ICD-10-CM | POA: Diagnosis not present

## 2016-12-07 DIAGNOSIS — M25462 Effusion, left knee: Secondary | ICD-10-CM | POA: Diagnosis not present

## 2016-12-07 DIAGNOSIS — M23222 Derangement of posterior horn of medial meniscus due to old tear or injury, left knee: Secondary | ICD-10-CM | POA: Diagnosis present

## 2016-12-07 DIAGNOSIS — M2242 Chondromalacia patellae, left knee: Secondary | ICD-10-CM | POA: Insufficient documentation

## 2016-12-07 DIAGNOSIS — K219 Gastro-esophageal reflux disease without esophagitis: Secondary | ICD-10-CM | POA: Diagnosis not present

## 2016-12-07 DIAGNOSIS — M1712 Unilateral primary osteoarthritis, left knee: Secondary | ICD-10-CM | POA: Insufficient documentation

## 2016-12-07 DIAGNOSIS — M94262 Chondromalacia, left knee: Secondary | ICD-10-CM

## 2016-12-07 DIAGNOSIS — F419 Anxiety disorder, unspecified: Secondary | ICD-10-CM | POA: Insufficient documentation

## 2016-12-07 DIAGNOSIS — R001 Bradycardia, unspecified: Secondary | ICD-10-CM | POA: Diagnosis not present

## 2016-12-07 DIAGNOSIS — I10 Essential (primary) hypertension: Secondary | ICD-10-CM | POA: Insufficient documentation

## 2016-12-07 DIAGNOSIS — Z87891 Personal history of nicotine dependence: Secondary | ICD-10-CM | POA: Diagnosis not present

## 2016-12-07 HISTORY — PX: KNEE ARTHROSCOPY WITH MEDIAL MENISECTOMY: SHX5651

## 2016-12-07 SURGERY — ARTHROSCOPY, KNEE, WITH MEDIAL MENISCECTOMY
Anesthesia: General | Site: Knee | Laterality: Left

## 2016-12-07 MED ORDER — LIDOCAINE HCL (PF) 1 % IJ SOLN
INTRAMUSCULAR | Status: AC
  Filled 2016-12-07: qty 5

## 2016-12-07 MED ORDER — PROPOFOL 10 MG/ML IV BOLUS
INTRAVENOUS | Status: DC | PRN
  Administered 2016-12-07: 200 mg via INTRAVENOUS

## 2016-12-07 MED ORDER — FENTANYL CITRATE (PF) 100 MCG/2ML IJ SOLN
INTRAMUSCULAR | Status: DC | PRN
  Administered 2016-12-07: 75 ug via INTRAVENOUS
  Administered 2016-12-07: 50 ug via INTRAVENOUS
  Administered 2016-12-07: 25 ug via INTRAVENOUS

## 2016-12-07 MED ORDER — HYDROMORPHONE HCL 1 MG/ML IJ SOLN: 0.2500 mg | INTRAMUSCULAR | Status: DC | PRN

## 2016-12-07 MED ORDER — HYDROCODONE-ACETAMINOPHEN 5-325 MG PO TABS: 1.0000 | ORAL_TABLET | ORAL | 0 refills | Status: DC | PRN

## 2016-12-07 MED ORDER — KETOROLAC TROMETHAMINE 30 MG/ML IJ SOLN
30.0000 mg | Freq: Once | INTRAMUSCULAR | Status: AC
  Administered 2016-12-07: 30 mg via INTRAVENOUS
  Filled 2016-12-07: qty 1

## 2016-12-07 MED ORDER — POVIDONE-IODINE 10 % EX SWAB: 2.0000 "application " | Freq: Once | CUTANEOUS | Status: DC

## 2016-12-07 MED ORDER — LIDOCAINE HCL (CARDIAC) 10 MG/ML IV SOLN
INTRAVENOUS | Status: DC | PRN
  Administered 2016-12-07: 50 mg via INTRAVENOUS
  Administered 2016-12-07: 40 mg via INTRAVENOUS

## 2016-12-07 MED ORDER — IBUPROFEN 800 MG PO TABS
800.0000 mg | ORAL_TABLET | Freq: Once | ORAL | Status: AC
  Administered 2016-12-07: 800 mg via ORAL
  Filled 2016-12-07: qty 1

## 2016-12-07 MED ORDER — BUPIVACAINE-EPINEPHRINE (PF) 0.5% -1:200000 IJ SOLN
INTRAMUSCULAR | Status: AC
  Filled 2016-12-07: qty 60

## 2016-12-07 MED ORDER — OXYCODONE HCL 5 MG/5ML PO SOLN: 5.0000 mg | Freq: Once | ORAL | Status: DC | PRN

## 2016-12-07 MED ORDER — BUPIVACAINE-EPINEPHRINE (PF) 0.5% -1:200000 IJ SOLN
INTRAMUSCULAR | Status: DC | PRN
  Administered 2016-12-07: 60 mL

## 2016-12-07 MED ORDER — LORAZEPAM 2 MG/ML IJ SOLN
1.0000 mg | Freq: Once | INTRAMUSCULAR | Status: AC
  Administered 2016-12-07: 1 mg via INTRAVENOUS

## 2016-12-07 MED ORDER — ACETAMINOPHEN 325 MG PO TABS
650.0000 mg | ORAL_TABLET | Freq: Four times a day (QID) | ORAL | Status: DC | PRN
  Administered 2016-12-07: 650 mg via ORAL

## 2016-12-07 MED ORDER — CEFAZOLIN SODIUM-DEXTROSE 2-4 GM/100ML-% IV SOLN
2.0000 g | INTRAVENOUS | Status: AC
  Administered 2016-12-07: 2 g via INTRAVENOUS
  Filled 2016-12-07: qty 100

## 2016-12-07 MED ORDER — ONDANSETRON 4 MG PO TBDP
4.0000 mg | ORAL_TABLET | Freq: Once | ORAL | Status: AC
  Administered 2016-12-07: 4 mg via ORAL

## 2016-12-07 MED ORDER — PROPOFOL 10 MG/ML IV BOLUS
INTRAVENOUS | Status: AC
  Filled 2016-12-07: qty 40

## 2016-12-07 MED ORDER — EPINEPHRINE PF 1 MG/ML IJ SOLN
INTRAMUSCULAR | Status: DC | PRN
  Administered 2016-12-07 (×3): 3000 mL

## 2016-12-07 MED ORDER — FENTANYL CITRATE (PF) 250 MCG/5ML IJ SOLN
INTRAMUSCULAR | Status: AC
  Filled 2016-12-07: qty 5

## 2016-12-07 MED ORDER — LORAZEPAM 2 MG/ML IJ SOLN
INTRAMUSCULAR | Status: AC
  Filled 2016-12-07: qty 1

## 2016-12-07 MED ORDER — ONDANSETRON HCL 4 MG/2ML IJ SOLN
4.0000 mg | Freq: Once | INTRAMUSCULAR | Status: AC
  Administered 2016-12-07: 4 mg via INTRAVENOUS
  Filled 2016-12-07: qty 2

## 2016-12-07 MED ORDER — ACETAMINOPHEN 325 MG PO TABS
ORAL_TABLET | ORAL | Status: AC
  Filled 2016-12-07: qty 2

## 2016-12-07 MED ORDER — ONDANSETRON 4 MG PO TBDP
ORAL_TABLET | ORAL | Status: AC
  Filled 2016-12-07: qty 1

## 2016-12-07 MED ORDER — LACTATED RINGERS IV SOLN
INTRAVENOUS | Status: DC
  Administered 2016-12-07: 07:00:00 via INTRAVENOUS

## 2016-12-07 MED ORDER — CHLORHEXIDINE GLUCONATE 4 % EX LIQD: 60.0000 mL | Freq: Once | CUTANEOUS | Status: DC

## 2016-12-07 MED ORDER — OXYCODONE HCL 5 MG PO TABS: 5.0000 mg | ORAL_TABLET | Freq: Once | ORAL | Status: DC | PRN

## 2016-12-07 MED ORDER — HYDROCODONE-ACETAMINOPHEN 7.5-325 MG PO TABS
1.0000 | ORAL_TABLET | Freq: Once | ORAL | Status: AC
  Administered 2016-12-07: 1 via ORAL
  Filled 2016-12-07: qty 1

## 2016-12-07 SURGICAL SUPPLY — 48 items
BAG HAMPER (MISCELLANEOUS) ×3 IMPLANT
BANDAGE ELASTIC 6 LF NS (GAUZE/BANDAGES/DRESSINGS) ×3 IMPLANT
BLADE AGGRESSIVE PLUS 4.0 (BLADE) ×3 IMPLANT
BLADE SURG SZ11 CARB STEEL (BLADE) ×3 IMPLANT
CHLORAPREP W/TINT 26ML (MISCELLANEOUS) ×3 IMPLANT
CLOTH BEACON ORANGE TIMEOUT ST (SAFETY) ×3 IMPLANT
COOLER CRYO IC GRAV AND TUBE (ORTHOPEDIC SUPPLIES) ×3 IMPLANT
CUFF CRYO KNEE18X23 MED (MISCELLANEOUS) ×3 IMPLANT
CUFF TOURNIQUET SINGLE 34IN LL (TOURNIQUET CUFF) ×3 IMPLANT
CUTTER ANGLED DBL BITE 4.5 (BURR) IMPLANT
DECANTER SPIKE VIAL GLASS SM (MISCELLANEOUS) ×6 IMPLANT
GAUZE SPONGE 4X4 12PLY STRL (GAUZE/BANDAGES/DRESSINGS) ×3 IMPLANT
GAUZE SPONGE 4X4 16PLY XRAY LF (GAUZE/BANDAGES/DRESSINGS) ×3 IMPLANT
GAUZE XEROFORM 5X9 LF (GAUZE/BANDAGES/DRESSINGS) ×3 IMPLANT
GLOVE BIO SURGEON STRL SZ7 (GLOVE) ×3 IMPLANT
GLOVE BIOGEL PI IND STRL 7.0 (GLOVE) ×1 IMPLANT
GLOVE BIOGEL PI INDICATOR 7.0 (GLOVE) ×2
GLOVE SKINSENSE NS SZ8.0 LF (GLOVE) ×2
GLOVE SKINSENSE STRL SZ8.0 LF (GLOVE) ×1 IMPLANT
GLOVE SS N UNI LF 8.5 STRL (GLOVE) ×3 IMPLANT
GOWN STRL REUS W/ TWL LRG LVL3 (GOWN DISPOSABLE) ×1 IMPLANT
GOWN STRL REUS W/TWL LRG LVL3 (GOWN DISPOSABLE) ×2
GOWN STRL REUS W/TWL XL LVL3 (GOWN DISPOSABLE) ×3 IMPLANT
HLDR LEG FOAM (MISCELLANEOUS) ×1 IMPLANT
IV NS IRRIG 3000ML ARTHROMATIC (IV SOLUTION) ×9 IMPLANT
KIT BLADEGUARD II DBL (SET/KITS/TRAYS/PACK) ×3 IMPLANT
KIT ROOM TURNOVER AP CYSTO (KITS) ×3 IMPLANT
LEG HOLDER FOAM (MISCELLANEOUS) ×2
MANIFOLD NEPTUNE II (INSTRUMENTS) ×3 IMPLANT
MARKER SKIN DUAL TIP RULER LAB (MISCELLANEOUS) ×3 IMPLANT
NEEDLE HYPO 18GX1.5 BLUNT FILL (NEEDLE) ×3 IMPLANT
NEEDLE HYPO 21X1.5 SAFETY (NEEDLE) ×3 IMPLANT
NEEDLE SPNL 18GX3.5 QUINCKE PK (NEEDLE) ×3 IMPLANT
NS IRRIG 1000ML POUR BTL (IV SOLUTION) ×3 IMPLANT
PACK ARTHRO LIMB DRAPE STRL (MISCELLANEOUS) ×3 IMPLANT
PAD ABD 5X9 TENDERSORB (GAUZE/BANDAGES/DRESSINGS) ×3 IMPLANT
PAD ARMBOARD 7.5X6 YLW CONV (MISCELLANEOUS) ×3 IMPLANT
PADDING CAST COTTON 6X4 STRL (CAST SUPPLIES) ×3 IMPLANT
PADDING WEBRIL 6 STERILE (GAUZE/BANDAGES/DRESSINGS) ×3 IMPLANT
PROBE BIPOLAR 50 DEGREE SUCT (MISCELLANEOUS) ×3 IMPLANT
SET ARTHROSCOPY INST (INSTRUMENTS) ×3 IMPLANT
SET ARTHROSCOPY PUMP TUBE (IRRIGATION / IRRIGATOR) ×3 IMPLANT
SET BASIN LINEN APH (SET/KITS/TRAYS/PACK) ×3 IMPLANT
SUT ETHILON 3 0 FSL (SUTURE) ×3 IMPLANT
SYR 30ML LL (SYRINGE) ×3 IMPLANT
SYRINGE 10CC LL (SYRINGE) ×3 IMPLANT
TUBE CONNECTING 12'X1/4 (SUCTIONS) ×3
TUBE CONNECTING 12X1/4 (SUCTIONS) ×6 IMPLANT

## 2016-12-07 NOTE — Anesthesia Postprocedure Evaluation (Signed)
Anesthesia Post Note  Patient: DEMARI GALES  Procedure(s) Performed: Procedure(s) (LRB): KNEE ARTHROSCOPY WITH MEDIAL MENISECTOMY (Left)  Patient location during evaluation: PACU Anesthesia Type: General Level of consciousness: awake and alert and oriented Pain management: pain level controlled Vital Signs Assessment: post-procedure vital signs reviewed and stable Respiratory status: spontaneous breathing Cardiovascular status: blood pressure returned to baseline Postop Assessment: no signs of nausea or vomiting Anesthetic complications: no     Last Vitals:  Vitals:   12/07/16 0830 12/07/16 0845  BP: 118/84 134/86  Pulse: (!) 56 (!) 59  Resp: 16 16  Temp: 36.6 C   SpO2: 100% 100%    Last Pain:  Vitals:   12/07/16 0830  TempSrc:   PainSc: Asleep                 Mataio Mele

## 2016-12-07 NOTE — Anesthesia Preprocedure Evaluation (Addendum)
Anesthesia Evaluation  Patient identified by MRN, date of birth, ID band Patient awake    Airway Mallampati: I  TM Distance: >3 FB Neck ROM: Full    Dental no notable dental hx. (+) Teeth Intact   Pulmonary former smoker (none for 2 years),    Pulmonary exam normal breath sounds clear to auscultation       Cardiovascular Exercise Tolerance: Good METS: 5 - 7 Mets hypertension, negative cardio ROS Normal cardiovascular exam Rhythm:Regular Rate:Normal  Sinus bradycardia Otherwise normal ECG  Patient had been on HTN meds, but was doing well after he stopped smoking, he has been taken off.  Today his blood pressure is nl.   Active, no CP/SOB   Neuro/Psych Anxiety    GI/Hepatic GERD  Medicated and Controlled,(+) Hepatitis -, CResults for DESIREE, FLEMING (MRN [id number]) as of 12/07/2016 06:13  06/12/2010 03:45 Hep A Ab, IgM: NEGATIVE Hepatitis B Surface Ag: NEGATIVE Hep B Core Ab, IgM: NEGATIVE... HCV Ab: NEGATIVE  06/15/2016 09:47 Hcv Genotype: 1a  06/15/2016 10:23 Hep B S Ab: NEG  08/15/2016 08:39 HCV Quantitative: <15 NOT DETECTED HCV Quantitative Log: <1.18 NOT DETECTED  Positive hepatitis C antibody and RNA confirmation. Negative hepatitis A and B.    Endo/Other  negative endocrine ROS  Renal/GU negative Renal ROS     Musculoskeletal  (+) Arthritis , Osteoarthritis,    Abdominal (+)  Abdomen: soft.    Peds  Hematology negative hematology ROS (+)   Anesthesia Other Findings   Reproductive/Obstetrics                           Anesthesia Physical Anesthesia Plan  ASA: II  Anesthesia Plan: General   Post-op Pain Management:    Induction: Intravenous  PONV Risk Score and Plan:   Airway Management Planned: LMA  Additional Equipment:   Intra-op Plan:   Post-operative Plan:   Informed Consent: I have reviewed the patients History and Physical, chart, labs and discussed the  procedure including the risks, benefits and alternatives for the proposed anesthesia with the patient or authorized representative who has indicated his/her understanding and acceptance.   Dental advisory given  Plan Discussed with: CRNA  Anesthesia Plan Comments:         Anesthesia Quick Evaluation

## 2016-12-07 NOTE — Anesthesia Procedure Notes (Signed)
Procedure Name: LMA Insertion Date/Time: 12/07/2016 7:36 AM Performed by: Tressie Stalker E Pre-anesthesia Checklist: Patient identified, Patient being monitored, Emergency Drugs available, Timeout performed and Suction available Patient Re-evaluated:Patient Re-evaluated prior to induction Oxygen Delivery Method: Circle System Utilized Preoxygenation: Pre-oxygenation with 100% oxygen Induction Type: IV induction Ventilation: Mask ventilation without difficulty LMA: LMA inserted LMA Size: 4.0 Number of attempts: 1 Placement Confirmation: positive ETCO2 and breath sounds checked- equal and bilateral

## 2016-12-07 NOTE — Discharge Instructions (Signed)

## 2016-12-07 NOTE — Interval H&P Note (Signed)
History and Physical Interval Note:  12/07/2016 7:11 AM  Jamie [name]  has presented today for surgery, with the diagnosis of MEDIAL MENISCUS TEAR  The various methods of treatment have been discussed with the patient and family. After consideration of risks, benefits and other options for treatment, the patient has consented to  Procedure(s): KNEE ARTHROSCOPY WITH MEDIAL MENISECTOMY (Left) as a surgical intervention .  The patient's history has been reviewed, patient examined, no change in status, stable for surgery.  I have reviewed the patient's chart and labs.  Questions were answered to the patient's satisfaction.     Arther Abbott

## 2016-12-07 NOTE — Brief Op Note (Addendum)
12/07/2016  8:15 AM  PATIENT:  Jamie [name]  46 y.o. male  PRE-OPERATIVE DIAGNOSIS:  MEDIAL MENISCUS TEAR  POST-OPERATIVE DIAGNOSIS:  left medial meniscal tear  PROCEDURE:  Procedure(s): KNEE ARTHROSCOPY WITH MEDIAL MENISECTOMY (Left) 29881  SURGEON:  Surgeon(s) and Role:    Carole Civil, MD - Primary  PHYSICIAN ASSISTANT:   ASSISTANTS: none   ANESTHESIA:   none  EBL:  Total I/O In: 700 [I.V.:700] Out: 0   BLOOD ADMINISTERED:none  DRAINS: none   LOCAL MEDICATIONS USED:  Marcaine with epi 60 cc  SPECIMEN:  No Specimen  DISPOSITION OF SPECIMEN:  N/A  COUNTS:  YES  TOURNIQUET:    DICTATION: .Dragon Dictation  PLAN OF CARE: Discharge to home after PACU  PATIENT DISPOSITION:  PACU - hemodynamically stable.   Delay start of Pharmacological VTE agent (>24hrs) due to surgical blood loss or risk of bleeding: not applicable

## 2016-12-07 NOTE — Transfer of Care (Signed)
Immediate Anesthesia Transfer of Care Note  Patient: Jamie [name]  Procedure(s) Performed: Procedure(s): KNEE ARTHROSCOPY WITH MEDIAL MENISECTOMY (Left)  Patient Location: PACU  Anesthesia Type:MAC  Level of Consciousness: awake and alert   Airway & Oxygen Therapy: Patient Spontanous Breathing  Post-op Assessment: Report given to RN  Post vital signs: Reviewed and stable  Last Vitals:  Vitals:   12/07/16 0720 12/07/16 0725  BP: 121/83   Pulse:    Resp: 19 14  Temp:    SpO2: 97% 96%    Last Pain:  Vitals:   12/07/16 0636  TempSrc: Oral  PainSc: 4       Patients Stated Pain Goal: 7 (88/67/73 7366)  Complications: No apparent anesthesia complications

## 2016-12-07 NOTE — Op Note (Signed)
12/07/2016  8:15 AM  PATIENT:  Jamie [name]  46 y.o. male  PRE-OPERATIVE DIAGNOSIS:  MEDIAL MENISCUS TEAR  POST-OPERATIVE DIAGNOSIS:  left medial meniscal tear  PROCEDURE:  Procedure(s): KNEE ARTHROSCOPY WITH MEDIAL MENISECTOMY (Left) 29881  SURGEON:  Surgeon(s) and Role:    Carole Civil, MD - Primary  PHYSICIAN ASSISTANT:   ASSISTANTS: none   ANESTHESIA:   none  EBL:  Total I/O In: 700 [I.V.:700] Out: 0   BLOOD ADMINISTERED:none  DRAINS: none   LOCAL MEDICATIONS USED:  Marcaine with epi 60 cc  SPECIMEN:  No Specimen  DISPOSITION OF SPECIMEN:  N/A  COUNTS:  YES  TOURNIQUET:    DICTATION: .Dragon Dictation  Knee arthroscopy dictation  The patient was identified in the preoperative holding area using 2 approved identification mechanisms. The chart was reviewed and updated. The surgical site was confirmed as left knee and marked with an indelible marker.  The patient was taken to the operating room for anesthesia. After successful  general anesthesia, 2gm ancef was used as IV antibiotics.  The patient was placed in the supine position with the (left) the operative extremity in an arthroscopic leg holder and the opposite extremity in a padded leg holder.  The timeout was executed.  A lateral portal was established with an 11 blade and the scope was introduced into the joint. A diagnostic arthroscopy was performed in circumferential manner examining the entire knee joint. A medial portal was established and the diagnostic arthroscopy was repeated using a probe to palpate intra-articular structures as they were encountered.   The operative findings are   Medial osteoarthritis diffuse grade 1-2 changes medial femoral condyle, fishmouth tear posterior horn medial meniscus. 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 The meniscal fragments were removed with a motorized shaver. The meniscus was balanced with a combination of a motorized shaver and a 50 ArthroCare wand until a stable rim was obtained.  Chondroplasties were done on the medial femoral condyle and patella  The arthroscopic pump was placed on the wash mode and any excess debris was removed from the joint using suction.  60 cc of Marcaine with epinephrine was injected through the arthroscope.  The portals were closed with 3-0 nylon suture.  A sterile bandage, Ace wrap and Cryo/Cuff was placed and the Cryo/Cuff was activated. The patient was taken to the recovery room in stable condition.

## 2016-12-08 ENCOUNTER — Encounter (HOSPITAL_COMMUNITY): Payer: Self-pay | Admitting: Orthopedic Surgery

## 2016-12-08 ENCOUNTER — Telehealth: Payer: Self-pay | Admitting: Orthopedic Surgery

## 2016-12-08 NOTE — Telephone Encounter (Signed)
Patient informed of DR, Harrison's reply.

## 2016-12-08 NOTE — Telephone Encounter (Signed)
Not needed, had before surgery during operation

## 2016-12-08 NOTE — Telephone Encounter (Signed)
Patient asking about prescription for anti-biotic, following his discharge from out-patient surgery yesterday, 12/07/16? [facility], [employer].  Please advise.

## 2016-12-08 NOTE — Telephone Encounter (Signed)
Routing to Dr Harrison 

## 2016-12-12 ENCOUNTER — Encounter: Payer: Self-pay | Admitting: Orthopedic Surgery

## 2016-12-12 ENCOUNTER — Encounter: Payer: Medicaid Other | Admitting: Orthopedic Surgery

## 2016-12-12 DIAGNOSIS — Z9889 Other specified postprocedural states: Secondary | ICD-10-CM

## 2016-12-12 NOTE — Progress Notes (Signed)
Patient here for suture removal follow ing left knee arthroscopy 12/07/16. Sutures removed.  Incisions were clean dry, intact, no bleeding, drainage or redness noted. Band aids applied. Informed to keep follow up appointment with Dr. Aline Brochure 12/18/16.

## 2016-12-18 ENCOUNTER — Ambulatory Visit (INDEPENDENT_AMBULATORY_CARE_PROVIDER_SITE_OTHER): Payer: Medicaid Other | Admitting: Orthopedic Surgery

## 2016-12-18 DIAGNOSIS — Z9889 Other specified postprocedural states: Secondary | ICD-10-CM

## 2016-12-18 DIAGNOSIS — Z4889 Encounter for other specified surgical aftercare: Secondary | ICD-10-CM

## 2016-12-18 MED ORDER — HYDROCODONE-ACETAMINOPHEN 5-325 MG PO TABS: 1.0000 | ORAL_TABLET | ORAL | 0 refills | Status: DC | PRN

## 2016-12-18 NOTE — Patient Instructions (Signed)
ICE   ACE WRAP  EXERCISES   INCREASE WEIGHT BEARING

## 2016-12-18 NOTE — Progress Notes (Signed)
Postoperative visit  Status post arthroscopy left knee  Postop day #11  date of surgery was August 30    The operative findings are    Medial osteoarthritis diffuse grade 1-2 changes medial femoral condyle, fishmouth tear posterior horn medial meniscus. 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 He would like his pain medication refilled which is fine  Meds ordered this encounter  Medications  . HYDROcodone-acetaminophen (NORCO/VICODIN) 5-325 MG tablet    Sig: Take 1 tablet by mouth every 4 (four) hours as needed for moderate pain.    Dispense:  42 tablet    Refill:  0    Continue ice with increased exercise weightbearing and follow-up in 2 weeks

## 2016-12-24 IMAGING — MR MR LUMBAR SPINE W/O CM
4 of 5 series · 13 of 48 positions shown · non-contrast
Comparison: 09/28/2014

CLINICAL DATA: Low back pain. Lower extremity weakness. Previous
surgery last year.

EXAM:
MRI LUMBAR SPINE WITHOUT CONTRAST
TECHNIQUE: Multiplanar, multisequence MR imaging of the lumbar spine was
performed. No intravenous contrast was administered.

[Series 4: T2 · sagittal · 4.0mm · 0.47mm/px · 4 of 15 slices shown (1 of 3)]
[im 1/15]
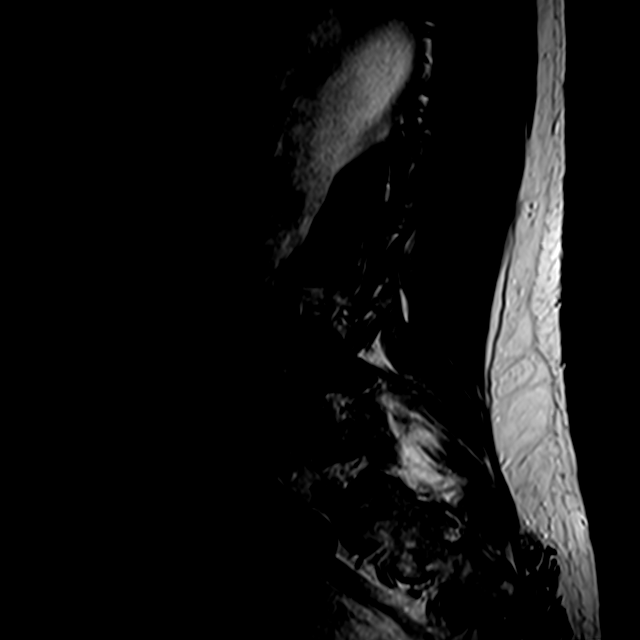
[im 4/15]
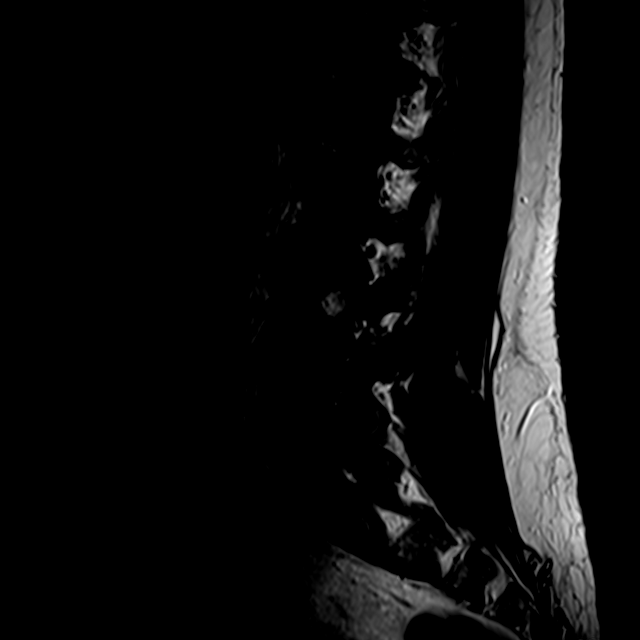
[im 8/15]
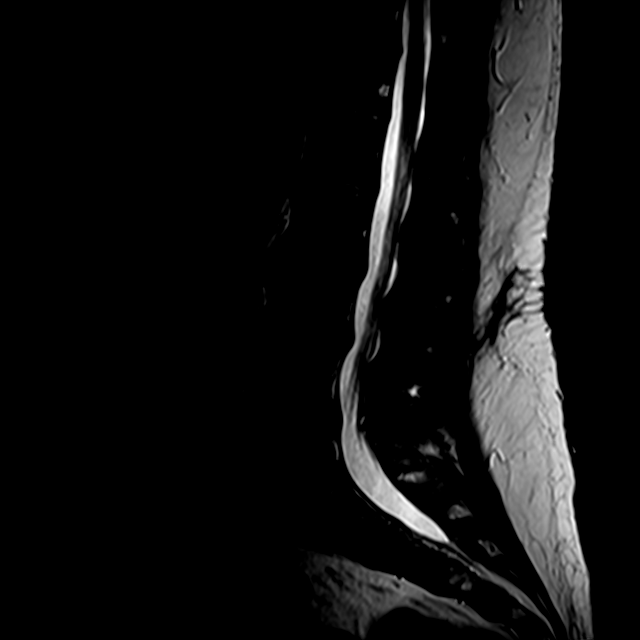
[im 15/15]
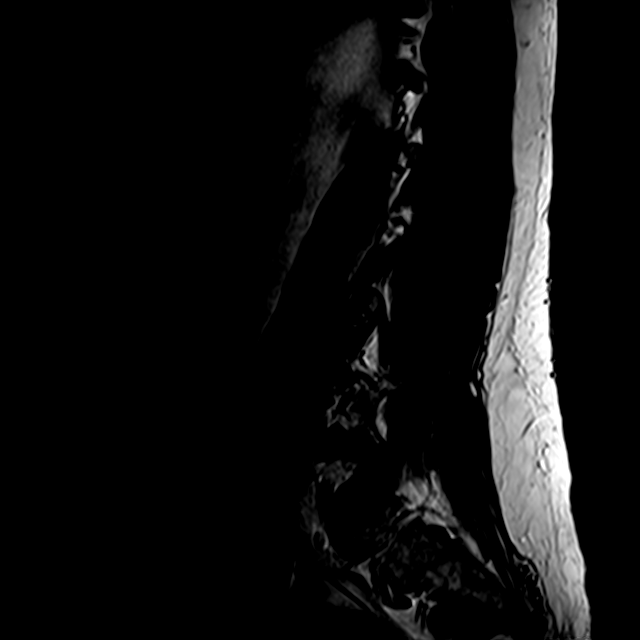

[Series 5: T1 · sagittal · 4.0mm · 0.47mm/px · 3 of 15 slices shown]
[im 1/15]
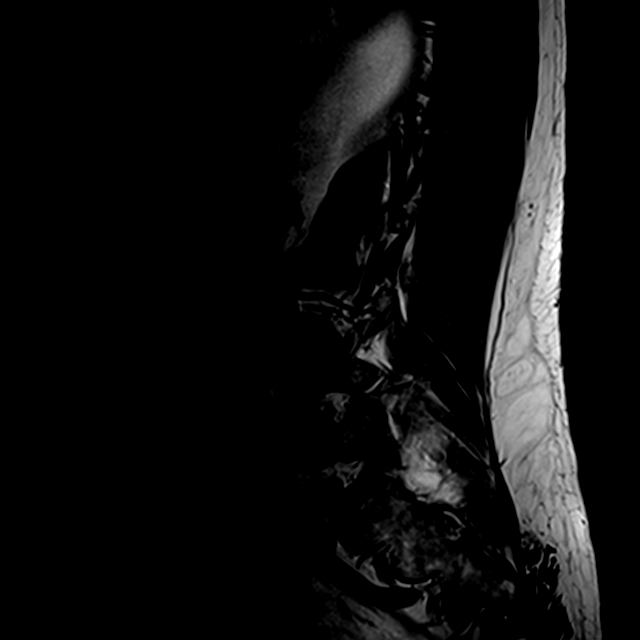
[im 8/15]
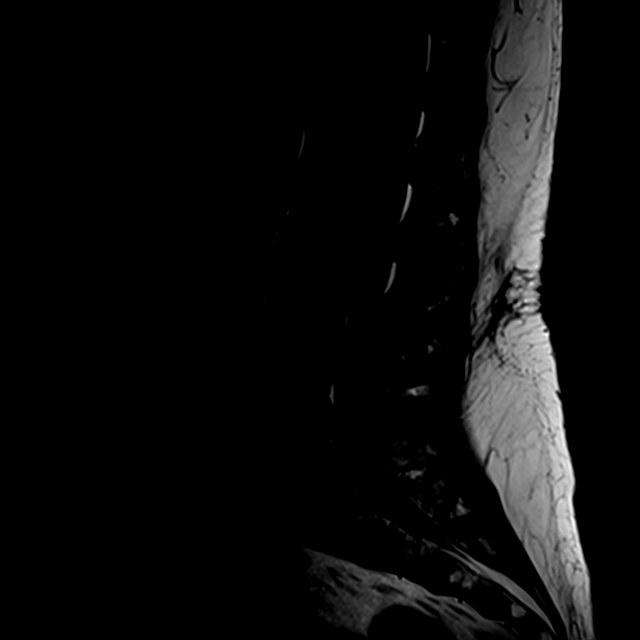
[im 15/15]
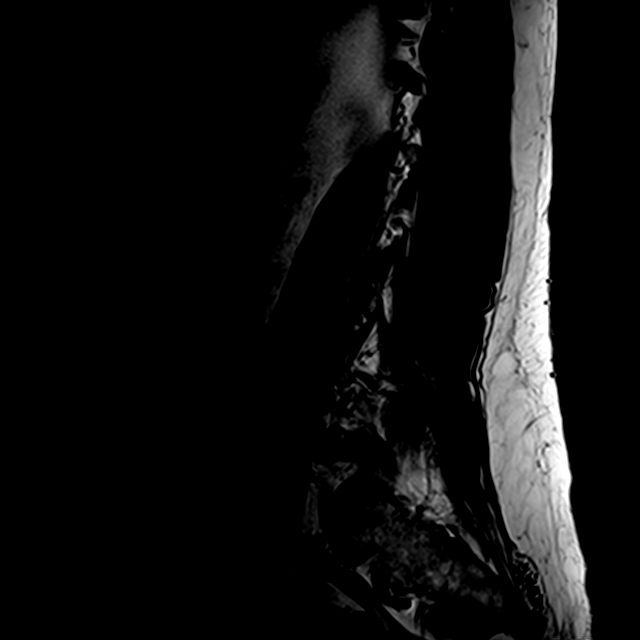

[Series 6: T2 · sagittal · 4.0mm · 0.47mm/px · 3 of 20 slices shown (2 of 3)]
[im 4/20]
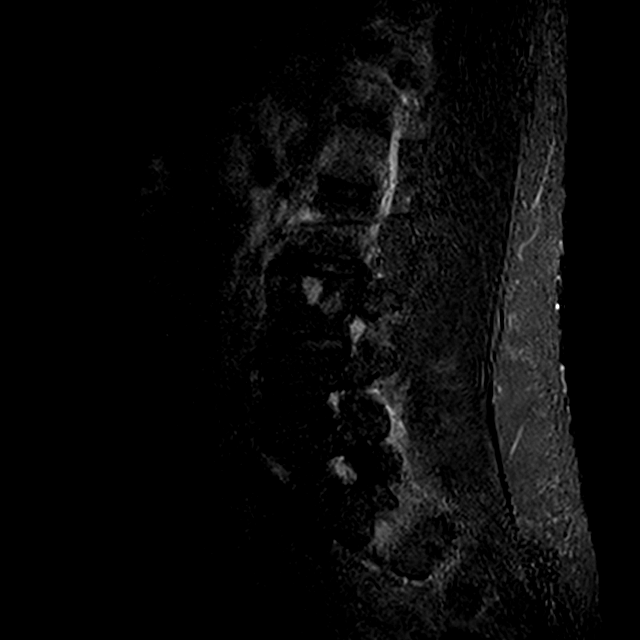
[im 12/20]
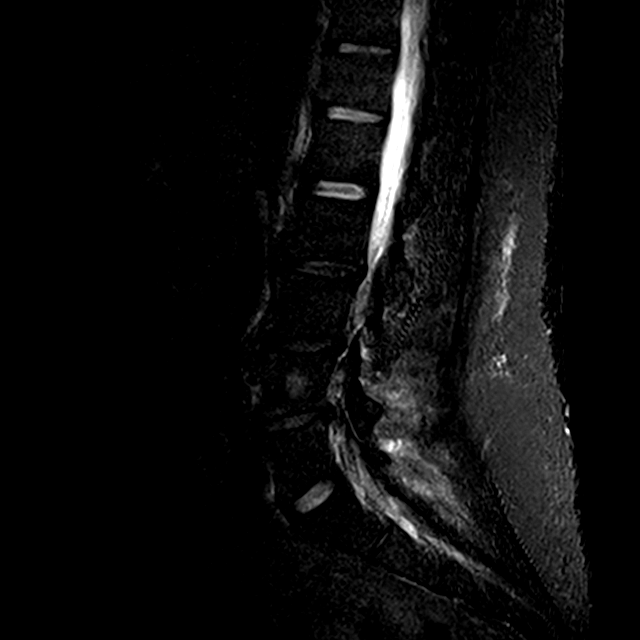
[im 20/20]
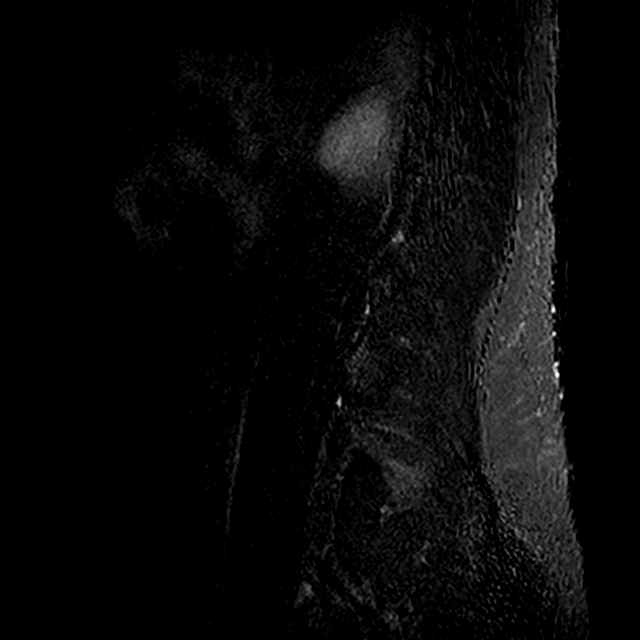

[Series 7: T2 · axial · 4.0mm · 0.27mm/px · z∈[-143,+34]mm · 3 of 49 slices shown (3 of 3)]
[im 7/49]
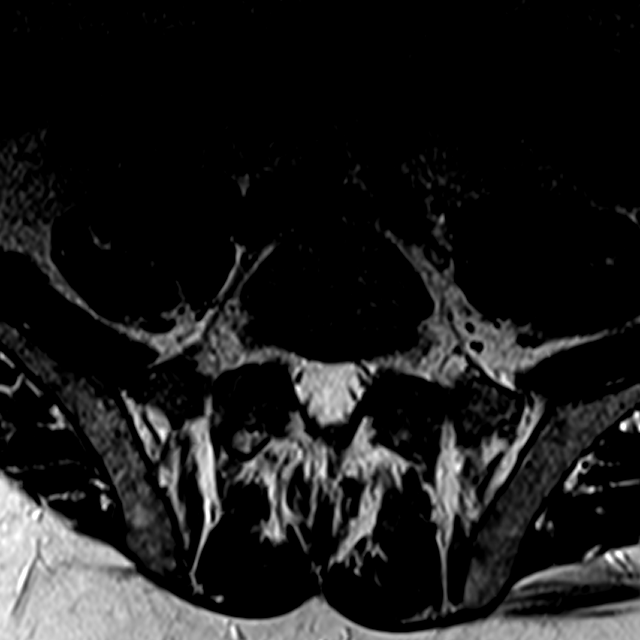
[im 26/49]
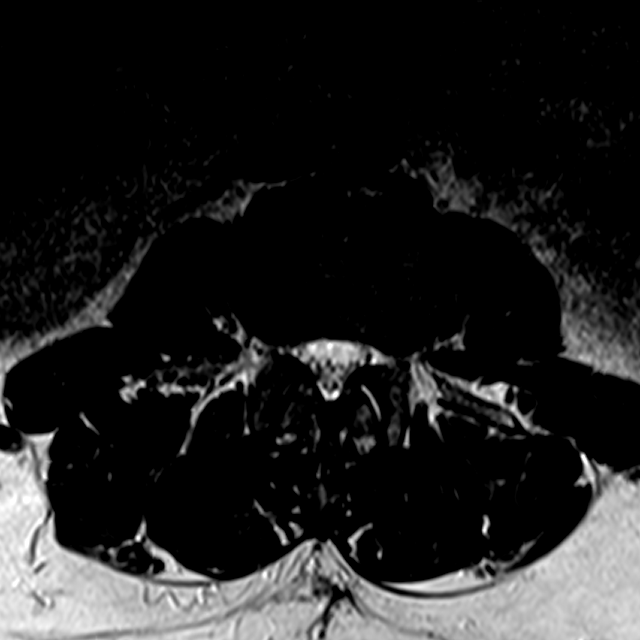
[im 42/49]
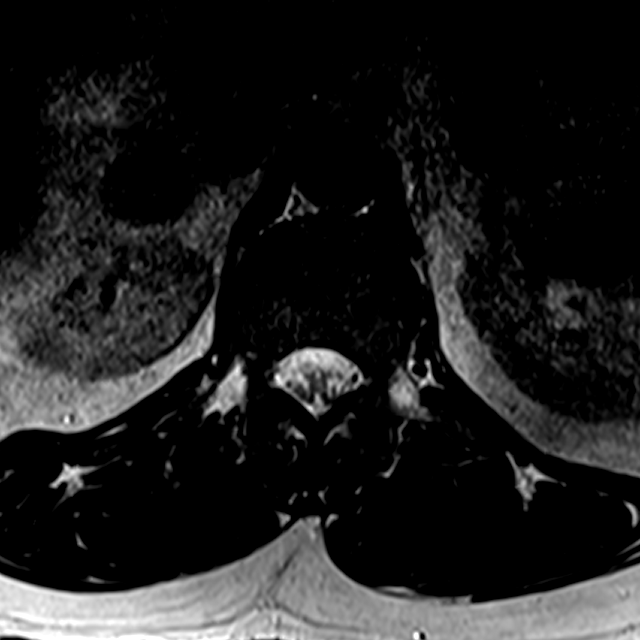

[13 of 48 positions shown; findings below may reference images not displayed]

FINDINGS: There is no significant finding at T11-12, T12-L1 or L1-2. No
stenosis. The distal cord and conus are normal.

L2-3:  Minimal desiccation and bulging of the disc.  No stenosis.

L3-4: Previous partial left hemilaminectomy at this level.
Retrolisthesis of 2 mm. Desiccation and mild bulging of the disc.
Mild narrowing of both lateral recesses without visible neural
compression.

L4-5: Disc degeneration with shallow broad-based herniation of disc
material more towards the right. This indents the thecal sac. There
is mild stenosis of the lateral recesses and foramina, right more
than left. Neural compression could possibly occur on the right.
There is facet edema at this level, right more than left, that could
contribute to back pain. This has worsened since the previous study.

L5-S1: Normal appearance of the disc. Mild facet degeneration. No
canal or foraminal stenosis.

Compared to the previous study, no change is appreciated.
IMPRESSION: Previous partial left hemilaminectomy at L3-4. No residual or
recurrent disc herniation.

Broad-based disc herniation at L4-5 more prominent towards the right
with narrowing of the lateral recesses and foramina right more than
left. No significant change since the last study. Facet arthropathy
is slightly worsened and now shows edema and could be associated
with back pain.

## 2017-01-01 ENCOUNTER — Encounter: Payer: Self-pay | Admitting: Orthopedic Surgery

## 2017-01-01 ENCOUNTER — Ambulatory Visit (INDEPENDENT_AMBULATORY_CARE_PROVIDER_SITE_OTHER): Payer: Medicaid Other | Admitting: Orthopedic Surgery

## 2017-01-01 VITALS — BP 124/90 | HR 66 | Ht 70.0 in | Wt 251.0 lb

## 2017-01-01 DIAGNOSIS — Z4889 Encounter for other specified surgical aftercare: Secondary | ICD-10-CM

## 2017-01-01 DIAGNOSIS — Z9889 Other specified postprocedural states: Secondary | ICD-10-CM

## 2017-01-01 DIAGNOSIS — M25462 Effusion, left knee: Secondary | ICD-10-CM

## 2017-01-01 MED ORDER — HYDROCODONE-ACETAMINOPHEN 5-325 MG PO TABS: 1.0000 | ORAL_TABLET | Freq: Three times a day (TID) | ORAL | 0 refills | Status: DC | PRN

## 2017-01-01 NOTE — Progress Notes (Signed)
Postoperative  No chief complaint on file.  DOS: 12/07/16  POD # 64  The operative findings are    Medial osteoarthritis diffuse grade 1-2 changes medial femoral condyle, fishmouth tear posterior horn medial meniscus. Mild chondral malacia of the tibial plateau diffuse grade 1 Lateral normal lateral meniscus and articular cartilage Patellofemoral grade 2 median ridge of patella and grade 3 diffuse trochlear cartilage chondromalacia Notch normal anterior cruciate ligament PCL 12/07/2016  8:18 AM  PATIENT:  Jamie [name]  46 y.o. male  PRE-OPERATIVE DIAGNOSIS:  MEDIAL MENISCUS TEAR  POST-OPERATIVE DIAGNOSIS:  left medial meniscal tear  PROCEDURE:  Procedure(s): KNEE ARTHROSCOPY WITH MEDIAL MENISECTOMY (Left)-29881  He reports he is improved since his last visit. He does complain of swelling of the left knee. He still using his cane.  Current medications are  Current Outpatient Prescriptions:  .  ALPRAZolam (XANAX) 1 MG tablet, Take 1mg  at bedtime as needed for sleep., Disp: , Rfl: 5 .  clonazePAM (KLONOPIN) 1 MG tablet, Take 1 mg by mouth 3 (three) times daily as needed for anxiety. , Disp: , Rfl:  .  diclofenac (CATAFLAM) 50 MG tablet, Take 1 tablet (50 mg total) by mouth 2 (two) times daily., Disp: 60 tablet, Rfl: 5 .  HYDROcodone-acetaminophen (NORCO/VICODIN) 5-325 MG tablet, Take 1 tablet by mouth every 8 (eight) hours as needed for moderate pain., Disp: 21 tablet, Rfl: 0 .  traMADol (ULTRAM) 50 MG tablet, TK 1 T PO UP TO QID FOR SEVERE PAIN, Disp: , Rfl: 0  Left knee effusion large, tender diffusely. Flexion limited to 90.  Procedure note injection and aspiration left knee joint  Verbal consent was obtained to aspirate and inject the left knee joint   Timeout was completed to confirm the site of aspiration and injection  An 18-gauge needle was used to aspirate the left knee joint from a suprapatellar lateral approach.  The medications used were 40 mg of Depo-Medrol  and 1% lidocaine 3 cc  Anesthesia was provided by ethyl chloride and the skin was prepped with alcohol.  After cleaning the skin with alcohol an 18-gauge needle was used to aspirate the right knee joint.  We obtained 20 cc of fluid, the fluid was clear synovial fluid it did not look infected  We followed this by injection of 40 mg of Depo-Medrol and 3 cc 1% lidocaine.  There were no complications. A sterile bandage was applied.   Encounter Diagnoses  Name Primary?  Marland Kitchen Aftercare following surgery Yes  . S/P arthroscopy of left knee   . Effusion of knee joint, left    Plan is to continue his anti-inflammatories use ice as needed. Use over-the-counter arthritis medications to assist with pain relief and taper his opioid medication.  FOLLOW UP AS NEEDED   The [facility] website has been checked and there are no other opioid prescriptions on record

## 2017-01-01 NOTE — Patient Instructions (Signed)
Continue ice and anti-inflammatory medication  Try to avoid using opioids to control your pain. There are alternatives over the counter. You can try  CAPZACIN  BLU EMU BEN-GAY  SALON PAS PATCHES   What You Need to Know About Prescription Opioid Pain Medicine        Please be advised. You are on a medication which is classified as an "opiod". The CDC the [facility]  has recently advised all providers to advise patient's that these medications have certain risks which include but are not limited to:    drug intolerance  drug addiction  respiratory depression   respiratory failure  Death  Please keep these medications locked away. If you feel that you are becoming addicted to these medicines or you are having difficulties with these medications please alert your provider.   As your provider I will attempt to wean you off of these medications when you're severe acute pain has been taking care of. However, if we cannot wean you off of this medication you will be sent to a pain management center where they can better manage chronic pain   Opioids are powerful medicines that are used to treat moderate to severe pain. Opioids should be taken with the supervision of a trained health care provider. They should be taken for the shortest period of time as possible. This is because opioids can be addictive and the longer you take opioids, the greater your risk of addiction (opioid use disorder). What do opioids do? Opioids help reduce or eliminate pain. When used for short periods of time, they can help you:  Sleep better.  Do better in physical or occupational therapy.  Feel better in the first few days after an injury.  Recover from surgery. What kind of problems can opioids cause? Opioids can cause side effects, such as:  Constipation.  Nausea.  Vomiting.  Drowsiness.  Confusion.  Opioid use disorder.  Breathing difficulties (respiratory  depression). Using opioid pain medicines for longer than 3 days increases your risk of these side effects. Taking opioid pain medicine for a long period of time can affect your ability to do daily tasks. It also puts you at risk for:  Car accidents.  Heart attack.  Overdose, which can sometimes lead to death. What can increase my risk for developing problems while taking opioids? You may be at an especially high risk for problems while taking opioids if you:  Are over the age of 68.  Are pregnant.  Have kidney or liver disease.  Have certain mental health conditions, such as depression or anxiety.  Have a history of substance use disorder.  Have had an opioid overdose in the past. How do I stop taking opioids if I have been taking them for a long time? If you have been taking opioid medicine for more than a few weeks, you may need to slowly stop taking them (taper). Tapering your use of opioids can decrease your chances of experiencing withdrawal symptoms, such as:  Abdominal pain and cramping.  Nausea.  Sweating.  Sleepiness.  Restlessness.  Uncontrollable shaking (tremors).  Cravings for the medicine. Do not attempt to taper your use of opioids on your own. Talk with your health care provider about how to do this. Your health care provider may prescribe a step-down schedule based on how much medicine you are taking and how long you have been taking it. What are the benefits of stopping the use of opioids? By switching from opioid pain  medicine to non-opioid pain management options, you will decrease your risk of accidents and injuries associated with long-term opioid use. You will also be able to:  Monitor your pain more accurately and know when to seek medical care if it is not improving.  Decrease risk to others around you. Having opioids in the home increases the risk for accidental or intentional use or overdose by others. How can I treat pain without  opioids? Pain can be managed with many types of alternative treatments. Ask your health care provider to refer you to one or more specialists who can help you manage pain through:  Physical or occupational therapy.  Counseling (cognitive-behavioral therapy).  Good nutrition.  Biofeedback.  Massage.  Meditation.  Non-opioid medicine.  Following a gentle exercise program. Where can I get support? If you have been taking opioids for a long time, you may benefit from receiving support for quitting from a local support group or counselor. Ask your health care provider for a referral to these resources in your area. When should I seek medical care? Seek medical care right away if you are taking opioids and you experience any of the following:  Difficulty breathing.  Breathing that is more shallow or slower than normal.  A very slow heartbeat (pulse).  Severe confusion.  Unconsciousness.  Sleepiness.  Difficulty waking from sleep.  Slurred speech.  Nausea and vomiting.  Cold, clammy skin.  Blue lips or fingernails.  Limpness.  Abnormally small pupils. If you think that you or someone else may have taken too much of an opioid medicine, get medical help right away. Do not wait to see if the symptoms go away on their own. Call your local emergency services (911 in the [place] or call the hotline of the [facility] 947-544-8053 in the [place].).  Where can I get more information? To learn more about opioid medicines, visit the Centers for Disease Control and Prevention web site Opioid Basics at https://keller-santana.com/. Summary  Opioid medicines can help you manage moderate-to-severe pain for a short period of time.  Taking opioid pain medicine for a long period of time puts you at risk for unintentional accidents, injury, and even death.  If you think that you or someone else may have taken too much of an opioid, get medical help  right away. This information is not intended to replace advice given to you by your health care provider. Make sure you discuss any questions you have with your health care provider. Document Released: 04/23/2015 Document Revised: 11/19/2015 Document Reviewed: 11/06/2014 Elsevier Interactive Patient Education  2017 [employer].

## 2017-01-15 ENCOUNTER — Ambulatory Visit: Payer: Medicaid Other | Admitting: Nurse Practitioner

## 2017-02-09 ENCOUNTER — Ambulatory Visit (INDEPENDENT_AMBULATORY_CARE_PROVIDER_SITE_OTHER): Payer: Medicaid Other | Admitting: Orthopedic Surgery

## 2017-02-09 VITALS — BP 156/104 | HR 68 | Ht 70.0 in | Wt 250.0 lb

## 2017-02-09 DIAGNOSIS — M25562 Pain in left knee: Secondary | ICD-10-CM | POA: Diagnosis not present

## 2017-02-09 DIAGNOSIS — M25462 Effusion, left knee: Secondary | ICD-10-CM

## 2017-02-09 MED ORDER — ACETAMINOPHEN-CODEINE #3 300-30 MG PO TABS: 1.0000 | ORAL_TABLET | ORAL | 0 refills | Status: DC | PRN

## 2017-02-09 NOTE — Progress Notes (Signed)
Chief Complaint  Patient presents with  . Follow-up    Recheck on left knee, DOS 12-07-16.    46 year old male had knee arthroscopy had arthritis in his knee presents back with pain and swelling After losing his balance 5 days ago landing on his left knee. He felt acute pain and since that time dull aching pain is become constant it feels like his knees rubbing together and his knee has become swollen. He took some ibuprofen last night a help with pain he was already on Cataflam  Review of Systems  Constitutional: Negative.   Musculoskeletal: Positive for joint pain.  Skin: Negative.   Neurological: Negative.  Negative for tingling. Sensory change: Mood and affect are normal he is a little anxious he is oriented 3 his appearance is normal.    Past Medical History:  Diagnosis Date  . Anxiety   . Arthritis   . GERD (gastroesophageal reflux disease)    occ  . HCV antibody positive   . Hypertension    "Dr Karie Kirks took me off meds"  diet control  . Pre-diabetes    BP (!) 156/104   Pulse 68   Ht 5\' 10"  (1.778 m)   Wt 250 lb (113.4 kg)   BMI 35.87 kg/m   Well-developed well-nourished grooming hygiene normal He is oriented 3 His mood and affect are normal with some anxiety  He has a noticeable limp in his left leg  He has a small effusion and tenderness in the left knee Flexion is 120 full extension Ligaments are stable Quadricep strength is normal I don't see any rash or redness around the skin  Sensation is normal Pulses are normal  Procedure note injection and aspiration left knee joint  Verbal consent was obtained to aspirate and inject the left knee joint   Timeout was completed to confirm the site of aspiration and injection  An 18-gauge needle was used to aspirate the left knee joint from a suprapatellar lateral approach.  The medications used were 40 mg of Depo-Medrol and 1% lidocaine 3 cc  Anesthesia was provided by ethyl chloride and the skin was prepped  with alcohol.  After cleaning the skin with alcohol an 18-gauge needle was used to aspirate the right knee joint.  We obtained 5 cc of fluid  We followed this by injection of 40 mg of Depo-Medrol and 3 cc 1% lidocaine.  There were no complications. A sterile bandage was applied.   Current Outpatient Prescriptions:  .  ALPRAZolam (XANAX) 1 MG tablet, Take 1mg  at bedtime as needed for sleep., Disp: , Rfl: 5 .  clonazePAM (KLONOPIN) 1 MG tablet, Take 1 mg by mouth 3 (three) times daily as needed for anxiety. , Disp: , Rfl:  .  diclofenac (CATAFLAM) 50 MG tablet, Take 1 tablet (50 mg total) by mouth 2 (two) times daily., Disp: 60 tablet, Rfl: 5 .  HYDROcodone-acetaminophen (NORCO/VICODIN) 5-325 MG tablet, Take 1 tablet by mouth every 8 (eight) hours as needed for moderate pain., Disp: 21 tablet, Rfl: 0 .  traMADol (ULTRAM) 50 MG tablet, TK 1 T PO UP TO QID FOR SEVERE PAIN, Disp: , Rfl: 0  Meds ordered this encounter  Medications  . acetaminophen-codeine (TYLENOL #3) 300-30 MG tablet    Sig: Take 1 tablet by mouth every 4 (four) hours as needed for moderate pain.    Dispense:  30 tablet    Refill:  0    Recommend ice daily, wear his brace take his Cataflam Tylenol 3  follow-up if no improvement

## 2017-02-09 NOTE — Patient Instructions (Signed)
Ice daily Where your braces Take the Cataflam Take Tylenol with codeine No. 3 one every 4 hours as needed for the next 7 days

## 2017-02-26 ENCOUNTER — Telehealth: Payer: Self-pay | Admitting: Orthopedic Surgery

## 2017-02-26 NOTE — Telephone Encounter (Signed)
Patient called late today stating that his knee was swollen.  He wanted an appointment for tomorrow.  I told him that Dr. [name] was not seeing patients tomorrow and did not have any openings on Wednesday, the 21st.  I told Jamie [name] that the first opening wouldn't be until the 28th.  I offered an appointment on the 28th and he then stated he was going to be out of town until at least the 30th.  Appointment was given for Monday, March 12, 2017 at 9:50.  I told him that if the knee pain got so bad that he couldn't stand it, he should go to the ER.  He said he would.

## 2017-02-28 ENCOUNTER — Encounter: Payer: Self-pay | Admitting: Nurse Practitioner

## 2017-02-28 ENCOUNTER — Encounter: Payer: Self-pay | Admitting: *Deleted

## 2017-02-28 ENCOUNTER — Ambulatory Visit: Payer: Medicaid Other | Admitting: Nurse Practitioner

## 2017-02-28 ENCOUNTER — Other Ambulatory Visit: Payer: Self-pay | Admitting: *Deleted

## 2017-02-28 ENCOUNTER — Encounter: Payer: Self-pay | Admitting: Internal Medicine

## 2017-02-28 VITALS — BP 132/84 | HR 64 | Temp 97.7°F | Ht 70.0 in | Wt 257.4 lb

## 2017-02-28 DIAGNOSIS — K7460 Unspecified cirrhosis of liver: Secondary | ICD-10-CM

## 2017-02-28 DIAGNOSIS — B182 Chronic viral hepatitis C: Secondary | ICD-10-CM | POA: Diagnosis not present

## 2017-02-28 DIAGNOSIS — Z800 Family history of malignant neoplasm of digestive organs: Secondary | ICD-10-CM | POA: Diagnosis not present

## 2017-02-28 DIAGNOSIS — K7400 Hepatic fibrosis, unspecified: Secondary | ICD-10-CM | POA: Insufficient documentation

## 2017-02-28 MED ORDER — CLENPIQ 10-3.5-12 MG-GM -GM/160ML PO SOLN: 1.0000 | Freq: Once | ORAL | 0 refills | Status: AC

## 2017-02-28 NOTE — Addendum Note (Signed)
Addended by: Gordy Levan, ERIC A on: 02/28/2017 02:20 PM   Modules accepted: Orders

## 2017-02-28 NOTE — Assessment & Plan Note (Signed)
The patient has completed treatment for hepatitis C.  His treatment finished about 3 months ago and we will check labs including CBC, CMP, hepatitis C RNA to document sustained virologic response.  Given his cirrhosis we will also check an INR and AFP.  I have discussed ongoing cirrhosis care with him with office visits, labs, imaging every 6 months.  He is agreeable.  Return for follow-up in 6 months to begin routine cirrhosis care.

## 2017-02-28 NOTE — Progress Notes (Signed)
Referring Provider: Lemmie Evens, MD Primary Care Physician:  Lemmie Evens, MD Primary GI:  Dr. Gala Romney  Chief Complaint  Patient presents with  . Hepatitis C    f/u, doing ok    HPI:   Jamie [name] is a 46 y.o. male who presents for follow-up on hepatitis C.  Patient was last seen in our office 08/15/2016.  Noted hepatitis C positive genotype 1 a, based on serology he needs hepatitis a and B vaccination.  Mild transaminitis.  Ultrasound elastography found some F3 and F4.  Insurance covered Emergency planning/management officer for treatment.  He began treatment and noted some increased fatigue which is not a known adverse effect from his medication.  He would notify us for any worsening symptoms.  His last office visit he was doing okay.  Fatigue improved.  Occasional left-sided abdominal pain/bloating but overall "doing pretty good."  No other worsening GI symptoms, no hepatic symptoms noted.  Recommended labs after 4 weeks of treatment with an expected duration of a total weeks of treatment.  Ordered CBC, CMP, PT/INR, hepatitis C RNA.  Return for follow-up in 5 months for 3 months posttreatment labs and follow-up.  Call with any questions or concerns.  Labs found normal CBC, normalization of transaminases and essentially normal liver function.  INR normal at 1.0.  Hepatitis C RNA quantitative found to be less than 15/not detected.  Lab results were communicated to the patient and recommended complete duration of treatment.  Today he states ok overall. Had recent knee surgery. He completed his HCV medication, no ADEs during treatment. Has some lower abdominal discomfort which he attributes to some weight gain. Denies N/V, hematochezia, melena, fever, chills, unintentional weight loss. Denies yellowing of skin/eyes, darkened urine, tremors, acute episodic confusion. Denies chest pain, dyspnea, dizziness, lightheadedness, syncope, near syncope. Denies any other upper or lower GI symptoms.  His last TCS was 2009 and  found no polyps. No definitive recommendations for repeat exam. He now has a primary family history of CRC.    Past Medical History:  Diagnosis Date  . Anxiety   . Arthritis   . GERD (gastroesophageal reflux disease)    occ  . HCV antibody positive   . Hypertension    "Dr Karie Kirks took me off meds"  diet control  . Pre-diabetes     Past Surgical History:  Procedure Laterality Date  . BACK SURGERY    . CARPAL TUNNEL RELEASE Right 06/25/2013   Procedure: CARPAL TUNNEL RELEASE;  Surgeon: Carole Civil, MD;  Location: [facility];  Service: Orthopedics;  Laterality: Right;  . CARPAL TUNNEL RELEASE Left 07/25/2013   Procedure: LEFT CARPAL TUNNEL RELEASE;  Surgeon: Carole Civil, MD;  Location: [facility];  Service: Orthopedics;  Laterality: Left;  . ELBOW SURGERY Left   . FOOT SURGERY    . HERNIA REPAIR Right    inguinal- age 59  . KNEE ARTHROSCOPY WITH MEDIAL MENISECTOMY Left 12/07/2016   Procedure: KNEE ARTHROSCOPY WITH MEDIAL MENISECTOMY;  Surgeon: Carole Civil, MD;  Location: [facility];  Service: Orthopedics;  Laterality: Left;  . KNEE SURGERY    . left elbow    . LUMBAR LAMINECTOMY/DECOMPRESSION MICRODISCECTOMY Left 10/06/2013   Procedure: Left Lumbar Three-four microdiskectomy;  Surgeon: Ophelia Charter, MD;  Location: [place] [facility];  Service: Neurosurgery;  Laterality: Left;  Left Lumbar Three-four microdiskectomy  . right foot     forgein body removal  . right knee  orif right patella [place] 1993  . SEPTOPLASTY  Current Outpatient Medications  Medication Sig Dispense Refill  . ALPRAZolam (XANAX) 1 MG tablet Take 1mg  at bedtime as needed for sleep.  5  . clonazePAM (KLONOPIN) 1 MG tablet Take 1 mg by mouth 3 (three) times daily as needed for anxiety.     . diclofenac (CATAFLAM) 50 MG tablet Take 1 tablet (50 mg total) by mouth 2 (two) times daily. 60 tablet 5   No current facility-administered medications for this visit.     Allergies as of 02/28/2017  . (No  Known Allergies)    Family History  Problem Relation Age of Onset  . Heart disease Unknown   . Arthritis Unknown   . Cancer Unknown   . Asthma Unknown   . Diabetes Unknown   . Kidney disease Unknown   . Colon cancer Father 59    Social History   Socioeconomic History  . Marital status: Legally Separated    Spouse name: is seperated  . Number of children: None  . Years of education: 8th grade   . Highest education level: None  Social Needs  . Financial resource strain: None  . Food insecurity - worry: None  . Food insecurity - inability: None  . Transportation needs - medical: None  . Transportation needs - non-medical: None  Occupational History  . Occupation: unemployed    Fish farm manager: unemployed  Tobacco Use  . Smoking status: Former Smoker    Packs/day: 1.00    Years: 15.00    Pack years: 15.00    Types: Cigarettes    Last attempt to quit: 12/21/2012    Years since quitting: 4.1  . Smokeless tobacco: Former Systems developer    Types: [place] date: 12/05/1990  Substance and Sexual Activity  . Alcohol use: No  . Drug use: Yes    Frequency: 2.0 times per week    Types: Marijuana    Comment: for pain/anxiety; occ 02/28/17  . Sexual activity: No    Birth control/protection: None  Other Topics Concern  . None  Social History Narrative  . None    Review of Systems: General: Negative for anorexia, weight loss, fever, chills, fatigue, weakness. ENT: Negative for hoarseness, difficulty swallowing , nasal congestion. CV: Negative for chest pain, angina, palpitations, dyspnea on exertion, peripheral edema.  Respiratory: Negative for dyspnea at rest, dyspnea on exertion, cough, sputum, wheezing.  GI: See history of present illness. MS: Admits chronic knee pain.  Derm: Negative for rash or itching.  Neuro: Negative for memory loss, confusion.  Endo: Negative for unusual weight change.  Heme: Negative for bruising or bleeding. Allergy: Negative for rash or  hives.   Physical Exam: BP 132/84   Pulse 64   Temp 97.7 F (36.5 C) (Oral)   Ht 5\' 10"  (1.778 m)   Wt 257 lb 6.4 oz (116.8 kg)   BMI 36.93 kg/m  General:   Alert and oriented. Pleasant and cooperative. Well-nourished and well-developed.  Eyes:  Without icterus, sclera clear and conjunctiva pink.  Ears:  Normal auditory acuity. Cardiovascular:  S1, S2 present without murmurs appreciated. Extremities without clubbing or edema. Respiratory:  Clear to auscultation bilaterally. No wheezes, rales, or rhonchi. No distress.  Gastrointestinal:  +BS, soft, non-tender and non-distended. No HSM noted. No guarding or rebound. No masses appreciated.  Rectal:  Deferred  Musculoskalatal:  Symmetrical without gross deformities. Neurologic:  Alert and oriented x4;  grossly normal neurologically. Psych:  Alert and cooperative. Normal mood and affect. Heme/Lymph/Immune: No excessive bruising noted.  02/28/2017 1:58 PM   Disclaimer: This note was dictated with voice recognition software. Similar sounding words can inadvertently be transcribed and may not be corrected upon review.

## 2017-02-28 NOTE — Assessment & Plan Note (Signed)
The patient has hepatitis C cirrhosis.  This is based on ultrasound elastography which demonstrated some F3 and F4.  He is likely cured of hepatitis C based on his 4-week post treatment initiation labs.  We will check labs today as per below to document sustained virologic response.  If his hep C is cured he should not have any further damage.  His liver function appears to be normal at this time.  Discussed routine care for cirrhosis to include office visit, labs, imaging every 6 months.  He is agreeable.  Generally asymptomatic from a hepatic standpoint at this time.

## 2017-02-28 NOTE — Assessment & Plan Note (Signed)
Patient has a family history of colon cancer in his father who was diagnosed at age 48 and is now deceased.  His last colonoscopy was in 2009 which was essentially normal he is currently overdue for high risk screening based on current recommendations.  We will proceed with colonoscopy at this time.  Proceed with TCS on propofol/MAC with Dr. Gala Romney in near future: the risks, benefits, and alternatives have been discussed with the patient in detail. The patient states understanding and desires to proceed.  The patient is not on any anticoagulants.  He is on Xanax, Klonopin.  He has been on intermittent pain medication due to knee surgery.  He admits occasional/rare marijuana use.  We will plan for propofol/MAC to promote adequate sedation.

## 2017-02-28 NOTE — Patient Instructions (Signed)
1. Have your labs drawn when you are able to. 2. We will schedule your colonoscopy for you. 3. Return for follow-up in 6 months. 4. Call us if you have any questions or concerns.   Happy Thanksgiving!!!

## 2017-02-28 NOTE — Progress Notes (Signed)
cc'ed to pcp °

## 2017-03-05 ENCOUNTER — Telehealth: Payer: Self-pay | Admitting: *Deleted

## 2017-03-05 ENCOUNTER — Encounter: Payer: Self-pay | Admitting: *Deleted

## 2017-03-05 NOTE — Telephone Encounter (Signed)
Called spoke with pt and is aware preop appt scheduled for 03/29/17 at 10:00am. Letter also mailed.

## 2017-03-12 ENCOUNTER — Encounter: Payer: Self-pay | Admitting: Orthopedic Surgery

## 2017-03-12 ENCOUNTER — Ambulatory Visit: Payer: Medicaid Other | Admitting: Orthopedic Surgery

## 2017-03-23 LAB — CBC WITH DIFFERENTIAL/PLATELET
Basophils Absolute: 43 cells/uL (ref 0–200)
Basophils Relative: 0.7 %
Eosinophils Absolute: 122 cells/uL (ref 15–500)
Eosinophils Relative: 2 %
HCT: 45.4 % (ref 38.5–50.0)
Hemoglobin: 15.7 g/dL (ref 13.2–17.1)
Lymphs Abs: 1946 cells/uL (ref 850–3900)
MCH: 32.4 pg (ref 27.0–33.0)
MCHC: 34.6 g/dL (ref 32.0–36.0)
MCV: 93.6 fL (ref 80.0–100.0)
MPV: 10.4 fL (ref 7.5–12.5)
Monocytes Relative: 4.9 %
Neutro Abs: 3691 cells/uL (ref 1500–7800)
Neutrophils Relative %: 60.5 %
Platelets: 226 10*3/uL (ref 140–400)
RBC: 4.85 10*6/uL (ref 4.20–5.80)
RDW: 12.8 % (ref 11.0–15.0)
Total Lymphocyte: 31.9 %
WBC mixed population: 299 cells/uL (ref 200–950)
WBC: 6.1 10*3/uL (ref 3.8–10.8)

## 2017-03-23 LAB — COMPREHENSIVE METABOLIC PANEL
AG Ratio: 1.6 (calc) (ref 1.0–2.5)
ALT: 20 U/L (ref 9–46)
AST: 19 U/L (ref 10–40)
Albumin: 4.5 g/dL (ref 3.6–5.1)
Alkaline phosphatase (APISO): 37 U/L — ABNORMAL LOW (ref 40–115)
BUN: 15 mg/dL (ref 7–25)
CO2: 27 mmol/L (ref 20–32)
Calcium: 9.3 mg/dL (ref 8.6–10.3)
Chloride: 104 mmol/L (ref 98–110)
Creat: 1.09 mg/dL (ref 0.60–1.35)
Globulin: 2.9 g/dL (calc) (ref 1.9–3.7)
Glucose, Bld: 105 mg/dL (ref 65–139)
Potassium: 4.8 mmol/L (ref 3.5–5.3)
Sodium: 138 mmol/L (ref 135–146)
Total Bilirubin: 0.7 mg/dL (ref 0.2–1.2)
Total Protein: 7.4 g/dL (ref 6.1–8.1)

## 2017-03-23 LAB — HEPATITIS C RNA QUANTITATIVE
HCV Quantitative Log: <1.18 Log IU/mL
HCV RNA, PCR, QN: <15 IU/mL

## 2017-03-23 LAB — AFP TUMOR MARKER: AFP-Tumor Marker: 1.8 ng/mL (ref <6.1)

## 2017-03-23 LAB — PROTIME-INR
INR: 1
Prothrombin Time: 10.4 s (ref 9.0–11.5)

## 2017-03-28 ENCOUNTER — Encounter (HOSPITAL_COMMUNITY): Payer: Self-pay

## 2017-03-28 ENCOUNTER — Other Ambulatory Visit: Payer: Self-pay

## 2017-03-28 ENCOUNTER — Encounter (HOSPITAL_COMMUNITY): Admission: RE | Admit: 2017-03-28 | Payer: Medicaid Other | Source: Ambulatory Visit

## 2017-03-28 NOTE — Patient Instructions (Signed)
Jamie [name]  03/28/2017     @PREFPERIOPPHARMACY @   Your procedure is scheduled on 04/05/17.  Report to [name] at 11:00 A.M.  Call this number if you have problems the morning of surgery:  [phone]   Remember:  Do not eat food or drink liquids after midnight.  Take these medicines the morning of surgery with A SIP OF WATER : Klonopin.      STOP Cataflam.   Do not wear jewelry, make-up or nail polish.  Do not wear lotions, powders, or perfumes, or deodorant.  Do not shave 48 hours prior to surgery.  Men may shave face and neck.  Do not bring valuables to the hospital.  [facility] is not responsible for any belongings or valuables.  Contacts, dentures or bridgework may not be worn into surgery.  Leave your suitcase in the car.  After surgery it may be brought to your room.  For patients admitted to the hospital, discharge time will be determined by your treatment team.  Patients discharged the day of surgery will not be allowed to drive home.   Name and phone number of your driver:    Special instructions:    Please read over the following fact sheets that you were given. Anesthesia Post-op Instructions    Colonoscopy, Adult A colonoscopy is an exam to look at the entire large intestine. During the exam, a lubricated, bendable tube is inserted into the anus and then passed into the rectum, colon, and other parts of the large intestine. A colonoscopy is often done as a part of normal colorectal screening or in response to certain symptoms, such as anemia, persistent diarrhea, abdominal pain, and blood in the stool. The exam can help screen for and diagnose medical problems, including:  Tumors.  Polyps.  Inflammation.  Areas of bleeding.  Tell a health care provider about:  Any allergies you have.  All medicines you are taking, including vitamins, herbs, eye drops, creams, and over-the-counter medicines.  Any problems you or family members have had with  anesthetic medicines.  Any blood disorders you have.  Any surgeries you have had.  Any medical conditions you have.  Any problems you have had passing stool. What are the risks? Generally, this is a safe procedure. However, problems may occur, including:  Bleeding.  A tear in the intestine.  A reaction to medicines given during the exam.  Infection (rare).  What happens before the procedure? Eating and drinking restrictions Follow instructions from your health care provider about eating and drinking, which may include:  A few days before the procedure - follow a low-fiber diet. Avoid nuts, seeds, dried fruit, raw fruits, and vegetables.  1-3 days before the procedure - follow a clear liquid diet. Drink only clear liquids, such as clear broth or bouillon, black coffee or tea, clear juice, clear soft drinks or sports drinks, gelatin dessert, and popsicles. Avoid any liquids that contain red or purple dye.  On the day of the procedure - do not eat or drink anything during the 2 hours before the procedure, or within the time period that your health care provider recommends.  Bowel prep If you were prescribed an oral bowel prep to clean out your colon:  Take it as told by your health care provider. Starting the day before your procedure, you will need to drink a large amount of medicated liquid. The liquid will cause you to have multiple loose stools until your stool is almost clear or  light green.  If your skin or anus gets irritated from diarrhea, you may use these to relieve the irritation: ? Medicated wipes, such as adult wet wipes with aloe and vitamin E. ? A skin soothing-product like petroleum jelly.  If you vomit while drinking the bowel prep, take a break for up to 60 minutes and then begin the bowel prep again. If vomiting continues and you cannot take the bowel prep without vomiting, call your health care provider.  General instructions  Ask your health care provider  about changing or stopping your regular medicines. This is especially important if you are taking diabetes medicines or blood thinners.  Plan to have someone take you home from the hospital or clinic. What happens during the procedure?  An IV tube may be inserted into one of your veins.  You will be given medicine to help you relax (sedative).  To reduce your risk of infection: ? Your health care team will wash or sanitize their hands. ? Your anal area will be washed with soap.  You will be asked to lie on your side with your knees bent.  Your health care provider will lubricate a long, thin, flexible tube. The tube will have a camera and a light on the end.  The tube will be inserted into your anus.  The tube will be gently eased through your rectum and colon.  Air will be delivered into your colon to keep it open. You may feel some pressure or cramping.  The camera will be used to take images during the procedure.  A small tissue sample may be removed from your body to be examined under a microscope (biopsy). If any potential problems are found, the tissue will be sent to a lab for testing.  If small polyps are found, your health care provider may remove them and have them checked for cancer cells.  The tube that was inserted into your anus will be slowly removed. The procedure may vary among health care providers and hospitals. What happens after the procedure?  Your blood pressure, heart rate, breathing rate, and blood oxygen level will be monitored until the medicines you were given have worn off.  Do not drive for 24 hours after the exam.  You may have a small amount of blood in your stool.  You may pass gas and have mild abdominal cramping or bloating due to the air that was used to inflate your colon during the exam.  It is up to you to get the results of your procedure. Ask your health care provider, or the department performing the procedure, when your results will  be ready. This information is not intended to replace advice given to you by your health care provider. Make sure you discuss any questions you have with your health care provider. Document Released: 03/24/2000 Document Revised: 01/26/2016 Document Reviewed: 06/08/2015 Elsevier Interactive Patient Education  2018 [place] Anesthesia, Adult, Care After These instructions provide you with information about caring for yourself after your procedure. Your health care provider may also give you more specific instructions. Your treatment has been planned according to current medical practices, but problems sometimes occur. Call your health care provider if you have any problems or questions after your procedure. What can I expect after the procedure? After the procedure, it is common to have:  Vomiting.  A sore throat.  Mental slowness.  It is common to feel:  Nauseous.  Cold or shivery.  Sleepy.  Tired.  Sore or achy, even in parts of your body where you did not have surgery.  Follow these instructions at home: For at least 24 hours after the procedure:  Do not: ? Participate in activities where you could fall or become injured. ? Drive. ? Use heavy machinery. ? Drink alcohol. ? Take sleeping pills or medicines that cause drowsiness. ? Make important decisions or sign legal documents. ? Take care of children on your own.  Rest. Eating and drinking  If you vomit, drink water, juice, or soup when you can drink without vomiting.  Drink enough fluid to keep your urine clear or pale yellow.  Make sure you have little or no nausea before eating solid foods.  Follow the diet recommended by your health care provider. General instructions  Have a responsible adult stay with you until you are awake and alert.  Return to your normal activities as told by your health care provider. Ask your health care provider what activities are safe for you.  Take  over-the-counter and prescription medicines only as told by your health care provider.  If you smoke, do not smoke without supervision.  Keep all follow-up visits as told by your health care provider. This is important. Contact a health care provider if:  You continue to have nausea or vomiting at home, and medicines are not helpful.  You cannot drink fluids or start eating again.  You cannot urinate after 8-12 hours.  You develop a skin rash.  You have fever.  You have increasing redness at the site of your procedure. Get help right away if:  You have difficulty breathing.  You have chest pain.  You have unexpected bleeding.  You feel that you are having a life-threatening or urgent problem. This information is not intended to replace advice given to you by your health care provider. Make sure you discuss any questions you have with your health care provider. Document Released: 07/03/2000 Document Revised: 08/30/2015 Document Reviewed: 03/11/2015 Elsevier Interactive Patient Education  [employer].

## 2017-03-29 ENCOUNTER — Encounter (HOSPITAL_COMMUNITY)
Admission: RE | Admit: 2017-03-29 | Discharge: 2017-03-29 | Disposition: A | Discharge status: Home / Self Care | Payer: Medicaid Other | Source: Ambulatory Visit | Attending: Internal Medicine | Admitting: Internal Medicine

## 2017-04-04 ENCOUNTER — Encounter (HOSPITAL_COMMUNITY): Payer: Self-pay | Admitting: *Deleted

## 2017-04-05 ENCOUNTER — Ambulatory Visit (HOSPITAL_COMMUNITY): Payer: Medicaid Other | Admitting: Anesthesiology

## 2017-04-05 ENCOUNTER — Ambulatory Visit (HOSPITAL_COMMUNITY)
Admission: RE | Admit: 2017-04-05 | Discharge: 2017-04-05 | Disposition: A | Discharge status: Home / Self Care | Payer: Medicaid Other | Source: Ambulatory Visit | Attending: Internal Medicine | Admitting: Internal Medicine

## 2017-04-05 ENCOUNTER — Encounter (HOSPITAL_COMMUNITY)
Admission: RE | Disposition: A | Discharge status: Home / Self Care | Payer: Self-pay | Source: Ambulatory Visit | Attending: Internal Medicine

## 2017-04-05 ENCOUNTER — Encounter (HOSPITAL_COMMUNITY): Payer: Self-pay

## 2017-04-05 DIAGNOSIS — Z79899 Other long term (current) drug therapy: Secondary | ICD-10-CM | POA: Diagnosis not present

## 2017-04-05 DIAGNOSIS — Z800 Family history of malignant neoplasm of digestive organs: Secondary | ICD-10-CM | POA: Diagnosis not present

## 2017-04-05 DIAGNOSIS — R7303 Prediabetes: Secondary | ICD-10-CM | POA: Insufficient documentation

## 2017-04-05 DIAGNOSIS — I10 Essential (primary) hypertension: Secondary | ICD-10-CM | POA: Diagnosis not present

## 2017-04-05 DIAGNOSIS — K219 Gastro-esophageal reflux disease without esophagitis: Secondary | ICD-10-CM | POA: Diagnosis not present

## 2017-04-05 DIAGNOSIS — Z1212 Encounter for screening for malignant neoplasm of rectum: Secondary | ICD-10-CM

## 2017-04-05 DIAGNOSIS — Z87891 Personal history of nicotine dependence: Secondary | ICD-10-CM | POA: Diagnosis not present

## 2017-04-05 DIAGNOSIS — Z1211 Encounter for screening for malignant neoplasm of colon: Secondary | ICD-10-CM | POA: Diagnosis not present

## 2017-04-05 DIAGNOSIS — F419 Anxiety disorder, unspecified: Secondary | ICD-10-CM | POA: Insufficient documentation

## 2017-04-05 HISTORY — PX: COLONOSCOPY WITH PROPOFOL: SHX5780

## 2017-04-05 SURGERY — COLONOSCOPY WITH PROPOFOL
Anesthesia: Monitor Anesthesia Care

## 2017-04-05 MED ORDER — FENTANYL CITRATE (PF) 100 MCG/2ML IJ SOLN
25.0000 ug | Freq: Once | INTRAMUSCULAR | Status: AC
  Administered 2017-04-05: 25 ug via INTRAVENOUS

## 2017-04-05 MED ORDER — CHLORHEXIDINE GLUCONATE CLOTH 2 % EX PADS: 6.0000 | MEDICATED_PAD | Freq: Once | CUTANEOUS | Status: DC

## 2017-04-05 MED ORDER — MIDAZOLAM HCL 5 MG/5ML IJ SOLN
INTRAMUSCULAR | Status: DC | PRN
  Administered 2017-04-05: 2 mg via INTRAVENOUS

## 2017-04-05 MED ORDER — PROPOFOL 500 MG/50ML IV EMUL
INTRAVENOUS | Status: DC | PRN
  Administered 2017-04-05: 125 ug/kg/min via INTRAVENOUS

## 2017-04-05 MED ORDER — MIDAZOLAM HCL 2 MG/2ML IJ SOLN
INTRAMUSCULAR | Status: AC
  Filled 2017-04-05: qty 2

## 2017-04-05 MED ORDER — LACTATED RINGERS IV SOLN
INTRAVENOUS | Status: DC
  Administered 2017-04-05 (×2): via INTRAVENOUS

## 2017-04-05 MED ORDER — FENTANYL CITRATE (PF) 100 MCG/2ML IJ SOLN
INTRAMUSCULAR | Status: AC
  Filled 2017-04-05: qty 2

## 2017-04-05 MED ORDER — MIDAZOLAM HCL 2 MG/2ML IJ SOLN
1.0000 mg | INTRAMUSCULAR | Status: AC
  Administered 2017-04-05: 2 mg via INTRAVENOUS

## 2017-04-05 MED ORDER — PROPOFOL 10 MG/ML IV BOLUS
INTRAVENOUS | Status: AC
  Filled 2017-04-05: qty 40

## 2017-04-05 NOTE — Anesthesia Postprocedure Evaluation (Signed)
Anesthesia Post Note  Patient: Jamie [name]  Procedure(s) Performed: COLONOSCOPY WITH PROPOFOL (N/A )  Patient location during evaluation: PACU Anesthesia Type: MAC Level of consciousness: awake Pain management: pain level controlled Vital Signs Assessment: post-procedure vital signs reviewed and stable Respiratory status: spontaneous breathing, nonlabored ventilation and respiratory function stable Cardiovascular status: blood pressure returned to baseline Postop Assessment: no apparent nausea or vomiting Anesthetic complications: no     Last Vitals:  Vitals:   04/05/17 1030 04/05/17 1035  BP: 126/86 125/86  Pulse:    Resp: 19 (!) 27  Temp:    SpO2: 95% 96%    Last Pain:  Vitals:   04/05/17 0935  TempSrc: Oral                 [name]

## 2017-04-05 NOTE — Transfer of Care (Signed)
Immediate Anesthesia Transfer of Care Note  Patient: Jamie [name]  Procedure(s) Performed: COLONOSCOPY WITH PROPOFOL (N/A )  Patient Location: PACU  Anesthesia Type:MAC  Level of Consciousness: drowsy  Airway & Oxygen Therapy: Patient Spontanous Breathing and Patient connected to nasal cannula oxygen  Post-op Assessment: Report given to RN, Post -op Vital signs reviewed and stable and Patient moving all extremities  Post vital signs: Reviewed and stable  Last Vitals:  Vitals:   04/05/17 1030 04/05/17 1035  BP: 126/86 125/86  Pulse:    Resp: 19 (!) 27  Temp:    SpO2: 95% 96%    Last Pain:  Vitals:   04/05/17 0935  TempSrc: Oral      Patients Stated Pain Goal: 7 (63/14/97 0263)  Complications: No apparent anesthesia complications

## 2017-04-05 NOTE — Discharge Instructions (Signed)
°  Colonoscopy Discharge Instructions  Read the instructions outlined below and refer to this sheet in the next few weeks. These discharge instructions provide you with general information on caring for yourself after you leave the hospital. Your doctor may also give you specific instructions. While your treatment has been planned according to the most current medical practices available, unavoidable complications occasionally occur. If you have any problems or questions after discharge, call Dr. Gala Romney at [phone]. ACTIVITY  You may resume your regular activity, but move at a slower pace for the next 24 hours.   Take frequent rest periods for the next 24 hours.   Walking will help get rid of the air and reduce the bloated feeling in your belly (abdomen).   No driving for 24 hours (because of the medicine (anesthesia) used during the test).    Do not sign any important legal documents or operate any machinery for 24 hours (because of the anesthesia used during the test).  NUTRITION  Drink plenty of fluids.   You may resume your normal diet as instructed by your doctor.   Begin with a light meal and progress to your normal diet. Heavy or fried foods are harder to digest and may make you feel sick to your stomach (nauseated).   Avoid alcoholic beverages for 24 hours or as instructed.  MEDICATIONS  You may resume your normal medications unless your doctor tells you otherwise.  WHAT YOU CAN EXPECT TODAY  Some feelings of bloating in the abdomen.   Passage of more gas than usual.   Spotting of blood in your stool or on the toilet paper.  IF YOU HAD POLYPS REMOVED DURING THE COLONOSCOPY:  No aspirin products for 7 days or as instructed.   No alcohol for 7 days or as instructed.   Eat a soft diet for the next 24 hours.  FINDING OUT THE RESULTS OF YOUR TEST Not all test results are available during your visit. If your test results are not back during the visit, make an appointment  with your caregiver to find out the results. Do not assume everything is normal if you have not heard from your caregiver or the medical facility. It is important for you to follow up on all of your test results.  SEEK IMMEDIATE MEDICAL ATTENTION IF:  You have more than a spotting of blood in your stool.   Your belly is swollen (abdominal distention).   You are nauseated or vomiting.   You have a temperature over 101.   You have abdominal pain or discomfort that is severe or gets worse throughout the day.    Keep appointment for February 2019  Repeat screening colonoscopy in 5 years.

## 2017-04-05 NOTE — Op Note (Signed)
[facility] Patient Name: Jamie [name] Procedure Date: 04/05/2017 10:33 AM MRN: [id number] Date of Birth: [birth year] Attending MD: Jamie [name] , MD CSN: [id number] Age: 46 Admit Type: Outpatient Procedure:                Colonoscopy Indications:              Screening in patient at increased risk: Family                            history of 1st-degree relative with colorectal                            cancer before age 44 years Providers:                Jamie Richards, MD, Jamie Del, RN, Jamie [name], Technician, Jamie [name], Technician Referring MD:              Medicines:                Propofol per Anesthesia Complications:            No immediate complications. Estimated Blood Loss:     Estimated blood loss: none. Procedure:                Pre-Anesthesia Assessment:                           - Prior to the procedure, a History and Physical                            was performed, and patient medications and                            allergies were reviewed. The patient's tolerance of                            previous anesthesia was also reviewed. The risks                            and benefits of the procedure and the sedation                            options and risks were discussed with the patient.                            All questions were answered, and informed consent                            was obtained. Prior Anticoagulants: The patient has                            taken no previous anticoagulant or antiplatelet  agents. ASA Grade Assessment: II - A patient with                            mild systemic disease. After reviewing the risks                            and benefits, the patient was deemed in                            satisfactory condition to undergo the procedure.                           After obtaining informed consent, the colonoscope   was passed under direct vision. Throughout the                            procedure, the patient's blood pressure, pulse, and                            oxygen saturations were monitored continuously. The                            EC-[id number] ([id number]) scope was introduced through                            the and advanced to the the cecum, identified by                            appendiceal orifice and ileocecal valve. The                            colonoscopy was performed without difficulty. The                            patient tolerated the procedure well. The quality                            of the bowel preparation was adequate. The                            ileocecal valve, appendiceal orifice, and rectum                            were photographed. Scope In: 11:03:27 AM Scope Out: 11:12:30 AM Scope Withdrawal Time: 0 hours 7 minutes 14 seconds  Total Procedure Duration: 0 hours 9 minutes 3 seconds  Findings:      The perianal and digital rectal examinations were normal.      The colon (entire examined portion) appeared normal.      The retroflexed view of the distal rectum and anal verge was normal and       showed no anal or rectal abnormalities. Impression:               - The entire examined colon is normal.                           -  The distal rectum and anal verge are normal on                            retroflexion view.                           - No specimens collected. Moderate Sedation:      Moderate (conscious) sedation was personally administered by an       anesthesia professional. The following parameters were monitored: oxygen       saturation, heart rate, blood pressure, respiratory rate, EKG, adequacy       of pulmonary ventilation, and response to care. Total physician       intraservice time was 17 minutes. Recommendation:           - Patient has a contact number available for                            emergencies. The signs and symptoms of  potential                            delayed complications were discussed with the                            patient. Return to normal activities tomorrow.                            Written discharge instructions were provided to the                            patient.                           - Resume previous diet.                           - Continue present medications.                           - Repeat colonoscopy in 5 years for screening                            purposes.                           - Return to GI office in 2 months. Procedure Code(s):        --- Professional ---                           [phone], Colonoscopy, flexible; diagnostic, including                            collection of specimen(s) by brushing or washing,                            when performed (separate procedure) Diagnosis Code(s):        --- Professional ---  Z80.0, Family history of malignant neoplasm of                            digestive organs CPT copyright 2016 American Medical Association. All rights reserved. The codes documented in this report are preliminary and upon coder review may  be revised to meet current compliance requirements. Jamie [name]. Jamie Stecklein, MD Jamie Richards, MD 04/05/2017 11:18:58 AM This report has been signed electronically. Number of Addenda: 0

## 2017-04-05 NOTE — Anesthesia Preprocedure Evaluation (Signed)
Anesthesia Evaluation  Patient identified by MRN, date of birth, ID band Patient awake    Reviewed: Allergy & Precautions, H&P , NPO status , Patient's Chart, lab work & pertinent test results  Airway Mallampati: I  TM Distance: >3 FB Neck ROM: Full    Dental  (+) Teeth Intact   Pulmonary former smoker,    breath sounds clear to auscultation       Cardiovascular hypertension,  Rhythm:Regular Rate:Normal     Neuro/Psych PSYCHIATRIC DISORDERS Anxiety    GI/Hepatic negative GI ROS, GERD  ,(+) Hepatitis -  Endo/Other    Renal/GU      Musculoskeletal   Abdominal   Peds  Hematology   Anesthesia Other Findings   Reproductive/Obstetrics                             Anesthesia Physical Anesthesia Plan  ASA: III  Anesthesia Plan: MAC   Post-op Pain Management:    Induction: Intravenous  PONV Risk Score and Plan:   Airway Management Planned: Simple Face Mask  Additional Equipment:   Intra-op Plan:   Post-operative Plan:   Informed Consent: I have reviewed the patients History and Physical, chart, labs and discussed the procedure including the risks, benefits and alternatives for the proposed anesthesia with the patient or authorized representative who has indicated his/her understanding and acceptance.     Plan Discussed with:   Anesthesia Plan Comments:         Anesthesia Quick Evaluation

## 2017-04-05 NOTE — H&P (Signed)
@LOGO @   Primary Care Physician:  Lemmie Evens, MD Primary Gastroenterologist:  Dr. Gala Romney  Pre-Procedure History & Physical: HPI:  Jamie [name] is a 46 y.o. male is here for a screening colonoscopy. Last exam 2009. Family history significant in that father succumbed to colon cancer in his 34s. No GI symptoms currently.  Past Medical History:  Diagnosis Date  . Anxiety   . Arthritis   . GERD (gastroesophageal reflux disease)    occ  . HCV antibody positive   . Hypertension    "Dr Karie Kirks took me off meds"  diet control  . Pre-diabetes     Past Surgical History:  Procedure Laterality Date  . BACK SURGERY    . CARPAL TUNNEL RELEASE Right 06/25/2013   Procedure: CARPAL TUNNEL RELEASE;  Surgeon: Carole Civil, MD;  Location: [facility];  Service: Orthopedics;  Laterality: Right;  . CARPAL TUNNEL RELEASE Left 07/25/2013   Procedure: LEFT CARPAL TUNNEL RELEASE;  Surgeon: Carole Civil, MD;  Location: [facility];  Service: Orthopedics;  Laterality: Left;  . ELBOW SURGERY Left   . FOOT SURGERY    . HERNIA REPAIR Right    inguinal- age 56  . KNEE ARTHROSCOPY WITH MEDIAL MENISECTOMY Left 12/07/2016   Procedure: KNEE ARTHROSCOPY WITH MEDIAL MENISECTOMY;  Surgeon: Carole Civil, MD;  Location: [facility];  Service: Orthopedics;  Laterality: Left;  . KNEE SURGERY    . left elbow    . LUMBAR LAMINECTOMY/DECOMPRESSION MICRODISCECTOMY Left 10/06/2013   Procedure: Left Lumbar Three-four microdiskectomy;  Surgeon: Ophelia Charter, MD;  Location: [facility];  Service: Neurosurgery;  Laterality: Left;  Left Lumbar Three-four microdiskectomy  . right foot     forgein body removal  . right knee  orif right patella [place] 1993  . SEPTOPLASTY      Prior to Admission medications   Medication Sig Start Date End Date Taking? Authorizing Provider  acetaminophen (TYLENOL) 325 MG tablet Take 650 mg by mouth every 6 (six) hours as needed for mild pain or headache.   Yes [provider]  ALPRAZolam (XANAX) 1 MG tablet TAKE 1 TABLET BY MOUTH EVERY NIGJHT AT BEDTIME 10/19/16  Yes [provider]  clonazePAM (KLONOPIN) 1 MG tablet Take 1 mg by mouth 3 (three) times daily.    Yes [provider]  diclofenac (CATAFLAM) 50 MG tablet Take 1 tablet (50 mg total) by mouth 2 (two) times daily. 10/30/16  Yes Carole Civil, MD    Allergies as of 02/28/2017  . (No Known Allergies)    Family History  Problem Relation Age of Onset  . Heart disease Unknown   . Arthritis Unknown   . Cancer Unknown   . Asthma Unknown   . Diabetes Unknown   . Kidney disease Unknown   . Colon cancer Father 35    Social History   Socioeconomic History  . Marital status: Legally Separated    Spouse name: is seperated  . Number of children: Not on file  . Years of education: 8th grade   . Highest education level: Not on file  Social Needs  . Financial resource strain: Not on file  . Food insecurity - worry: Not on file  . Food insecurity - inability: Not on file  . Transportation needs - medical: Not on file  . Transportation needs - non-medical: Not on file  Occupational History  . Occupation: unemployed    Fish farm manager: unemployed  Tobacco Use  . Smoking status: Former  Smoker    Packs/day: 1.00    Years: 15.00    Pack years: 15.00    Types: Cigarettes    Last attempt to quit: 12/21/2012    Years since quitting: 4.2  . Smokeless tobacco: Former Systems developer    Types: [place] date: 12/05/1990  Substance and Sexual Activity  . Alcohol use: No  . Drug use: Yes    Frequency: 2.0 times per week    Types: Marijuana    Comment: for pain/anxiety; occ 02/28/17  . Sexual activity: No    Birth control/protection: None  Other Topics Concern  . Not on file  Social History Narrative  . Not on file    Review of Systems: See HPI, otherwise negative ROS  Physical Exam: BP 126/87   Pulse 62   Temp 97.8 F (36.6 C) (Oral)   Resp 14   SpO2 95%  General:   Alert,   Well-developed, well-nourished, pleasant and cooperative in NAD Lungs:  Clear throughout to auscultation.   No wheezes, crackles, or rhonchi. No acute distress. Heart:  Regular rate and rhythm; no murmurs, clicks, rubs,  or gallops. Abdomen:  Soft, nontender and nondistended. No masses, hepatosplenomegaly or hernias noted. Normal bowel sounds, without guarding, and without rebound.    Impression/Plan: Jamie [name] is now here to undergo a screening colonoscopy.  High risk screening examination.  Risks, benefits, limitations, imponderables and alternatives regarding colonoscopy have been reviewed with the patient. Questions have been answered. All parties agreeable.     Notice:  This dictation was prepared with Dragon dictation along with smaller phrase technology. Any transcriptional errors that result from this process are unintentional and may not be corrected upon review.

## 2017-04-09 ENCOUNTER — Encounter (HOSPITAL_COMMUNITY): Payer: Self-pay | Admitting: Internal Medicine

## 2017-04-13 ENCOUNTER — Ambulatory Visit: Payer: Medicaid Other | Admitting: Orthopedic Surgery

## 2017-04-13 VITALS — BP 147/91 | HR 73 | Ht 70.0 in | Wt 258.0 lb

## 2017-04-13 DIAGNOSIS — Z9889 Other specified postprocedural states: Secondary | ICD-10-CM

## 2017-04-13 DIAGNOSIS — M25562 Pain in left knee: Secondary | ICD-10-CM

## 2017-04-13 DIAGNOSIS — M25462 Effusion, left knee: Secondary | ICD-10-CM

## 2017-04-13 NOTE — Progress Notes (Addendum)
Progress Note   Patient ID: Jamie [name], male   DOB: 1970/05/02, 47 y.o.   MRN: [id number]  Chief Complaint  Patient presents with  . Follow-up    Recheck on left knee, DOS 12-07-16.    HPI 47 year old male history of left knee arthroscopy with recurrent effusions.  He has osteoarthritis is ages preclusive for him having a knee replacement.  He is currently on ibuprofen and Tylenol with breakthrough pain.  He did have a secondary fall he went to see his primary physician no x-ray was obtained he has been able to ambulate well with pain  Summation diffuse left knee pain aching pain is moderate to severe pain is been present now for several days pain is not responsive to Tylenol and ibuprofen  Review of Systems  Constitutional: Negative for fever.  Musculoskeletal: Positive for joint pain.  Neurological: Negative for tingling.   No outpatient medications have been marked as taking for the 04/13/17 encounter (Office Visit) with Carole Civil, MD.    Past Medical History:  Diagnosis Date  . Anxiety   . Arthritis   . GERD (gastroesophageal reflux disease)    occ  . HCV antibody positive   . Hypertension    "Dr Karie Kirks took me off meds"  diet control  . Pre-diabetes      No Known Allergies  BP (!) 147/91   Pulse 73   Ht 5\' 10"  (1.778 m)   Wt 258 lb (117 kg)   BMI 37.02 kg/m    Physical Exam  Constitutional: He is oriented to person, place, and time. He appears well-developed and well-nourished. No distress.  Musculoskeletal:       Legs: Neurological: He is alert and oriented to person, place, and time.  Skin: Skin is warm and dry. Capillary refill takes less than 2 seconds. No rash noted. He is not diaphoretic. No erythema.  Psychiatric: He has a normal mood and affect.    Ortho Exam   Medical decision-making  Imaging: No images today  Encounter Diagnoses  Name Primary?  . S/P left knee arthroscopy 12/07/16   . Acute pain of left knee Yes  . Effusion of  knee joint, left     Procedure note injection and aspiration left knee joint  Verbal consent was obtained to aspirate and inject the left knee joint   Timeout was completed to confirm the site of aspiration and injection  An 18-gauge needle was used to aspirate the left knee joint from a suprapatellar lateral approach.  The medications used were 40 mg of Depo-Medrol and 1% lidocaine 3 cc  Anesthesia was provided by ethyl chloride and the skin was prepped with alcohol.  After cleaning the skin with alcohol an 18-gauge needle was used to aspirate the right knee joint.  We obtained 40 cc of clear fluid We followed this by injection of 40 mg of Depo-Medrol and 3 cc 1% lidocaine.  There were no complications. A sterile bandage was applied.     Arther Abbott, MD 04/13/2017 10:11 AM

## 2017-04-18 ENCOUNTER — Ambulatory Visit: Payer: Medicaid Other | Admitting: Orthopedic Surgery

## 2017-05-07 ENCOUNTER — Telehealth: Payer: Self-pay | Admitting: Orthopedic Surgery

## 2017-05-07 NOTE — Telephone Encounter (Signed)
Call from patient's Primary Care, Dr Karie Kirks - per Mardene Celeste, (1) verifying as to whether pain medication prescribed at last visit 04/13/17 (has Dr Ruthe Mannan notes) and (2) asking if Dr Aline Brochure was referring to pain management - states this is what patient relayed.  Ph# [phone].

## 2017-05-07 NOTE — Telephone Encounter (Signed)
I called Jamie [name], advised her Jamie. Aline Brochure did not prescribe any hydrocodone, and there was not any indication of discussion regarding pain management. Jamie [name] and Jamie [name] want you to know that Jamie [name] will start Jamie [name] on Dronabinol today for his pain management.   To you FYI

## 2017-05-17 ENCOUNTER — Other Ambulatory Visit: Payer: Self-pay | Admitting: Orthopedic Surgery

## 2017-07-01 ENCOUNTER — Emergency Department (HOSPITAL_COMMUNITY)
Admission: EM | Admit: 2017-07-01 | Discharge: 2017-07-01 | Disposition: A | Discharge status: Home / Self Care | Payer: Medicaid Other | Attending: Emergency Medicine | Admitting: Emergency Medicine

## 2017-07-01 ENCOUNTER — Encounter (HOSPITAL_COMMUNITY): Payer: Self-pay | Admitting: Emergency Medicine

## 2017-07-01 ENCOUNTER — Emergency Department (HOSPITAL_COMMUNITY): Payer: Medicaid Other

## 2017-07-01 DIAGNOSIS — I10 Essential (primary) hypertension: Secondary | ICD-10-CM | POA: Diagnosis not present

## 2017-07-01 DIAGNOSIS — M25562 Pain in left knee: Secondary | ICD-10-CM | POA: Diagnosis present

## 2017-07-01 DIAGNOSIS — M1732 Unilateral post-traumatic osteoarthritis, left knee: Secondary | ICD-10-CM | POA: Insufficient documentation

## 2017-07-01 DIAGNOSIS — F17210 Nicotine dependence, cigarettes, uncomplicated: Secondary | ICD-10-CM | POA: Diagnosis not present

## 2017-07-01 DIAGNOSIS — G8929 Other chronic pain: Secondary | ICD-10-CM | POA: Insufficient documentation

## 2017-07-01 DIAGNOSIS — Z79899 Other long term (current) drug therapy: Secondary | ICD-10-CM | POA: Diagnosis not present

## 2017-07-01 DIAGNOSIS — M1712 Unilateral primary osteoarthritis, left knee: Secondary | ICD-10-CM

## 2017-07-01 HISTORY — DX: Pain in unspecified knee: M25.569

## 2017-07-01 HISTORY — DX: Other chronic pain: G89.29

## 2017-07-01 HISTORY — DX: Dorsalgia, unspecified: M54.9

## 2017-07-01 HISTORY — DX: Radiculopathy, lumbar region: M54.16

## 2017-07-01 MED ORDER — HYDROCODONE-ACETAMINOPHEN 5-325 MG PO TABS: 1.0000 | ORAL_TABLET | ORAL | 0 refills | Status: DC | PRN

## 2017-07-01 NOTE — Discharge Instructions (Signed)
Continue using your home knee brace to support your knee joint.  Ice applied as frequently as this comfortable will help with your acute injury in this joint. You may take the hydrocodone prescribed for pain relief.  This will make you drowsy - do not drive within 4 hours of taking this medication.  Your x-ray today is negative for any new injury from yesterday's fall.

## 2017-07-01 NOTE — ED Provider Notes (Addendum)
[facility] EMERGENCY DEPARTMENT Provider Note   CSN: [id number] Arrival date & time: 07/01/17  1205     History   Chief Complaint Chief Complaint  Patient presents with  . Leg Pain    HPI Jamie [name] is a 47 y.o. 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 Aline Brochure presenting with acute on chronic left knee pain after a near fall yesterday.  He was attempting to climb into his truck when he slipped fell backwards and twisted the left knee and fell on his buttocks with persistent needle sharp pain radiating from his lower medial knee joint space to his upper patella area since.  His pain is worse with movement and with weightbearing.  He had a leftover Percocet from a prior surgery and took that yesterday evening which offered some relief.  He denies any other injury or new pain.  He wears a Velcro knee brace and walks with a cane at baseline.  HPI  Past Medical History:  Diagnosis Date  . Anxiety   . Arthritis   . Chronic back pain   . Chronic knee pain   . GERD (gastroesophageal reflux disease)    occ  . HCV antibody positive   . Hypertension    "Dr Karie Kirks took me off meds"  diet control  . Lumbar radiculopathy   . Pre-diabetes     Patient Active Problem List   Diagnosis Date Noted  . S/P left knee arthroscopy 12/07/16 04/13/2017  . Family history of colon cancer 02/28/2017  . Hepatic cirrhosis ([place]) 02/28/2017  . Derangement of posterior horn of medial meniscus of left knee   . Chondromalacia patellae, left knee   . Chondromalacia of medial femoral condyle, left   . Hepatitis C 06/15/2016  . Lumbar herniated disc 10/06/2013  . Carpal tunnel syndrome 07/28/2013    Past Surgical History:  Procedure Laterality Date  . BACK SURGERY    . CARPAL TUNNEL RELEASE Right 06/25/2013   Procedure: CARPAL TUNNEL RELEASE;  Surgeon: Carole Civil, MD;  Location: [facility];  Service: Orthopedics;  Laterality: Right;    . CARPAL TUNNEL RELEASE Left 07/25/2013   Procedure: LEFT CARPAL TUNNEL RELEASE;  Surgeon: Carole Civil, MD;  Location: [facility];  Service: Orthopedics;  Laterality: Left;  . COLONOSCOPY WITH PROPOFOL N/A 04/05/2017   Procedure: COLONOSCOPY WITH PROPOFOL;  Surgeon: Daneil Dolin, MD;  Location: [facility];  Service: Endoscopy;  Laterality: N/A;  12:30pm-pt notified to arrive at 9:15am for 10:45am procedure per KF  . ELBOW SURGERY Left   . FOOT SURGERY    . HERNIA REPAIR Right    inguinal- age 49  . KNEE ARTHROSCOPY WITH MEDIAL MENISECTOMY Left 12/07/2016   Procedure: KNEE ARTHROSCOPY WITH MEDIAL MENISECTOMY;  Surgeon: Carole Civil, MD;  Location: [facility];  Service: Orthopedics;  Laterality: Left;  . KNEE SURGERY    . left elbow    . LUMBAR LAMINECTOMY/DECOMPRESSION MICRODISCECTOMY Left 10/06/2013   Procedure: Left Lumbar Three-four microdiskectomy;  Surgeon: Ophelia Charter, MD;  Location: [place] [facility]UR[facility] ORS;  Service: Neurosurgery;  Laterality: Left;  Left Lumbar Three-four microdiskectomy  . right foot     forgein body removal  . right knee  orif right patella Keeling 1993  . SEPTOPLASTY          Home Medications    Prior to Admission medications   Medication Sig Start Date End Date Taking? Authorizing Provider  acetaminophen (  TYLENOL) 325 MG tablet Take 650 mg by mouth every 6 (six) hours as needed for mild pain or headache.    [provider]  ALPRAZolam Duanne Moron) 1 MG tablet TAKE 1 TABLET BY MOUTH EVERY NIGJHT AT BEDTIME 10/19/16   [provider]  clonazePAM (KLONOPIN) 1 MG tablet Take 1 mg by mouth 3 (three) times daily.     [provider]  diclofenac (CATAFLAM) 50 MG tablet TAKE 1 TABLET BY MOUTH TWICE DAILY 05/18/17   Carole Civil, MD  HYDROcodone-acetaminophen (NORCO/VICODIN) 5-325 MG tablet Take 1 tablet by mouth every 4 (four) hours as needed. 07/01/17   Evalee Jefferson, PA-C    Family History Family History  Problem Relation Age of  Onset  . Heart disease Unknown   . Arthritis Unknown   . Cancer Unknown   . Asthma Unknown   . Diabetes Unknown   . Kidney disease Unknown   . Colon cancer Father 65    Social History Social History   Tobacco Use  . Smoking status: Current Every Day Smoker    Packs/day: 1.00    Years: 15.00    Pack years: 15.00    Types: Cigarettes    Last attempt to quit: 12/21/2012    Years since quitting: 4.5  . Smokeless tobacco: Former Systems developer    Types: [place] date: 12/05/1990  Substance Use Topics  . Alcohol use: No  . Drug use: Yes    Frequency: 2.0 times per week    Types: Marijuana    Comment: for pain/anxiety; occ 02/28/17     Allergies   Patient has no known allergies.   Review of Systems Review of Systems  Constitutional: Negative for fever.  Musculoskeletal: Positive for arthralgias and joint swelling. Negative for myalgias.  Neurological: Negative for weakness and numbness.     Physical Exam Updated Vital Signs BP (!) 147/85   Pulse 62   Temp 97.8 F (36.6 C) (Oral)   Resp 16   Wt 115.2 kg (254 lb)   SpO2 100%   BMI 36.45 kg/m   Physical Exam  Constitutional: He appears well-developed and well-nourished.  HENT:  Head: Atraumatic.  Neck: Normal range of motion.  Cardiovascular:  Pulses equal bilaterally  Musculoskeletal: He exhibits edema and tenderness.       Left knee: He exhibits swelling and bony tenderness. He exhibits no ecchymosis, no deformity, no erythema, no LCL laxity and no MCL laxity. Tenderness found. Medial joint line tenderness noted.  Neurological: He is alert. He has normal strength. He displays normal reflexes. No sensory deficit.  Skin: Skin is warm and dry.  Psychiatric: He has a normal mood and affect.     ED Treatments / Results  Labs (all labs ordered are listed, but only abnormal results are displayed) Labs Reviewed - No data to display  EKG None  Radiology Dg Knee Complete 4 Views Left  Result Date:  07/01/2017 CLINICAL DATA:  Medial LEFT knee pain after twist injury and near fall yesterday, prior ACL surgery in 2018 EXAM: LEFT KNEE - COMPLETE 4+ VIEW COMPARISON:  11/03/2016 FINDINGS: Osseous mineralization normal. Tricompartmental degenerative changes greatest at medial compartment where the greatest degree of joint space narrowing and spur formation are identified. No acute fracture, dislocation, or bone destruction. No knee joint effusion. IMPRESSION: Degenerative changes LEFT knee. No acute bony abnormalities. Electronically Signed   By: Lavonia Dana M.D.   On: 07/01/2017 14:12    Procedures Procedures (including critical care  time)  Medications Ordered in ED Medications - No data to display   Initial Impression / Assessment and Plan / ED Course  I have reviewed the triage vital signs and the nursing notes.  Pertinent labs & imaging results that were available during my care of the patient were reviewed by me and considered in my medical decision making (see chart for details).     Patient with acute on chronic left knee pain secondary to a fall which occurred yesterday.  Imaging is negative for any acute injuries, this was discussed with patient.  He was encouraged to continue using the knee braces that he is prescribed by his orthopedist.  Hydrocodone was prescribed after reviewing the [facility] narcotic database.  Ice and elevation was recommended, follow-up with his orthopedist as needed if symptoms persist or worsen.  Final Clinical Impressions(s) / ED Diagnoses   Final diagnoses:  Acute pain of left knee  Osteoarthritis of left knee, unspecified osteoarthritis type    ED Discharge Orders        Ordered    HYDROcodone-acetaminophen (NORCO/VICODIN) 5-325 MG tablet  Every 4 hours PRN     07/01/17 1511       Evalee Jefferson, PA-C 07/01/17 1512    Evalee Jefferson, PA-C 07/01/17 1640    Noemi Chapel, MD 07/02/17 2125

## 2017-07-01 NOTE — ED Triage Notes (Signed)
Pt reports that he has history on both legs and wears braces on both legs. Followed by Dr Aline Brochure. Pt states he went to get in truck yesterday and lost balance and fell backwards, landing on buttocks. Feels like he pulled something in left knee. Left Knee is stinging.

## 2017-07-09 ENCOUNTER — Ambulatory Visit: Payer: Medicaid Other | Admitting: Orthopedic Surgery

## 2017-07-09 ENCOUNTER — Encounter: Payer: Self-pay | Admitting: Orthopedic Surgery

## 2017-07-09 VITALS — BP 150/100 | HR 74 | Ht 70.0 in | Wt 258.0 lb

## 2017-07-09 DIAGNOSIS — Z9889 Other specified postprocedural states: Secondary | ICD-10-CM | POA: Diagnosis not present

## 2017-07-09 DIAGNOSIS — M25462 Effusion, left knee: Secondary | ICD-10-CM

## 2017-07-09 NOTE — Progress Notes (Signed)
Progress Note   Patient ID: Jamie [name], male   DOB: Feb 25, 1971, 47 y.o.   MRN: [id number]  Chief Complaint  Patient presents with  . Follow-up    ER follow up on left knee, DOI 06-28-17.    Jamie [name] is 47 years old he had knee arthroscopy has had recurrent effusions in his left knee he fell again a few weeks ago went to the emergency room with a lot of knee swelling  Comes in today complains of left knee swelling but persistent pain and stinging over the anterior aspect of his left knee with no catching locking or giving way.  No prior treatment other than going to the ER with x-rays were negative    Review of Systems  Constitutional: Negative.   Respiratory: Negative.   Cardiovascular: Negative.   Musculoskeletal: Positive for back pain.   No outpatient medications have been marked as taking for the 07/09/17 encounter (Office Visit) with Carole Civil, MD.    Past Medical History:  Diagnosis Date  . Anxiety   . Arthritis   . Chronic back pain   . Chronic knee pain   . GERD (gastroesophageal reflux disease)    occ  . HCV antibody positive   . Hypertension    "Dr Karie Kirks took me off meds"  diet control  . Lumbar radiculopathy   . Pre-diabetes      No Known Allergies  BP (!) 150/100   Pulse 74   Ht 5\' 10"  (1.778 m)   Wt 258 lb (117 kg)   BMI 37.02 kg/m    Physical Exam  Constitutional: He is oriented to person, place, and time. He appears well-developed and well-nourished.  Vital signs have been reviewed and are stable. Gen. appearance the patient is well-developed and well-nourished with normal grooming and hygiene.   Neurological: He is alert and oriented to person, place, and time.  Skin: Skin is warm and dry. No erythema.  Psychiatric: He has a normal mood and affect.  Vitals reviewed.   Ortho Exam  Patient came in with a slightly abnormal gait which is chronic for him.  He has a left knee effusion moderate his flexion is about 125 degrees cruciate  ligaments collateral ligaments test is stable quadricep strength was normal skin intact no rash no erythema pulse was normal pulse and perfusion were normal no peripheral edema was noted Medical decision-making  Imaging: No new images were taken  Hospital films dated March 24 show fairly significant narrowing on the medial side this is most noted on the oblique x-ray overall limb alignment tibial femoral is about 5 degrees valgus patella looks normal height  Joint effusion was noted at that time   Encounter Diagnoses  Name Primary?  . S/P left knee arthroscopy 12/07/16 Yes  . Effusion of knee joint, left    Procedure note injection and aspiration left knee joint  Verbal consent was obtained to aspirate and inject the left knee joint   Timeout was completed to confirm the site of aspiration and injection  An 18-gauge needle was used to aspirate the left knee joint from a suprapatellar lateral approach.  The medications used were 40 mg of Depo-Medrol and 1% lidocaine 3 cc  Anesthesia was provided by ethyl chloride and the skin was prepped with alcohol.  After cleaning the skin with alcohol an 18-gauge needle was used to aspirate the right knee joint.  We obtained 25 cc of fluid  We followed this by injection of 40 mg  of Depo-Medrol and 3 cc 1% lidocaine.  There were no complications. A sterile bandage was applied.   Recommend 2 knee braces one on each side standard open range should be fine  Arther Abbott, MD 07/09/2017 4:43 PM

## 2017-07-10 ENCOUNTER — Telehealth: Payer: Self-pay | Admitting: Orthopedic Surgery

## 2017-07-10 ENCOUNTER — Other Ambulatory Visit: Payer: Self-pay | Admitting: Orthopedic Surgery

## 2017-07-10 DIAGNOSIS — Z9889 Other specified postprocedural states: Secondary | ICD-10-CM

## 2017-07-10 DIAGNOSIS — M25562 Pain in left knee: Secondary | ICD-10-CM

## 2017-07-10 MED ORDER — ACETAMINOPHEN-CODEINE 300-30 MG PO TABS: 1.0000 | ORAL_TABLET | Freq: Four times a day (QID) | ORAL | 0 refills | Status: AC | PRN

## 2017-07-10 NOTE — Telephone Encounter (Signed)
Jamie [name] called this afternoon stating that he was given new prescriptions for knee braces per his requests.  He said that he took them to [place] and they told him that his Medicaid would not pay for them but once every 3 years   He wanted me to ask if he could get the braces from our office.  I told him that I would ask but if they wouldn't pay for them at [facility] then I didn't think they would pay for them here.  He also wants to ask about getting some pain medication.  He didn't ask for that while he was here yesterday.  Would you call him and advise him regarding braces and pain medication.  Thanks

## 2017-07-10 NOTE — Telephone Encounter (Signed)
1 week only

## 2017-07-10 NOTE — Progress Notes (Signed)
Coding

## 2017-07-10 NOTE — Telephone Encounter (Signed)
I called patient he states he can not get new knee braces for another couple years, Medicaid will only cover for braces every three years. He states he knows a lady that may be able to sew them up for him where the stitching is coming out.  He has asked about meds, I advised him to use the Diclofenac, he states he is using it, but requests some Tylenol with codeine, states Dr Aline Brochure has given to him in the past  [place]

## 2017-07-11 NOTE — Telephone Encounter (Signed)
I called him to advise.  

## 2017-08-25 ENCOUNTER — Encounter (HOSPITAL_COMMUNITY): Payer: Self-pay

## 2017-08-25 ENCOUNTER — Emergency Department (HOSPITAL_COMMUNITY): Payer: Medicaid Other

## 2017-08-25 ENCOUNTER — Emergency Department (HOSPITAL_COMMUNITY)
Admission: EM | Admit: 2017-08-25 | Discharge: 2017-08-25 | Disposition: A | Discharge status: Home / Self Care | Payer: Medicaid Other | Attending: Emergency Medicine | Admitting: Emergency Medicine

## 2017-08-25 DIAGNOSIS — Z79899 Other long term (current) drug therapy: Secondary | ICD-10-CM | POA: Insufficient documentation

## 2017-08-25 DIAGNOSIS — Z87891 Personal history of nicotine dependence: Secondary | ICD-10-CM | POA: Diagnosis not present

## 2017-08-25 DIAGNOSIS — M25562 Pain in left knee: Secondary | ICD-10-CM | POA: Diagnosis not present

## 2017-08-25 DIAGNOSIS — G894 Chronic pain syndrome: Secondary | ICD-10-CM | POA: Insufficient documentation

## 2017-08-25 DIAGNOSIS — G8929 Other chronic pain: Secondary | ICD-10-CM

## 2017-08-25 DIAGNOSIS — I10 Essential (primary) hypertension: Secondary | ICD-10-CM | POA: Diagnosis not present

## 2017-08-25 MED ORDER — HYDROCODONE-ACETAMINOPHEN 5-325 MG PO TABS
1.0000 | ORAL_TABLET | Freq: Once | ORAL | Status: AC
  Administered 2017-08-25: 1 via ORAL
  Filled 2017-08-25: qty 1

## 2017-08-25 MED ORDER — HYDROCODONE-ACETAMINOPHEN 5-325 MG PO TABS: ORAL_TABLET | ORAL | 0 refills | Status: DC

## 2017-08-25 NOTE — ED Notes (Signed)
Pt reports knee surgeries in past by Dr Aline Brochure and Luna Glasgow  Has a several day history of pain and swelling to his R knee Knee is hot to touch and swollen  Pt reports he has an appt on Monday for eval of fluid on his knee

## 2017-08-25 NOTE — Discharge Instructions (Addendum)
Continue to apply your ice wrap on/off.  Minimal weight bearing.  Be sure to follow-up with Dr. Aline Brochure on Monday

## 2017-08-25 NOTE — ED Triage Notes (Signed)
Pt reports history of sx on both knees.Follwed by Dr Aline Brochure. Pt reports increase pain in left knee. Typically has fluid that has to be drained off knee. Pain started bothering him 2 days. Feels like burning pain under knee cap and is swollen

## 2017-08-26 NOTE — ED Provider Notes (Signed)
[facility] EMERGENCY DEPARTMENT Provider Note   CSN: [id number] Arrival date & time: 08/25/17  3710     History   Chief Complaint Chief Complaint  Patient presents with  . Knee Pain    HPI Jamie [name] is a 47 y.o. male.  HPI  Jamie [name] is a 47 y.o. male who presents to the Emergency Department complaining of worsening of his chronic left knee pain.  He reports chronic pain to both knees, but he has been experiencing an increase in pain to the left knee for 2 days.  Pain associated with swelling of the knee. Pain worse with weight bearing.  No recent injury or trauma.  He is currently under the care of Dr. Aline Brochure.  Denies fever, chills, redness of the joint, numbness.     Past Medical History:  Diagnosis Date  . Anxiety   . Arthritis   . Chronic back pain   . Chronic knee pain   . GERD (gastroesophageal reflux disease)    occ  . HCV antibody positive   . Hypertension    "Dr Karie Kirks took me off meds"  diet control  . Lumbar radiculopathy   . Pre-diabetes     Patient Active Problem List   Diagnosis Date Noted  . S/P left knee arthroscopy 12/07/16 04/13/2017  . Family history of colon cancer 02/28/2017  . Hepatic cirrhosis ([place]) 02/28/2017  . Derangement of posterior horn of medial meniscus of left knee   . Chondromalacia patellae, left knee   . Chondromalacia of medial femoral condyle, left   . Hepatitis C 06/15/2016  . Lumbar herniated disc 10/06/2013  . Carpal tunnel syndrome 07/28/2013    Past Surgical History:  Procedure Laterality Date  . BACK SURGERY    . CARPAL TUNNEL RELEASE Right 06/25/2013   Procedure: CARPAL TUNNEL RELEASE;  Surgeon: Carole Civil, MD;  Location: [facility];  Service: Orthopedics;  Laterality: Right;  . CARPAL TUNNEL RELEASE Left 07/25/2013   Procedure: LEFT CARPAL TUNNEL RELEASE;  Surgeon: Carole Civil, MD;  Location: [facility];  Service: Orthopedics;  Laterality: Left;  . COLONOSCOPY WITH PROPOFOL N/A 04/05/2017   Procedure: COLONOSCOPY WITH PROPOFOL;  Surgeon: Daneil Dolin, MD;  [facility];  Service: Endoscopy;  Laterality: N/A;  12:30pm-pt notified to arrive at 9:15am for 10:45am procedure per KF  . ELBOW SURGERY Left   . FOOT SURGERY    . HERNIA REPAIR Right    inguinal- age 76  . KNEE ARTHROSCOPY WITH MEDIAL MENISECTOMY Left 12/07/2016   Procedure: KNEE ARTHROSCOPY WITH MEDIAL MENISECTOMY;  Surgeon: Carole Civil, MD;  Location: [facility];  Service: Orthopedics;  Laterality: Left;  . KNEE SURGERY    . left elbow    . LUMBAR LAMINECTOMY/DECOMPRESSION MICRODISCECTOMY Left 10/06/2013   Procedure: Left Lumbar Three-four microdiskectomy;  Surgeon: Ophelia Charter, MD;  Location: [place] NEURO ORS;  Service: Neurosurgery;  Laterality: Left;  Left Lumbar Three-four microdiskectomy  . right foot     forgein body removal  . right knee  orif right patella Ke[place] 1993  . SEPTOPLASTY          Home Medications    Prior to Admission medications   Medication Sig Start Date End Date Taking? Authorizing Provider  acetaminophen (TYLENOL) 325 MG tablet Take 650 mg by mouth every 6 (six) hours as needed for mild pain or headache.    [provider]  ALPRAZolam (XANAX) 1 MG tablet TAKE 1 TABLET BY MOUTH EVERY  NIGJHT AT BEDTIME 10/19/16   [provider]  clonazePAM (KLONOPIN) 1 MG tablet Take 1 mg by mouth 3 (three) times daily.     [provider]  diclofenac (CATAFLAM) 50 MG tablet TAKE 1 TABLET BY MOUTH TWICE DAILY 05/18/17   Carole Civil, MD  HYDROcodone-acetaminophen (NORCO/VICODIN) 5-325 MG tablet Take one tab po q 4 hrs prn pain 08/25/17   Kem Parkinson, PA-C    Family History Family History  Problem Relation Age of Onset  . Heart disease Unknown   . Arthritis Unknown   . Cancer Unknown   . Asthma Unknown   . Diabetes Unknown   . Kidney disease Unknown   . Colon cancer Father 45    Social History Social History   Tobacco Use  . Smoking status:  Former Smoker    Packs/day: 1.00    Years: 15.00    Pack years: 15.00    Types: Cigarettes    Last attempt to quit: 12/21/2012    Years since quitting: 4.6  . Smokeless tobacco: Former Systems developer    Types: [place] date: 12/05/1990  Substance Use Topics  . Alcohol use: No  . Drug use: Yes    Frequency: 2.0 times per week    Types: Marijuana    Comment: for pain/anxiety; occ 02/28/17     Allergies   Patient has no known allergies.   Review of Systems Review of Systems  Constitutional: Negative for chills and fever.  Musculoskeletal: Positive for arthralgias (left knee pain) and joint swelling.  Skin: Negative for color change and wound.  Neurological: Negative for weakness and numbness.  All other systems reviewed and are negative.    Physical Exam Updated Vital Signs BP (!) 148/99 (BP Location: Right Arm)   Pulse (!) 59   Temp 97.9 F (36.6 C) (Oral)   Resp 16   Wt 111.6 kg (246 lb)   SpO2 97%   BMI 35.30 kg/m   Physical Exam  Constitutional: He is oriented to person, place, and time. He appears well-developed and well-nourished. No distress.  HENT:  Head: Atraumatic.  Cardiovascular: Normal rate, regular rhythm and intact distal pulses.  Pulmonary/Chest: Effort normal and breath sounds normal. No respiratory distress.  Musculoskeletal: He exhibits edema and tenderness. He exhibits no deformity.  ttp of anterior left knee.  Moderate edema.  No palpable effusion.  No erythema.  Negative drawer sign.  Neurological: He is alert and oriented to person, place, and time. No sensory deficit.  Skin: Skin is warm. Capillary refill takes less than 2 seconds. No rash noted.  Nursing note and vitals reviewed.    ED Treatments / Results  Labs (all labs ordered are listed, but only abnormal results are displayed) Labs Reviewed - No data to display  EKG None  Radiology Dg Knee 2 Views Left  Result Date: 08/25/2017 CLINICAL DATA:  LEFT knee pain.  History of joint  effusion. EXAM: LEFT KNEE - 1-2 VIEW COMPARISON:  None. FINDINGS: No fracture of the proximal tibia or distal femur. Patella is normal. No joint effusion. There is osteophytic spurring along the margin of the medial and lateral compartment. IMPRESSION: 1. No acute findings or effusion. 2. Moderate osteophytosis. Electronically Signed   By: Suzy Bouchard M.D.   On: 08/25/2017 11:15    Procedures Procedures (including critical care time)  Medications Ordered in ED Medications  HYDROcodone-acetaminophen (NORCO/VICODIN) 5-325 MG per tablet 1 tablet (1 tablet Oral Given 08/25/17 1231)  Initial Impression / Assessment and Plan / ED Course  I have reviewed the triage vital signs and the nursing notes.  Pertinent labs & imaging results that were available during my care of the patient were reviewed by me and considered in my medical decision making (see chart for details).     Likely acute on chronic knee pain. NV intact.   No concerning sx for septic joint. Requesting pain control until he can see Dr. Aline Brochure on Monday.   Final Clinical Impressions(s) / ED Diagnoses   Final diagnoses:  Chronic pain of left knee    ED Discharge Orders        Ordered    HYDROcodone-acetaminophen (NORCO/VICODIN) 5-325 MG tablet     08/25/17 [address] PA-C 08/26/17 2100    Nat Christen, MD 08/27/17 1538

## 2017-08-28 ENCOUNTER — Ambulatory Visit: Payer: Medicaid Other | Admitting: Nurse Practitioner

## 2017-08-28 ENCOUNTER — Encounter: Payer: Self-pay | Admitting: Nurse Practitioner

## 2017-10-17 ENCOUNTER — Ambulatory Visit: Payer: Medicaid Other | Admitting: Orthopedic Surgery

## 2017-10-17 ENCOUNTER — Encounter: Payer: Self-pay | Admitting: Orthopedic Surgery

## 2017-10-17 VITALS — BP 132/85 | HR 68 | Ht 71.0 in | Wt 248.0 lb

## 2017-10-17 DIAGNOSIS — M25462 Effusion, left knee: Secondary | ICD-10-CM | POA: Diagnosis not present

## 2017-10-17 DIAGNOSIS — M25562 Pain in left knee: Secondary | ICD-10-CM

## 2017-10-17 DIAGNOSIS — Z9889 Other specified postprocedural states: Secondary | ICD-10-CM | POA: Diagnosis not present

## 2017-10-17 DIAGNOSIS — Z87891 Personal history of nicotine dependence: Secondary | ICD-10-CM

## 2017-10-17 MED ORDER — ACETAMINOPHEN-CODEINE #3 300-30 MG PO TABS: 1.0000 | ORAL_TABLET | ORAL | 0 refills | Status: DC | PRN

## 2017-10-17 NOTE — Patient Instructions (Addendum)
ICE   REST   TRY TO STOP SMOKING    Steps to Quit Smoking Smoking tobacco can be bad for your health. It can also affect almost every organ in your body. Smoking puts you and people around you at risk for many serious long-lasting (chronic) diseases. Quitting smoking is hard, but it is one of the best things that you can do for your health. It is never too late to quit. What are the benefits of quitting smoking? When you quit smoking, you lower your risk for getting serious diseases and conditions. They can include:  Lung cancer or lung disease.  Heart disease.  Stroke.  Heart attack.  Not being able to have children (infertility).  Weak bones (osteoporosis) and broken bones (fractures).  If you have coughing, wheezing, and shortness of breath, those symptoms may get better when you quit. You may also get sick less often. If you are pregnant, quitting smoking can help to lower your chances of having a baby of low birth weight. What can I do to help me quit smoking? Talk with your doctor about what can help you quit smoking. Some things you can do (strategies) include:  Quitting smoking totally, instead of slowly cutting back how much you smoke over a period of time.  Going to in-person counseling. You are more likely to quit if you go to many counseling sessions.  Using resources and support systems, such as: ? Database administrator with a Social worker. ? Phone quitlines. ? Careers information officer. ? Support groups or group counseling. ? Text messaging programs. ? Mobile phone apps or applications.  Taking medicines. Some of these medicines may have nicotine in them. If you are pregnant or breastfeeding, do not take any medicines to quit smoking unless your doctor says it is okay. Talk with your doctor about counseling or other things that can help you.  Talk with your doctor about using more than one strategy at the same time, such as taking medicines while you are also going to  in-person counseling. This can help make quitting easier. What things can I do to make it easier to quit? Quitting smoking might feel very hard at first, but there is a lot that you can do to make it easier. Take these steps:  Talk to your family and friends. Ask them to support and encourage you.  Call phone quitlines, reach out to support groups, or work with a Social worker.  Ask people who smoke to not smoke around you.  Avoid places that make you want (trigger) to smoke, such as: ? Bars. ? Parties. ? Smoke-break areas at work.  Spend time with people who do not smoke.  Lower the stress in your life. Stress can make you want to smoke. Try these things to help your stress: ? Getting regular exercise. ? Deep-breathing exercises. ? Yoga. ? Meditating. ? Doing a body scan. To do this, close your eyes, focus on one area of your body at a time from head to toe, and notice which parts of your body are tense. Try to relax the muscles in those areas.  Download or buy apps on your mobile phone or tablet that can help you stick to your quit plan. There are many free apps, such as QuitGuide from the [employer] Office manager for Disease Control and Prevention). You can find more support from smokefree.gov and other websites.  This information is not intended to replace advice given to you by your health care provider. Make sure you discuss  any questions you have with your health care provider. Document Released: 01/21/2009 Document Revised: 11/23/2015 Document Reviewed: 08/11/2014 Elsevier Interactive Patient Education  2018 [employer].

## 2017-10-17 NOTE — Progress Notes (Addendum)
NEW CONSULTATION OFFICE VISI  Chief complaint swelling and pain left knee  Consultation requested by Dr. Karie Kirks  47 year old male presents for evaluation of recurrent effusion left knee  He complains of left knee pain for several days to a week dull sharp pain decreased range of motion severe in intensity.  He is on oral diclofenac 50 mg as well as Tylenol.  He is smoking again but says he wants to stop and will stop on his own   Review of Systems  Constitutional: Negative for chills and fever.  Skin: Negative.   Neurological: Negative.      Past Medical History:  Diagnosis Date  . Anxiety   . Arthritis   . Chronic back pain   . Chronic knee pain   . GERD (gastroesophageal reflux disease)    occ  . HCV antibody positive   . Hypertension    "Dr Karie Kirks took me off meds"  diet control  . Lumbar radiculopathy   . Pre-diabetes     Past Surgical History:  Procedure Laterality Date  . BACK SURGERY    . CARPAL TUNNEL RELEASE Right 06/25/2013   Procedure: CARPAL TUNNEL RELEASE;  Surgeon: Carole Civil, MD;  Location: [facility];  Service: Orthopedics;  Laterality: Right;  . CARPAL TUNNEL RELEASE Left 07/25/2013   Procedure: LEFT CARPAL TUNNEL RELEASE;  Surgeon: Carole Civil, MD;  Location: [facility];  Service: Orthopedics;  Laterality: Left;  . COLONOSCOPY WITH PROPOFOL N/A 04/05/2017   Procedure: COLONOSCOPY WITH PROPOFOL;  Surgeon: Daneil Dolin, MD;  Location: [facility];  Service: Endoscopy;  Laterality: N/A;  12:30pm-pt notified to arrive at 9:15am for 10:45am procedure per KF  . ELBOW SURGERY Left   . FOOT SURGERY    . HERNIA REPAIR Right    inguinal- age 2  . KNEE ARTHROSCOPY WITH MEDIAL MENISECTOMY Left 12/07/2016   Procedure: KNEE ARTHROSCOPY WITH MEDIAL MENISECTOMY;  Surgeon: Carole Civil, MD;  Location: [facility];  Service: Orthopedics;  Laterality: Left;  . KNEE SURGERY    . left elbow    . LUMBAR LAMINECTOMY/DECOMPRESSION MICRODISCECTOMY Left  10/06/2013   Procedure: Left Lumbar Three-four microdiskectomy;  Surgeon: Ophelia Charter, MD;  Location: [place] [facility];  Service: Neurosurgery;  Laterality: Left;  Left Lumbar Three-four microdiskectomy  . right foot     forgein body removal  . right knee  orif right patella Keeling 1993  . SEPTOPLASTY      Family History  Problem Relation Age of Onset  . Heart disease Unknown   . Arthritis Unknown   . Cancer Unknown   . Asthma Unknown   . Diabetes Unknown   . Kidney disease Unknown   . Colon cancer Father 50   Social History   Tobacco Use  . Smoking status: Former Smoker    Packs/day: 1.00    Years: 15.00    Pack years: 15.00    Types: Cigarettes    Last attempt to quit: 12/21/2012    Years since quitting: 4.8  . Smokeless tobacco: Former Systems developer    Types: [place] date: 12/05/1990  Substance Use Topics  . Alcohol use: No  . Drug use: Yes    Frequency: 2.0 times per week    Types: Marijuana    Comment: for pain/anxiety; occ 02/28/17    No Known Allergies  Current Meds  Medication Sig  . acetaminophen (TYLENOL) 325 MG tablet Take 650 mg by mouth every 6 (six) hours as needed  for mild pain or headache.  . diazepam (VALIUM) 5 MG tablet TK 1 T PO NOT MORE THAN TWICE DAILY FOR ANXIETY OR SLEEP  . diclofenac (CATAFLAM) 50 MG tablet TAKE 1 TABLET BY MOUTH TWICE DAILY  . dronabinol (MARINOL) 5 MG capsule TK 1 C PO BID    BP 132/85   Pulse 68   Ht 5\' 11"  (1.803 m)   Wt 248 lb (112.5 kg)   BMI 34.59 kg/m   Physical Exam  Constitutional: He is oriented to person, place, and time. He appears well-developed and well-nourished.  Musculoskeletal:       Left knee: He exhibits effusion.  Neurological: He is alert and oriented to person, place, and time. Gait abnormal.  He is limping favoring his left leg  Psychiatric: He has a normal mood and affect. His behavior is normal. Judgment and thought content normal.    Right Knee Exam   Muscle Strength  The patient has  normal right knee strength.  Tenderness  The patient is experiencing no tenderness.   Range of Motion  Extension: normal  Flexion: normal   Tests  McMurray:  Medial - negative Lateral - negative Varus: negative Valgus: negative Drawer:  Anterior - negative    Posterior - negative  Other  Erythema: absent Scars: absent Sensation: normal Pulse: present Swelling: none   Left Knee Exam   Muscle Strength  The patient has normal left knee strength.  Tenderness  The patient is experiencing tenderness in the medial joint line and lateral joint line.  Range of Motion  Extension: normal  Flexion:  90 normal   Tests  McMurray:  Medial - negative Lateral - negative Varus: negative Valgus: negative Drawer:  Anterior - negative     Posterior - negative  Other  Erythema: absent Scars: absent Sensation: normal Pulse: present Swelling: none Effusion: effusion present        MEDICAL DECISION SECTION  Xrays were done at [facility] no acute findings  My independent reading of xrays:  No acute findings were seen on the views of the left knee 2 views were done on the 18th.  He has severe wear of the medial side he has osteophytes around the joint porosis on the medial compartment as well  Encounter Diagnoses  Name Primary?  . S/P left knee arthroscopy 12/07/16   . Effusion of knee joint, left Yes  . Acute pain of left knee   . Smoking history    The patient was counseled on the risks and benefits of not smoking versus smoking.  He says he can stop on his own.  Patient information education was completed.   PLAN: (Rx., injectx, surgery, frx, mri/ct) Aspiration injection left knee  Procedure note injection and aspiration left knee joint  Verbal consent was obtained to aspirate and inject the left knee joint   Timeout was completed to confirm the site of aspiration and injection  An 18-gauge needle was used to aspirate the left knee joint from a  suprapatellar lateral approach.  The medications used were 40 mg of Depo-Medrol and 1% lidocaine 3 cc  Anesthesia was provided by ethyl chloride and the skin was prepped with alcohol.  After cleaning the skin with alcohol an 18-gauge needle was used to aspirate the right knee joint.  We obtained 75 cc of fluid, clear yellow  We followed this by injection of 40 mg of Depo-Medrol and 3 cc 1% lidocaine.  There were no complications. A sterile bandage was  applied.  Meds ordered this encounter  Medications  . acetaminophen-codeine (TYLENOL #3) 300-30 MG tablet    Sig: Take 1 tablet by mouth every 4 (four) hours as needed for moderate pain.    Dispense:  28 tablet    Refill:  0  NARX SCORES  Narcotic  360  Sedative  501  Stimulant  000  Explanation and Guidance  OVERDOSE RISK SCORE  470  (Range 000-999)  Explanation and Guidance  ADDITIONAL RISK INDICATORS ( 1 )  >= 5 opioid or sedative providers in any year in the last 2 years  Explanation and Guidance Fill Date ID Written Drug Qty Days Prescriber Rx # Pharmacy Refill Daily Dose * Pymt Type PMP 10/02/2017 2 10/02/2017 Dronabinol 5 Mg Capsule  [address] [id number] [facility]0019[facility] 0 Private Pay [place] 10/02/2017 2 10/02/2017 Diazepam 5 Mg Tablet  [address] [id number] [facility] (0019) 0 1.00 LME Medicaid [place] 09/02/2017 2 07/04/2017 Diazepam 5 Mg Tablet  [address] [id number] [facility] (0019) 1 1.00 LME Medicaid [place] 08/25/2017 2 08/25/2017 Hydrocodone-Acetamin 5-325 Mg  12 2 Ta Tri [id number] [facility] (0019) 0 30.00 MME Medicaid [place]   Arther Abbott, MD  10/17/2017 12:38 PM

## 2017-11-01 IMAGING — US US EXTREM LOW VENOUS*L*
1 series · 13 of 24 positions shown · non-contrast
Comparison: None.

CLINICAL DATA: Left lower extremity edema for the past 6 months.
History of injury involving the left lower extremity. History of
smoking and varicose veins. Evaluate for DVT



[Series 1: us extrem low venous*left* · 0.07mm/px · 13 of 33 slices shown]
[im 1/33]
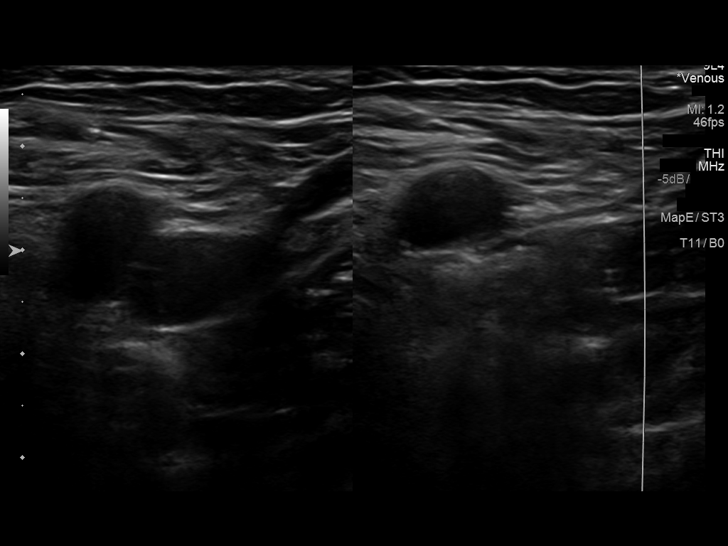
[im 3/33]
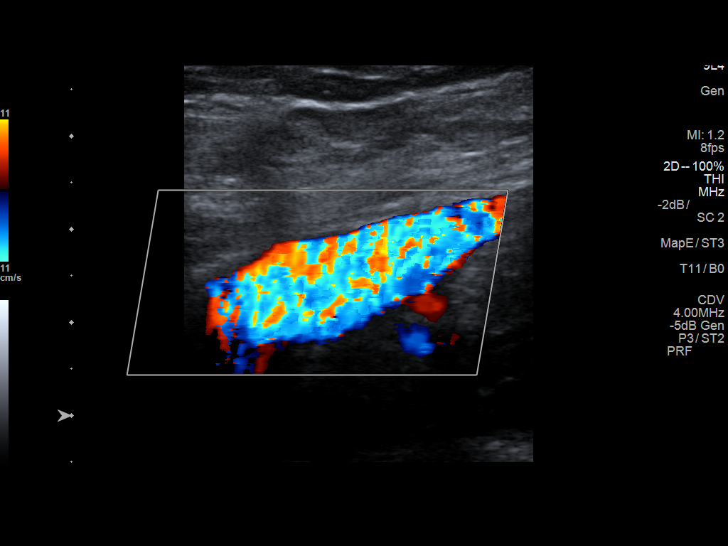
[im 6/33]
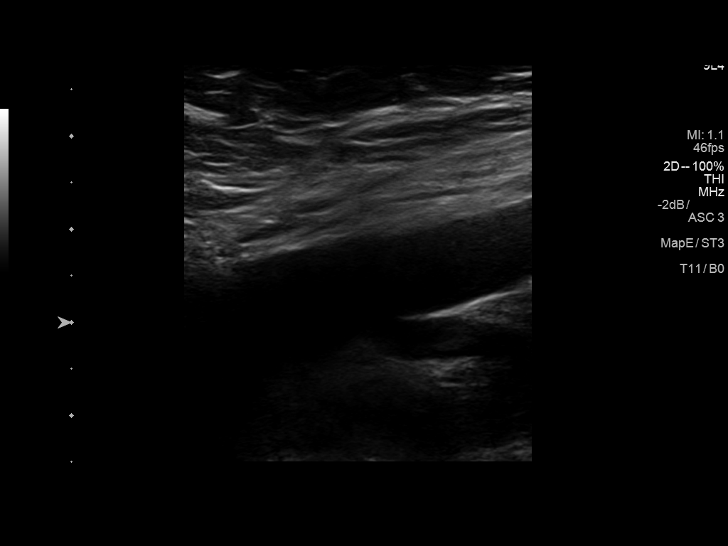
[im 9/33]
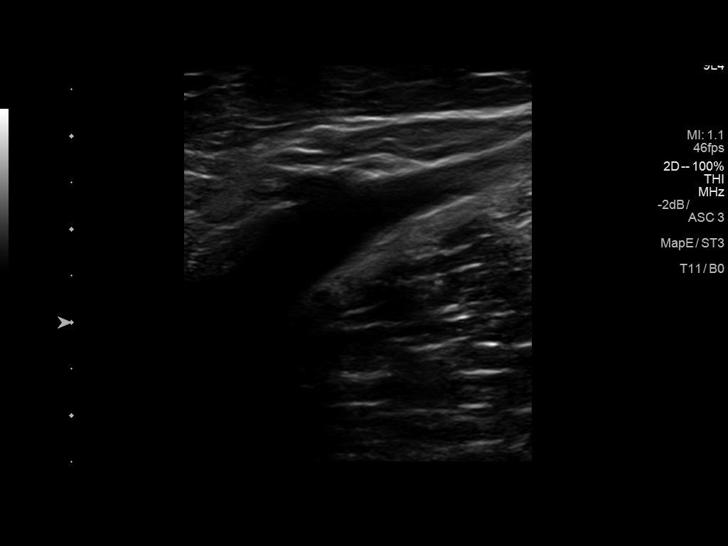
[im 12/33]
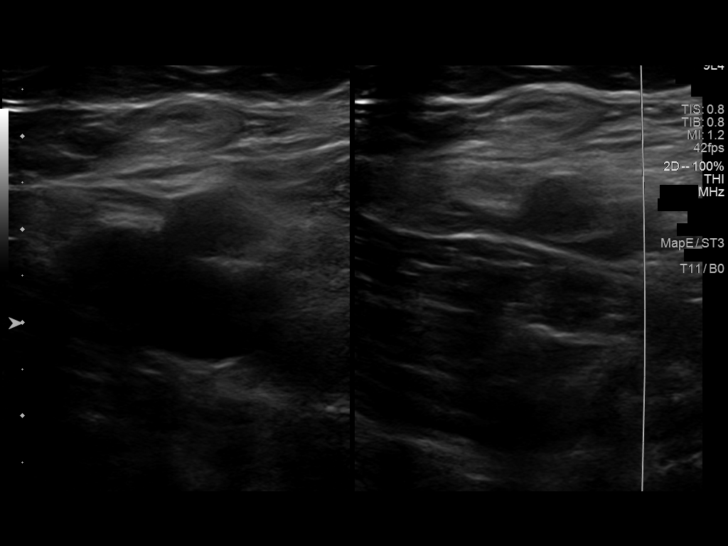
[im 14/33]
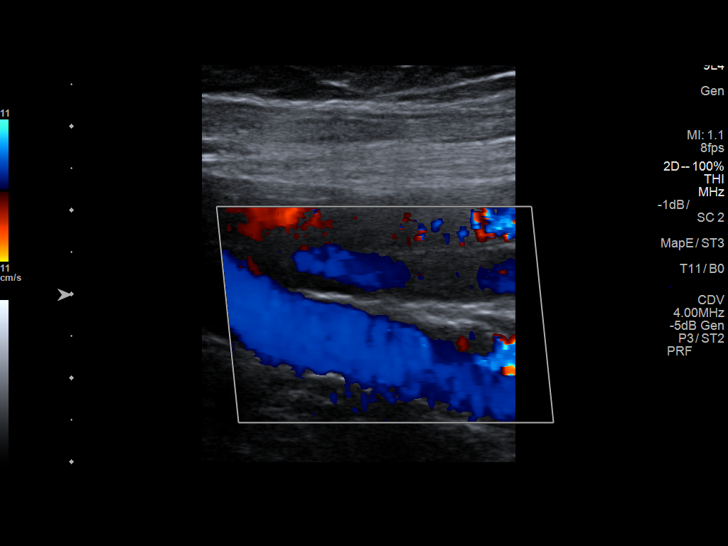
[im 17/33]
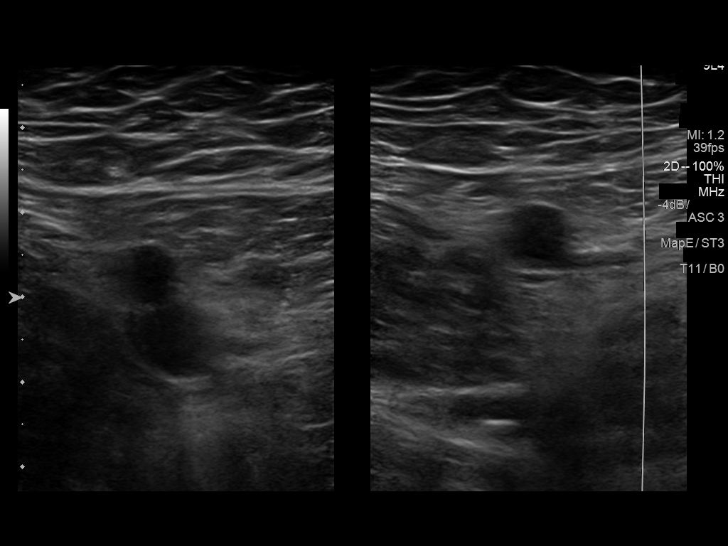
[im 19/33]
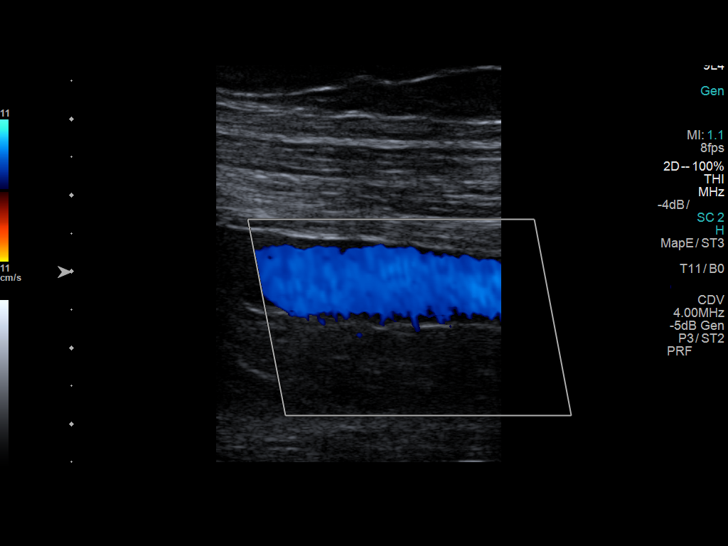
[im 21/33]
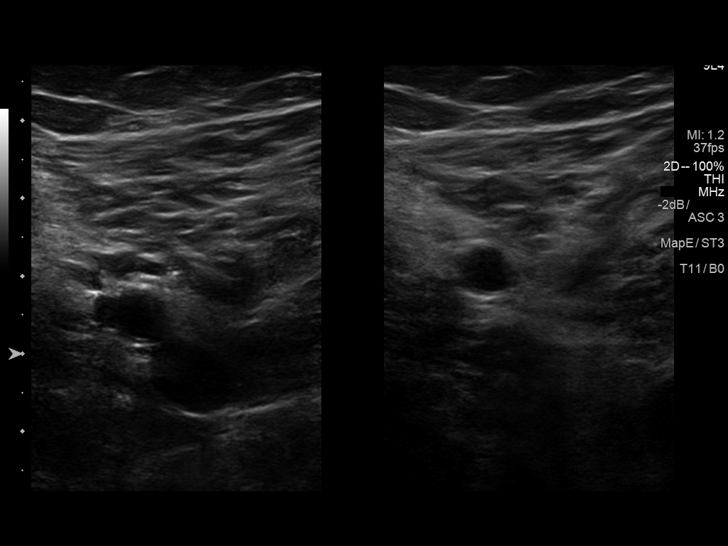
[im 24/33]
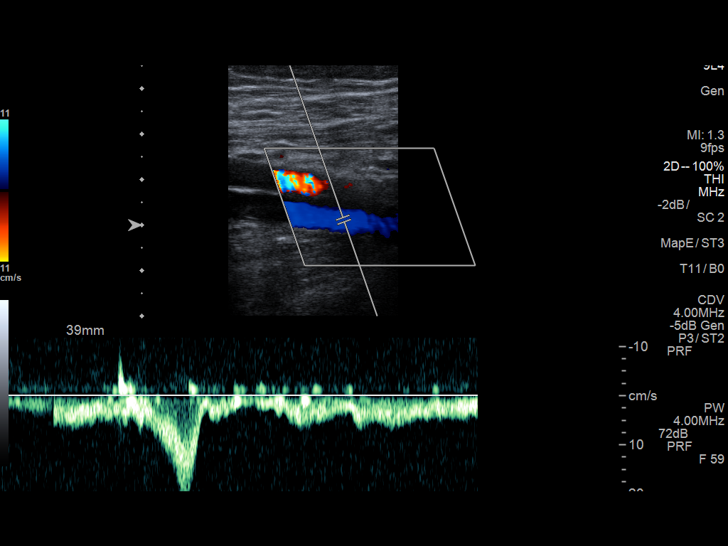
[im 27/33]
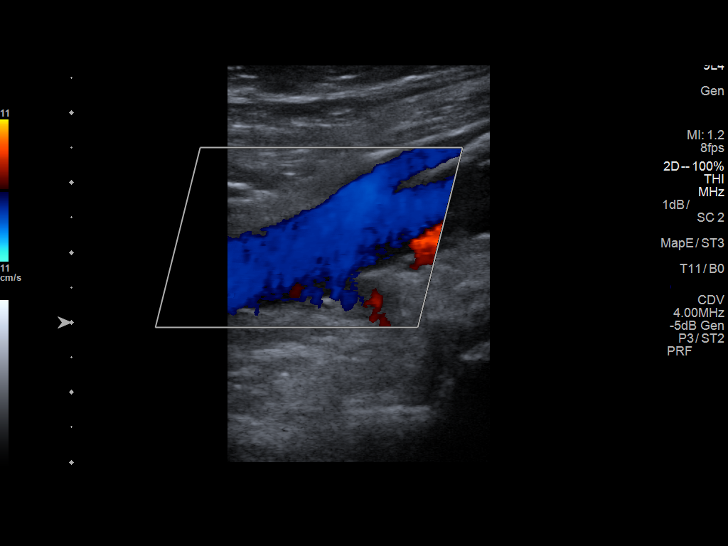
[im 30/33]
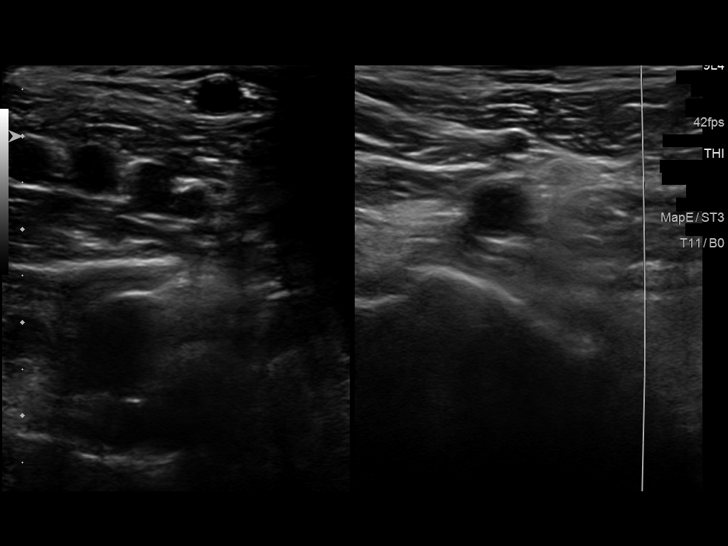
[im 33/33]
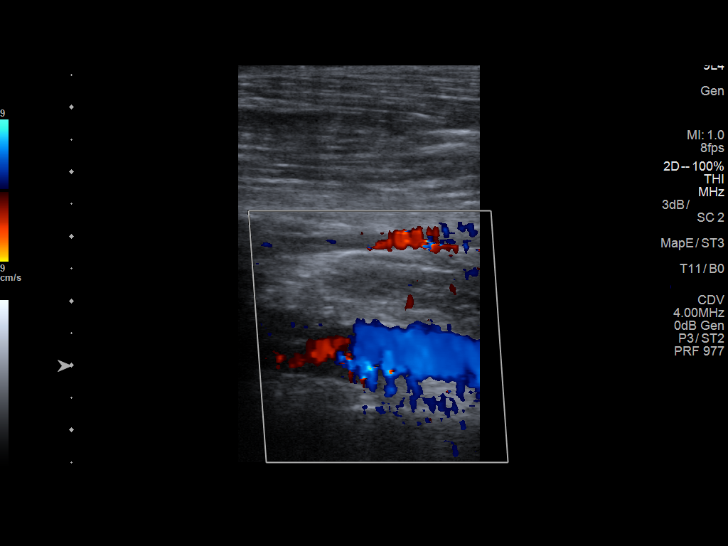

[13 of 24 positions shown; findings below may reference images not displayed]

FINDINGS: Contralateral Common Femoral Vein: Respiratory phasicity is normal
and symmetric with the symptomatic side. No evidence of thrombus.
Normal compressibility.

Common Femoral Vein: No evidence of thrombus. Normal
compressibility, respiratory phasicity and response to augmentation.

Saphenofemoral Junction: No evidence of thrombus. Normal
compressibility and flow on color Doppler imaging.

Profunda Femoral Vein: No evidence of thrombus. Normal
compressibility and flow on color Doppler imaging.

Femoral Vein: No evidence of thrombus. Normal compressibility,
respiratory phasicity and response to augmentation.

Popliteal Vein: No evidence of thrombus. Normal compressibility,
respiratory phasicity and response to augmentation.

Calf Veins: No evidence of thrombus. Normal compressibility and flow
on color Doppler imaging.

Superficial Great Saphenous Vein: No evidence of thrombus. Normal
compressibility and flow on color Doppler imaging.

Venous Reflux:  None.

Other Findings:  None.
IMPRESSION: No evidence of DVT within the left lower extremity.

## 2017-11-26 ENCOUNTER — Telehealth: Payer: Self-pay | Admitting: Orthopedic Surgery

## 2017-11-26 NOTE — Telephone Encounter (Signed)
Patient signed release for recent radiology reports,MRI reports. Aware  We will call when ready.

## 2017-11-27 NOTE — Telephone Encounter (Signed)
Patient aware ready for pickup 

## 2017-12-15 ENCOUNTER — Emergency Department (HOSPITAL_COMMUNITY)
Admission: EM | Admit: 2017-12-15 | Discharge: 2017-12-15 | Disposition: A | Discharge status: Home / Self Care | Payer: Medicaid Other | Attending: Emergency Medicine | Admitting: Emergency Medicine

## 2017-12-15 ENCOUNTER — Other Ambulatory Visit: Payer: Self-pay

## 2017-12-15 ENCOUNTER — Emergency Department (HOSPITAL_COMMUNITY): Payer: Medicaid Other

## 2017-12-15 ENCOUNTER — Encounter (HOSPITAL_COMMUNITY): Payer: Self-pay | Admitting: Emergency Medicine

## 2017-12-15 DIAGNOSIS — I10 Essential (primary) hypertension: Secondary | ICD-10-CM | POA: Diagnosis not present

## 2017-12-15 DIAGNOSIS — G8929 Other chronic pain: Secondary | ICD-10-CM | POA: Insufficient documentation

## 2017-12-15 DIAGNOSIS — Z87891 Personal history of nicotine dependence: Secondary | ICD-10-CM | POA: Diagnosis not present

## 2017-12-15 DIAGNOSIS — M25562 Pain in left knee: Secondary | ICD-10-CM | POA: Diagnosis present

## 2017-12-15 DIAGNOSIS — Z79899 Other long term (current) drug therapy: Secondary | ICD-10-CM | POA: Insufficient documentation

## 2017-12-15 MED ORDER — HYDROCODONE-ACETAMINOPHEN 5-325 MG PO TABS: ORAL_TABLET | ORAL | 0 refills | Status: DC

## 2017-12-15 NOTE — Discharge Instructions (Addendum)
Continue to wear your knee brace as needed.  You may apply ice packs on and off to help reduce swelling.  Follow-up with your orthopedic provider for recheck.

## 2017-12-15 NOTE — ED Triage Notes (Addendum)
Patient c/o left knee pain with swelling that started on Thursday. Per patient unable to bend knee. Patient reports having knee surgery 8 months ago and having to wear knee braces everyday.

## 2017-12-17 NOTE — ED Provider Notes (Signed)
[facility] EMERGENCY DEPARTMENT Provider Note   CSN: [id number] Arrival date & time: 12/15/17  1232     History   Chief Complaint Chief Complaint  Patient presents with  . Knee Pain    HPI Jamie [name] is a 47 y.o. male.  HPI   Jamie [name] is a 47 y.o. male who presents to the Emergency Department complaining of left knee pain and intermittent swelling.  Symptoms have been present for 2 days.  He states the pain is a recurring problem.  He has seen orthopedics in the past for same.  He currently wears a knee brace for support.  Pain has been worse than usual.  No known recent injury.  He states he has been told that he he will eventually need a knee replacement.  He denies redness of his knee, fever, chills, back pain or pain distal to the knee.  Past Medical History:  Diagnosis Date  . Anxiety   . Arthritis   . Chronic back pain   . Chronic knee pain   . GERD (gastroesophageal reflux disease)    occ  . HCV antibody positive   . Hypertension    "Dr Karie Kirks took me off meds"  diet control  . Lumbar radiculopathy   . Pre-diabetes     Patient Active Problem List   Diagnosis Date Noted  . S/P left knee arthroscopy 12/07/16 04/13/2017  . Family history of colon cancer 02/28/2017  . Hepatic cirrhosis ([place]) 02/28/2017  . Derangement of posterior horn of medial meniscus of left knee   . Chondromalacia patellae, left knee   . Chondromalacia of medial femoral condyle, left   . Hepatitis C 06/15/2016  . Lumbar herniated disc 10/06/2013  . Carpal tunnel syndrome 07/28/2013    Past Surgical History:  Procedure Laterality Date  . BACK SURGERY    . CARPAL TUNNEL RELEASE Right 06/25/2013   Procedure: CARPAL TUNNEL RELEASE;  Surgeon: Carole Civil, MD;  Location: [facility];  Service: Orthopedics;  Laterality: Right;  . CARPAL TUNNEL RELEASE Left 07/25/2013   Procedure: LEFT CARPAL TUNNEL RELEASE;  Surgeon: Carole Civil, MD;  Location: [facility];  Service: Orthopedics;   Laterality: Left;  . COLONOSCOPY WITH PROPOFOL N/A 04/05/2017   Procedure: COLONOSCOPY WITH PROPOFOL;  Surgeon: Daneil Dolin, MD;  [facility];  Service: Endoscopy;  Laterality: N/A;  12:30pm-pt notified to arrive at 9:15am for 10:45am procedure per KF  . ELBOW SURGERY Left   . FOOT SURGERY    . HERNIA REPAIR Right    inguinal- age 17  . KNEE ARTHROSCOPY WITH MEDIAL MENISECTOMY Left 12/07/2016   Procedure: KNEE ARTHROSCOPY WITH MEDIAL MENISECTOMY;  Surgeon: Carole Civil, MD;  Location: [facility];  Service: Orthopedics;  Laterality: Left;  . KNEE SURGERY    . left elbow    . LUMBAR LAMINECTOMY/DECOMPRESSION MICRODISCECTOMY Left 10/06/2013   Procedure: Left Lumbar Three-four microdiskectomy;  Surgeon: Ophelia Charter, MD;  Location: [place] [facility];  Service: Neurosurgery;  Laterality: Left;  Left Lumbar Three-four microdiskectomy  . right foot     forgein body removal  . right knee  orif right patella Keeling 1993  . SEPTOPLASTY       Home Medications    Prior to Admission medications   Medication Sig Start Date End Date Taking? Authorizing Provider  acetaminophen (TYLENOL) 325 MG tablet Take 650 mg by mouth every 6 (six) hours as needed for mild pain or headache.    [provider]  acetaminophen-codeine (TYLENOL #3) 300-30 MG tablet Take 1 tablet by mouth every 4 (four) hours as needed for moderate pain. 10/17/17   Carole Civil, MD  ALPRAZolam Duanne Moron) 1 MG tablet TAKE 1 TABLET BY MOUTH EVERY NIGJHT AT BEDTIME 10/19/16   [provider]  clonazePAM (KLONOPIN) 1 MG tablet Take 1 mg by mouth 3 (three) times daily.     [provider]  diazepam (VALIUM) 5 MG tablet TK 1 T PO NOT MORE THAN TWICE DAILY FOR ANXIETY OR SLEEP 10/02/17   [provider]  diclofenac (CATAFLAM) 50 MG tablet TAKE 1 TABLET BY MOUTH TWICE DAILY 05/18/17   Carole Civil, MD  dronabinol (MARINOL) 5 MG capsule TK 1 C PO BID 10/02/17   [provider]    HYDROcodone-acetaminophen (NORCO/VICODIN) 5-325 MG tablet Take one tab po q 4 hrs prn pain 12/15/17   Kem Parkinson, PA-C    Family History Family History  Problem Relation Age of Onset  . Heart disease Unknown   . Arthritis Unknown   . Cancer Unknown   . Asthma Unknown   . Diabetes Unknown   . Kidney disease Unknown   . Colon cancer Father 62    Social History Social History   Tobacco Use  . Smoking status: Former Smoker    Packs/day: 1.00    Years: 15.00    Pack years: 15.00    Types: Cigarettes    Last attempt to quit: 12/21/2012    Years since quitting: 4.9  . Smokeless tobacco: Former Systems developer    Types: [place] date: 12/05/1990  Substance Use Topics  . Alcohol use: No  . Drug use: Yes    Frequency: 2.0 times per week    Types: Marijuana    Comment: for pain/anxiety; occ 02/28/17     Allergies   Patient has no known allergies.   Review of Systems Review of Systems  Constitutional: Negative for chills and fever.  Musculoskeletal: Positive for arthralgias (Left knee pain). Negative for back pain and joint swelling.  Skin: Negative for color change and wound.  Neurological: Negative for weakness and numbness.     Physical Exam Updated Vital Signs BP (!) 159/105 (BP Location: Right Arm)   Pulse (!) 59   Temp 98 F (36.7 C) (Oral)   Resp 16   Ht 5\' 11"  (1.803 m)   Wt 116.1 kg   SpO2 99%   BMI 35.70 kg/m   Physical Exam  Constitutional: He is oriented to person, place, and time. He appears well-developed. No distress.  Cardiovascular: Normal rate, regular rhythm and intact distal pulses.  Pulmonary/Chest: Effort normal and breath sounds normal.  Musculoskeletal: He exhibits tenderness. He exhibits no edema or deformity.  ttp of the anterior left knee.  Pain on valgus and varus stress.  Negative drawer sign.  No erythema, effusion, or step-off deformity.    Neurological: He is alert and oriented to person, place, and time. He exhibits normal muscle  tone. Coordination normal.  Skin: Skin is warm. Capillary refill takes less than 2 seconds. No rash noted. No erythema.  Nursing note and vitals reviewed.    ED Treatments / Results  Labs (all labs ordered are listed, but only abnormal results are displayed) Labs Reviewed - No data to display  EKG None  Radiology No results found.  Procedures Procedures (including critical care time)  Medications Ordered in ED Medications - No data to display   Initial Impression /  Assessment and Plan / ED Course  I have reviewed the triage vital signs and the nursing notes.  Pertinent labs & imaging results that were available during my care of the patient were reviewed by me and considered in my medical decision making (see chart for details).     Patient with recurrent left knee pain.  Likely acute on chronic flare.  No concerning symptoms for septic joint.  Neurovascularly intact.  Patient agrees to close orthopedic follow-up, appears appropriate for discharge home  Final Clinical Impressions(s) / ED Diagnoses   Final diagnoses:  Chronic pain of left knee    ED Discharge Orders         Ordered    HYDROcodone-acetaminophen (NORCO/VICODIN) 5-325 MG tablet     12/15/17 [place]on, Vanassa Penniman, PA-C 12/17/17 1859    Francine Graven, DO 12/18/17 1006

## 2017-12-28 ENCOUNTER — Encounter: Payer: Self-pay | Admitting: Orthopedic Surgery

## 2017-12-28 ENCOUNTER — Ambulatory Visit: Payer: Medicaid Other | Admitting: Orthopedic Surgery

## 2017-12-28 VITALS — BP 132/83 | HR 54 | Ht 71.0 in | Wt 242.0 lb

## 2017-12-28 DIAGNOSIS — M25562 Pain in left knee: Secondary | ICD-10-CM

## 2017-12-28 DIAGNOSIS — Z9889 Other specified postprocedural states: Secondary | ICD-10-CM

## 2017-12-28 DIAGNOSIS — M25462 Effusion, left knee: Secondary | ICD-10-CM

## 2017-12-28 DIAGNOSIS — M1712 Unilateral primary osteoarthritis, left knee: Secondary | ICD-10-CM | POA: Diagnosis not present

## 2017-12-28 DIAGNOSIS — G8929 Other chronic pain: Secondary | ICD-10-CM

## 2017-12-28 NOTE — Progress Notes (Signed)
Chief Complaint  Patient presents with  . Knee Pain    left knee stinging, burning and giving way    47 year old male history of left knee effusion status post arthroscopy left knee currently on diclofenac and Tylenol  He was noted to have increased pain and swelling decreased range of motion left knee with dull aching pain requiring him to go to the ER when the pain became more severe.  The joint was swollen it seems to have gone down some.  No fever no chills he does complain of some burning pain in the left knee but no sensory changes  Past Medical History:  Diagnosis Date  . Anxiety   . Arthritis   . Chronic back pain   . Chronic knee pain   . GERD (gastroesophageal reflux disease)    occ  . HCV antibody positive   . Hypertension    "Dr Karie Kirks took me off meds"  diet control  . Lumbar radiculopathy   . Pre-diabetes    BP 132/83   Pulse (!) 54   Ht 5\' 11"  (1.803 m)   Wt 242 lb (109.8 kg)   BMI 33.75 kg/m   Patient is awake alert and oriented x3 mood and affect is normal gait is supported by a cane  His left knee is swollen tender to palpation diffusely about the joint flexion is limited to 110 degrees full extension knee is stable strength is normal skin is intact with no erythema pulses are good sensation is normal  Encounter Diagnoses  Name Primary?  . Effusion of knee joint, left Yes  . S/P left knee arthroscopy 12/07/16   . Chronic pain of left knee   . Primary osteoarthritis of left knee     Procedure note injection and aspiration left knee joint  Verbal consent was obtained to aspirate and inject the left knee joint   Timeout was completed to confirm the site of aspiration and injection  An 18-gauge needle was used to aspirate the left knee joint from a suprapatellar lateral approach.  The medications used were 40 mg of Depo-Medrol and 1% lidocaine 3 cc  Anesthesia was provided by ethyl chloride and the skin was prepped with alcohol.  After cleaning  the skin with alcohol an 18-gauge needle was used to aspirate the right knee joint.  We obtained 55 cc of fluid, clear  We followed this by injection of 40 mg of Depo-Medrol and 3 cc 1% lidocaine.  There were no complications. A sterile bandage was applied.   Continue diclofenac Ice 4 times a day 7 days of rest Follow-up if no improvement

## 2017-12-28 NOTE — Patient Instructions (Signed)
Ice  30 minutes 4 times a day  Rest 7 days   Continue diclofenac twice a day

## 2018-03-04 ENCOUNTER — Telehealth: Payer: Self-pay | Admitting: Orthopedic Surgery

## 2018-03-04 NOTE — Telephone Encounter (Signed)
Called back to patient. Discussed. Aware.

## 2018-03-04 NOTE — Telephone Encounter (Signed)
Call from patient, states going for "walk-in" appointment at [facility], tomorrow, 03/05/18. Asking if we can send records there. I relayed if a referral, we would have sent records. Please advise.   Patient had come by office on 11/27/17 and picked up records of last office visit and reports recent Xrays and MRI. States he needed them for his own records. Was aware to request films from [facility]. Would that be sufficient for him to carry to [facility].

## 2018-03-04 NOTE — Telephone Encounter (Signed)
Yes,

## 2018-05-09 ENCOUNTER — Encounter: Payer: Self-pay | Admitting: Orthopedic Surgery

## 2018-05-09 ENCOUNTER — Ambulatory Visit: Payer: Medicaid Other | Admitting: Orthopedic Surgery

## 2018-05-09 VITALS — BP 123/82 | HR 69 | Ht 71.0 in | Wt 252.0 lb

## 2018-05-09 DIAGNOSIS — Z9889 Other specified postprocedural states: Secondary | ICD-10-CM

## 2018-05-09 DIAGNOSIS — M25462 Effusion, left knee: Secondary | ICD-10-CM

## 2018-05-09 NOTE — Patient Instructions (Signed)
Knee Injection A knee injection is a procedure to get medicine into your knee joint to relieve the pain, swelling, and stiffness of arthritis. Your health care provider uses a needle to inject medicine, which may also help to lubricate and cushion your knee joint. You may need more than one injection. Tell a health care provider about:  Any allergies you have.  All medicines you are taking, including vitamins, herbs, eye drops, creams, and over-the-counter medicines.  Any problems you or family members have had with anesthetic medicines.  Any blood disorders you have.  Any surgeries you have had.  Any medical conditions you have.  Whether you are pregnant or may be pregnant. What are the risks? Generally, this is a safe procedure. However, problems may occur, including:  Infection.  Bleeding.  Symptoms that get worse.  Damage to the area around your knee.  Allergic reaction to any of the medicines.  Skin reactions from repeated injections. What happens before the procedure?  Ask your health care provider about changing or stopping your regular medicines. This is especially important if you are taking diabetes medicines or blood thinners.  Plan to have someone take you home from the hospital or clinic. What happens during the procedure?   You will sit or lie down in a position for your knee to be treated.  The skin over your kneecap will be cleaned with a germ-killing soap.  You will be given a medicine that numbs the area (local anesthetic). You may feel some stinging.  The medicine will be injected into your knee. The needle is carefully placed between your kneecap and your knee. The medicine is injected into the joint space.  The needle will be removed at the end of the procedure.  A bandage (dressing) may be placed over the injection site. The procedure may vary among health care providers and hospitals. What can I expect after the procedure?  Your blood  pressure, heart rate, breathing rate, and blood oxygen level will be monitored until you leave the hospital or clinic.  You may have to move your knee through its full range of motion. This helps to get all the medicine into your joint space.  You will be watched to make sure that you do not have a reaction to the injected medicine.  You may feel more pain, swelling, and warmth than you did before the injection. This reaction may last about 1-2 days. Follow these instructions at home: Medicines  Take over-the-counter and prescription medicines only as told by your doctor.  Do not drive or use heavy machinery while taking prescription pain medicine.  Do not take medicines such as aspirin and ibuprofen unless your health care provider tells you to take them. Injection site care  Follow instructions from your health care provider about: ? How to take care of your puncture site. ? When and how you should change your dressing. ? When you should remove your dressing.  Check your injection area every day for signs of infection. Check for: ? More redness, swelling, or pain after 2 days. ? Fluid or blood. ? Pus or a bad smell. ? Warmth. Managing pain, stiffness, and swelling   If directed, put ice on the injection area: ? Put ice in a plastic bag. ? Place a towel between your skin and the bag. ? Leave the ice on for 20 minutes, 2-3 times per day.  Do not apply heat to your knee.  Raise (elevate) the injection area above the level   apply heat to your knee.  Raise (elevate) the injection area above the level of your heart while you are sitting or lying down.  General instructions  If you were given a dressing, keep it dry until your health care provider says it can be removed. Ask your health care provider when you can start showering or taking a bath.  Avoid strenuous activities for as long as directed by your health care provider. Ask your health care provider when you can return to your normal activities.  Keep all follow-up visits as told by your health care provider. This is important. You may need more injections.  Contact a health care provider if  you have:  A fever.  Warmth in your injection area.  Fluid, blood, or pus coming from your injection site.  Symptoms at your injection site that last longer than 2 days after your procedure.  Get help right away if:  Your knee:  Turns very red.  Becomes very swollen.  Is in severe pain.  Summary  A knee injection is a procedure to get medicine into your knee joint to relieve the pain, swelling, and stiffness of arthritis.  A needle is carefully placed between your kneecap and your knee to inject medicine into the joint space.  Before the procedure, ask your health care provider about changing or stopping your regular medicines, especially if you are taking diabetes medicines or blood thinners.  Contact your health care provider if you have any problems or questions after your procedure.  This information is not intended to replace advice given to you by your health care provider. Make sure you discuss any questions you have with your health care provider.  Document Released: 06/18/2006 Document Revised: 04/16/2017 Document Reviewed: 04/16/2017  Elsevier Interactive Patient Education  2019 [employer] Inc[employer]

## 2018-05-09 NOTE — Progress Notes (Signed)
OFFICE VISIT  Chief Complaint  Patient presents with  . Knee Pain    left knee has been painful, but better today     [name] is status post arthroscopy of his knee several months ago.  He has recurrent knee effusions.  He is 48 and too young for knee replacement.  He comes in today complaining of acute swelling of the left knee 3 days ago with dull pain and trouble bending his knee the pain was moderate seem to be brought on by activity however today it is come down significantly   Review of Systems  Constitutional: Negative for chills and fever.  Skin: Negative.   Neurological:       Muscle spasms back spasms     Past Medical History:  Diagnosis Date  . Anxiety   . Arthritis   . Chronic back pain   . Chronic knee pain   . GERD (gastroesophageal reflux disease)    occ  . HCV antibody positive   . Hypertension    "Dr Karie Kirks took me off meds"  diet control  . Lumbar radiculopathy   . Pre-diabetes     Past Surgical History:  Procedure Laterality Date  . BACK SURGERY    . CARPAL TUNNEL RELEASE Right 06/25/2013   Procedure: CARPAL TUNNEL RELEASE;  Surgeon: Carole Civil, MD;  Location: [facility];  Service: Orthopedics;  Laterality: Right;  . CARPAL TUNNEL RELEASE Left 07/25/2013   Procedure: LEFT CARPAL TUNNEL RELEASE;  Surgeon: Carole Civil, MD;  Location: [facility];  Service: Orthopedics;  Laterality: Left;  . COLONOSCOPY WITH PROPOFOL N/A 04/05/2017   Procedure: COLONOSCOPY WITH PROPOFOL;  Surgeon: Daneil Dolin, MD;  Location: [facility];  Service: Endoscopy;  Laterality: N/A;  12:30pm-pt notified to arrive at 9:15am for 10:45am procedure per KF  . ELBOW SURGERY Left   . FOOT SURGERY    . HERNIA REPAIR Right    inguinal- age 48  . KNEE ARTHROSCOPY WITH MEDIAL MENISECTOMY Left 12/07/2016   Procedure: KNEE ARTHROSCOPY WITH MEDIAL MENISECTOMY;  Surgeon: Carole Civil, MD;  Location: [facility];  Service: Orthopedics;  Laterality: Left;  . KNEE SURGERY     . left elbow    . LUMBAR LAMINECTOMY/DECOMPRESSION MICRODISCECTOMY Left 10/06/2013   Procedure: Left Lumbar Three-four microdiskectomy;  Surgeon: Ophelia Charter, MD;  Location: [facility];  Service: Neurosurgery;  Laterality: Left;  Left Lumbar Three-four microdiskectomy  . right foot     forgein body removal  . right knee  orif right patella Keeling 1993  . SEPTOPLASTY      Family History  Problem Relation Age of Onset  . Heart disease Unknown   . Arthritis Unknown   . Cancer Unknown   . Asthma Unknown   . Diabetes Unknown   . Kidney disease Unknown   . Colon cancer Father 26   Social History   Tobacco Use  . Smoking status: Former Smoker    Packs/day: 1.00    Years: 15.00    Pack years: 15.00    Types: Cigarettes    Last attempt to quit: 12/21/2012    Years since quitting: 5.3  . Smokeless tobacco: Former Systems developer    Types: [place] date: 12/05/1990  Substance Use Topics  . Alcohol use: No  . Drug use: Yes    Frequency: 2.0 times per week    Types: Marijuana    Comment: for pain/anxiety; occ 02/28/17    No Known  Allergies  Current Meds  Medication Sig  . clonazePAM (KLONOPIN) 1 MG tablet Take 1 mg by mouth 3 (three) times daily.   . diazepam (VALIUM) 5 MG tablet TK 1 T PO NOT MORE THAN TWICE DAILY FOR ANXIETY OR SLEEP  . diclofenac (CATAFLAM) 50 MG tablet TAKE 1 TABLET BY MOUTH TWICE DAILY  . dronabinol (MARINOL) 5 MG capsule TK 1 C PO BID  . HYDROcodone-acetaminophen (NORCO/VICODIN) 5-325 MG tablet Take one tab po q 4 hrs prn pain    BP 123/82   Pulse 69   Ht 5\' 11"  (1.803 m)   Wt 252 lb (114.3 kg)   BMI 35.15 kg/m   Physical Exam Appearance normal Oriented x3 Mood pleasant affect normal Gait normal Ortho Exam Left knee is swollen with large effusion it is tender in the lateral patellofemoral compartment he has 95 degrees of flexion his knee gets tight his knee is stable strength is normal skin is intact there is no rash pulses good lymph nodes  are negative in the left groin and sensation is normal    MEDICAL DECISION SECTION  Xrays were done at No x-rays  Encounter Diagnoses  Name Primary?  . Effusion of knee joint, left Yes  . S/P left knee arthroscopy 12/07/16     PLAN: (Rx., injectx, surgery, frx, mri/ct) Procedure note injection and aspiration left knee joint  Verbal consent was obtained to aspirate and inject the left knee joint   Timeout was completed to confirm the site of aspiration and injection  An 18-gauge needle was used to aspirate the left knee joint from a suprapatellar lateral approach.  The medications used were 40 mg of Depo-Medrol and 1% lidocaine 3 cc  Anesthesia was provided by ethyl chloride and the skin was prepped with alcohol.  After cleaning the skin with alcohol an 18-gauge needle was used to aspirate the right knee joint.  We obtained 50 cc of fluid  We followed this by injection of 40 mg of Depo-Medrol and 3 cc 1% lidocaine.  There were no complications. A sterile bandage was applied.   No orders of the defined types were placed in this encounter.   Arther Abbott, MD  05/09/2018 2:14 PM

## 2018-06-03 ENCOUNTER — Ambulatory Visit: Payer: Medicaid Other | Admitting: Orthopedic Surgery

## 2018-06-03 ENCOUNTER — Encounter: Payer: Self-pay | Admitting: Orthopedic Surgery

## 2018-06-03 VITALS — BP 125/88 | HR 73 | Ht 71.0 in | Wt 250.0 lb

## 2018-06-03 DIAGNOSIS — M25462 Effusion, left knee: Secondary | ICD-10-CM

## 2018-06-03 NOTE — Progress Notes (Signed)
Progress Note   Patient ID: Jamie [name], male   DOB: 02-11-1971, 48 y.o.   MRN: [id number]   Chief Complaint  Patient presents with  . Knee Pain    left     Jamie [name] is status post arthroscopy of his knee several months ago.  He has recurrent knee effusions.  He is 50 and too young for knee replacement.  He comes in today complaining of acute swelling of the left knee 3 days ago with dull pain and trouble bending his knee the pain was moderate seem to be brought on by activity however today it is come down significantly    Review of Systems  Constitutional:       Urinary retention currently on Cipro antibiotic     No Known Allergies   BP 125/88   Pulse 73   Ht 5\' 11"  (1.803 m)   Wt 250 lb (113.4 kg)   BMI 34.87 kg/m   Physical Exam Constitutional:      General: He is not in acute distress.    Appearance: Normal appearance.  Skin:    General: Skin is warm.     Capillary Refill: Capillary refill takes less than 2 seconds.  Neurological:     General: No focal deficit present.     Mental Status: He is alert and oriented to person, place, and time.  Psychiatric:        Mood and Affect: Mood normal.        Behavior: Behavior normal.        Thought Content: Thought content normal.        Judgment: Judgment normal.    Left knee is swollen there is no warmth or heat when touching no erythema.  Large joint effusion.  Medial peripatellar tenderness to palpation limb alignment remains normal muscle tone is excellent ligaments are stable  Medical decisions:   Data  Imaging:   N/A  Encounter Diagnosis  Name Primary?  . Effusion of knee joint, left Yes    PLAN:   Continue 50 mg diclofenac twice a day Use ice as needed Come back as needed  Aspiration injection left knee joint  Procedure note injection and aspiration left knee joint  Verbal consent was obtained to aspirate and inject the left knee joint   Timeout was completed to confirm the site of  aspiration and injection  An 18-gauge needle was used to aspirate the left knee joint from a suprapatellar lateral approach.  The medications used were 40 mg of Depo-Medrol and 1% lidocaine 3 cc  Anesthesia was provided by ethyl chloride and the skin was prepped with alcohol.  After cleaning the skin with alcohol an 18-gauge needle was used to aspirate the right knee joint.  We obtained 60 cc of fluid, first 30 cc clear second 30 cc of blood-tinged  We followed this by injection of 40 mg of Depo-Medrol and 3 cc 1% lidocaine.  There were no complications. A sterile bandage was applied.      Arther Abbott, MD 06/03/2018 9:48 AM

## 2018-06-03 NOTE — Patient Instructions (Signed)
Use ice as needed Diclofenac daily

## 2018-06-18 ENCOUNTER — Other Ambulatory Visit: Payer: Self-pay

## 2018-06-18 ENCOUNTER — Encounter (HOSPITAL_COMMUNITY): Payer: Self-pay | Admitting: *Deleted

## 2018-06-18 ENCOUNTER — Emergency Department (HOSPITAL_COMMUNITY): Payer: Medicaid Other

## 2018-06-18 ENCOUNTER — Emergency Department (HOSPITAL_COMMUNITY)
Admission: EM | Admit: 2018-06-18 | Discharge: 2018-06-18 | Disposition: A | Discharge status: Home / Self Care | Payer: Medicaid Other | Attending: Emergency Medicine | Admitting: Emergency Medicine

## 2018-06-18 DIAGNOSIS — Y999 Unspecified external cause status: Secondary | ICD-10-CM | POA: Diagnosis not present

## 2018-06-18 DIAGNOSIS — M545 Low back pain, unspecified: Secondary | ICD-10-CM

## 2018-06-18 DIAGNOSIS — Z87891 Personal history of nicotine dependence: Secondary | ICD-10-CM | POA: Insufficient documentation

## 2018-06-18 DIAGNOSIS — T148XXA Other injury of unspecified body region, initial encounter: Secondary | ICD-10-CM | POA: Insufficient documentation

## 2018-06-18 DIAGNOSIS — Y939 Activity, unspecified: Secondary | ICD-10-CM | POA: Diagnosis not present

## 2018-06-18 DIAGNOSIS — M25562 Pain in left knee: Secondary | ICD-10-CM | POA: Diagnosis not present

## 2018-06-18 DIAGNOSIS — Z79899 Other long term (current) drug therapy: Secondary | ICD-10-CM | POA: Diagnosis not present

## 2018-06-18 DIAGNOSIS — W1809XA Striking against other object with subsequent fall, initial encounter: Secondary | ICD-10-CM | POA: Insufficient documentation

## 2018-06-18 DIAGNOSIS — M79605 Pain in left leg: Secondary | ICD-10-CM | POA: Diagnosis present

## 2018-06-18 DIAGNOSIS — Y92810 Car as the place of occurrence of the external cause: Secondary | ICD-10-CM | POA: Diagnosis not present

## 2018-06-18 DIAGNOSIS — I10 Essential (primary) hypertension: Secondary | ICD-10-CM | POA: Insufficient documentation
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
## 2018-06-18 NOTE — Discharge Instructions (Addendum)
Apply ice packs on and off to your back and knee.  You may wear the knee sleeve as needed for weightbearing, but do not wear continuously or at bedtime.  Call Dr. Ruthe Mannan office to arrange a follow-up appointment
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
## 2018-06-21 ENCOUNTER — Encounter: Payer: Self-pay | Admitting: Orthopedic Surgery

## 2018-06-21 ENCOUNTER — Ambulatory Visit: Payer: Medicaid Other | Admitting: Orthopedic Surgery

## 2018-06-21 ENCOUNTER — Other Ambulatory Visit: Payer: Self-pay

## 2018-06-21 DIAGNOSIS — M25462 Effusion, left knee: Secondary | ICD-10-CM
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
## 2018-07-25 ENCOUNTER — Telehealth: Payer: Self-pay | Admitting: Orthopedic Surgery

## 2018-07-25 NOTE — Telephone Encounter (Signed)
Patient called stating he twisted his ankle about 3 weeks ago and it doesn't seem to be getting any better. Wanted to know how does it work about going to the ER to get an xray. I suggested to him to contact his PCP's office to see what he may recommend. Stated his PCP's office is closed today, but he will give them a call in the morning. I told him that Dr. Aline Brochure would not be in the office until next week, he stated he understood.

## 2018-07-30 ENCOUNTER — Emergency Department (HOSPITAL_COMMUNITY): Payer: Medicaid Other

## 2018-07-30 ENCOUNTER — Encounter (HOSPITAL_COMMUNITY): Payer: Self-pay | Admitting: Emergency Medicine

## 2018-07-30 ENCOUNTER — Telehealth: Payer: Self-pay | Admitting: Orthopedic Surgery

## 2018-07-30 ENCOUNTER — Emergency Department (HOSPITAL_COMMUNITY)
Admission: EM | Admit: 2018-07-30 | Discharge: 2018-07-30 | Disposition: A | Discharge status: Home / Self Care | Payer: Medicaid Other | Attending: Emergency Medicine | Admitting: Emergency Medicine

## 2018-07-30 ENCOUNTER — Other Ambulatory Visit: Payer: Self-pay

## 2018-07-30 DIAGNOSIS — S93401A Sprain of unspecified ligament of right ankle, initial encounter: Secondary | ICD-10-CM | POA: Diagnosis not present

## 2018-07-30 DIAGNOSIS — Y9389 Activity, other specified: Secondary | ICD-10-CM | POA: Insufficient documentation

## 2018-07-30 DIAGNOSIS — Y9289 Other specified places as the place of occurrence of the external cause: Secondary | ICD-10-CM | POA: Insufficient documentation

## 2018-07-30 DIAGNOSIS — Y998 Other external cause status: Secondary | ICD-10-CM | POA: Insufficient documentation

## 2018-07-30 DIAGNOSIS — M1712 Unilateral primary osteoarthritis, left knee: Secondary | ICD-10-CM | POA: Diagnosis not present

## 2018-07-30 DIAGNOSIS — S99911A Unspecified injury of right ankle, initial encounter: Secondary | ICD-10-CM | POA: Diagnosis present

## 2018-07-30 DIAGNOSIS — X500XXA Overexertion from strenuous movement or load, initial encounter: Secondary | ICD-10-CM | POA: Insufficient documentation

## 2018-07-30 MED ORDER — NAPROXEN 250 MG PO TABS
500.0000 mg | ORAL_TABLET | Freq: Once | ORAL | Status: AC
  Administered 2018-07-30: 500 mg via ORAL
  Filled 2018-07-30: qty 2

## 2018-07-30 NOTE — Telephone Encounter (Signed)
Patient called wanting to be seen for a twisted ankle. Stated that he twisted it about 3 weeks ago, it still has some swelling and hurts to put weight on it. Stated he didn't go to ER because of the virus. Also, states that his L Knee needs fluid "drawed off, it seems the fluid is going all the way up into thigh and can't bend leg". Patient states that it has  never been this bad before.  Please advise when you would like to see him. He called last week and I suggested he see his PCP for his ankle since you were not in the office, he stated he would.

## 2018-07-30 NOTE — ED Triage Notes (Signed)
Pt c/o of RT ankle pain after a fall three weeks ago. Pt also reports LT knee pain and swelling that he gets fluid drawn off by Dr. Aline Brochure weekly. States he was unable to get an appointment yesterday.

## 2018-07-30 NOTE — ED Provider Notes (Signed)
[facility] Provider Note   CSN: [id number] Arrival date & time: 07/30/18  1501    History   Chief Complaint Chief Complaint  Patient presents with  . Ankle Pain  . Knee Pain    HPI Jamie [name] is a 48 y.o. male.     HPI   48 year old male comes in a chief complaint of ankle pain and knee pain.  He reports that about 3 weeks ago he had a fall and he twisted his ankle.  Since then he has been having persistent ankle discomfort with ambulation.  Patient has been using crutches to walk.  He also complains of left knee swelling.  He sees Dr. Aline Brochure for his osteoarthritis and is awaiting left-sided knee replacement.  In the interim his knee has been drained by Dr. Aline Brochure regularly.  He states that the knee swelling this time post aspiration quickly accumulated the fluid.  He is having difficulty bending his left knee.  He has an appointment coming up with Dr. Aline Brochure soon.  Past Medical History:  Diagnosis Date  . Anxiety   . Arthritis   . Chronic back pain   . Chronic knee pain   . GERD (gastroesophageal reflux disease)    occ  . HCV antibody positive   . Hypertension    "Dr Karie Kirks took me off meds"  diet control  . Lumbar radiculopathy   . Pre-diabetes     Patient Active Problem List   Diagnosis Date Noted  . Effusion of knee joint, left 06/03/2018  . S/P left knee arthroscopy 12/07/16 04/13/2017  . Family history of colon cancer 02/28/2017  . Hepatic cirrhosis ([place]) 02/28/2017  . Derangement of posterior horn of medial meniscus of left knee   . Chondromalacia patellae, left knee   . Chondromalacia of medial femoral condyle, left   . Hepatitis C 06/15/2016  . Lumbar herniated disc 10/06/2013  . Carpal tunnel syndrome 07/28/2013    Past Surgical History:  Procedure Laterality Date  . BACK SURGERY    . CARPAL TUNNEL RELEASE Right 06/25/2013   Procedure: CARPAL TUNNEL RELEASE;  Surgeon: Carole Civil, MD;  Location: [facility];   Service: Orthopedics;  Laterality: Right;  . CARPAL TUNNEL RELEASE Left 07/25/2013   Procedure: LEFT CARPAL TUNNEL RELEASE;  Surgeon: Carole Civil, MD;  Location: [facility];  Service: Orthopedics;  Laterality: Left;  . COLONOSCOPY WITH PROPOFOL N/A 04/05/2017   Procedure: COLONOSCOPY WITH PROPOFOL;  Surgeon: Daneil Dolin, MD;  Location: [facility];  Service: Endoscopy;  Laterality: N/A;  12:30pm-pt notified to arrive at 9:15am for 10:45am procedure per KF  . ELBOW SURGERY Left   . FOOT SURGERY    . HERNIA REPAIR Right    inguinal- age 67  . KNEE ARTHROSCOPY WITH MEDIAL MENISECTOMY Left 12/07/2016   Procedure: KNEE ARTHROSCOPY WITH MEDIAL MENISECTOMY;  Surgeon: Carole Civil, MD;  Location: [facility];  Service: Orthopedics;  Laterality: Left;  . KNEE SURGERY    . left elbow    . LUMBAR LAMINECTOMY/DECOMPRESSION MICRODISCECTOMY Left 10/06/2013   Procedure: Left Lumbar Three-four microdiskectomy;  Surgeon: Ophelia Charter, MD;  Location: [facility];  Service: Neurosurgery;  Laterality: Left;  Left Lumbar Three-four microdiskectomy  . right foot     forgein body removal  . right knee  orif right patella [place] 1993  . SEPTOPLASTY          Home Medications    Prior to Admission medications   Medication  Sig Start Date End Date Taking? Authorizing Provider  ciprofloxacin (CIPRO) 500 MG tablet Take 500 mg by mouth 2 (two) times daily.  05/13/18  Yes [provider]  cloNIDine (CATAPRES) 0.1 MG tablet Take 0.05-0.1 mg by mouth 3 (three) times daily as needed (for increased heart rate).    Yes [provider]  diazepam (VALIUM) 10 MG tablet Take 10 mg by mouth 2 (two) times a day.  05/19/18  Yes [provider]  hydrochlorothiazide (HYDRODIURIL) 25 MG tablet Take 25 mg by mouth daily.  06/05/18  Yes [provider]  HYDROcodone-acetaminophen (NORCO) 10-325 MG tablet Take 1 tablet by mouth every 6 (six) hours as needed for moderate pain.    Yes  [provider]  naproxen sodium (ANAPROX) 550 MG tablet Take 550 mg by mouth daily as needed (for pain).   Yes [provider]  pantoprazole (PROTONIX) 40 MG tablet Take 40 mg by mouth daily.  05/09/18  Yes [provider]    Family History Family History  Problem Relation Age of Onset  . Heart disease Other   . Arthritis Other   . Cancer Other   . Asthma Other   . Diabetes Other   . Kidney disease Other   . Colon cancer Father 62    Social History Social History   Tobacco Use  . Smoking status: Former Smoker    Packs/day: 1.00    Years: 15.00    Pack years: 15.00    Types: Cigarettes    Last attempt to quit: 12/21/2012    Years since quitting: 5.6  . Smokeless tobacco: Former Systems developer    Types: [place] date: 12/05/1990  Substance Use Topics  . Alcohol use: No  . Drug use: Yes    Frequency: 2.0 times per week    Types: Marijuana    Comment: for pain/anxiety; occ 02/28/17     Allergies   Patient has no known allergies.   Review of Systems Review of Systems  Constitutional: Positive for activity change.  Gastrointestinal: Negative for nausea and vomiting.  Musculoskeletal: Positive for arthralgias.  Allergic/Immunologic: Negative for immunocompromised state.  Neurological: Negative for numbness.  Hematological: Does not bruise/bleed easily.     Physical Exam Updated Vital Signs BP (!) 128/95   Pulse 67   Temp 98.2 F (36.8 C) (Oral)   Resp 16   SpO2 94%   Physical Exam Vitals signs and nursing note reviewed.  Constitutional:      Appearance: He is well-developed.  HENT:     Head: Atraumatic.  Neck:     Musculoskeletal: Neck supple.  Cardiovascular:     Rate and Rhythm: Normal rate.  Pulmonary:     Effort: Pulmonary effort is normal.  Skin:    General: Skin is warm.  Neurological:     Mental Status: He is alert and oriented to person, place, and time.      ED Treatments / Results  Labs (all labs ordered are  listed, but only abnormal results are displayed) Labs Reviewed - No data to display  EKG None  Radiology Dg Ankle Complete Right  Result Date: 07/30/2018 CLINICAL DATA:  Injury 1 month ago.  Pain with walking. EXAM: RIGHT ANKLE - COMPLETE 3+ VIEW COMPARISON:  None. FINDINGS: No acute fracture. No dislocation. Spurring at the posterior calcaneus. Degenerative changes of the ankle joint and tarsal articulations are noted. Soft tissue swelling about the ankle is present. IMPRESSION: No acute bony pathology.  Chronic changes. Electronically Signed   By: Marybelle Killings M.D.   On: 07/30/2018 16:03    Procedures Procedures (including critical care time)  Medications Ordered in ED Medications  naproxen (NAPROSYN) tablet 500 mg (500 mg Oral Given 07/30/18 1642)     Initial Impression / Assessment and Plan / ED Course  I have reviewed the triage vital signs and the nursing notes.  Pertinent labs & imaging results that were available during my care of the patient were reviewed by me and considered in my medical decision making (see chart for details).        48 year old male comes in a chief complaint of right-sided ankle pain.  X-ray ordered, it does not reveal any evidence of fracture.  Patient does not have any gross deformity, he is neurovascularly intact.  We will put ASO brace, and recommend rice treatment.  He is to follow-up with Dr. Aline Brochure, for his left knee.  No need for emergent arthrocentesis, as the left knee effusion is because of osteoarthritis.  Final Clinical Impressions(s) / ED Diagnoses   Final diagnoses:  Sprain of right ankle, unspecified ligament, initial encounter  Arthropathy of left knee    ED Discharge Orders    None       Varney Biles, MD 07/30/18 1732

## 2018-07-30 NOTE — Discharge Instructions (Addendum)
Please continue using the crutches and apply the ankle brace for extra support.  Keep the leg elevated.  Follow-up with Dr. Aline Brochure for further evaluation of your left knee and also to ensure that the right ankle is healing well.  X-ray is not showing any signs of fracture over your ankle.

## 2018-07-30 NOTE — ED Notes (Signed)
ED Provider at bedside. 

## 2018-07-31 NOTE — Telephone Encounter (Signed)
Monday in person   Xray ankle if not done

## 2018-08-05 ENCOUNTER — Encounter: Payer: Self-pay | Admitting: Orthopedic Surgery

## 2018-08-05 ENCOUNTER — Other Ambulatory Visit: Payer: Self-pay

## 2018-08-05 ENCOUNTER — Ambulatory Visit: Payer: Medicaid Other | Admitting: Orthopedic Surgery

## 2018-08-05 VITALS — BP 132/93 | HR 56 | Temp 97.6°F | Ht 71.0 in | Wt 236.0 lb

## 2018-08-05 DIAGNOSIS — M25571 Pain in right ankle and joints of right foot: Secondary | ICD-10-CM

## 2018-08-05 DIAGNOSIS — M25462 Effusion, left knee: Secondary | ICD-10-CM

## 2018-08-05 NOTE — Patient Instructions (Signed)

## 2018-08-05 NOTE — Progress Notes (Signed)
Progress Note   Patient ID: Jamie [name], male   DOB: 25-Apr-1970, 48 y.o.   MRN: [id number]   Chief Complaint  Patient presents with  . Knee Pain    left/ patient states he rested this weekend and does not need aspiration injection today     HPI The patient presents for evaluation of recurrent swelling left knee new onset pain swelling right ankle.  The patient sprained his right ankle a few weeks ago.  Complains of mild pain with range of motion but is able to weight-bear swelling is gone down.  Left knee recurrent swelling pain decreased range of motion although he has noticed a decreased amount of swelling with rest and ice and he is using his cane  Review of Systems  Constitutional: Negative for chills and fever.  Musculoskeletal:       No mechanical symptoms  Skin: Negative.    Current Meds  Medication Sig  . ciprofloxacin (CIPRO) 500 MG tablet Take 500 mg by mouth 2 (two) times daily.   . cloNIDine (CATAPRES) 0.1 MG tablet Take 0.05-0.1 mg by mouth 3 (three) times daily as needed (for increased heart rate).   . diazepam (VALIUM) 10 MG tablet Take 10 mg by mouth 2 (two) times a day.   . hydrochlorothiazide (HYDRODIURIL) 25 MG tablet Take 25 mg by mouth daily.   Marland Kitchen HYDROcodone-acetaminophen (NORCO) 10-325 MG tablet Take 1 tablet by mouth every 6 (six) hours as needed for moderate pain.   . pantoprazole (PROTONIX) 40 MG tablet Take 40 mg by mouth daily.     Past Medical History:  Diagnosis Date  . Anxiety   . Arthritis   . Chronic back pain   . Chronic knee pain   . GERD (gastroesophageal reflux disease)    occ  . HCV antibody positive   . Hypertension    "Dr Karie Kirks took me off meds"  diet control  . Lumbar radiculopathy   . Pre-diabetes      No Known Allergies   BP (!) 132/93   Pulse (!) 56   Temp 97.6 F (36.4 C)   Ht 5\' 11"  (1.803 m)   Wt 236 lb (107 kg)   BMI 32.92 kg/m    Physical Exam General appearance normal Oriented x3 normal Mood pleasant  affect normal Gait abnormal, supported by a cane  Ortho Exam Left ankle Inspection and palpation revealed no abnormalities Range of motion is full No instability was detected on stress testing Muscle tone and strength was normal without tremor Skin was warm dry and intact Good pulse and temperature were noted in the extremity Sensation revealed no abnormalities to light touch  Right ankle tenderness at the anterior talofibular ligament no swelling full passive and active range of motion without instability skin without ecchymosis pulse and temperature normal no edema normal sensation  Left knee medium sized effusion periarticular tenderness including suprapatellar pouch and peripatellar tenderness Flexion 120 degrees full extension Normal muscle tone Ligaments stable Skin without rash Pulses and perfusion normal sensation normal   MEDICAL DECISION MAKING   Imaging:  X-rays of the right ankle dated April 21 with an injury date stated 4 weeks prior no fracture dislocation there is a spur in the posterior calcaneus degenerative changes of the ankle joint and tarsal articulations are noted  Report was reviewed and correlates with my findings   Encounter Diagnoses  Name Primary?  . Effusion of knee joint, left Yes  . Acute right ankle pain  PLAN: (RX., injection, surgery,frx,mri/ct, XR 2 body ares) Procedure note injection and aspiration left knee joint  Verbal consent was obtained to aspirate and inject the left knee joint   Timeout was completed to confirm the site of aspiration and injection  An 18-gauge needle was used to aspirate the left knee joint from a suprapatellar lateral approach.  The medications used were 40 mg of Depo-Medrol and 1% lidocaine 3 cc  Anesthesia was provided by ethyl chloride and the skin was prepped with alcohol.  After cleaning the skin with alcohol an 18-gauge needle was used to aspirate the right knee joint.  We obtained 28 cc of  fluid, yellow clear  We followed this by injection of 40 mg of Depo-Medrol and 3 cc 1% lidocaine.  There were no complications. A sterile bandage was applied.  Patient is now ambulatory with his ankle no major symptoms or instability normal care can be administered over-the-counter medicines full weightbearing  Fu prn   Encounter Diagnoses  Name Primary?  . Effusion of knee joint, left Yes  . Acute right ankle pain     No orders of the defined types were placed in this encounter.  8:48 AM 08/05/2018

## 2018-09-11 ENCOUNTER — Encounter: Payer: Self-pay | Admitting: Orthopedic Surgery

## 2018-09-11 ENCOUNTER — Ambulatory Visit: Payer: Medicaid Other | Admitting: Orthopedic Surgery

## 2018-09-11 ENCOUNTER — Other Ambulatory Visit: Payer: Self-pay

## 2018-09-11 VITALS — BP 136/84 | HR 65 | Temp 97.0°F | Ht 71.0 in | Wt 240.0 lb

## 2018-09-11 DIAGNOSIS — M25462 Effusion, left knee: Secondary | ICD-10-CM | POA: Diagnosis not present

## 2018-09-11 NOTE — Progress Notes (Signed)
Chief Complaint  Patient presents with  . Knee Pain    Lt knee wants aspirated and injected    Procedure note injection and aspiration left knee joint  Verbal consent was obtained to aspirate and inject the left knee joint   Timeout was completed to confirm the site of aspiration and injection  An 18-gauge needle was used to aspirate the left knee joint from a suprapatellar lateral approach.  The medications used were 40 mg of Depo-Medrol and 1% lidocaine 3 cc  Anesthesia was provided by ethyl chloride and the skin was prepped with alcohol.  After cleaning the skin with alcohol an 18-gauge needle was used to aspirate the right knee joint.  We obtained 25 cc of fluid CLEAR   We followed this by injection of 40 mg of Depo-Medrol and 3 cc 1% lidocaine.  There were no complications. A sterile bandage was applied.  Encounter Diagnosis  Name Primary?  . Effusion of knee joint, left Yes   FU PRN

## 2018-09-11 NOTE — Patient Instructions (Signed)
Ice  Rest

## 2018-11-01 ENCOUNTER — Ambulatory Visit: Payer: Medicaid Other | Admitting: Orthopedic Surgery

## 2018-11-01 ENCOUNTER — Other Ambulatory Visit: Payer: Self-pay

## 2018-11-01 ENCOUNTER — Encounter: Payer: Self-pay | Admitting: Orthopedic Surgery

## 2018-11-01 VITALS — BP 132/89 | HR 55 | Temp 97.3°F | Ht 71.0 in | Wt 233.0 lb

## 2018-11-01 DIAGNOSIS — M25462 Effusion, left knee: Secondary | ICD-10-CM

## 2018-11-01 NOTE — Patient Instructions (Signed)

## 2018-11-01 NOTE — Progress Notes (Signed)
Progress Note   Patient ID: Jamie [name], male   DOB: 1970/06/14, 48 y.o.   MRN: [id number]   Chief Complaint  Patient presents with  . Knee Pain    Left knee swelling and pain over 2 weeks    No diagnosis found.  No Known Allergies   Current Outpatient Medications:  .  cloNIDine (CATAPRES) 0.1 MG tablet, Take 0.05-0.1 mg by mouth 3 (three) times daily as needed (for increased heart rate). , Disp: , Rfl:  .  diazepam (VALIUM) 10 MG tablet, Take 10 mg by mouth 2 (two) times a day. , Disp: , Rfl:  .  hydrochlorothiazide (HYDRODIURIL) 25 MG tablet, Take 25 mg by mouth daily. , Disp: , Rfl:  .  HYDROcodone-acetaminophen (NORCO) 10-325 MG tablet, Take 1 tablet by mouth every 6 (six) hours as needed for moderate pain. , Disp: , Rfl:  .  pantoprazole (PROTONIX) 40 MG tablet, Take 40 mg by mouth daily. , Disp: , Rfl:    48 year old male with chronic knee effusions presents with swelling and pain    Review of Systems  Constitutional: Negative for fever.  Skin: Negative.       BP 132/89   Pulse (!) 55   Ht 5\' 11"  (1.803 m)   Wt 233 lb (105.7 kg)   BMI 32.50 kg/m   Physical Exam Vitals signs and nursing note reviewed.  Constitutional:      Appearance: Normal appearance.  Musculoskeletal:     Left knee: He exhibits decreased range of motion and effusion. He exhibits no swelling, no ecchymosis, no deformity, normal alignment and no bony tenderness. Tenderness found. Medial joint line tenderness noted.  Neurological:     Mental Status: He is alert and oriented to person, place, and time.  Psychiatric:        Mood and Affect: Mood normal.      Medical decisions:  (Established problem worse, x-ray ,physical therapy, over-the-counter medicines, read outside film or summarize x-ray)  Data  Imaging:   N/A  No diagnosis found.  PLAN:   Aspiration injection left knee  Procedure note injection and aspiration left knee joint  Verbal consent was obtained to aspirate  and inject the left knee joint   Timeout was completed to confirm the site of aspiration and injection  An 18-gauge needle was used to aspirate the left knee joint from a suprapatellar lateral approach.  The medications used were 40 mg of Depo-Medrol and 1% lidocaine 3 cc  Anesthesia was provided by ethyl chloride and the skin was prepped with alcohol.  After cleaning the skin with alcohol an 18-gauge needle was used to aspirate the right knee joint.  We obtained 75 cc of fluid CLEAR   We followed this by injection of 40 mg of Depo-Medrol and 3 cc 1% lidocaine.  There were no complications. A sterile bandage was applied.  FU PRN//INTERESTED IN [place] , HAS MEDICAID    Arther Abbott, MD 11/01/2018 9:06 AM

## 2019-01-08 ENCOUNTER — Encounter: Payer: Self-pay | Admitting: Orthopedic Surgery

## 2019-01-08 ENCOUNTER — Ambulatory Visit: Payer: Medicaid Other | Admitting: Orthopedic Surgery

## 2019-01-08 ENCOUNTER — Other Ambulatory Visit: Payer: Self-pay

## 2019-01-08 VITALS — BP 126/87 | HR 51 | Ht 71.0 in | Wt 233.0 lb

## 2019-01-08 DIAGNOSIS — M25462 Effusion, left knee: Secondary | ICD-10-CM | POA: Diagnosis not present

## 2019-01-08 NOTE — Patient Instructions (Signed)
Total Knee Replacement  Total knee replacement is a procedure to replace the damaged knee joint with an artificial (prosthetic) knee joint. The purpose of this surgery is to reduce knee pain and improve knee function. The prosthetic knee joint (prosthesis) may be made of metal, plastic, or ceramic. It replaces parts of the thigh bone (femur), lower leg bone (tibia), and kneecap (patella) that are removed during the procedure. Tell a health care provider about:  Any allergies you have.  All medicines you are taking, including vitamins, herbs, eye drops, creams, and over-the-counter medicines.  Any problems you or family members have had with anesthetic medicines.  Any blood disorders you have.  Any surgeries you have had.  Any medical conditions you have.  Whether you are pregnant or may be pregnant. What are the risks? Generally, this is a safe procedure. However, problems may occur, including:  Infection.  Bleeding.  Blood clot.  Allergic reactions to medicines.  Damage to nerves or other structures.  Decreased range of motion of the knee.  Instability of the knee.  Loosening of the prosthetic joint.  Knee pain that does not go away (chronic pain). What happens before the procedure? Staying hydrated Follow instructions from your health care provider about hydration, which may include:  Up to 2 hours before the procedure - you may continue to drink clear liquids, such as water, clear fruit juice, black coffee, and plain tea.  Eating and drinking restrictions Follow instructions from your health care provider about eating and drinking, which may include:  8 hours before the procedure - stop eating heavy meals or foods, such as meat, fried foods, or fatty foods.  6 hours before the procedure - stop eating light meals or foods, such as toast or cereal.  6 hours before the procedure - stop drinking milk or drinks that contain milk.  2 hours before the procedure -  stop drinking clear liquids. Medicines Ask your health care provider about:  Changing or stopping your regular medicines. This is especially important if you are taking diabetes medicines or blood thinners.  Taking medicines such as aspirin and ibuprofen. These medicines can thin your blood. Do not take these medicines unless your health care provider tells you to take them.  Taking over-the-counter medicines, vitamins, herbs, and supplements. Tests and exams  You may have a physical exam.  You may have tests, such as: ? X-rays. ? MRI. ? CT scan. ? Bone scans.  You may have a blood or urine sample taken. Lifestyle   If your health care provider prescribes physical therapy, do exercises as instructed.  Maintain good body and oral hygiene. Germs from anywhere in your body can travel to your new joint and infect it. Tell your health care provider if you: ? Plan to have dental care and routine cleanings. ? Develop any skin infections.  If you are overweight, work with your health care provider to reach a safe weight. Extra weight can put pressure on your knee.  Do not use any products that contain nicotine or tobacco, such as cigarettes, e-cigarettes, and chewing tobacco. These can delay healing after surgery. If you need help quitting, ask your health care provider. General instructions  Plan to have someone take you home from the hospital or clinic.  Plan to have a responsible adult care for you for at least 24 hours after you leave the hospital or clinic. This is important. It is recommended that you have someone to help care for you for at   least 4-6 weeks after your procedure.  Do not shave your legs just before surgery. If hair removal is needed, it will be done in the hospital.  Ask your health care provider how your surgical site will be marked or identified.  Ask your health care provider what steps will be taken to prevent infection. These may include: ? Removing hair  at the surgery site. ? Washing skin with a germ-killing soap. What happens during the procedure?  An IV will be inserted into one of your veins.  You will be given one or more of the following: ? A medicine to help you relax (sedative). ? A medicine that is injected into an area of your body near the nerves to numb everything below the injection site (peripheral nerve block). ? A medicine that is injected into your spine to numb the area below and slightly above the injection site (spinal anesthetic). ? A medicine to make you fall asleep (general anesthetic).  An incision will be made in your knee.  Damaged cartilage and bone will be removed from your femur, tibia, and patella.  Parts of the prosthesis (liners) will be placed over the areas of bone and cartilage that were removed. A metal liner will be placed over your femur, and plastic liners will be placed over your tibia and the underside of your patella.  One or more small tubes (drains) may be placed near your incision to help drain extra fluid from your surgery site.  Your incision will be closed with stitches (sutures), skin glue, or adhesive strips.  A bandage (dressing) will be placed over your incision. The procedure may vary among health care providers and hospitals. What happens after the procedure?  Your blood pressure, heart rate, breathing rate, and blood oxygen level will be monitored until you leave the hospital or clinic.  You will be given medicines to help manage pain.  You may: ? Continue to receive fluids through an IV. ? Have fluid coming from one or more drains in your incision. ? Have to wear compression stockings. These stockings help to prevent blood clots and reduce swelling in your legs. ? Be given a continuous passive motion machine to use. You will be shown how to use this machine.  You will be encouraged to move. A physical therapist will teach you how to use crutches or a walker. He or she will  also teach you how to exercise at home.  Do not drive until your health care provider approves. Summary  Total knee replacement is a procedure to replace the knee joint with an artificial (prosthetic) knee joint.  Before the procedure, follow instructions from your health care provider about eating and drinking.  Plan to have someone take you home from the hospital or clinic. This information is not intended to replace advice given to you by your health care provider. Make sure you discuss any questions you have with your health care provider. Document Released: 07/03/2000 Document Revised: 11/08/2017 Document Reviewed: 11/08/2017 Elsevier Patient Education  2020 [employer].

## 2019-01-08 NOTE — Progress Notes (Signed)
Chief Complaint  Patient presents with  . Knee Pain    left / wants to discuss surgery     This 48 year old male who is now in chronic pain management longstanding knee effusions presents with pain and swelling in his left knee also wants to discuss surgery  He wants to do surgery in October or early November.  He wants his sister to be here to help him with the preop consultation and counseling and discussion of postop care  Review of systems no erythema in the knee no new trauma knee is actually better than it was 5 days ago after rest  Physical Exam Vitals signs and nursing note reviewed.  Constitutional:      Appearance: Normal appearance.  Musculoskeletal:     Comments: Left knee effusion skin is warm dry and intact no erythema range of motion is limited by pain and swelling knee is stable strength is normal muscle tone is excellent skin is intact distal pulses are normal  Neurological:     Mental Status: He is alert and oriented to person, place, and time.  Psychiatric:        Mood and Affect: Mood normal.    Procedure note injection and aspiration left knee joint  Verbal consent was obtained to aspirate and inject the left knee joint   Timeout was completed to confirm the site of aspiration and injection  An 18-gauge needle was used to aspirate the left knee joint from a suprapatellar lateral approach.  The medications used were 40 mg of Depo-Medrol and 1% lidocaine 3 cc  Anesthesia was provided by ethyl chloride and the skin was prepped with alcohol.  After cleaning the skin with alcohol an 18-gauge needle was used to aspirate the right knee joint.  We obtained 50 cc of fluid clear yellow  We followed this by injection of 40 mg of Depo-Medrol and 3 cc 1% lidocaine.  There were no complications. A sterile bandage was applied.  Encounter Diagnosis  Name Primary?  . Effusion of knee joint, left Yes    Plan patient will come in for preop evaluation with his sister  surgery be done in November with a left total knee

## 2019-01-27 ENCOUNTER — Other Ambulatory Visit: Payer: Self-pay

## 2019-01-27 ENCOUNTER — Ambulatory Visit: Payer: Medicaid Other | Admitting: Orthopedic Surgery

## 2019-01-27 ENCOUNTER — Encounter: Payer: Self-pay | Admitting: Orthopedic Surgery

## 2019-01-27 VITALS — BP 129/80 | HR 54 | Ht 71.0 in | Wt 233.0 lb

## 2019-01-27 DIAGNOSIS — M1712 Unilateral primary osteoarthritis, left knee: Secondary | ICD-10-CM

## 2019-01-27 NOTE — Progress Notes (Signed)
Chief Complaint  Patient presents with  . Knee Pain    left/ ready to schedule surgery    48 year old male ready for left total knee surgery is here with his sister we discussed the preop postop and surgical complications possible as well as the plan for left total knee arthroplasty.  Counseling was performed for total of 10 minutes  The procedure has been fully reviewed with the patient; The risks and benefits of surgery have been discussed and explained and understood. Alternative treatment has also been reviewed, questions were encouraged and answered. The postoperative plan is also been reviewed.  Left tka

## 2019-01-27 NOTE — Addendum Note (Signed)
Addended byCandice Camp on: 01/27/2019 08:51 AM   Modules accepted: Orders, SmartSet

## 2019-01-27 NOTE — Patient Instructions (Signed)
You have decided to proceed with knee replacement surgery. You have decided not to continue with nonoperative measures such as but not limited to oral medication, weight loss, activity modification, physical therapy, bracing, or injection.  We will perform the procedure commonly known as total knee replacement. Some of the risks associated with knee replacement surgery include but are not limited to Bleeding Infection Swelling Stiffness Blood clot Pulmonary embolism  Loosening of the implant Pain that persists even after surgery  Infection is especially devastating complication of knee surgery although rare. If infection does occur your implant will usually have to be removed and several surgeries and antibiotics will be needed to eradicate the infection prior to performing a repeat replacement.   In some cases amputation is required to eradicate the infection. In other rare cases a knee fusion is needed   In compliance with recent [place] law in federal regulation regarding opioid use and abuse and addiction, we will taper (stop) opioid medication after 2 weeks.  If you're not comfortable with these risks and would like to continue with nonoperative treatment please let Dr. Harrison know prior to your surgery.  

## 2019-02-11 ENCOUNTER — Other Ambulatory Visit: Payer: Self-pay | Admitting: Radiology

## 2019-02-11 ENCOUNTER — Other Ambulatory Visit: Payer: Self-pay | Admitting: Orthopedic Surgery

## 2019-02-11 DIAGNOSIS — M1712 Unilateral primary osteoarthritis, left knee: Secondary | ICD-10-CM

## 2019-02-13 ENCOUNTER — Other Ambulatory Visit: Payer: Self-pay

## 2019-02-13 NOTE — Patient Instructions (Signed)
Jamie [name]  02/13/2019     @PREFPERIOPPHARMACY @   Your procedure is scheduled on  02/18/2019   Report to [name] at  [facility]  Call this number if you have problems the morning of surgery:  [phone]   Remember:  Do not eat or drink after midnight.                          Take these medicines the morning of surgery with A SIP OF WATER  Clonidine, valium(if needed), hydrocodone(if needed), protonix.    Do not wear jewelry, make-up or nail polish.  Do not wear lotions, powders, or perfume. Please wear deodorant and brush your teeth..  Do not shave 48 hours prior to surgery.  Men may shave face and neck.  Do not bring valuables to the hospital.  [facility] is not responsible for any belongings or valuables.  Contacts, dentures or bridgework may not be worn into surgery.  Leave your suitcase in the car.  After surgery it may be brought to your room.  For patients admitted to the hospital, discharge time will be determined by your treatment team.  Patients discharged the day of surgery will not be allowed to drive home.   Name and phone number of your driver:   family Special instructions:  None  Please read over the following fact sheets that you were given. Pain Booklet, Coughing and Deep Breathing, Blood Transfusion Information, Total Joint Packet, MRSA Information, Surgical Site Infection Prevention, Anesthesia Post-op Instructions and Care and Recovery After Surgery       Total Knee Replacement, Care After This sheet gives you information about how to care for yourself after your procedure. Your health care provider may also give you more specific instructions. If you have problems or questions, contact your health care provider. What can I expect after the procedure? After the procedure, it is common to have:  Pain.  Swelling.  A small amount of blood or clear fluid coming from your incision.  Limited range of motion. Follow these  instructions at home: Medicines  Take over-the-counter and prescription medicines only as told by your health care provider.  If you were prescribed a blood thinner (anticoagulant), take it as told by your health care provider.  Ask your health care provider if the medicine prescribed to you: ? Requires you to avoid driving or using heavy machinery. ? Can cause constipation. You may need to take actions to prevent or treat constipation, such as:  Drink enough fluid to keep your urine pale yellow.  Take over-the-counter or prescription medicines.  Eat foods that are high in fiber, such as beans, whole grains, and fresh fruits and vegetables.  Limit foods that are high in fat and processed sugars, such as fried or sweet foods. Bathing  Do not take baths, swim, or use a hot tub until your health care provider approves. Ask your health care provider if you may take showers. You may only be allowed to take sponge baths.  Keep your bandage (dressing) dry until your health care provider says it can be removed. Incision care and drain care   Follow instructions from your health care provider about how to take care of your incision. Make sure you: ? Wash your hands with soap and water before and after you change your dressing. If soap and water are not available, use hand sanitizer. ?  Change your dressing as told by your health care provider. ? Leave stitches (sutures), skin glue, or adhesive strips in place. These skin closures may need to stay in place for 2 weeks or longer. If adhesive strip edges start to loosen and curl up, you may trim the loose edges. Do not remove adhesive strips completely unless your health care provider tells you to do that.  Check your incision area and drain site every day for signs of infection. Check for: ? More redness, swelling, or pain. ? More fluid or blood. ? Warmth. ? Pus or a bad smell.  If you have a drain, follow instructions from your health care  provider about caring for it. Managing pain, stiffness, and swelling      If directed, put ice on your knee. ? Put ice in a plastic bag or use the icing device (cold flow pad or cryocuff) that you were given. Follow instructions from your health care provider about how to use the icing device. ? Place a towel between your skin and the bag or between your skin and the icing device. ? Leave the ice on for 20 minutes, 2-3 times per day.  If directed, apply heat to the affected area before you exercise. Use the heat source that your health care provider recommends, such as a moist heat pack or a heating pad. ? Place a towel between your skin and the heat source. ? Leave the heat on for 20-30 minutes. ? Remove the heat if your skin turns bright red. This is especially important if you are unable to feel pain, heat, or cold. You may have a greater risk of getting burned.  Move your toes often to avoid stiffness and to lessen swelling.  Raise (elevate) your leg above the level of your heart while you are sitting or lying down. ? Use several pillows to keep your leg straight. ? Do not put a pillow just under the knee. If the knee is bent for a long time, this may lead to stiffness.  Wear elastic knee support as told by your health care provider. Activity  Rest as told by your health care provider.  Avoid sitting for a long time without moving. Get up to take short walks every 1-2 hours. This is important to improve blood flow and breathing. Ask for help if you feel weak or unsteady.  Ask your health care provider what activities are safe for you.  Avoid high-impact activities, including running, jumping rope, and jumping jacks.  Do not play contact sports until your health care provider approves.  Do exercises as told by your physical therapist.  If you have been sent home with a continuous passive motion machine, use it as told by your health care provider. Safety   Do not use your  leg to support your body weight until your health care provider approves. Use crutches or a walker as told by your health care provider.  Do not drive until your health care provider approves. Ask your health care provider when it is safe to drive. General instructions  Do not use any products that contain nicotine or tobacco, such as cigarettes, e-cigarettes, and chewing tobacco. These can delay healing after surgery. If you need help quitting, ask your health care provider.  Wear compression stockings as told by your health care provider.  Tell your health care provider if you plan to have dental work. Also, tell your dentist about your joint replacement.  Keep all follow-up visits as  told by your health care provider. This is important. Contact a health care provider if you have:  More redness, swelling, or pain around your incision or drain.  More fluid or blood coming from your incision or drain.  Pus or a bad smell coming from your incision or drain.  Warmth on your incision or drain site.  A fever.  An incision that breaks open.  Knee pain that does not go away.  Range of motion in your knee that is getting worse.  A prosthesis that feels loose. Get help right away if you have:  Pain or swelling in your calf or thigh.  Shortness of breath or difficulty breathing.  Chest pain. Summary  After the procedure, it is common to have pain and swelling, blood or fluid coming from your incision, and limited range of motion.  Follow instructions from your health care provider about how to take care of your incision.  Use crutches or a walker as told by your health care provider.  If you were prescribed a blood thinner (anticoagulant), take it as told by your health care provider.  Keep all follow-up visits as told by your health care provider. This is important. This information is not intended to replace advice given to you by your health care provider. Make sure you  discuss any questions you have with your health care provider. Document Released: 10/14/2004 Document Revised: 04/19/2018 Document Reviewed: 11/08/2017 Elsevier Interactive Patient Education  [place].  Spinal Anesthesia and Epidural Anesthesia, Care After This sheet gives you information about how to care for yourself after your procedure. Your doctor may also give you more specific instructions. If you have problems or questions, call your doctor. Follow these instructions at home: For at least 24 hours after the procedure:   Have a responsible adult stay with you. It is important to have someone help care for you until you are awake and alert.  Rest as needed.  Do not do activities where you could fall or get hurt (injured).  Do not drive.  Do not use heavy machinery.  Do not drink alcohol.  Do not take sleeping pills or medicines that make you sleepy (drowsy).  Do not make important decisions.  Do not sign legal documents.  Do not take care of children on your own. Eating and drinking  If you throw up (vomit), drink water, juice, or soup when nausea and vomiting stop.  Drink enough fluid to keep your pee (urine) pale yellow.  Make sure you do not feel like throwing up (nauseous) before you eat solid foods.  Follow the diet that your doctor recommends. General instructions  Return to your normal activities as told by your doctor. Ask your doctor what activities are safe for you.  Take over-the-counter and prescription medicines only as told by your doctor.  If you have sleep apnea, surgery and certain medicines can raise your risk for breathing problems. Follow instructions from your doctor about when to wear your sleep device. Your doctor may tell you to wear your sleep device: ? Anytime you are sleeping, including during daytime naps. ? While taking prescription pain medicines, sleeping pills, or medicines that make you sleepy.  Do not use any products  that contain nicotine or tobacco. This includes cigarettes and e-cigarettes. ? If you need help quitting, ask your doctor. ? If you smoke, do not smoke by yourself. Make sure someone is nearby in case you need help.  Keep all follow-up visits as told  by your doctor. This is important. Contact a doctor if:  It has been more than one day since your procedure and you feel like throwing up.  It has been more than one day since your procedure and you throw up.  You have a rash. Get help right away if:  You have a fever.  You have a headache that lasts a long time.  You have a very bad headache.  Your vision is blurry.  You see two of a single object (double vision).  You are dizzy or light-headed.  You faint.  Your arms or legs tingle, feel weak, or get numb.  You have trouble breathing.  You cannot pee (urinate). Summary  After the procedure, have a responsible adult stay with you at home until you are fully awake and alert.  Do not do activities that might get you injured. Do not drive, use heavy machinery, drink alcohol, or make important decisions for 24 hours after the procedure.  Take medicines as told by your doctor. Do not use products that contain nicotine or tobacco.  Get help right away if you have a fever, blurry vision, difficulty breathing or passing urine, or weakness or numbness in arms or legs. This information is not intended to replace advice given to you by your health care provider. Make sure you discuss any questions you have with your health care provider. Document Released: 07/19/2015 Document Revised: 03/09/2017 Document Reviewed: 07/19/2015 Elsevier Patient Education  2020 [place] Anesthesia, Adult, Care After This sheet gives you information about how to care for yourself after your procedure. Your health care provider may also give you more specific instructions. If you have problems or questions, contact your health care  provider. What can I expect after the procedure? After the procedure, the following side effects are common:  Pain or discomfort at the IV site.  Nausea.  Vomiting.  Sore throat.  Trouble concentrating.  Feeling cold or chills.  Weak or tired.  Sleepiness and fatigue.  Soreness and body aches. These side effects can affect parts of the body that were not involved in surgery. Follow these instructions at home:  For at least 24 hours after the procedure:  Have a responsible adult stay with you. It is important to have someone help care for you until you are awake and alert.  Rest as needed.  Do not: ? Participate in activities in which you could fall or become injured. ? Drive. ? Use heavy machinery. ? Drink alcohol. ? Take sleeping pills or medicines that cause drowsiness. ? Make important decisions or sign legal documents. ? Take care of children on your own. Eating and drinking  Follow any instructions from your health care provider about eating or drinking restrictions.  When you feel hungry, start by eating small amounts of foods that are soft and easy to digest (bland), such as toast. Gradually return to your regular diet.  Drink enough fluid to keep your urine pale yellow.  If you vomit, rehydrate by drinking water, juice, or clear broth. General instructions  If you have sleep apnea, surgery and certain medicines can increase your risk for breathing problems. Follow instructions from your health care provider about wearing your sleep device: ? Anytime you are sleeping, including during daytime naps. ? While taking prescription pain medicines, sleeping medicines, or medicines that make you drowsy.  Return to your normal activities as told by your health care provider. Ask your health care provider what activities are safe  for you.  Take over-the-counter and prescription medicines only as told by your health care provider.  If you smoke, do not smoke  without supervision.  Keep all follow-up visits as told by your health care provider. This is important. Contact a health care provider if:  You have nausea or vomiting that does not get better with medicine.  You cannot eat or drink without vomiting.  You have pain that does not get better with medicine.  You are unable to pass urine.  You develop a skin rash.  You have a fever.  You have redness around your IV site that gets worse. Get help right away if:  You have difficulty breathing.  You have chest pain.  You have blood in your urine or stool, or you vomit blood. Summary  After the procedure, it is common to have a sore throat or nausea. It is also common to feel tired.  Have a responsible adult stay with you for the first 24 hours after general anesthesia. It is important to have someone help care for you until you are awake and alert.  When you feel hungry, start by eating small amounts of foods that are soft and easy to digest (bland), such as toast. Gradually return to your regular diet.  Drink enough fluid to keep your urine pale yellow.  Return to your normal activities as told by your health care provider. Ask your health care provider what activities are safe for you. This information is not intended to replace advice given to you by your health care provider. Make sure you discuss any questions you have with your health care provider. Document Released: 07/03/2000 Document Revised: 03/30/2017 Document Reviewed: 11/10/2016 Elsevier Patient Education  2020 [employer]. How to Use Chlorhexidine for Bathing Chlorhexidine gluconate (CHG) is a germ-killing (antiseptic) solution that is used to clean the skin. It can get rid of the bacteria that normally live on the skin and can keep them away for about 24 hours. To clean your skin with CHG, you may be given:  A CHG solution to use in the shower or as part of a sponge bath.  A prepackaged cloth that contains  CHG. Cleaning your skin with CHG may help lower the risk for infection:  While you are staying in the intensive care unit of the hospital.  If you have a vascular access, such as a central line, to provide short-term or long-term access to your veins.  If you have a catheter to drain urine from your bladder.  If you are on a ventilator. A ventilator is a machine that helps you breathe by moving air in and out of your lungs.  After surgery. What are the risks? Risks of using CHG include:  A skin reaction.  Hearing loss, if CHG gets in your ears.  Eye injury, if CHG gets in your eyes and is not rinsed out.  The CHG product catching fire. Make sure that you avoid smoking and flames after applying CHG to your skin. Do not use CHG:  If you have a chlorhexidine allergy or have previously reacted to chlorhexidine.  On babies younger than 29 months of age. How to use CHG solution  Use CHG only as told by your health care provider, and follow the instructions on the label.  Use the full amount of CHG as directed. Usually, this is one bottle. During a shower Follow these steps when using CHG solution during a shower (unless your health care provider gives you  different instructions): 1. Start the shower. 2. Use your normal soap and shampoo to wash your face and hair. 3. Turn off the shower or move out of the shower stream. 4. Pour the CHG onto a clean washcloth. Do not use any type of brush or rough-edged sponge. 5. Starting at your neck, lather your body down to your toes. Make sure you follow these instructions: ? If you will be having surgery, pay special attention to the part of your body where you will be having surgery. Scrub this area for at least 1 minute. ? Do not use CHG on your head or face. If the solution gets into your ears or eyes, rinse them well with water. ? Avoid your genital area. ? Avoid any areas of skin that have broken skin, cuts, or scrapes. ? Scrub your back  and under your arms. Make sure to wash skin folds. 6. Let the lather sit on your skin for 1-2 minutes or as long as told by your health care provider. 7. Thoroughly rinse your entire body in the shower. Make sure that all body creases and crevices are rinsed well. 8. Dry off with a clean towel. Do not put any substances on your body afterward--such as powder, lotion, or perfume--unless you are told to do so by your health care provider. Only use lotions that are recommended by the manufacturer. 9. Put on clean clothes or pajamas. 10. If it is the night before your surgery, sleep in clean sheets.  During a sponge bath Follow these steps when using CHG solution during a sponge bath (unless your health care provider gives you different instructions): 1. Use your normal soap and shampoo to wash your face and hair. 2. Pour the CHG onto a clean washcloth. 3. Starting at your neck, lather your body down to your toes. Make sure you follow these instructions: ? If you will be having surgery, pay special attention to the part of your body where you will be having surgery. Scrub this area for at least 1 minute. ? Do not use CHG on your head or face. If the solution gets into your ears or eyes, rinse them well with water. ? Avoid your genital area. ? Avoid any areas of skin that have broken skin, cuts, or scrapes. ? Scrub your back and under your arms. Make sure to wash skin folds. 4. Let the lather sit on your skin for 1-2 minutes or as long as told by your health care provider. 5. Using a different clean, wet washcloth, thoroughly rinse your entire body. Make sure that all body creases and crevices are rinsed well. 6. Dry off with a clean towel. Do not put any substances on your body afterward--such as powder, lotion, or perfume--unless you are told to do so by your health care provider. Only use lotions that are recommended by the manufacturer. 7. Put on clean clothes or pajamas. 8. If it is the night  before your surgery, sleep in clean sheets. How to use CHG prepackaged cloths  Only use CHG cloths as told by your health care provider, and follow the instructions on the label.  Use the CHG cloth on clean, dry skin.  Do not use the CHG cloth on your head or face unless your health care provider tells you to.  When washing with the CHG cloth: ? Avoid your genital area. ? Avoid any areas of skin that have broken skin, cuts, or scrapes. Before surgery Follow these steps when using a CHG  cloth to clean before surgery (unless your health care provider gives you different instructions): 1. Using the CHG cloth, vigorously scrub the part of your body where you will be having surgery. Scrub using a back-and-forth motion for 3 minutes. The area on your body should be completely wet with CHG when you are done scrubbing. 2. Do not rinse. Discard the cloth and let the area air-dry. Do not put any substances on the area afterward, such as powder, lotion, or perfume. 3. Put on clean clothes or pajamas. 4. If it is the night before your surgery, sleep in clean sheets.  For general bathing Follow these steps when using CHG cloths for general bathing (unless your health care provider gives you different instructions). 1. Use a separate CHG cloth for each area of your body. Make sure you wash between any folds of skin and between your fingers and toes. Wash your body in the following order, switching to a new cloth after each step: ? The front of your neck, shoulders, and chest. ? Both of your arms, under your arms, and your hands. ? Your stomach and groin area, avoiding the genitals. ? Your right leg and foot. ? Your left leg and foot. ? The back of your neck, your back, and your buttocks. 2. Do not rinse. Discard the cloth and let the area air-dry. Do not put any substances on your body afterward--such as powder, lotion, or perfume--unless you are told to do so by your health care provider. Only use  lotions that are recommended by the manufacturer. 3. Put on clean clothes or pajamas. Contact a health care provider if:  Your skin gets irritated after scrubbing.  You have questions about using your solution or cloth. Get help right away if:  Your eyes become very red or swollen.  Your eyes itch badly.  Your skin itches badly and is red or swollen.  Your hearing changes.  You have trouble seeing.  You have swelling or tingling in your mouth or throat.  You have trouble breathing.  You swallow any chlorhexidine. Summary  Chlorhexidine gluconate (CHG) is a germ-killing (antiseptic) solution that is used to clean the skin. Cleaning your skin with CHG may help to lower your risk for infection.  You may be given CHG to use for bathing. It may be in a bottle or in a prepackaged cloth to use on your skin. Carefully follow your health care provider's instructions and the instructions on the product label.  Do not use CHG if you have a chlorhexidine allergy.  Contact your health care provider if your skin gets irritated after scrubbing. This information is not intended to replace advice given to you by your health care provider. Make sure you discuss any questions you have with your health care provider. Document Released: 12/20/2011 Document Revised: 06/13/2018 Document Reviewed: 02/22/2017 Elsevier Patient Education  2020 [employer].

## 2019-02-14 ENCOUNTER — Other Ambulatory Visit: Payer: Self-pay

## 2019-02-14 ENCOUNTER — Encounter (HOSPITAL_COMMUNITY): Payer: Self-pay

## 2019-02-14 ENCOUNTER — Other Ambulatory Visit (HOSPITAL_COMMUNITY)
Admission: RE | Admit: 2019-02-14 | Discharge: 2019-02-14 | Disposition: A | Discharge status: Home / Self Care | Payer: Medicaid Other | Source: Ambulatory Visit | Attending: Orthopedic Surgery | Admitting: Orthopedic Surgery

## 2019-02-14 ENCOUNTER — Encounter (HOSPITAL_COMMUNITY)
Admission: RE | Admit: 2019-02-14 | Discharge: 2019-02-14 | Disposition: A | Discharge status: Home / Self Care | Payer: Medicaid Other | Source: Ambulatory Visit | Attending: Orthopedic Surgery | Admitting: Orthopedic Surgery

## 2019-02-14 DIAGNOSIS — Z01818 Encounter for other preprocedural examination: Secondary | ICD-10-CM | POA: Insufficient documentation

## 2019-02-14 DIAGNOSIS — Z20828 Contact with and (suspected) exposure to other viral communicable diseases: Secondary | ICD-10-CM | POA: Diagnosis not present

## 2019-02-14 LAB — SURGICAL PCR SCREEN
MRSA, PCR: NEGATIVE
Staphylococcus aureus: NEGATIVE

## 2019-02-14 LAB — BASIC METABOLIC PANEL
Anion gap: 8 (ref 5–15)
BUN: 22 mg/dL — ABNORMAL HIGH (ref 6–20)
CO2: 29 mmol/L (ref 22–32)
Calcium: 9.1 mg/dL (ref 8.9–10.3)
Chloride: 101 mmol/L (ref 98–111)
Creatinine, Ser: 1.1 mg/dL (ref 0.61–1.24)
GFR calc Af Amer: >60 mL/min (ref >60)
GFR calc non Af Amer: >60 mL/min (ref >60)
Glucose, Bld: 85 mg/dL (ref 70–99)
Potassium: 3.9 mmol/L (ref 3.5–5.1)
Sodium: 138 mmol/L (ref 135–145)

## 2019-02-14 LAB — CBC WITH DIFFERENTIAL/PLATELET
Abs Immature Granulocytes: 0.02 10*3/uL (ref 0.00–0.07)
Basophils Absolute: 0 10*3/uL (ref 0.0–0.1)
Basophils Relative: 1 %
Eosinophils Absolute: 0.1 10*3/uL (ref 0.0–0.5)
Eosinophils Relative: 2 %
HCT: 42.9 % (ref 39.0–52.0)
Hemoglobin: 14.8 g/dL (ref 13.0–17.0)
Immature Granulocytes: 0 %
Lymphocytes Relative: 40 %
Lymphs Abs: 2.3 10*3/uL (ref 0.7–4.0)
MCH: 31.8 pg (ref 26.0–34.0)
MCHC: 34.5 g/dL (ref 30.0–36.0)
MCV: 92.3 fL (ref 80.0–100.0)
Monocytes Absolute: 0.5 10*3/uL (ref 0.1–1.0)
Monocytes Relative: 8 %
Neutro Abs: 2.8 10*3/uL (ref 1.7–7.7)
Neutrophils Relative %: 49 %
Platelets: 193 10*3/uL (ref 150–400)
RBC: 4.65 MIL/uL (ref 4.22–5.81)
RDW: 13 % (ref 11.5–15.5)
WBC: 5.7 10*3/uL (ref 4.0–10.5)
nRBC: 0 % (ref 0.0–0.2)

## 2019-02-14 LAB — ABO/RH: ABO/RH(D): O POS

## 2019-02-14 LAB — HEMOGLOBIN A1C
Hgb A1c MFr Bld: 5.3 % (ref 4.8–5.6)
Mean Plasma Glucose: 105.41 mg/dL

## 2019-02-14 LAB — PREPARE RBC (CROSSMATCH)

## 2019-02-14 LAB — SARS CORONAVIRUS 2 (TAT 6-24 HRS): SARS Coronavirus 2: NEGATIVE

## 2019-02-14 LAB — GLUCOSE, CAPILLARY: Glucose-Capillary: 99 mg/dL (ref 70–99)

## 2019-02-17 ENCOUNTER — Other Ambulatory Visit: Payer: Self-pay

## 2019-02-18 ENCOUNTER — Ambulatory Visit (HOSPITAL_COMMUNITY): Payer: Medicaid Other | Admitting: Anesthesiology

## 2019-02-18 ENCOUNTER — Ambulatory Visit (HOSPITAL_COMMUNITY): Payer: Medicaid Other

## 2019-02-18 ENCOUNTER — Encounter (HOSPITAL_COMMUNITY)
Admission: RE | Disposition: A | Discharge status: Home / Self Care | Payer: Self-pay | Source: Home / Self Care | Attending: Orthopedic Surgery

## 2019-02-18 ENCOUNTER — Observation Stay (HOSPITAL_COMMUNITY)
Admission: RE | Admit: 2019-02-18 | Discharge: 2019-02-19 | Disposition: A | Discharge status: Home / Self Care | Payer: Medicaid Other | Attending: Orthopedic Surgery | Admitting: Orthopedic Surgery

## 2019-02-18 ENCOUNTER — Encounter (HOSPITAL_COMMUNITY): Payer: Self-pay | Admitting: *Deleted

## 2019-02-18 DIAGNOSIS — M1712 Unilateral primary osteoarthritis, left knee: Secondary | ICD-10-CM | POA: Diagnosis not present

## 2019-02-18 DIAGNOSIS — K219 Gastro-esophageal reflux disease without esophagitis: Secondary | ICD-10-CM | POA: Diagnosis not present

## 2019-02-18 DIAGNOSIS — M25762 Osteophyte, left knee: Secondary | ICD-10-CM | POA: Insufficient documentation

## 2019-02-18 DIAGNOSIS — Z87891 Personal history of nicotine dependence: Secondary | ICD-10-CM | POA: Insufficient documentation

## 2019-02-18 DIAGNOSIS — I10 Essential (primary) hypertension: Secondary | ICD-10-CM | POA: Insufficient documentation

## 2019-02-18 DIAGNOSIS — M25562 Pain in left knee: Secondary | ICD-10-CM | POA: Diagnosis present

## 2019-02-18 DIAGNOSIS — Z96659 Presence of unspecified artificial knee joint: Secondary | ICD-10-CM

## 2019-02-18 HISTORY — PX: TOTAL KNEE ARTHROPLASTY: SHX125

## 2019-02-18 LAB — GLUCOSE, CAPILLARY
Glucose-Capillary: 98 mg/dL (ref 70–99)
Glucose-Capillary: 132 mg/dL — ABNORMAL HIGH (ref 70–99)

## 2019-02-18 SURGERY — ARTHROPLASTY, KNEE, TOTAL
Anesthesia: Regional | Site: Knee | Laterality: Left

## 2019-02-18 MED ORDER — DEXAMETHASONE SODIUM PHOSPHATE 10 MG/ML IJ SOLN
INTRAMUSCULAR | Status: AC
  Filled 2019-02-18: qty 1

## 2019-02-18 MED ORDER — PREGABALIN 50 MG PO CAPS
ORAL_CAPSULE | ORAL | Status: AC
  Filled 2019-02-18: qty 1

## 2019-02-18 MED ORDER — FENTANYL CITRATE (PF) 100 MCG/2ML IJ SOLN
INTRAMUSCULAR | Status: DC | PRN
  Administered 2019-02-18 (×4): 50 ug via INTRAVENOUS
  Administered 2019-02-18 (×2): 100 ug via INTRAVENOUS
  Administered 2019-02-18 (×3): 50 ug via INTRAVENOUS
  Administered 2019-02-18 (×2): 25 ug via INTRAVENOUS
  Administered 2019-02-18: 50 ug via INTRAVENOUS

## 2019-02-18 MED ORDER — ROPIVACAINE HCL 5 MG/ML IJ SOLN
INTRAMUSCULAR | Status: AC
  Filled 2019-02-18: qty 30

## 2019-02-18 MED ORDER — MIDAZOLAM HCL 2 MG/2ML IJ SOLN
INTRAMUSCULAR | Status: AC
  Filled 2019-02-18: qty 2

## 2019-02-18 MED ORDER — METHOCARBAMOL 500 MG PO TABS
500.0000 mg | ORAL_TABLET | Freq: Four times a day (QID) | ORAL | Status: DC | PRN
  Administered 2019-02-18: 500 mg via ORAL
  Filled 2019-02-18: qty 1

## 2019-02-18 MED ORDER — PHENOL 1.4 % MT LIQD: 1.0000 | OROMUCOSAL | Status: DC | PRN

## 2019-02-18 MED ORDER — GABAPENTIN 300 MG PO CAPS
300.0000 mg | ORAL_CAPSULE | Freq: Three times a day (TID) | ORAL | Status: DC
  Administered 2019-02-18 – 2019-02-19 (×3): 300 mg via ORAL
  Filled 2019-02-18 (×3): qty 1

## 2019-02-18 MED ORDER — ROCURONIUM BROMIDE 100 MG/10ML IV SOLN
INTRAVENOUS | Status: DC | PRN
  Administered 2019-02-18: 40 mg via INTRAVENOUS
  Administered 2019-02-18 (×2): 20 mg via INTRAVENOUS

## 2019-02-18 MED ORDER — SODIUM CHLORIDE 0.9 % IV SOLN
INTRAVENOUS | Status: DC
  Administered 2019-02-18 – 2019-02-19 (×3): via INTRAVENOUS

## 2019-02-18 MED ORDER — ONDANSETRON HCL 4 MG/2ML IJ SOLN
INTRAMUSCULAR | Status: DC | PRN
  Administered 2019-02-18: 4 mg via INTRAVENOUS

## 2019-02-18 MED ORDER — LACTATED RINGERS IV SOLN
INTRAVENOUS | Status: DC
  Administered 2019-02-18 (×2): via INTRAVENOUS

## 2019-02-18 MED ORDER — PROPOFOL 10 MG/ML IV BOLUS
INTRAVENOUS | Status: DC | PRN
  Administered 2019-02-18: 200 mg via INTRAVENOUS

## 2019-02-18 MED ORDER — SODIUM CHLORIDE 0.9 % IR SOLN
Status: DC | PRN
  Administered 2019-02-18: 3000 mL

## 2019-02-18 MED ORDER — FENTANYL CITRATE (PF) 100 MCG/2ML IJ SOLN
INTRAMUSCULAR | Status: AC
  Filled 2019-02-18: qty 2

## 2019-02-18 MED ORDER — ONDANSETRON HCL 4 MG/2ML IJ SOLN
INTRAMUSCULAR | Status: AC
  Filled 2019-02-18: qty 2

## 2019-02-18 MED ORDER — CELECOXIB 100 MG PO CAPS
ORAL_CAPSULE | ORAL | Status: AC
  Filled 2019-02-18: qty 2

## 2019-02-18 MED ORDER — TRANEXAMIC ACID-NACL 1000-0.7 MG/100ML-% IV SOLN
1000.0000 mg | INTRAVENOUS | Status: AC
  Administered 2019-02-18: 1000 mg via INTRAVENOUS
  Filled 2019-02-18: qty 100

## 2019-02-18 MED ORDER — OXYCODONE HCL 5 MG PO TABS: 5.0000 mg | ORAL_TABLET | ORAL | Status: DC | PRN

## 2019-02-18 MED ORDER — HYDROMORPHONE HCL 1 MG/ML IJ SOLN
0.2500 mg | INTRAMUSCULAR | Status: DC | PRN
  Administered 2019-02-18 (×6): 0.5 mg via INTRAVENOUS
  Filled 2019-02-18 (×6): qty 0.5

## 2019-02-18 MED ORDER — HYDRALAZINE HCL 20 MG/ML IJ SOLN
INTRAMUSCULAR | Status: AC
  Filled 2019-02-18: qty 1

## 2019-02-18 MED ORDER — METHOCARBAMOL 1000 MG/10ML IJ SOLN
500.0000 mg | Freq: Once | INTRAVENOUS | Status: AC
  Administered 2019-02-18: 11:00:00 500 mg via INTRAVENOUS
  Filled 2019-02-18: qty 5

## 2019-02-18 MED ORDER — PANTOPRAZOLE SODIUM 40 MG PO TBEC: 40.0000 mg | DELAYED_RELEASE_TABLET | Freq: Every day | ORAL | Status: DC

## 2019-02-18 MED ORDER — BUPIVACAINE LIPOSOME 1.3 % IJ SUSP
INTRAMUSCULAR | Status: DC | PRN
  Administered 2019-02-18: 20 mL

## 2019-02-18 MED ORDER — SUCCINYLCHOLINE CHLORIDE 200 MG/10ML IV SOSY
PREFILLED_SYRINGE | INTRAVENOUS | Status: AC
  Filled 2019-02-18: qty 10

## 2019-02-18 MED ORDER — MIDAZOLAM HCL 2 MG/2ML IJ SOLN: 0.5000 mg | Freq: Once | INTRAMUSCULAR | Status: DC | PRN

## 2019-02-18 MED ORDER — BUPIVACAINE-EPINEPHRINE (PF) 0.5% -1:200000 IJ SOLN
INTRAMUSCULAR | Status: AC
  Filled 2019-02-18: qty 30

## 2019-02-18 MED ORDER — HYDROCODONE-ACETAMINOPHEN 7.5-325 MG PO TABS
1.0000 | ORAL_TABLET | Freq: Once | ORAL | Status: AC | PRN
  Administered 2019-02-18: 1 via ORAL
  Filled 2019-02-18: qty 1

## 2019-02-18 MED ORDER — CELECOXIB 100 MG PO CAPS
200.0000 mg | ORAL_CAPSULE | Freq: Two times a day (BID) | ORAL | Status: DC
  Administered 2019-02-18 – 2019-02-19 (×2): 200 mg via ORAL
  Filled 2019-02-18 (×3): qty 2

## 2019-02-18 MED ORDER — ONDANSETRON HCL 4 MG/2ML IJ SOLN
4.0000 mg | Freq: Four times a day (QID) | INTRAMUSCULAR | Status: DC | PRN
  Administered 2019-02-18: 18:00:00 4 mg via INTRAVENOUS
  Filled 2019-02-18: qty 2

## 2019-02-18 MED ORDER — CEFAZOLIN SODIUM-DEXTROSE 2-4 GM/100ML-% IV SOLN
2.0000 g | Freq: Four times a day (QID) | INTRAVENOUS | Status: AC
  Administered 2019-02-18 (×2): 2 g via INTRAVENOUS
  Filled 2019-02-18 (×2): qty 100

## 2019-02-18 MED ORDER — CEFAZOLIN SODIUM-DEXTROSE 2-4 GM/100ML-% IV SOLN
2.0000 g | INTRAVENOUS | Status: AC
  Administered 2019-02-18: 2 g via INTRAVENOUS
  Filled 2019-02-18: qty 100

## 2019-02-18 MED ORDER — ROPIVACAINE HCL 5 MG/ML IJ SOLN
INTRAMUSCULAR | Status: DC | PRN
  Administered 2019-02-18: 23 mL via PERINEURAL

## 2019-02-18 MED ORDER — MENTHOL 3 MG MT LOZG: 1.0000 | LOZENGE | OROMUCOSAL | Status: DC | PRN

## 2019-02-18 MED ORDER — ROCURONIUM BROMIDE 10 MG/ML (PF) SYRINGE
PREFILLED_SYRINGE | INTRAVENOUS | Status: AC
  Filled 2019-02-18: qty 10

## 2019-02-18 MED ORDER — BUPIVACAINE LIPOSOME 1.3 % IJ SUSP
INTRAMUSCULAR | Status: AC
  Filled 2019-02-18: qty 20

## 2019-02-18 MED ORDER — CELECOXIB 400 MG PO CAPS
400.0000 mg | ORAL_CAPSULE | Freq: Once | ORAL | Status: AC
  Administered 2019-02-18: 11:00:00 400 mg via ORAL

## 2019-02-18 MED ORDER — SUCCINYLCHOLINE CHLORIDE 20 MG/ML IJ SOLN
INTRAMUSCULAR | Status: DC | PRN
  Administered 2019-02-18: 140 mg via INTRAVENOUS

## 2019-02-18 MED ORDER — METHOCARBAMOL 1000 MG/10ML IJ SOLN
500.0000 mg | Freq: Four times a day (QID) | INTRAVENOUS | Status: DC | PRN
  Filled 2019-02-18: qty 5

## 2019-02-18 MED ORDER — SODIUM CHLORIDE FLUSH 0.9 % IV SOLN
INTRAVENOUS | Status: AC
  Filled 2019-02-18: qty 10

## 2019-02-18 MED ORDER — HYDRALAZINE HCL 20 MG/ML IJ SOLN
INTRAMUSCULAR | Status: DC | PRN
  Administered 2019-02-18: 10 mg via INTRAVENOUS
  Administered 2019-02-18 (×2): 5 mg via INTRAVENOUS

## 2019-02-18 MED ORDER — 0.9 % SODIUM CHLORIDE (POUR BTL) OPTIME
TOPICAL | Status: DC | PRN
  Administered 2019-02-18: 1000 mL

## 2019-02-18 MED ORDER — FENTANYL CITRATE (PF) 250 MCG/5ML IJ SOLN
INTRAMUSCULAR | Status: AC
  Filled 2019-02-18: qty 5

## 2019-02-18 MED ORDER — OXYCODONE HCL 5 MG PO TABS
5.0000 mg | ORAL_TABLET | Freq: Once | ORAL | Status: AC
  Administered 2019-02-18: 5 mg via ORAL
  Filled 2019-02-18: qty 1

## 2019-02-18 MED ORDER — ALUM & MAG HYDROXIDE-SIMETH 200-200-20 MG/5ML PO SUSP: 30.0000 mL | ORAL | Status: DC | PRN

## 2019-02-18 MED ORDER — TRAMADOL HCL 50 MG PO TABS
50.0000 mg | ORAL_TABLET | Freq: Four times a day (QID) | ORAL | Status: DC
  Administered 2019-02-18 – 2019-02-19 (×5): 50 mg via ORAL
  Filled 2019-02-18 (×4): qty 1

## 2019-02-18 MED ORDER — METOCLOPRAMIDE HCL 10 MG PO TABS: 5.0000 mg | ORAL_TABLET | Freq: Three times a day (TID) | ORAL | Status: DC | PRN

## 2019-02-18 MED ORDER — METOCLOPRAMIDE HCL 5 MG/ML IJ SOLN
5.0000 mg | Freq: Three times a day (TID) | INTRAMUSCULAR | Status: DC | PRN
  Administered 2019-02-18: 10 mg via INTRAVENOUS
  Filled 2019-02-18: qty 2

## 2019-02-18 MED ORDER — ONDANSETRON HCL 4 MG/2ML IJ SOLN
4.0000 mg | Freq: Once | INTRAMUSCULAR | Status: AC
  Administered 2019-02-18: 4 mg via INTRAVENOUS

## 2019-02-18 MED ORDER — POLYETHYLENE GLYCOL 3350 17 G PO PACK
17.0000 g | PACK | Freq: Every day | ORAL | Status: DC
  Administered 2019-02-19: 17 g via ORAL
  Filled 2019-02-18: qty 1

## 2019-02-18 MED ORDER — HYDROCHLOROTHIAZIDE 25 MG PO TABS
25.0000 mg | ORAL_TABLET | Freq: Every day | ORAL | Status: DC
  Administered 2019-02-19: 25 mg via ORAL
  Filled 2019-02-18: qty 1

## 2019-02-18 MED ORDER — MIDAZOLAM HCL 5 MG/5ML IJ SOLN
INTRAMUSCULAR | Status: DC | PRN
  Administered 2019-02-18: 2 mg via INTRAVENOUS

## 2019-02-18 MED ORDER — GLYCOPYRROLATE PF 0.2 MG/ML IJ SOSY
PREFILLED_SYRINGE | INTRAMUSCULAR | Status: DC | PRN
  Administered 2019-02-18: .2 mg via INTRAVENOUS

## 2019-02-18 MED ORDER — SUGAMMADEX SODIUM 200 MG/2ML IV SOLN
INTRAVENOUS | Status: DC | PRN
  Administered 2019-02-18: 216 mg via INTRAVENOUS

## 2019-02-18 MED ORDER — ZOLPIDEM TARTRATE 5 MG PO TABS
10.0000 mg | ORAL_TABLET | Freq: Every day | ORAL | Status: DC
  Administered 2019-02-18: 10 mg via ORAL
  Filled 2019-02-18: qty 2

## 2019-02-18 MED ORDER — DEXAMETHASONE SODIUM PHOSPHATE 4 MG/ML IJ SOLN
INTRAMUSCULAR | Status: DC | PRN
  Administered 2019-02-18: 8 mg via INTRAVENOUS

## 2019-02-18 MED ORDER — FLEET ENEMA 7-19 GM/118ML RE ENEM: 1.0000 | ENEMA | Freq: Once | RECTAL | Status: DC | PRN

## 2019-02-18 MED ORDER — LABETALOL HCL 5 MG/ML IV SOLN
INTRAVENOUS | Status: DC | PRN
  Administered 2019-02-18 (×2): 5 mg via INTRAVENOUS

## 2019-02-18 MED ORDER — PREGABALIN 50 MG PO CAPS
50.0000 mg | ORAL_CAPSULE | Freq: Once | ORAL | Status: AC
  Administered 2019-02-18: 11:00:00 50 mg via ORAL

## 2019-02-18 MED ORDER — TRANEXAMIC ACID-NACL 1000-0.7 MG/100ML-% IV SOLN
1000.0000 mg | Freq: Once | INTRAVENOUS | Status: AC
  Administered 2019-02-18: 1000 mg via INTRAVENOUS
  Filled 2019-02-18: qty 100

## 2019-02-18 MED ORDER — PROMETHAZINE HCL 25 MG/ML IJ SOLN
6.2500 mg | INTRAMUSCULAR | Status: AC | PRN
  Administered 2019-02-18 (×2): 6.25 mg via INTRAVENOUS
  Filled 2019-02-18: qty 1

## 2019-02-18 MED ORDER — SODIUM CHLORIDE 0.9 % IV SOLN: INTRAVENOUS | Status: DC

## 2019-02-18 MED ORDER — DOCUSATE SODIUM 100 MG PO CAPS
100.0000 mg | ORAL_CAPSULE | Freq: Two times a day (BID) | ORAL | Status: DC
  Administered 2019-02-18 – 2019-02-19 (×2): 100 mg via ORAL
  Filled 2019-02-18 (×2): qty 1

## 2019-02-18 MED ORDER — CHLORHEXIDINE GLUCONATE 4 % EX LIQD: 60.0000 mL | Freq: Once | CUTANEOUS | Status: DC

## 2019-02-18 MED ORDER — OXYCODONE HCL 5 MG PO TABS
10.0000 mg | ORAL_TABLET | ORAL | Status: DC | PRN
  Administered 2019-02-18: 22:00:00 10 mg via ORAL
  Filled 2019-02-18: qty 2

## 2019-02-18 MED ORDER — DIAZEPAM 5 MG PO TABS
10.0000 mg | ORAL_TABLET | Freq: Two times a day (BID) | ORAL | Status: DC
  Administered 2019-02-18 – 2019-02-19 (×2): 10 mg via ORAL
  Filled 2019-02-18 (×2): qty 2

## 2019-02-18 MED ORDER — PANTOPRAZOLE SODIUM 40 MG PO TBEC
40.0000 mg | DELAYED_RELEASE_TABLET | Freq: Every day | ORAL | Status: DC
  Administered 2019-02-19: 09:00:00 40 mg via ORAL
  Filled 2019-02-18: qty 1

## 2019-02-18 MED ORDER — POVIDONE-IODINE 10 % EX SWAB: 2.0000 "application " | Freq: Once | CUTANEOUS | Status: DC

## 2019-02-18 MED ORDER — CLONIDINE HCL 0.1 MG PO TABS
0.1000 mg | ORAL_TABLET | Freq: Three times a day (TID) | ORAL | Status: DC
  Administered 2019-02-18 – 2019-02-19 (×4): 0.1 mg via ORAL
  Filled 2019-02-18 (×7): qty 1

## 2019-02-18 MED ORDER — APIXABAN 2.5 MG PO TABS
2.5000 mg | ORAL_TABLET | Freq: Two times a day (BID) | ORAL | Status: DC
  Administered 2019-02-19: 2.5 mg via ORAL
  Filled 2019-02-18: qty 1

## 2019-02-18 MED ORDER — BISACODYL 5 MG PO TBEC: 5.0000 mg | DELAYED_RELEASE_TABLET | Freq: Every day | ORAL | Status: DC | PRN

## 2019-02-18 MED ORDER — ACETAMINOPHEN 500 MG PO TABS
1000.0000 mg | ORAL_TABLET | Freq: Four times a day (QID) | ORAL | Status: AC
  Administered 2019-02-18 – 2019-02-19 (×3): 1000 mg via ORAL
  Filled 2019-02-18 (×4): qty 2

## 2019-02-18 MED ORDER — SODIUM CHLORIDE (PF) 0.9 % IJ SOLN
INTRAMUSCULAR | Status: AC
  Filled 2019-02-18: qty 10

## 2019-02-18 MED ORDER — DEXAMETHASONE SODIUM PHOSPHATE 10 MG/ML IJ SOLN
10.0000 mg | Freq: Once | INTRAMUSCULAR | Status: AC
  Administered 2019-02-19: 10:00:00 10 mg via INTRAVENOUS
  Filled 2019-02-18: qty 1

## 2019-02-18 MED ORDER — HYDROMORPHONE HCL 1 MG/ML IJ SOLN
0.5000 mg | INTRAMUSCULAR | Status: DC | PRN
  Administered 2019-02-19 (×2): 1 mg via INTRAVENOUS
  Filled 2019-02-18 (×2): qty 1

## 2019-02-18 MED ORDER — LIDOCAINE HCL (CARDIAC) PF 100 MG/5ML IV SOSY
PREFILLED_SYRINGE | INTRAVENOUS | Status: DC | PRN
  Administered 2019-02-18: 80 mg via INTRAVENOUS

## 2019-02-18 MED ORDER — ONDANSETRON HCL 4 MG PO TABS: 4.0000 mg | ORAL_TABLET | Freq: Four times a day (QID) | ORAL | Status: DC | PRN

## 2019-02-18 MED ORDER — DIPHENHYDRAMINE HCL 12.5 MG/5ML PO ELIX: 12.5000 mg | ORAL_SOLUTION | ORAL | Status: DC | PRN

## 2019-02-18 MED ORDER — PROPOFOL 10 MG/ML IV BOLUS
INTRAVENOUS | Status: AC
  Filled 2019-02-18: qty 40

## 2019-02-18 SURGICAL SUPPLY — 69 items
BANDAGE ELASTIC 4 VELCRO NS (GAUZE/BANDAGES/DRESSINGS) ×6 IMPLANT
BANDAGE ELASTIC 6 VELCRO NS (GAUZE/BANDAGES/DRESSINGS) ×3 IMPLANT
BANDAGE ESMARK 6X9 LF (GAUZE/BANDAGES/DRESSINGS) ×1 IMPLANT
BIT DRILL 3.2X128 (BIT) IMPLANT
BIT DRILL 3.2X128MM (BIT)
BLADE HEX COATED 2.75 (ELECTRODE) ×3 IMPLANT
BLADE SAGITTAL 25.0X1.27X90 (BLADE) ×2 IMPLANT
BLADE SAGITTAL 25.0X1.27X90MM (BLADE) ×1
BNDG ESMARK 6X9 LF (GAUZE/BANDAGES/DRESSINGS) ×3
CEMENT HV SMART SET (Cement) ×6 IMPLANT
CLOTH BEACON ORANGE TIMEOUT ST (SAFETY) ×3 IMPLANT
COOLER CRYO CUFF IC AND MOTOR (MISCELLANEOUS) ×3 IMPLANT
COVER LIGHT HANDLE STERIS (MISCELLANEOUS) ×6 IMPLANT
COVER WAND RF STERILE (DRAPES) ×3 IMPLANT
CUFF CRYO KNEE18X23 MED (MISCELLANEOUS) ×3 IMPLANT
CUFF TOURN SGL QUICK 34 (TOURNIQUET CUFF) ×2
CUFF TRNQT CYL 34X4.125X (TOURNIQUET CUFF) ×1 IMPLANT
DRAPE BACK TABLE (DRAPES) ×3 IMPLANT
DRAPE EXTREMITY T 121X128X90 (DISPOSABLE) ×3 IMPLANT
DRSG MEPILEX BORDER 4X12 (GAUZE/BANDAGES/DRESSINGS) ×3 IMPLANT
DURAPREP 26ML APPLICATOR (WOUND CARE) ×6 IMPLANT
ELECT REM PT RETURN 9FT ADLT (ELECTROSURGICAL) ×3
ELECTRODE REM PT RTRN 9FT ADLT (ELECTROSURGICAL) ×1 IMPLANT
FEMUR CMTD LEFT SZ 5 (Knees) ×1 IMPLANT
FEMUR LEFT SZ 5 (Knees) ×3 IMPLANT
GLOVE BIO SURGEON STRL SZ7 (GLOVE) ×6 IMPLANT
GLOVE BIOGEL PI IND STRL 7.0 (GLOVE) ×4 IMPLANT
GLOVE BIOGEL PI INDICATOR 7.0 (GLOVE) ×8
GLOVE ECLIPSE 6.5 STRL STRAW (GLOVE) ×3 IMPLANT
GLOVE SKINSENSE NS SZ8.0 LF (GLOVE) ×4
GLOVE SKINSENSE STRL SZ8.0 LF (GLOVE) ×2 IMPLANT
GLOVE SS N UNI LF 8.5 STRL (GLOVE) ×3 IMPLANT
GOWN STRL REUS W/TWL LRG LVL3 (GOWN DISPOSABLE) ×9 IMPLANT
GOWN STRL REUS W/TWL XL LVL3 (GOWN DISPOSABLE) ×3 IMPLANT
HANDPIECE INTERPULSE COAX TIP (DISPOSABLE) ×2
HOOD W/PEELAWAY (MISCELLANEOUS) ×12 IMPLANT
INSERT CROSS LINK 12.5 SZ4 (Knees) ×3 IMPLANT
INST SET MAJOR BONE (KITS) ×3 IMPLANT
IV NS IRRIG 3000ML ARTHROMATIC (IV SOLUTION) ×3 IMPLANT
KIT BLADEGUARD II DBL (SET/KITS/TRAYS/PACK) ×3 IMPLANT
KIT TURNOVER KIT A (KITS) ×3 IMPLANT
MANIFOLD NEPTUNE II (INSTRUMENTS) ×3 IMPLANT
MARKER SKIN DUAL TIP RULER LAB (MISCELLANEOUS) ×3 IMPLANT
NEEDLE HYPO 18GX1.5 BLUNT FILL (NEEDLE) ×3 IMPLANT
NEEDLE HYPO 21X1.5 SAFETY (NEEDLE) ×3 IMPLANT
NS IRRIG 1000ML POUR BTL (IV SOLUTION) ×3 IMPLANT
PACK TOTAL JOINT (CUSTOM PROCEDURE TRAY) ×3 IMPLANT
PAD ARMBOARD 7.5X6 YLW CONV (MISCELLANEOUS) ×3 IMPLANT
PATELLA DOME PFC 38MM (Knees) ×3 IMPLANT
PILLOW KNEE EXTENSION 0 DEG (MISCELLANEOUS) ×3 IMPLANT
PIN THREADED HEADED SIGMA (PIN) ×3 IMPLANT
PIN TROCAR 3 INCH (PIN) ×3 IMPLANT
PIN/DRILL PACK ORTHO 1/8X3.0 (PIN) ×3 IMPLANT
SAW OSC TIP CART 19.5X105X1.3 (SAW) ×3 IMPLANT
SET BASIN LINEN APH (SET/KITS/TRAYS/PACK) ×3 IMPLANT
SET HNDPC FAN SPRY TIP SCT (DISPOSABLE) ×1 IMPLANT
STAPLER VISISTAT 35W (STAPLE) ×3 IMPLANT
SUT BRALON NAB BRD #1 30IN (SUTURE) ×3 IMPLANT
SUT MNCRL 0 VIOLET CTX 36 (SUTURE) ×1 IMPLANT
SUT MON AB 0 CT1 (SUTURE) ×3 IMPLANT
SUT MONOCRYL 0 CTX 36 (SUTURE) ×2
SYR 20ML LL LF (SYRINGE) ×6 IMPLANT
SYR BULB IRRIGATION 50ML (SYRINGE) ×3 IMPLANT
TOWEL OR 17X26 4PK STRL BLUE (TOWEL DISPOSABLE) ×3 IMPLANT
TOWER CARTRIDGE SMART MIX (DISPOSABLE) ×3 IMPLANT
TRAY FOLEY MTR SLVR 16FR STAT (SET/KITS/TRAYS/PACK) ×3 IMPLANT
TRAY TIBIAL MOD SZ 4.0 (Knees) ×3 IMPLANT
WATER STERILE IRR 1000ML POUR (IV SOLUTION) ×6 IMPLANT
YANKAUER SUCT 12FT TUBE ARGYLE (SUCTIONS) ×3 IMPLANT

## 2019-02-18 NOTE — Anesthesia Procedure Notes (Addendum)
Procedure Name: Intubation Date/Time: 02/18/2019 7:55 AM Performed by: Andree Elk, Amy A, CRNA Pre-anesthesia Checklist: Patient identified, Patient being monitored, Timeout performed, Emergency Drugs available and Suction available Patient Re-evaluated:Patient Re-evaluated prior to induction Oxygen Delivery Method: Circle system utilized Preoxygenation: Pre-oxygenation with 100% oxygen Induction Type: IV induction Laryngoscope Size: Mac and 3 Grade View: Grade II Tube type: Oral Tube size: 7.5 mm Number of attempts: 1 Airway Equipment and Method: Stylet Placement Confirmation: ETT inserted through vocal cords under direct vision,  positive ETCO2 and breath sounds checked- equal and bilateral Secured at: 21 cm Tube secured with: Tape Dental Injury: Teeth and Oropharynx as per pre-operative assessment

## 2019-02-18 NOTE — Op Note (Signed)
02/18/2019  9:53 AM  PATIENT:  Jamie [name]  48 y.o. male  PRE-OPERATIVE DIAGNOSIS:  left knee osteoarthritis  POST-OPERATIVE DIAGNOSIS:  left knee osteoarthritis  PROCEDURE:  Procedure(s): TOTAL KNEE ARTHROPLASTY (Left) DePuy Sigma fixed bearing posterior stabilized implant  Findings are listed in the operative note  Cuts 10 mm distal femoral cut 4 mm proximal tibial cut from the medial side 10 mm resected from the posterior aspect of the patella  DePuy Sigma posterior stabilized implant Femur size 5 Patella size 38 x 9 Tibia size 4 Polyethylene 12.5 PS  SURGEON:  Surgeon(s) and Role:    Carole Civil, MD - Primary  PHYSICIAN ASSISTANT:   ASSISTANTS: debbie dallas and ??    ANESTHESIA:   general and knee block saphenous   EBL:  50 mL   BLOOD ADMINISTERED:none  DRAINS: none   LOCAL MEDICATIONS USED:  OTHER exparel 20 full strength   SPECIMEN:  No Specimen  DISPOSITION OF SPECIMEN:  N/A  COUNTS:  YES  TOURNIQUET:   Total Tourniquet Time Documented: Thigh (Left) - 86 minutes Total: Thigh (Left) - 86 minutes   DICTATION: .Viviann Spare Dictation  PLAN OF CARE: Admit to inpatient   PATIENT DISPOSITION:  PACU - hemodynamically stable.   Delay start of Pharmacological VTE agent (>24hrs) due to surgical blood loss or risk of bleeding: no OPERATIVE FINDINGS:  Varus deformity no extension deficits 125 degrees of knee flexion preop, complete grade 4 total condylar wear medial femoral condyle large osteophytes, grade 4 tibial wear, large loose body in the suprapatellar area was removed.  Patella had grade 2 changes lateral joint had grade 1 changes with intact medial and lateral menisci   The patient was identified by 2 approved identification mechanisms. The operative extremity was evaluated and found to be acceptable for surgical treatment today. The chart was reviewed. The surgical site was confirmed and marked.  The patient was taken to the operating  room and given appropriate antibiotic Ancef. This is consistent with the SCIP protocol.  The patient was given the following anesthetic: Preop saphenous nerve block followed by general anesthesia  The patient was then placed supine on the operating table. A Foley catheter was inserted. The operative extremity was prepped and draped sterilely from the toes to the groin.  Timeout procedure was executed confirming the patient's name, surgical site, antibiotic administration, x-rays available, and implants available.  The operative limb,  was exsanguinated with a six-inch Esmarch and the tourniquet was inflated to 300 mmHg.  A straight midline incision was made over the left KNEE and taken down to the extensor mechanism. A medial arthrotomy was performed. The patella was everted and the patellofemoral band was released. The anterior cruciate ligament and PCL were resected.  The anterior horns of the lateral and medial meniscus were resected. The medial soft tissue sleeve was elevated to the mid coronal plane.  A three-eighths inch drill bit was used to enter the femoral canal which was decompressed with suction and irrigation until clear. The distal femoral cutting guide was set for 10 mm distal resection,  5valgus alignment, for a left knee. The distal femur was resected and checked for flatness.  The Depuy Sigma sizing femoral guide was placed and the femur was sized to a size 5 . A 4-in-1 cutting block was placed along with collateral ligament retractors and the distal femoral cuts were completed.  The external alignment guide for the tibial resection was then applied to the distal and proximal tibia  and set for anatomic slope along with 4 MM resection  from the medial deficient side.   Rotational alignment was set using the malleolus, the tibial tubercle and the tibial spines.  The proximal tibia was resected along with  residual menisci. The tibia was sized using a base plate to a size 4.    Spacer blocks were used to confirm equal flexion extension gaps with releases done as needed. A size 12.5 MM spacer block gave equal stability and flexion extension.  The correct sized notch cutting guide for the femur was then applied and the notch cut was made.  Trial reduction was completed using 5 femur 4 tibia 12.5 poly-trial implants. Patella tracking was normal  We then skeletonized the patella. It measured 24 mm in thickness and the patellar resection was set for 14 millimeters. the patellar resection was completed. The patella diameter measured 38. We then drilled the peg holes for the patella  The proximal tibia was prepared using the size 4 base plate.  Thorough irrigation was performed and the bone was dried and prepared for cement. The cement was mixed on the back table using third generation preparation techniques  20 cc of full-strength Exparel was injected into the soft tissues including the posterior capsule.  The implants were then cemented in place and excess cement was removed. The cement was allowed to cure. Irrigation was repeated and excess and residual bone fragments and cement were removed.   The extensor mechanism was closed with #1 Bralon suture followed by subcutaneous tissue closure using 0 Monocryl suture  Skin approximation was performed using staples  A sterile dressing was applied, followed by a ace wrapped from foot to thigh and then a Cryo/Cuff which was activated   The patient was taken recovery room in stable condition

## 2019-02-18 NOTE — Brief Op Note (Signed)
02/18/2019  9:48 AM  PATIENT:  Oretha Caprice  48 y.o. male  PRE-OPERATIVE DIAGNOSIS:  left knee osteoarthritis  POST-OPERATIVE DIAGNOSIS:  left knee osteoarthritis  PROCEDURE:  Procedure(s): TOTAL KNEE ARTHROPLASTY (Left)  SURGEON:  Surgeon(s) and Role:    Carole Civil, MD - Primary  PHYSICIAN ASSISTANT:   ASSISTANTS: debbie dallas and ??    ANESTHESIA:   general and knee block saphenous   EBL:  50 mL   BLOOD ADMINISTERED:none  DRAINS: none   LOCAL MEDICATIONS USED:  OTHER exparel 20 full strength   SPECIMEN:  No Specimen  DISPOSITION OF SPECIMEN:  N/A  COUNTS:  YES  TOURNIQUET:   Total Tourniquet Time Documented: Thigh (Left) - 86 minutes Total: Thigh (Left) - 86 minutes   DICTATION: .Viviann Spare Dictation  PLAN OF CARE: Admit to inpatient   PATIENT DISPOSITION:  PACU - hemodynamically stable.   Delay start of Pharmacological VTE agent (>24hrs) due to surgical blood loss or risk of bleeding: no

## 2019-02-18 NOTE — Anesthesia Procedure Notes (Signed)
Anesthesia Regional Block: Adductor canal block   Pre-Anesthetic Checklist: ,, timeout performed, Correct Patient, Correct Site, Correct Laterality, Correct Procedure, Correct Position, site marked, Risks and benefits discussed, at surgeon's request and post-op pain management  Laterality: Left  Prep: chloraprep       Needles:  Injection technique: Single-shot  Needle Type: Stimulator Needle - 40     Needle Length: 9cm  Needle Gauge: 20   Needle insertion depth: 6 cm   Additional Needles:   Procedures:,,,, ultrasound used (permanent image in chart),,,,  Narrative:  Start time: 02/18/2019 7:36 AM End time: 02/18/2019 7:44 AM Injection made incrementally with aspirations every 5 mL.  Performed by: Personally  Anesthesiologist: Lenice Llamas, MD  Additional Notes: BP cuff, EKG monitors applied. Sedation begun. After Q&A/Consent, Chloroprep   L Saphenous ID'd with USG, then 3-5 cc incremental injections of 0.5% Ropiv plain -NO POI Awake conversant throughout. 23cc total used , unable to print U/S photo  Sedation given was 100 ucg fent and 2 mg versed total, all given upfront

## 2019-02-18 NOTE — Transfer of Care (Signed)
Immediate Anesthesia Transfer of Care Note  Patient: Jamie [name]  Procedure(s) Performed: TOTAL KNEE ARTHROPLASTY (Left Knee)  Patient Location: PACU  Anesthesia Type:General  Level of Consciousness: awake, alert , oriented and patient cooperative  Airway & Oxygen Therapy: Patient Spontanous Breathing  Post-op Assessment: Report given to RN and Post -op Vital signs reviewed and stable  Post vital signs: Reviewed and stable  Last Vitals:  Vitals Value Taken Time  BP 183/82 02/18/19 1000  Temp    Pulse 85 02/18/19 1004  Resp 17 02/18/19 1004  SpO2 96 % 02/18/19 1004  Vitals shown include unvalidated device data.  Last Pain:  Vitals:   02/18/19 0639  TempSrc: Oral  PainSc: 3       Patients Stated Pain Goal: 7 ([id number])  Complications: No apparent anesthesia complications

## 2019-02-18 NOTE — Anesthesia Postprocedure Evaluation (Signed)
Anesthesia Post Note  Patient: Jamie [name]  Procedure(s) Performed: TOTAL KNEE ARTHROPLASTY (Left Knee)  Patient location during evaluation: PACU Anesthesia Type: Regional Level of consciousness: awake and alert, oriented and patient cooperative Pain management: pain level controlled Vital Signs Assessment: post-procedure vital signs reviewed and stable Respiratory status: spontaneous breathing Cardiovascular status: stable Postop Assessment: no apparent nausea or vomiting Anesthetic complications: no     Last Vitals:  Vitals:   02/18/19 1015 02/18/19 1030  BP: (!) 160/95 (!) 161/98  Pulse: 89 91  Resp: 20 20  Temp:    SpO2: 96% 95%    Last Pain:  Vitals:   02/18/19 1030  TempSrc:   PainSc: 5     LLE Motor Response: Purposeful movement (02/18/19 1030) LLE Sensation: Decreased;Pain (02/18/19 1030)          ADAMS, AMY A

## 2019-02-18 NOTE — Interval H&P Note (Signed)
History and Physical Interval Note:  02/18/2019 7:26 AM  Jamie [name]  has presented today for surgery, with the diagnosis of left knee osteoarthritis.  The various methods of treatment have been discussed with the patient and family. After consideration of risks, benefits and other options for treatment, the patient has consented to  Procedure(s): TOTAL KNEE ARTHROPLASTY (Left) as a surgical intervention.  The patient's history has been reviewed, patient examined, no change in status, stable for surgery.  I have reviewed the patient's chart and labs.  Questions were answered to the patient's satisfaction.     Arther Abbott

## 2019-02-18 NOTE — Anesthesia Preprocedure Evaluation (Addendum)
Anesthesia Evaluation  Patient identified by MRN, date of birth, ID band Patient awake    Reviewed: Allergy & Precautions, NPO status , Patient's Chart, lab work & pertinent test results  Airway Mallampati: I  TM Distance: >3 FB Neck ROM: Full    Dental no notable dental hx. (+) Edentulous Upper, Poor Dentition   Pulmonary neg pulmonary ROS, former smoker,    Pulmonary exam normal breath sounds clear to auscultation       Cardiovascular Exercise Tolerance: Good hypertension, Pt. on medications negative cardio ROS Normal cardiovascular examI Rhythm:Regular Rate:Normal  States ET limited by bad L knee  Denies any known cardiac issues  ECG pending    Neuro/Psych Anxiety  Neuromuscular disease negative psych ROS   GI/Hepatic GERD  Medicated and Controlled,(+) Hepatitis [place] [id number] Not certain how acquired  Denies IVDA when questioned    Endo/Other  negative endocrine ROS  Renal/GU negative Renal ROS  negative genitourinary   Musculoskeletal  (+) Arthritis , Osteoarthritis,    Abdominal   Peds negative pediatric ROS (+)  Hematology negative hematology ROS (+)   Anesthesia Other Findings   Reproductive/Obstetrics negative OB ROS                            Anesthesia Physical Anesthesia Plan  ASA: III  Anesthesia Plan: General and Regional   Post-op Pain Management:    Induction: Intravenous  PONV Risk Score and Plan: 2 and Midazolam, Ondansetron and Treatment may vary due to age or medical condition  Airway Management Planned: Oral ETT  Additional Equipment:   Intra-op Plan:   Post-operative Plan: Extubation in OR  Informed Consent: I have reviewed the patients History and Physical, chart, labs and discussed the procedure including the risks, benefits and alternatives for the proposed anesthesia with the patient or authorized representative who has  indicated his/her understanding and acceptance.     Dental advisory given  Plan Discussed with: CRNA  Anesthesia Plan Comments: (Plan Full PPE use Plan GETA D/W PT -WTP with same after Q&A  Also offered Preop L Saphenous N. Block for POPM -WTP with same after Q&A  requestes sedation )        Anesthesia Quick Evaluation

## 2019-02-18 NOTE — Brief Op Note (Signed)
02/18/2019  9:53 AM  PATIENT:  Jamie [name]  48 y.o. male  PRE-OPERATIVE DIAGNOSIS:  left knee osteoarthritis  POST-OPERATIVE DIAGNOSIS:  left knee osteoarthritis  PROCEDURE:  Procedure(s): TOTAL KNEE ARTHROPLASTY (Left)  SURGEON:  Surgeon(s) and Role:    Carole Civil, MD - Primary  PHYSICIAN ASSISTANT:   ASSISTANTS: debbie dallas and ??    ANESTHESIA:   general and knee block saphenous   EBL:  50 mL   BLOOD ADMINISTERED:none  DRAINS: none   LOCAL MEDICATIONS USED:  OTHER exparel 20 full strength   SPECIMEN:  No Specimen  DISPOSITION OF SPECIMEN:  N/A  COUNTS:  YES  TOURNIQUET:   Total Tourniquet Time Documented: Thigh (Left) - 86 minutes Total: Thigh (Left) - 86 minutes   DICTATION: .Viviann Spare Dictation  PLAN OF CARE: Admit to inpatient   PATIENT DISPOSITION:  PACU - hemodynamically stable.   Delay start of Pharmacological VTE agent (>24hrs) due to surgical blood loss or risk of bleeding: no

## 2019-02-18 NOTE — H&P (Signed)
TOTAL KNEE ADMISSION H&P  Patient is being admitted for left total knee arthroplasty.  Subjective:  Chief Complaint:left knee pain.  HPI: Jamie [name], 48 y.o. male, has a history of pain and functional disability in the left knee due to arthritis and has failed non-surgical conservative treatments for greater than 12 weeks to includeNSAID's and/or analgesics, corticosteriod injections, use of assistive devices and activity modification.  Onset of symptoms was gradual, starting several yrs years ago with gradually worsening course since that time.   Patient currently rates pain in the left knee(s) at 8 out of 10 with activity. Patient has night pain, worsening of pain with activity and weight bearing, pain that interferes with activities of daily living, pain with passive range of motion, crepitus and joint swelling.  Patient has evidence of subchondral cysts, subchondral sclerosis, periarticular osteophytes, joint subluxation and joint space narrowing by imaging studies.    Patient Active Problem List   Diagnosis Date Noted  . Effusion of knee joint, left 06/03/2018  . S/P left knee arthroscopy 12/07/16 04/13/2017  . Family history of colon cancer 02/28/2017  . Hepatic cirrhosis ([place]) 02/28/2017  . Derangement of posterior horn of medial meniscus of left knee   . Chondromalacia patellae, left knee   . Chondromalacia of medial femoral condyle, left   . Hepatitis C 06/15/2016  . Lumbar herniated disc 10/06/2013  . Carpal tunnel syndrome 07/28/2013   Past Medical History:  Diagnosis Date  . Anxiety   . Arthritis   . Chronic back pain   . Chronic knee pain   . GERD (gastroesophageal reflux disease)    occ  . HCV antibody positive   . Hypertension    "Dr Karie Kirks took me off meds"  diet control  . Lumbar radiculopathy   . Pre-diabetes     Past Surgical History:  Procedure Laterality Date  . BACK SURGERY    . CARPAL TUNNEL RELEASE Right 06/25/2013   Procedure: CARPAL TUNNEL  RELEASE;  Surgeon: Carole Civil, MD;  Location: [facility];  Service: Orthopedics;  Laterality: Right;  . CARPAL TUNNEL RELEASE Left 07/25/2013   Procedure: LEFT CARPAL TUNNEL RELEASE;  Surgeon: Carole Civil, MD;  Location: [facility];  Service: Orthopedics;  Laterality: Left;  . COLONOSCOPY WITH PROPOFOL N/A 04/05/2017   Procedure: COLONOSCOPY WITH PROPOFOL;  Surgeon: Daneil Dolin, MD;  Location: [facility];  Service: Endoscopy;  Laterality: N/A;  12:30pm-pt notified to arrive at 9:15am for 10:45am procedure per KF  . ELBOW SURGERY Left   . FOOT SURGERY Right   . HERNIA REPAIR Right    inguinal- age 34  . KNEE ARTHROSCOPY WITH MEDIAL MENISECTOMY Left 12/07/2016   Procedure: KNEE ARTHROSCOPY WITH MEDIAL MENISECTOMY;  Surgeon: Carole Civil, MD;  Location: [facility];  Service: Orthopedics;  Laterality: Left;  . KNEE SURGERY    . left elbow    . LUMBAR LAMINECTOMY/DECOMPRESSION MICRODISCECTOMY Left 10/06/2013   Procedure: Left Lumbar Three-four microdiskectomy;  Surgeon: Ophelia Charter, MD;  Location: [facility];  Service: Neurosurgery;  Laterality: Left;  Left Lumbar Three-four microdiskectomy  . right foot     forgein body removal  . right knee  orif right patella [place] 1993  . SEPTOPLASTY      Current Facility-Administered Medications  Medication Dose Route Frequency Provider Last Rate Last Dose  . ceFAZolin (ANCEF) IVPB 2g/100 mL premix  2 g Intravenous On Call to OR Carole Civil, MD      . chlorhexidine (  HIBICLENS) 4 % liquid 4 application  60 mL Topical Once Carole Civil, MD      . povidone-iodine 10 % swab 2 application  2 application Topical Once Carole Civil, MD      . tranexamic acid (CYKLOKAPRON) IVPB 1,000 mg  1,000 mg Intravenous To OR Carole Civil, MD       No Known Allergies  Social History   Tobacco Use  . Smoking status: Former Smoker    Packs/day: 1.00    Years: 15.00    Pack years: 15.00    Types: Cigarettes    Quit  date: 12/21/2012    Years since quitting: 6.1  . Smokeless tobacco: Former Systems developer    Types: [place] date: 12/05/1990  Substance Use Topics  . Alcohol use: No    Family History  Problem Relation Age of Onset  . Heart disease Other   . Arthritis Other   . Cancer Other   . Asthma Other   . Diabetes Other   . Kidney disease Other   . Colon cancer Father 70     Review of Systems  Musculoskeletal: Positive for joint pain.  All other systems reviewed and are negative.   Objective:  Physical Exam  Constitutional: He is oriented to person, place, and time. He appears well-developed and well-nourished.  HENT:  Head: Normocephalic and atraumatic.  Eyes: Pupils are equal, round, and reactive to light. Conjunctivae are normal.  Neck: Normal range of motion. Neck supple. No JVD present. No tracheal deviation present. No thyromegaly present.  Cardiovascular: Normal rate, regular rhythm, normal heart sounds and intact distal pulses.  Respiratory: Effort normal and breath sounds normal. No stridor. He has no wheezes. He has no rales. He exhibits no tenderness.  GI: Soft. Bowel sounds are normal. He exhibits no distension and no mass.  Musculoskeletal:     Right shoulder: Normal.     Left shoulder: Normal.     Right hip: Normal.     Left hip: Normal.     Left knee: He exhibits decreased range of motion, swelling, effusion and abnormal alignment. He exhibits no ecchymosis, no deformity, no laceration, no erythema, no LCL laxity, normal patellar mobility, no bony tenderness, normal meniscus and no MCL laxity. Tenderness found. Medial joint line tenderness noted. No lateral joint line, no MCL, no LCL and no patellar tendon tenderness noted.  Lymphadenopathy:    He has no cervical adenopathy.  Neurological: He is alert and oriented to person, place, and time. He has normal reflexes. He displays normal reflexes. He exhibits normal muscle tone. Coordination normal.  Skin: Skin is warm and dry.  No rash noted. No erythema. No pallor.  Psychiatric: He has a normal mood and affect. His behavior is normal. Judgment and thought content normal.    Vital signs in last 24 hours: Temp:  [97.9 F (36.6 C)] 97.9 F (36.6 C) (11/10 0639) Pulse Rate:  [47] 47 (11/10 0639) Resp:  [18] 18 (11/10 0639) BP: (123)/(88) 123/88 (11/10 0639) SpO2:  [99 %] 99 % (11/10 0639)  Labs:   Estimated body mass index is 33.19 kg/m as calculated from the following:   Height as of 02/14/19: 5\' 11"  (1.803 m).   Weight as of 02/14/19: 108 kg.   Imaging Review Plain radiographs demonstrate moderate degenerative joint disease of the left knee(s). The overall alignment ismild varus. The bone quality appears to be good for age and reported activity level.  Assessment/Plan:  End stage arthritis, left knee   The patient history, physical examination, clinical judgment of the provider and imaging studies are consistent with end stage degenerative joint disease of the left knee(s) and total knee arthroplasty is deemed medically necessary. The treatment options including medical management, injection therapy arthroscopy and arthroplasty were discussed at length. The risks and benefits of total knee arthroplasty were presented and reviewed. The risks due to aseptic loosening, infection, stiffness, patella tracking problems, thromboembolic complications and other imponderables were discussed. The patient acknowledged the explanation, agreed to proceed with the plan and consent was signed. Patient is being admitted for inpatient treatment for surgery, pain control, PT, OT, prophylactic antibiotics, VTE prophylaxis, progressive ambulation and ADL's and discharge planning. The patient is planning to be discharged home with home health services  LEFT TOTAL KNEE

## 2019-02-19 ENCOUNTER — Other Ambulatory Visit: Payer: Self-pay

## 2019-02-19 ENCOUNTER — Encounter (HOSPITAL_COMMUNITY): Payer: Self-pay | Admitting: Orthopedic Surgery

## 2019-02-19 DIAGNOSIS — M1712 Unilateral primary osteoarthritis, left knee: Secondary | ICD-10-CM | POA: Diagnosis not present

## 2019-02-19 LAB — BASIC METABOLIC PANEL
Anion gap: 10 (ref 5–15)
BUN: 16 mg/dL (ref 6–20)
CO2: 28 mmol/L (ref 22–32)
Calcium: 8.8 mg/dL — ABNORMAL LOW (ref 8.9–10.3)
Chloride: 100 mmol/L (ref 98–111)
Creatinine, Ser: 1.17 mg/dL (ref 0.61–1.24)
GFR calc Af Amer: >60 mL/min (ref >60)
GFR calc non Af Amer: >60 mL/min (ref >60)
Glucose, Bld: 104 mg/dL — ABNORMAL HIGH (ref 70–99)
Potassium: 3.8 mmol/L (ref 3.5–5.1)
Sodium: 138 mmol/L (ref 135–145)

## 2019-02-19 LAB — CBC
HCT: 37 % — ABNORMAL LOW (ref 39.0–52.0)
Hemoglobin: 12.7 g/dL — ABNORMAL LOW (ref 13.0–17.0)
MCH: 31.8 pg (ref 26.0–34.0)
MCHC: 34.3 g/dL (ref 30.0–36.0)
MCV: 92.7 fL (ref 80.0–100.0)
Platelets: 193 10*3/uL (ref 150–400)
RBC: 3.99 MIL/uL — ABNORMAL LOW (ref 4.22–5.81)
RDW: 12.9 % (ref 11.5–15.5)
WBC: 11.2 10*3/uL — ABNORMAL HIGH (ref 4.0–10.5)
nRBC: 0 % (ref 0.0–0.2)

## 2019-02-19 MED ORDER — APIXABAN 2.5 MG PO TABS: 2.5000 mg | ORAL_TABLET | Freq: Two times a day (BID) | ORAL | 0 refills | Status: DC

## 2019-02-19 MED ORDER — METHOCARBAMOL 500 MG PO TABS: 500.0000 mg | ORAL_TABLET | Freq: Four times a day (QID) | ORAL | 1 refills | Status: DC | PRN

## 2019-02-19 MED ORDER — OXYCODONE HCL 5 MG PO TABS: 5.0000 mg | ORAL_TABLET | ORAL | 0 refills | Status: DC | PRN

## 2019-02-19 MED ORDER — POLYETHYLENE GLYCOL 3350 17 G PO PACK: 17.0000 g | PACK | Freq: Every day | ORAL | 0 refills | Status: DC

## 2019-02-19 MED ORDER — GABAPENTIN 300 MG PO CAPS: 300.0000 mg | ORAL_CAPSULE | Freq: Three times a day (TID) | ORAL | 0 refills | Status: DC

## 2019-02-19 NOTE — Progress Notes (Signed)
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  Patient will benefit from continued physical therapy in hospital and recommended venue below to increase strength, balance, endurance for safe ADLs and gait.   Follow Up Recommendations  Home health PT;Supervision - Intermittent     Equipment Recommendations  None recommended by PT    Recommendations for Other Services       Precautions / Restrictions Precautions Precautions: Fall Restrictions Weight Bearing Restrictions: Yes LLE Weight Bearing: Weight bearing as tolerated    Mobility  Bed Mobility Overal bed mobility: Modified Independent             General bed mobility comments: demonstrates good return for using RLE to move LLE when getting back into bed  Transfers Overall transfer level: Modified independent Equipment used: Rolling walker (2 wheeled) Transfers: Sit to/from [facility] Sit to Stand: Modified independent (Device/Increase time) Stand pivot transfers: Modified independent (Device/Increase time)       General transfer comment: no los of balance  Ambulation/Gait   Gait Distance (Feet): 150 Feet Assistive device: Rolling walker (2 wheeled) Gait Pattern/deviations: Decreased step length - left;Decreased stance time - left;Decreased stride length;Antalgic Gait velocity:  decreased   General Gait Details: demonstrates increased endurance/distance for ambulation in PM with slightly labored cadence, fair/good return for left heel to toe stepping without loss of balance   Stairs Stairs: Yes Stairs assistance: Modified independent (Device/Increase time) Stair Management: Two rails;Step to pattern Number of Stairs: 9 General stair comments: demonstrates good return for going up/down stairs using bilateral siderails without loss of balance   Wheelchair Mobility    Modified Rankin (Stroke Patients Only)       Balance Overall balance assessment: Needs assistance Sitting-balance support: Feet supported;No upper extremity supported Sitting balance-Leahy Scale: Good Sitting balance - Comments: seated at EOB   Standing balance support: During functional activity;Bilateral upper extremity supported Standing balance-Leahy Scale: Fair Standing balance comment: fair/good using RW                            Cognition Arousal/Alertness: Awake/alert Behavior During Therapy: WFL for tasks assessed/performed Overall Cognitive Status: Within Functional Limits for tasks assessed                                        Exercises Other Exercises Other Exercises: self stretching left knee using RLE while seated at bedside x 2-3 minute holds x 2 trials    General Comments        Pertinent Vitals/Pain Pain Score: 7  Pain Location: left knee Pain Descriptors / Indicators: Sore Pain Intervention(s): Limited activity within patient's tolerance;Monitored during session;Patient requesting pain meds-RN notified    Home Living  Prior Function            PT Goals (current goals can now be found in the care plan section) Acute Rehab PT Goals Patient Stated Goal: return home with family to assist PT Goal Formulation: With patient Time For Goal Achievement: 02/21/19 Potential to Achieve Goals:  Good Progress towards PT goals: Progressing toward goals    Frequency    BID      PT Plan Current plan remains appropriate    Co-evaluation              AM-PAC PT "6 Clicks" Mobility   Outcome Measure  Help needed turning from your back to your side while in a flat bed without using bedrails?: None Help needed moving from lying on your back to sitting on the side of a flat bed without using bedrails?: None Help needed moving to and from a bed to a chair (including a wheelchair)?: None Help needed standing up from a chair using your arms (e.g., wheelchair or bedside chair)?: None Help needed to walk in hospital room?: A Little Help needed climbing 3-5 steps with a railing? : A Little 6 Click Score: 22    End of Session   Activity Tolerance: Patient tolerated treatment well;Patient limited by fatigue Patient left: in bed;in CPM;with call bell/phone within reach Nurse Communication: Mobility status PT Visit Diagnosis: Unsteadiness on feet (R26.81);Other abnormalities of gait and mobility (R26.89);Muscle weakness (generalized) (M62.81)     Time: [id number] PT Time Calculation (min) (ACUTE ONLY): 31 min  Charges:  $Gait Training: 8-22 mins $Therapeutic Activity: 8-22 mins                     3:39 PM, 02/19/19 Lonell Grandchild, MPT Physical Therapist with [facility] [phone] office [phone] mobile phone

## 2019-02-19 NOTE — Progress Notes (Signed)
   Subjective:    Patient ID: Jamie [name], male    DOB: [birth year], 48 y.o.   MRN: [id number]  48 yo left tka   He s tired       Review of Systems     Objective:   Physical Exam   Left leg looks good, no swelling and calf soft   BP 106/68 (BP Location: Left Arm)   Pulse (!) 55   Temp 98.5 F (36.9 C) (Oral)   Resp 18   SpO2 97%        Assessment & Plan:    CBC Latest Ref Rng & Units 02/19/2019 02/14/2019 03/21/2017  WBC 4.0 - 10.5 K/uL 11.2(H) 5.7 6.1  Hemoglobin 13.0 - 17.0 g/dL 12.7(L) 14.8 15.7  Hematocrit 39.0 - 52.0 % 37.0(L) 42.9 45.4  Platelets 150 - 400 K/uL 193 193 226    BMP Latest Ref Rng & Units 02/19/2019 02/14/2019 03/21/2017  Glucose 70 - 99 mg/dL 104(H) 85 105  BUN 6 - 20 mg/dL 16 22(H) 15  Creatinine 0.61 - 1.24 mg/dL 1.17 1.10 1.09  BUN/Creat Ratio 6 - 22 (calc) - - NOT APPLICABLE  Sodium A999333 - 145 mmol/L 138 138 138  Potassium 3.5 - 5.1 mmol/L 3.8 3.9 4.8  Chloride 98 - 111 mmol/L 100 101 104  CO2 22 - 32 mmol/L 28 29 27   Calcium 8.9 - 10.3 mg/dL 8.8(L) 9.1 9.3   Patient did not get therapy yesterday he was in the PACU  Recommend physical therapy reassess discharge at noon

## 2019-02-19 NOTE — Discharge Summary (Signed)
Physician Discharge Summary  Patient ID: Jamie [name] MRN: [id number] DOB/AGE: Jan 10, 1971 48 y.o.  Admit date: 02/18/2019 Discharge date: 02/19/2019  Admission Diagnoses: Osteoarthritis left knee  Discharge Diagnoses: Osteoarthritis left knee  Discharged Condition: stable  Procedure: Left total knee  [employer] foot fixed-bearing posterior stabilized 5 femur 4 tibia 12.5 polyethylene 38 x 9 patella  Hospital Course: Excellent  Surgery was done on November 10 with a block performed by anesthesia and then general anesthesia  November 11 the patient walked 125 feet at 80 degrees of knee flexion and was having good pain control and was discharged home  Dressing was changed scant drainage neurovascular exam intact patient awake alert and oriented.  Calf supple soft and Homans' sign negative.  CBC Latest Ref Rng & Units 02/19/2019 02/14/2019 03/21/2017  WBC 4.0 - 10.5 K/uL 11.2(H) 5.7 6.1  Hemoglobin 13.0 - 17.0 g/dL 12.7(L) 14.8 15.7  Hematocrit 39.0 - 52.0 % 37.0(L) 42.9 45.4  Platelets 150 - 400 K/uL 193 193 226   BMP Latest Ref Rng & Units 02/19/2019 02/14/2019 03/21/2017  Glucose 70 - 99 mg/dL 104(H) 85 105  BUN 6 - 20 mg/dL 16 22(H) 15  Creatinine 0.61 - 1.24 mg/dL 1.17 1.10 1.09  BUN/Creat Ratio 6 - 22 (calc) - - NOT APPLICABLE  Sodium A999333 - 145 mmol/L 138 138 138  Potassium 3.5 - 5.1 mmol/L 3.8 3.9 4.8  Chloride 98 - 111 mmol/L 100 101 104  CO2 22 - 32 mmol/L 28 29 27   Calcium 8.9 - 10.3 mg/dL 8.8(L) 9.1 9.3        Discharge Exam: BP 109/74 (BP Location: Left Arm)   Pulse 67   Temp 98.7 F (37.1 C) (Oral)   Resp 20   SpO2 95%  Physical Exam    Disposition: Discharge disposition: 01-Home or Self Care       Discharge Instructions    Call MD / Call 911   Complete by: As directed    If you experience chest pain or shortness of breath, CALL 911 and be transported to the hospital emergency room.  If you develope a fever above 101 F, pus (white  drainage) or increased drainage or redness at the wound, or calf pain, call your surgeon's office.   Constipation Prevention   Complete by: As directed    Drink plenty of fluids.  Prune juice may be helpful.  You may use a stool softener, such as Colace (over the counter) 100 mg twice a day.  Use MiraLax (over the counter) for constipation as needed.   Diet - low sodium heart healthy   Complete by: As directed    Discharge instructions   Complete by: As directed    Use the Cryo/Cuff for 30 minutes 6 times a day  Use the blue heel foam pillow 3 times a day for 30 minutes to work on extending the knee  Do the exercises as instructed by physical therapy  Usual walker  CPM for 2 weeks started 0 to 80 degrees increase 10 degrees/day until you get to 110 degrees after 2 weeks you can return the machine by phone call  You do not have to change the dressing until Monday.  Use tape and gauze to cover the wound after Monday  Do not shower until the staples come out  Wear the TED hose both legs for 2 weeks to control clotting   Driving restrictions   Complete by: As directed    No driving for 2 weeks  Increase activity slowly as tolerated   Complete by: As directed      Allergies as of 02/19/2019   No Known Allergies     Medication List    STOP taking these medications   HYDROcodone-acetaminophen 10-325 MG tablet Commonly known as: NORCO     TAKE these medications   apixaban 2.5 MG Tabs tablet Commonly known as: ELIQUIS Take 1 tablet (2.5 mg total) by mouth every 12 (twelve) hours.   cloNIDine 0.1 MG tablet Commonly known as: CATAPRES Take 0.1 mg by mouth 3 (three) times daily.   diazepam 10 MG tablet Commonly known as: VALIUM Take 10 mg by mouth 2 (two) times daily.   gabapentin 300 MG capsule Commonly known as: NEURONTIN Take 1 capsule (300 mg total) by mouth 3 (three) times daily.   hydrochlorothiazide 25 MG tablet Commonly known as: HYDRODIURIL Take 25 mg by  mouth daily.   methocarbamol 500 MG tablet Commonly known as: ROBAXIN Take 1 tablet (500 mg total) by mouth every 6 (six) hours as needed for muscle spasms.   oxyCODONE 5 MG immediate release tablet Commonly known as: Oxy IR/ROXICODONE Take 1-2 tablets (5-10 mg total) by mouth every 4 (four) hours as needed for moderate pain (pain score 4-6).   pantoprazole 40 MG tablet Commonly known as: PROTONIX Take 40 mg by mouth daily.   polyethylene glycol 17 g packet Commonly known as: MIRALAX / GLYCOLAX Take 17 g by mouth daily. Start taking on: February 20, 2019   zolpidem 10 MG tablet Commonly known as: AMBIEN Take 10 mg by mouth at bedtime.      Follow-up Information    Home, [facility] Follow up.   Specialty: [place] Why: home health PT Contact information: [place] [address] [phone]           Signed: Arther Abbott 02/19/2019, 1:18 PM

## 2019-02-19 NOTE — Plan of Care (Signed)
  Problem: Acute Rehab PT Goals(only PT should resolve) Goal: Pt Will Go Supine/Side To Sit Outcome: Progressing Flowsheets (Taken 02/19/2019 0856) Pt will go Supine/Side to Sit: Independently Goal: Patient Will Transfer Sit To/From Stand Outcome: Progressing Flowsheets (Taken 02/19/2019 0856) Patient will transfer sit to/from stand: with modified independence Goal: Pt Will Transfer Bed To Chair/Chair To Bed Outcome: Progressing Flowsheets (Taken 02/19/2019 0856) Pt will Transfer Bed to Chair/Chair to Bed: with modified independence Goal: Pt Will Ambulate Outcome: Progressing Flowsheets (Taken 02/19/2019 0856) Pt will Ambulate:  > 125 feet  with modified independence  with rolling walker   8:57 AM, 02/19/19 Lonell Grandchild, MPT Physical Therapist with [facility] [phone] office [phone] mobile phone

## 2019-02-19 NOTE — Evaluation (Signed)
Physical Therapy Evaluation Patient Details Name: Jamie [name] MRN: [id number] DOB: December 19, 1970 Today's Date: 02/19/2019  LEFT KNEE ROM: 3 - 88 degrees AMBULATION DISTANCE: 120 feet using RW with Supervision    History of Present Illness  Jamie [name] is a 48 y/o  male, s/p Left TKA 02/18/19 with the diagnosis of left knee osteoarthritis  Clinical Impression  Patient instructed in HEP with good return demonstrated, limited for left knee end range extension/flexion due to increased pain, demonstrates fair/good endurance/distance for gait training without loss of balance, limited mostly due to increasing left knee pain/fatigue and tolerated sitting up in chair with LLE dangling after therapy.  Patient will benefit from continued physical therapy in hospital and recommended venue below to increase strength, balance, endurance for safe ADLs and gait.    Follow Up Recommendations Home health PT;Supervision - Intermittent    Equipment Recommendations  None recommended by PT    Recommendations for Other Services       Precautions / Restrictions Precautions Precautions: Fall Restrictions Weight Bearing Restrictions: Yes LLE Weight Bearing: Weight bearing as tolerated      Mobility  Bed Mobility Overal bed mobility: Modified Independent             General bed mobility comments: increased time, slightly labored movement  Transfers Overall transfer level: Needs assistance Equipment used: Rolling walker (2 wheeled) Transfers: Sit to/from [employer] Transfers Sit to Stand: Supervision Stand pivot transfers: Supervision;Min guard       General transfer comment: verbal cues for proper hand/LLE placement during stand to sits with fair/good carryover  Ambulation/Gait Ambulation/Gait assistance: Supervision Gait Distance (Feet): 120 Feet Assistive device: Rolling walker (2 wheeled) Gait Pattern/deviations: Decreased step length - left;Decreased stance time -  left;Decreased stride length;Antalgic Gait velocity: decreased   General Gait Details: slightly labored cadence with fair return for left heel to toe stepping, no loss of balance, limited secondary to increasing left knee pain and fatigue  Stairs            Wheelchair Mobility    Modified Rankin (Stroke Patients Only)       Balance Overall balance assessment: Needs assistance Sitting-balance support: Feet supported;No upper extremity supported Sitting balance-Leahy Scale: Good Sitting balance - Comments: seated at EOB   Standing balance support: During functional activity;Bilateral upper extremity supported Standing balance-Leahy Scale: Fair Standing balance comment: fair/good using RW                             Pertinent Vitals/Pain Pain Assessment: 0-10 Pain Score: 7  Pain Location: 6-7/10 left knee Pain Descriptors / Indicators: Sore Pain Intervention(s): Limited activity within patient's tolerance;Monitored during session    Home Living Family/patient expects to be discharged to:: Private residence Living Arrangements: Alone Available Help at Discharge: Family;Available PRN/intermittently Type of Home: Mobile home Home Access: Stairs to enter Entrance Stairs-Rails: Right;Left;Can reach both Entrance Stairs-Number of Steps: 3 in front with no siderails, 4 in back with bilateral siderails, can reach both Home Layout: One level Home Equipment: Walker - 2 wheels;Cane - single point;Bedside commode;Walker - standard      Prior Function Level of Independence: Independent with assistive device(s)         Comments: Community ambulator using SPC, does not drive     Hand Dominance   Dominant Hand: Right    Extremity/Trunk Assessment   Upper Extremity Assessment Upper Extremity Assessment: Overall WFL for tasks assessed  Lower Extremity Assessment Lower Extremity Assessment: Overall WFL for tasks assessed;LLE deficits/detail LLE Deficits /  Details: grossly 4/5 LLE: Unable to fully assess due to pain LLE Sensation: WNL LLE Coordination: WNL    Cervical / Trunk Assessment Cervical / Trunk Assessment: Normal  Communication   Communication: No difficulties  Cognition Arousal/Alertness: Awake/alert Behavior During Therapy: WFL for tasks assessed/performed Overall Cognitive Status: Within Functional Limits for tasks assessed                                        General Comments      Exercises Total Joint Exercises Ankle Circles/Pumps: Supine;10 reps;Left;AROM;Strengthening Quad Sets: Supine;10 reps;Left;Strengthening;AROM Short Arc Quad: Supine;Left;Strengthening;AAROM;5 reps Heel Slides: Supine;10 reps;Left;Strengthening;AROM Goniometric ROM: left knee: 3-88 degrees   Assessment/Plan    PT Assessment Patient needs continued PT services  PT Problem List Decreased strength;Decreased range of motion;Decreased activity tolerance;Decreased balance;Decreased mobility       PT Treatment Interventions Gait training;Stair training;Functional mobility training;Therapeutic activities;Therapeutic exercise;Patient/family education    PT Goals (Current goals can be found in the Care Plan section)  Acute Rehab PT Goals Patient Stated Goal: return home with family to assist PT Goal Formulation: With patient Time For Goal Achievement: 02/21/19 Potential to Achieve Goals: Good    Frequency BID   Barriers to discharge        Co-evaluation               AM-PAC PT "6 Clicks" Mobility  Outcome Measure Help needed turning from your back to your side while in a flat bed without using bedrails?: None Help needed moving from lying on your back to sitting on the side of a flat bed without using bedrails?: None Help needed moving to and from a bed to a chair (including a wheelchair)?: A Little Help needed standing up from a chair using your arms (e.g., wheelchair or bedside chair)?: A Little Help needed  to walk in hospital room?: A Little Help needed climbing 3-5 steps with a railing? : A Lot 6 Click Score: 19    End of Session   Activity Tolerance: Patient tolerated treatment well;Patient limited by fatigue Patient left: in chair;with call bell/phone within reach Nurse Communication: Mobility status PT Visit Diagnosis: Unsteadiness on feet (R26.81);Other abnormalities of gait and mobility (R26.89);Muscle weakness (generalized) (M62.81)    Time: [id number] PT Time Calculation (min) (ACUTE ONLY): 34 min   Charges:   PT Evaluation $PT Eval Moderate Complexity: 1 Mod PT Treatments $Gait Training: 8-22 mins $Therapeutic Exercise: 8-22 mins        8:55 AM, 02/19/19 Lonell Grandchild, MPT Physical Therapist with [facility] [phone] office [phone] mobile phone

## 2019-02-19 NOTE — Plan of Care (Signed)

## 2019-02-19 NOTE — Plan of Care (Signed)
Problem: Education: Goal: Knowledge of the prescribed therapeutic regimen will improve 02/19/2019 1551 by Melony Overly, RN Outcome: Adequate for Discharge 02/19/2019 1149 by Melony Overly, RN Outcome: Progressing Goal: Individualized Educational Video(s) 02/19/2019 1551 by Melony Overly, RN Outcome: Adequate for Discharge 02/19/2019 1149 by Melony Overly, RN Outcome: Progressing   Problem: Activity: Goal: Ability to avoid complications of mobility impairment will improve 02/19/2019 1551 by Melony Overly, RN Outcome: Adequate for Discharge 02/19/2019 1149 by Melony Overly, RN Outcome: Progressing Goal: Range of joint motion will improve 02/19/2019 1551 by Melony Overly, RN Outcome: Adequate for Discharge 02/19/2019 1149 by Melony Overly, RN Outcome: Progressing   Problem: Clinical Measurements: Goal: Postoperative complications will be avoided or minimized 02/19/2019 1551 by Melony Overly, RN Outcome: Adequate for Discharge 02/19/2019 1149 by Melony Overly, RN Outcome: Progressing   Problem: Pain Management: Goal: Pain level will decrease with appropriate interventions 02/19/2019 1551 by Melony Overly, RN Outcome: Adequate for Discharge 02/19/2019 1149 by Melony Overly, RN Outcome: Progressing   Problem: Skin Integrity: Goal: Will show signs of wound healing 02/19/2019 1551 by Melony Overly, RN Outcome: Adequate for Discharge 02/19/2019 1149 by Melony Overly, RN Outcome: Progressing   Problem: Education: Goal: Knowledge of General Education information will improve Description: Including pain rating scale, medication(s)/side effects and non-pharmacologic comfort measures 02/19/2019 1551 by Melony Overly, RN Outcome: Adequate for Discharge 02/19/2019 1149 by Melony Overly, RN Outcome: Progressing   Problem: Health Behavior/Discharge Planning: Goal: Ability to manage health-related needs will improve 02/19/2019 1551 by Melony Overly, RN Outcome: Adequate for Discharge 02/19/2019 1149 by Melony Overly, RN Outcome: Progressing   Problem: Clinical Measurements: Goal: Ability to maintain clinical measurements within normal limits will improve 02/19/2019 1551 by Melony Overly, RN Outcome: Adequate for Discharge 02/19/2019 1149 by Melony Overly, RN Outcome: Progressing Goal: Will remain free from infection 02/19/2019 1551 by Melony Overly, RN Outcome: Adequate for Discharge 02/19/2019 1149 by Melony Overly, RN Outcome: Progressing Goal: Diagnostic test results will improve 02/19/2019 1551 by Melony Overly, RN Outcome: Adequate for Discharge 02/19/2019 1149 by Melony Overly, RN Outcome: Progressing Goal: Respiratory complications will improve 02/19/2019 1551 by Melony Overly, RN Outcome: Adequate for Discharge 02/19/2019 1149 by Melony Overly, RN Outcome: Progressing Goal: Cardiovascular complication will be avoided 02/19/2019 1551 by Melony Overly, RN Outcome: Adequate for Discharge 02/19/2019 1149 by Melony Overly, RN Outcome: Progressing   Problem: Activity: Goal: Risk for activity intolerance will decrease 02/19/2019 1551 by Melony Overly, RN Outcome: Adequate for Discharge 02/19/2019 1149 by Melony Overly, RN Outcome: Progressing   Problem: Nutrition: Goal: Adequate nutrition will be maintained 02/19/2019 1551 by Melony Overly, RN Outcome: Adequate for Discharge 02/19/2019 1149 by Melony Overly, RN Outcome: Progressing   Problem: Coping: Goal: Level of anxiety will decrease 02/19/2019 1551 by Melony Overly, RN Outcome: Adequate for Discharge 02/19/2019 1149 by Melony Overly, RN Outcome: Progressing   Problem: Elimination: Goal: Will not experience complications related to bowel motility 02/19/2019 1551 by Melony Overly, RN Outcome: Adequate for Discharge 02/19/2019 1149 by Melony Overly, RN Outcome: Progressing Goal: Will not experience  complications related to urinary retention 02/19/2019 1551 by Melony Overly, RN Outcome: Adequate for Discharge 02/19/2019 1149 by Melony Overly, RN Outcome: Progressing   Problem: Pain Managment: Goal: General experience of comfort will improve 02/19/2019 1551 by  Melony Overly, RN Outcome: Adequate for Discharge 02/19/2019 1149 by Melony Overly, RN Outcome: Progressing   Problem: Safety: Goal: Ability to remain free from injury will improve 02/19/2019 1551 by Melony Overly, RN Outcome: Adequate for Discharge 02/19/2019 1149 by Melony Overly, RN Outcome: Progressing   Problem: Skin Integrity: Goal: Risk for impaired skin integrity will decrease 02/19/2019 1551 by Melony Overly, RN Outcome: Adequate for Discharge 02/19/2019 1149 by Melony Overly, RN Outcome: Progressing

## 2019-02-19 NOTE — TOC Transition Note (Signed)
Transition of Care [facility]) - CM/SW Discharge Note   Patient Details  Name: MATAIO DILELLO MRN: [id number] Date of Birth: 1971/03/06  Transition of Care [facility]) CM/SW Contact:  Shanea Karney, Chauncey Reading, RN Phone Number: 02/19/2019, 11:27 AM   Clinical Narrative:   Anticipate DC this afternoon. Home health previously arranged with [facility]. Discussed with patient and his sister per his request. Patient has all necessary DME at home. He lives alone, but sister's husband will be checking in on patient regularly.   Expected Discharge Plan: [place] Barriers to Discharge: Barriers Resolved    Expected Discharge Plan and Services Expected Discharge Plan: [place]   Discharge Planning Services: CM Consult Post Acute Care Choice: [place] arrangements for the past 2 months: [place]: PT [place]: [facility] (formerly [employer]) Date [place]: 02/19/19 Time [place]: 1127 Representative spoke with at [place]: Tim  Prior Living Arrangements/Services Living arrangements for the past 2 months: [place] Lives with:: Self   Do you feel safe going back to the place where you live?: Yes      Need for Family Participation in Patient Care: Yes (Comment) Care giver support system in place?: Yes (comment) Current home services: DME Criminal Activity/Legal Involvement Pertinent to Current Situation/Hospitalization: No - Comment as needed  Activities of Daily Living Home Assistive Devices/Equipment: Cane (specify quad or straight) ADL Screening (condition at time of admission) Patient's cognitive ability adequate to safely complete daily activities?: Yes Is the patient deaf or have difficulty hearing?: No Does the patient have difficulty seeing, even when wearing glasses/contacts?: No Does the patient have difficulty concentrating,  remembering, or making decisions?: No Patient able to express need for assistance with ADLs?: Yes Does the patient have difficulty dressing or bathing?: No Independently performs ADLs?: Yes (appropriate for developmental age) Does the patient have difficulty walking or climbing stairs?: Yes Weakness of Legs: Left Weakness of Arms/Hands: Left    Attitude/Demeanor/Rapport: Engaged Affect (typically observed): Accepting Orientation: : Oriented to Self, Oriented to Place, Oriented to  Time      Admission diagnosis:  left knee osteoarthritis Patient Active Problem List   Diagnosis Date Noted  . Osteoarthritis of left knee 02/18/2019  . Effusion of knee joint, left 06/03/2018  . S/P left knee arthroscopy 12/07/16 04/13/2017  . Family history of colon cancer 02/28/2017  . Hepatic cirrhosis ([place]) 02/28/2017  . Derangement of posterior horn of medial meniscus of left knee   . Primary osteoarthritis of left knee   . Chondromalacia of medial femoral condyle, left   . Hepatitis C 06/15/2016  . Lumbar herniated disc 10/06/2013  . Carpal tunnel syndrome 07/28/2013   PCP:  Lemmie Evens, MD Pharmacy:     Final next level of care: [place] [facility] Barriers to Discharge: Barriers Resolved

## 2019-02-20 ENCOUNTER — Telehealth: Payer: Self-pay | Admitting: Orthopedic Surgery

## 2019-02-20 LAB — TYPE AND SCREEN
ABO/RH(D): O POS
Antibody Screen: NEGATIVE
Unit division: 0
Unit division: 0

## 2019-02-20 LAB — BPAM RBC
Blood Product Expiration Date: 202012022359
Blood Product Expiration Date: 202012092359
Unit Type and Rh: 5100
Unit Type and Rh: 5100

## 2019-02-20 NOTE — Telephone Encounter (Signed)
Call received from [facility] [place] [facility]; states patient was discharged from hospital yesterday, 02/19/19, post total knee replacement surgery and that they are not able to do his initial home therapy visit until Saturday, 02/22/19. Please advise. Her direct # is [phone].

## 2019-02-20 NOTE — Telephone Encounter (Signed)
I called to advise her that will be okay, as long as they do go on Saturday. She states they will.

## 2019-02-21 ENCOUNTER — Encounter (HOSPITAL_COMMUNITY): Payer: Self-pay | Admitting: Orthopedic Surgery

## 2019-02-24 ENCOUNTER — Telehealth: Payer: Self-pay | Admitting: Orthopedic Surgery

## 2019-02-24 NOTE — Telephone Encounter (Signed)
I called to give verbal orders for therapy/ asked for frequency of visits, asked her to call back to let us know.

## 2019-02-24 NOTE — Telephone Encounter (Signed)
Maria from [facility] needs PT orders for this patient.  Please call her at   [phone]  Thanks

## 2019-02-25 ENCOUNTER — Telehealth: Payer: Self-pay | Admitting: Orthopedic Surgery

## 2019-02-25 ENCOUNTER — Other Ambulatory Visit: Payer: Self-pay | Admitting: Orthopedic Surgery

## 2019-02-25 MED ORDER — OXYCODONE HCL 5 MG PO TABS: 5.0000 mg | ORAL_TABLET | ORAL | 0 refills | Status: DC | PRN

## 2019-02-25 NOTE — Telephone Encounter (Signed)
Jamie [name] called this morning very upset and crying.  He stated he was out of pain medication and that his knee is swollen and he is in a lot of pain.  He also stated that home health came out on Friday but they did not come back on Monday and change his bandage as they told him they would.  He wants a refill on Oxycodone 5 mgs. Sent to [facility] on [address].

## 2019-02-26 ENCOUNTER — Telehealth: Payer: Self-pay | Admitting: Orthopedic Surgery

## 2019-02-26 NOTE — Telephone Encounter (Signed)
Patient aware, refill done 02/25/19.

## 2019-02-26 NOTE — Telephone Encounter (Signed)
Patient relays that [place] [facility] will be out today for his home visit and to change bandage.

## 2019-03-03 ENCOUNTER — Telehealth: Payer: Self-pay | Admitting: Orthopedic Surgery

## 2019-03-03 NOTE — Telephone Encounter (Signed)
Done

## 2019-03-03 NOTE — Telephone Encounter (Signed)
Call received from Clare Gandy, physical therapy assistant with [facility] [phone], called to relay that he saw patient Friday, 02/28/19, and today, 03/03/19. States patient was apprehensive at first with extension but he is now 0 to 70 degrees. States using his CPM and using straps, and aware of importance of working with bending the knee; states his pain is decreasing. Aware of scheduled appointment 03/05/19.

## 2019-03-04 DIAGNOSIS — Z96652 Presence of left artificial knee joint: Secondary | ICD-10-CM | POA: Insufficient documentation

## 2019-03-04 NOTE — Telephone Encounter (Signed)
Physical therapist called back today from [place] - ph#[phone]. States "forgot to let us know" - patient had a fall on Sunday, 03/02/19; said 'fell straight back, but did not get hurt.'

## 2019-03-04 NOTE — Telephone Encounter (Signed)
I spoke to therapy, they state he fell, but no injury to knee Making progress, but slow, hesitant with therapy. ROM 0-70, he is working hard now.

## 2019-03-04 NOTE — Telephone Encounter (Signed)
ok 

## 2019-03-05 ENCOUNTER — Other Ambulatory Visit: Payer: Self-pay

## 2019-03-05 ENCOUNTER — Other Ambulatory Visit: Payer: Self-pay | Admitting: Radiology

## 2019-03-05 ENCOUNTER — Telehealth: Payer: Self-pay | Admitting: Orthopedic Surgery

## 2019-03-05 ENCOUNTER — Encounter: Payer: Self-pay | Admitting: Orthopedic Surgery

## 2019-03-05 ENCOUNTER — Ambulatory Visit (INDEPENDENT_AMBULATORY_CARE_PROVIDER_SITE_OTHER): Payer: Medicaid Other | Admitting: Orthopedic Surgery

## 2019-03-05 DIAGNOSIS — Z96652 Presence of left artificial knee joint: Secondary | ICD-10-CM

## 2019-03-05 MED ORDER — GABAPENTIN 300 MG PO CAPS: 300.0000 mg | ORAL_CAPSULE | Freq: Three times a day (TID) | ORAL | 0 refills | Status: DC

## 2019-03-05 MED ORDER — OXYCODONE HCL 5 MG PO CAPS: 5.0000 mg | ORAL_CAPSULE | ORAL | 0 refills | Status: DC | PRN

## 2019-03-05 MED ORDER — METHOCARBAMOL 500 MG PO TABS: 500.0000 mg | ORAL_TABLET | Freq: Four times a day (QID) | ORAL | 1 refills | Status: DC | PRN

## 2019-03-05 MED ORDER — OXYCODONE HCL 5 MG PO TABS: 5.0000 mg | ORAL_TABLET | ORAL | 0 refills | Status: DC | PRN

## 2019-03-05 NOTE — Progress Notes (Signed)
Chief Complaint  Patient presents with  . Routine Post Op    02/18/2019 knee replacement left     Postop visit #1 left total knee postop day 15  Implant [employer] fixed bearing posterior stabilized 5 femur 4 tibia 12.5 polythirty 8 x 9 patella  Discharge walked 125 feet 80 degrees of knee flexion  Pain was controlled with gabapentin 300 mg oxycodone 5 mg 1 to 2 tablets every 4 hours  DVT prevention Eliquis 2.5 mg twice daily  Hemoglobin at discharge 12.7  Staples were removed from the skin the wound looks great however he only has about 45 degrees of knee flexion where he left the hospital with 80  Have emphasized that he has to move his knee that I can do it for him his extension was really good  He can remove his Ted stockings continue his Eliquis  Return in 4 weeks  Added gabapentin and decrease his oxycodone  Encounter Diagnosis  Name Primary?  . S/P total knee replacement, left 02/18/2019 Yes   Meds ordered this encounter  Medications  . oxycodone (OXY-IR) 5 MG capsule    Sig: Take 1 capsule (5 mg total) by mouth every 4 (four) hours as needed for up to 7 days.    Dispense:  42 capsule    Refill:  0  . gabapentin (NEURONTIN) 300 MG capsule    Sig: Take 1 capsule (300 mg total) by mouth 3 (three) times daily.    Dispense:  30 capsule    Refill:  0  . methocarbamol (ROBAXIN) 500 MG tablet    Sig: Take 1 tablet (500 mg total) by mouth every 6 (six) hours as needed for muscle spasms.    Dispense:  60 tablet    Refill:  1

## 2019-03-05 NOTE — Addendum Note (Signed)
Addended byCandice Camp on: 03/05/2019 09:36 AM   Modules accepted: Orders

## 2019-03-05 NOTE — Patient Instructions (Signed)
For pain take   oxicodone 5 mg  Gabapentin 300 mg Robaxin 500mg     Work hard on bending the knee  The oxicodone will be stopped after this prescription   Start therapy outpatient

## 2019-03-05 NOTE — Telephone Encounter (Signed)
[facility] called they did not get the Rxs on Jamie [name] can you resend?

## 2019-03-05 NOTE — Telephone Encounter (Signed)
I spoke to Jamie [name] directly in regards to Dr Aline Brochure to resend these meds for the patient.  She will have him resend them

## 2019-03-05 NOTE — Addendum Note (Signed)
Addended by: Carole Civil on: 03/05/2019 12:26 PM   Modules accepted: Orders

## 2019-03-10 ENCOUNTER — Ambulatory Visit (INDEPENDENT_AMBULATORY_CARE_PROVIDER_SITE_OTHER): Payer: Medicaid Other | Admitting: Orthopedic Surgery

## 2019-03-10 ENCOUNTER — Encounter: Payer: Self-pay | Admitting: Orthopedic Surgery

## 2019-03-10 ENCOUNTER — Other Ambulatory Visit: Payer: Self-pay

## 2019-03-10 DIAGNOSIS — Z96652 Presence of left artificial knee joint: Secondary | ICD-10-CM

## 2019-03-10 MED ORDER — OXYCODONE HCL 5 MG PO TABS: 5.0000 mg | ORAL_TABLET | ORAL | 0 refills | Status: DC | PRN

## 2019-03-10 NOTE — Progress Notes (Signed)
Single retained staple removed from mid incision s/p total knee replacement on 02/18/2019 patient tolerated well   Asking for refill on Oxycodone 5mg 

## 2019-03-13 ENCOUNTER — Encounter (HOSPITAL_COMMUNITY): Payer: Self-pay | Admitting: Physical Therapy

## 2019-03-13 ENCOUNTER — Ambulatory Visit (HOSPITAL_COMMUNITY): Payer: Medicaid Other | Attending: Orthopedic Surgery | Admitting: Physical Therapy

## 2019-03-13 ENCOUNTER — Other Ambulatory Visit: Payer: Self-pay

## 2019-03-13 DIAGNOSIS — R2689 Other abnormalities of gait and mobility: Secondary | ICD-10-CM | POA: Insufficient documentation

## 2019-03-13 DIAGNOSIS — M25562 Pain in left knee: Secondary | ICD-10-CM | POA: Diagnosis present

## 2019-03-13 DIAGNOSIS — M25662 Stiffness of left knee, not elsewhere classified: Secondary | ICD-10-CM | POA: Insufficient documentation

## 2019-03-13 DIAGNOSIS — M6281 Muscle weakness (generalized): Secondary | ICD-10-CM | POA: Insufficient documentation

## 2019-03-13 NOTE — Therapy (Signed)
[place], [place], [address] Phone: [phone]   Fax:  [phone]  Physical Therapy Evaluation  Patient Details  Name: Jamie [name] MRN: [id number] Date of Birth: [birth year] Referring Provider (PT): Arther Abbott MD    Encounter Date: 03/13/2019  PT End of Session - 03/13/19 1435    Visit Number  1    Number of Visits  13    Date for PT Re-Evaluation  04/24/19    Authorization Type  Medicaid [place]    Authorization Time Period  03/13/19-04/24/19 (submitted for 3 additional visits on 03/13/19)    Authorization - Visit Number  1    Authorization - Number of Visits  4    PT Start Time  [id number]    PT Stop Time  1510    PT Time Calculation (min)  45 min    Activity Tolerance  Patient tolerated treatment well    Behavior During Therapy  [facility] for tasks assessed/performed       Past Medical History:  Diagnosis Date  . Anxiety   . Arthritis   . Chronic back pain   . Chronic knee pain   . GERD (gastroesophageal reflux disease)    occ  . HCV antibody positive   . Hypertension    "Dr Karie Kirks took me off meds"  diet control  . Lumbar radiculopathy   . Pre-diabetes     Past Surgical History:  Procedure Laterality Date  . BACK SURGERY    . CARPAL TUNNEL RELEASE Right 06/25/2013   Procedure: CARPAL TUNNEL RELEASE;  Surgeon: Carole Civil, MD;  Location: [facility] OR[facility];  Service: Orthopedics;  Laterality: Right;  . CARPAL TUNNEL RELEASE Left 07/25/2013   Procedure: LEFT CARPAL TUNNEL RELEASE;  Surgeon: Carole Civil, MD;  Location: [facility];  Service: Orthopedics;  Laterality: Left;  . COLONOSCOPY WITH PROPOFOL N/A 04/05/2017   Procedure: COLONOSCOPY WITH PROPOFOL;  Surgeon: Daneil Dolin, MD;  Location: [facility] ENDO SUITE;  Service: Endoscopy;  Laterality: N/A;  12:30pm-pt notified to arrive at 9:15am for 10:45am procedure per KF  . ELBOW SURGERY Left   . FOOT SURGERY Right   . HERNIA REPAIR Right    inguinal-  age 48  . KNEE ARTHROSCOPY WITH MEDIAL MENISECTOMY Left 12/07/2016   Procedure: KNEE ARTHROSCOPY WITH MEDIAL MENISECTOMY;  Surgeon: Carole Civil, MD;  Location: [facility];  Service: Orthopedics;  Laterality: Left;  . KNEE SURGERY    . left elbow    . LUMBAR LAMINECTOMY/DECOMPRESSION MICRODISCECTOMY Left 10/06/2013   Procedure: Left Lumbar Three-four microdiskectomy;  Surgeon: Ophelia Charter, MD;  Location: [place] NEURO ORS;  Service: Neurosurgery;  Laterality: Left;  Left Lumbar Three-four microdiskectomy  . right foot     forgein body removal  . right knee  orif right patella [place] 1993  . SEPTOPLASTY    . TOTAL KNEE ARTHROPLASTY Left 02/18/2019   Procedure: TOTAL KNEE ARTHROPLASTY;  Surgeon: Carole Civil, MD;  Location: [facility];  Service: Orthopedics;  Laterality: Left;    There were no vitals filed for this visit.   Subjective Assessment - 03/13/19 1427    Subjective  Patient presents to physical therapy with complaint of LT knee pain s/p LT TKA on 02/18/19. Patient says he just had follow up with MD 03/10/19 for final staple removal and was referred for starting therapy. Patient reports no complications since surgery. Patient says pain is moderate and has been managing sx with pain meds, and  ice. Patient denies fevers, chills, calf pain. Patient says he has been using CPM at home set (0-94 degrees). Patient reports having home health therapy 4 visits.    Limitations  Sitting;Lifting;Standing;Walking;House hold activities    How long can you sit comfortably?  15 minutes    How long can you stand comfortably?  10 minutes    How long can you walk comfortably?  <5 minutes    Patient Stated Goals  To get well    Currently in Pain?  Yes    Pain Score  6     Pain Location  Knee    Pain Orientation  Left;Anterior    Pain Descriptors / Indicators  Dull;Throbbing;Aching;Constant;Sharp;Tingling    Pain Type  Surgical pain    Pain Onset  1 to 4 weeks ago    Pain Frequency  Constant     Aggravating Factors   Standing, walking, bending    Pain Relieving Factors  pain meds, rest, ice    Effect of Pain on Daily Activities  Limits         [facility] PT Assessment - 03/13/19 0001      Assessment   Medical Diagnosis  LT TKA    Referring Provider (PT)  Arther Abbott MD     Onset Date/Surgical Date  02/18/19    Prior Therapy  No      Precautions   Precautions  Fall      Restrictions   Weight Bearing Restrictions  No      Balance Screen   Has the patient fallen in the past 6 months  Yes    How many times?  5    Has the patient had a decrease in activity level because of a fear of falling?   Yes    Is the patient reluctant to leave their home because of a fear of falling?   Yes      [place] residence    Living Arrangements  Alone    Type of [place] to enter    Entrance Stairs-Number of Steps  4    Entrance Stairs-Rails  Can reach both    [place]  One level      Prior Function   Level of Independence  Independent with community mobility with device   used cane prior to surgery    Vocation  On disability    Leisure  fishing      Cognition   Overall Cognitive Status  Within Functional Limits for tasks assessed      Observation/Other Assessments-Edema    Edema  --   moderate     Sensation   Light Touch  Appears Intact      ROM / Strength   AROM / PROM / Strength  AROM;Strength      AROM   AROM Assessment Site  Knee    Right/Left Knee  Right;Left    Right Knee Extension  0    Right Knee Flexion  130    Left Knee Extension  -3    Left Knee Flexion  73      Strength   Strength Assessment Site  Hip;Knee;Ankle    Right/Left Hip  Right;Left    Right Hip Flexion  5/5    Right Hip Extension  5/5    Right Hip ABduction  5/5    Left Hip Flexion  4/5    Left Hip  Extension  4/5    Left Hip ABduction  4-/5    Right/Left Knee  Right;Left    Right Knee Flexion  5/5    Right Knee  Extension  5/5    Left Knee Flexion  4-/5    Left Knee Extension  3-/5    Right/Left Ankle  Right;Left    Right Ankle Dorsiflexion  5/5    Left Ankle Dorsiflexion  4+/5      Palpation   Palpation comment  Mod tenderness to palpation diffuse about LT knee joint, incisions, distal quad, proximal calf      Ambulation/Gait   Ambulation/Gait  Yes    Ambulation Distance (Feet)  110 Feet    Assistive device  Rolling walker    Gait Pattern  Decreased stance time - left;Decreased step length - left;Decreased hip/knee flexion - left;Antalgic    Ambulation Surface  Level    Gait velocity  decreased    Gait Comments  2MWT      Balance   Balance Assessed  Yes      Static Standing Balance   Static Standing Balance -  Activities   Single Leg Stance - Right Leg;Single Leg Stance - Left Leg;Tandam Stance - Right Leg;Tandam Stance - Left Leg    Static Standing - Comment/# of Minutes  15 sec; unable; 8 sec, mod sway; 10 sec, mod sway                 Objective measurements completed on examination: See above findings.              PT Education - 03/13/19 1433    Education Details  Patient educated on evaluation findings, POC and HEP    Person(s) Educated  Patient    Methods  Explanation    Comprehension  Verbalized understanding       PT Short Term Goals - 03/13/19 1513      PT SHORT TERM GOAL #1   Title  Patient will be independent with initial HEP to improve functional outcomes    Time  3    Period  Weeks    Status  New    Target Date  04/03/19        PT Long Term Goals - 03/13/19 1514      PT LONG TERM GOAL #1   Title  Patient will have LT knee AROM 0-120 degrees to improve functional mobility and facilitate squatting to pick up items from floor.    Time  6    Period  Weeks    Status  New    Target Date  04/24/19      PT LONG TERM GOAL #2   Title  Patient will be able to maintain tandem stance >30 seconds on BLEs to improve stability and reduce risk for  falls    Time  6    Period  Weeks    Status  New    Target Date  04/24/19      PT LONG TERM GOAL #3   Title  Patient will have equal to or > 4+/5 MMT throughout LLE to improve ability to perform functional mobility, stair ambulation and ADLs.    Time  6    Period  Weeks    Status  New    Target Date  04/24/19      PT LONG TERM GOAL #4   Title  Patient will be able to ambulate at least 225 feet during 2MWT with LRAD to  demonstrate improved ability to perform functional mobility and associated tasks.    Time  6    Period  Weeks    Status  New    Target Date  04/24/19             Plan - 03/13/19 1506    Clinical Impression Statement  Patient is a 48 y.o. male who presents to physical therapy with complaint of LT knee pain s/p LT TKA on 02/18/19. Patient demonstrates decreased strength, ROM restriction, balance deficits and gait abnormalities which are likely contributing to symptoms of pain and are negatively impacting patient ability to perform ADLs and functional mobility tasks. Patient will benefit from skilled physical therapy services to address these deficits to reduce pain, improve level of function with ADLs, functional mobility tasks, and reduce risk for falls.    Examination-Activity Limitations  Lift;Stand;Locomotion Level;Transfers;Carry;Dressing;Stairs;Squat;Sleep;Sit;Bend    Examination-Participation Restrictions  Yard Work;Cleaning;Community Activity    Stability/Clinical Decision Making  Stable/Uncomplicated    Clinical Decision Making  Low    Rehab Potential  Good    PT Frequency  3x / week    PT Duration  6 weeks    PT Treatment/Interventions  ADLs/Self Care Home Management;Biofeedback;Fluidtherapy;Ultrasound;Moist Heat;Electrical Stimulation;DME Instruction;Gait training;Stair training;Neuromuscular re-education;Functional mobility training;Therapeutic activities;Patient/family education;Manual techniques;Splinting;Energy conservation;Passive range of motion;Scar  mobilization;Vasopneumatic Device;Joint Manipulations;Taping;Compression bandaging;Orthotic Fit/Training;Therapeutic exercise;Balance training;Prosthetic Training    PT Next Visit Plan  Initiate treatment. Review goals and HEP. Add manual for pain and edema, ROM. Progress LLE strength and balance activity as tolerated.    PT Home Exercise Plan  03/13/19: quad set, glute set, heel slide, heel raise    Consulted and Agree with Plan of Care  Patient       Patient will benefit from skilled therapeutic intervention in order to improve the following deficits and impairments:  Abnormal gait, Pain, Improper body mechanics, Decreased mobility, Decreased scar mobility, Hypomobility, Decreased strength, Decreased range of motion, Decreased endurance, Decreased activity tolerance, Decreased balance, Difficulty walking, Increased edema, Impaired flexibility  Visit Diagnosis: Acute pain of left knee  Stiffness of left knee, not elsewhere classified  Muscle weakness (generalized)  Other abnormalities of gait and mobility     Problem List Patient Active Problem List   Diagnosis Date Noted  . S/P total knee replacement, left 02/18/2019 03/04/2019  . Osteoarthritis of left knee 02/18/2019  . Effusion of knee joint, left 06/03/2018  . S/P left knee arthroscopy 12/07/16 04/13/2017  . Family history of colon cancer 02/28/2017  . Hepatic cirrhosis ([place]) 02/28/2017  . Derangement of posterior horn of medial meniscus of left knee   . Primary osteoarthritis of left knee   . Chondromalacia of medial femoral condyle, left   . Hepatitis C 06/15/2016  . Lumbar herniated disc 10/06/2013  . Carpal tunnel syndrome 07/28/2013    4:10 PM, 03/13/19 Josue Hector PT DPT  Physical Therapist with [facility]  [phone] [place] [address] [place], [place], [address] Phone: [phone]   Fax:  [phone]  Name: Jamie [name] MRN: [id number] Date of Birth: Aug 15, 1970

## 2019-03-13 NOTE — Patient Instructions (Signed)
Access Code: [email or web address]  URL: [email or web address]  Date: 03/13/2019  Prepared by: Josue Hector   Exercises Long Sitting Quad Set with Towel Roll Under Heel - 10 reps - 2 sets - 2x daily - 7x weekly Supine Heel Slide with Strap - 10 reps - 2 sets - 2x daily - 7x weekly Supine Gluteal Sets - 10 reps - 2 sets - 2x daily - 7x weekly Heel rises with counter support - 10 reps - 2 sets - 2x daily - 7x weekly

## 2019-03-17 ENCOUNTER — Ambulatory Visit (HOSPITAL_COMMUNITY): Payer: Medicaid Other | Admitting: Physical Therapy

## 2019-03-17 ENCOUNTER — Other Ambulatory Visit: Payer: Self-pay | Admitting: Orthopedic Surgery

## 2019-03-17 DIAGNOSIS — Z96652 Presence of left artificial knee joint: Secondary | ICD-10-CM

## 2019-03-17 MED ORDER — OXYCODONE HCL 5 MG PO TABS: 5.0000 mg | ORAL_TABLET | Freq: Four times a day (QID) | ORAL | 0 refills | Status: DC | PRN

## 2019-03-17 NOTE — Telephone Encounter (Signed)
Patient requests for refill on Oxycodone 5 mgs.  Qty  30  Sig: Take 1 tablet (5 mg total) by mouth every 4 (four) hours as needed for severe pain.  He states he uses [facility] on [address]

## 2019-03-17 NOTE — Progress Notes (Signed)
Meds ordered this encounter  Medications  . oxyCODONE (OXY IR/ROXICODONE) 5 MG immediate release tablet    Sig: Take 1 tablet (5 mg total) by mouth every 6 (six) hours as needed for up to 5 days for severe pain.    Dispense:  20 tablet    Refill:  0    Medication taper

## 2019-03-19 ENCOUNTER — Other Ambulatory Visit: Payer: Self-pay

## 2019-03-19 ENCOUNTER — Ambulatory Visit (HOSPITAL_COMMUNITY): Payer: Medicaid Other | Admitting: Physical Therapy

## 2019-03-19 ENCOUNTER — Encounter (HOSPITAL_COMMUNITY): Payer: Self-pay | Admitting: Physical Therapy

## 2019-03-19 DIAGNOSIS — M25562 Pain in left knee: Secondary | ICD-10-CM | POA: Diagnosis not present

## 2019-03-19 DIAGNOSIS — M6281 Muscle weakness (generalized): Secondary | ICD-10-CM

## 2019-03-19 DIAGNOSIS — R2689 Other abnormalities of gait and mobility: Secondary | ICD-10-CM

## 2019-03-19 DIAGNOSIS — M25662 Stiffness of left knee, not elsewhere classified: Secondary | ICD-10-CM

## 2019-03-19 NOTE — Therapy (Signed)
[place], [place], [address] Phone: [phone]   Fax:  [phone]  Physical Therapy Treatment  Patient Details  Name: Jamie [name] MRN: [id number] Date of Birth: [birth year] Referring Provider (PT): Arther Abbott MD    Encounter Date: 03/19/2019  PT End of Session - 03/19/19 1522    Visit Number  2    Number of Visits  13    Date for PT Re-Evaluation  04/24/19    Authorization Type  Medicaid [place]    Authorization Time Period  03/13/19-04/24/19    Authorization - Visit Number  2    Authorization - Number of Visits  4    PT Start Time  1520    PT Stop Time  1600    PT Time Calculation (min)  40 min    Activity Tolerance  Patient tolerated treatment well    Behavior During Therapy  [facility] for tasks assessed/performed       Past Medical History:  Diagnosis Date  . Anxiety   . Arthritis   . Chronic back pain   . Chronic knee pain   . GERD (gastroesophageal reflux disease)    occ  . HCV antibody positive   . Hypertension    "Dr Karie Kirks took me off meds"  diet control  . Lumbar radiculopathy   . Pre-diabetes     Past Surgical History:  Procedure Laterality Date  . BACK SURGERY    . CARPAL TUNNEL RELEASE Right 06/25/2013   Procedure: CARPAL TUNNEL RELEASE;  Surgeon: Carole Civil, MD;  [facility]: [facility];  Service: Orthopedics;  Laterality: Right;  . CARPAL TUNNEL RELEASE Left 07/25/2013   Procedure: LEFT CARPAL TUNNEL RELEASE;  Surgeon: Carole Civil, MD;  Location: [facility];  Service: Orthopedics;  Laterality: Left;  . COLONOSCOPY WITH PROPOFOL N/A 04/05/2017   Procedure: COLONOSCOPY WITH PROPOFOL;  Surgeon: Daneil Dolin, MD;  [facility];  Service: Endoscopy;  Laterality: N/A;  12:30pm-pt notified to arrive at 9:15am for 10:45am procedure per KF  . ELBOW SURGERY Left   . FOOT SURGERY Right   . HERNIA REPAIR Right    inguinal- age 46  . KNEE ARTHROSCOPY WITH MEDIAL  MENISECTOMY Left 12/07/2016   Procedure: KNEE ARTHROSCOPY WITH MEDIAL MENISECTOMY;  Surgeon: Carole Civil, MD;  Location: [facility];  Service: Orthopedics;  Laterality: Left;  . KNEE SURGERY    . left elbow    . LUMBAR LAMINECTOMY/DECOMPRESSION MICRODISCECTOMY Left 10/06/2013   Procedure: Left Lumbar Three-four microdiskectomy;  Surgeon: Ophelia Charter, MD;  Location: [place] [facility];  Service: Neurosurgery;  Laterality: Left;  Left Lumbar Three-four microdiskectomy  . right foot     forgein body removal  . right knee  orif right patella Keeling 1993  . SEPTOPLASTY    . TOTAL KNEE ARTHROPLASTY Left 02/18/2019   Procedure: TOTAL KNEE ARTHROPLASTY;  Surgeon: Carole Civil, MD;  Location: [facility];  Service: Orthopedics;  Laterality: Left;    There were no vitals filed for this visit.  Subjective Assessment - 03/19/19 1522    Subjective  Patient says his knee is stiff and swollen today. Patient says he has been doing exercises with no issues.    Limitations  Sitting;Lifting;Standing;Walking;House hold activities    How long can you sit comfortably?  15 minutes    How long can you stand comfortably?  10 minutes    How long can you walk comfortably?  <5 minutes  Patient Stated Goals  To get well    Currently in Pain?  Yes    Pain Score  5     Pain Location  Knee    Pain Orientation  Left;Anterior;Medial    Pain Descriptors / Indicators  Stabbing;Sharp    Pain Type  Surgical pain    Pain Onset  1 to 4 weeks ago    Pain Frequency  Intermittent                       [facility] Adult PT Treatment/Exercise - 03/19/19 0001      Exercises   Exercises  Knee/Hip      Knee/Hip Exercises: Stretches   Knee: Self-Stretch to increase Flexion  Left;5 reps;10 seconds    Knee: Self-Stretch Limitations  on 10 inch box    Gastroc Stretch  Both;3 reps;30 seconds    Gastroc Stretch Limitations  slant board      Knee/Hip Exercises: Standing   Heel Raises  Both;20 reps    Lateral  Step Up  Left;10 reps;Hand Hold: 1;Step Height: 4"    Forward Step Up  Left;10 reps;Hand Hold: 1;Step Height: 4"      Knee/Hip Exercises: Supine   Quad Sets  Left;10 reps    Quad Sets Limitations  5 sec hold    Heel Slides  Left;10 reps    Straight Leg Raises  Left;10 reps    Knee Extension  AROM    Knee Extension Limitations  -2    Knee Flexion  AROM    Knee Flexion Limitations  85      Manual Therapy   Manual Therapy  Edema management;Joint mobilization    Manual therapy comments  All manuals performed seperate from all other activity     Edema Management  retro message with LT leg elevated for improved fluid return     Joint Mobilization  Grade II patellar mobs in all planes for improved mobility                PT Short Term Goals - 03/13/19 1513      PT SHORT TERM GOAL #1   Title  Patient will be independent with initial HEP to improve functional outcomes    Time  3    Period  Weeks    Status  New    Target Date  04/03/19        PT Long Term Goals - 03/13/19 1514      PT LONG TERM GOAL #1   Title  Patient will have LT knee AROM 0-120 degrees to improve functional mobility and facilitate squatting to pick up items from floor.    Time  6    Period  Weeks    Status  New    Target Date  04/24/19      PT LONG TERM GOAL #2   Title  Patient will be able to maintain tandem stance >30 seconds on BLEs to improve stability and reduce risk for falls    Time  6    Period  Weeks    Status  New    Target Date  04/24/19      PT LONG TERM GOAL #3   Title  Patient will have equal to or > 4+/5 MMT throughout LLE to improve ability to perform functional mobility, stair ambulation and ADLs.    Time  6    Period  Weeks    Status  New  Target Date  04/24/19      PT LONG TERM GOAL #4   Title  Patient will be able to ambulate at least 225 feet during 2MWT with LRAD to demonstrate improved ability to perform functional mobility and associated tasks.    Time  6    Period   Weeks    Status  New    Target Date  04/24/19            Plan - 03/19/19 1605    Clinical Impression Statement  Initiated treatment this visit. Reviewed patient goals and HEP. Patient educated on purpose and function of all added exercise. Patient able to progress to step up and side step up on 4 inch box, but with noted decreased control on eccentric portion. Patient cued on decreasing use of arms during step up to increase target muscle activation in legs. Patient did well with static tandem balance but showed increased sway with fatigue, and had LOB x 1 which was corrected with therapist assist. Patient with noted increase in gait speed and step length post treatment.    Examination-Activity Limitations  Lift;Stand;Locomotion Level;Transfers;Carry;Dressing;Stairs;Squat;Sleep;Sit;Bend    Examination-Participation Restrictions  Yard Work;Cleaning;Community Activity    Stability/Clinical Decision Making  Stable/Uncomplicated    Rehab Potential  Good    PT Frequency  3x / week    PT Duration  6 weeks    PT Treatment/Interventions  ADLs/Self Care Home Management;Biofeedback;Fluidtherapy;Ultrasound;Moist Heat;Electrical Stimulation;DME Instruction;Gait training;Stair training;Neuromuscular re-education;Functional mobility training;Therapeutic activities;Patient/family education;Manual techniques;Splinting;Energy conservation;Passive range of motion;Scar mobilization;Vasopneumatic Device;Joint Manipulations;Taping;Compression bandaging;Orthotic Fit/Training;Therapeutic exercise;Balance training;Prosthetic Training    PT Next Visit Plan  Continue to progress LT knee AROM and LLE strength as tolerated. Add sit to stands and sidestepping next visit.    PT Home Exercise Plan  03/13/19: quad set, glute set, heel slide, heel raise    Consulted and Agree with Plan of Care  Patient       Patient will benefit from skilled therapeutic intervention in order to improve the following deficits and  impairments:  Abnormal gait, Pain, Improper body mechanics, Decreased mobility, Decreased scar mobility, Hypomobility, Decreased strength, Decreased range of motion, Decreased endurance, Decreased activity tolerance, Decreased balance, Difficulty walking, Increased edema, Impaired flexibility  Visit Diagnosis: Acute pain of left knee  Stiffness of left knee, not elsewhere classified  Muscle weakness (generalized)  Other abnormalities of gait and mobility     Problem List Patient Active Problem List   Diagnosis Date Noted  . S/P total knee replacement, left 02/18/2019 03/04/2019  . Osteoarthritis of left knee 02/18/2019  . Effusion of knee joint, left 06/03/2018  . S/P left knee arthroscopy 12/07/16 04/13/2017  . Family history of colon cancer 02/28/2017  . Hepatic cirrhosis ([place]) 02/28/2017  . Derangement of posterior horn of medial meniscus of left knee   . Primary osteoarthritis of left knee   . Chondromalacia of medial femoral condyle, left   . Hepatitis C 06/15/2016  . Lumbar herniated disc 10/06/2013  . Carpal tunnel syndrome 07/28/2013   4:08 PM, 03/19/19 Josue Hector PT DPT  Physical Therapist with [facility]  [phone]   [facility] [address] [place], [place], [address] Phone: [phone]   Fax:  [phone]  Name: Jamie [name] MRN: [id number] Date of Birth: 17-Jul-1970

## 2019-03-21 ENCOUNTER — Encounter (HOSPITAL_COMMUNITY): Payer: Self-pay | Admitting: Physical Therapy

## 2019-03-21 ENCOUNTER — Other Ambulatory Visit: Payer: Self-pay

## 2019-03-21 ENCOUNTER — Ambulatory Visit (HOSPITAL_COMMUNITY): Payer: Medicaid Other | Admitting: Physical Therapy

## 2019-03-21 DIAGNOSIS — R2689 Other abnormalities of gait and mobility: Secondary | ICD-10-CM

## 2019-03-21 DIAGNOSIS — M6281 Muscle weakness (generalized): Secondary | ICD-10-CM

## 2019-03-21 DIAGNOSIS — M25562 Pain in left knee: Secondary | ICD-10-CM | POA: Diagnosis not present

## 2019-03-21 DIAGNOSIS — M25662 Stiffness of left knee, not elsewhere classified: Secondary | ICD-10-CM

## 2019-03-21 NOTE — Therapy (Signed)
[place], [place], [address] Phone: [phone]   Fax:  [phone]  Physical Therapy Treatment  Patient Details  Name: Jamie [name] MRN: [id number] Date of Birth: [birth year] Referring Provider (PT): Arther Abbott MD    Encounter Date: 03/21/2019  PT End of Session - 03/21/19 0909    Visit Number  3    Number of Visits  13    Date for PT Re-Evaluation  04/24/19    Authorization Type  Medicaid [place]    Authorization Time Period  03/13/19-04/24/19    Authorization - Visit Number  3    Authorization - Number of Visits  4    PT Start Time  0912    PT Stop Time  0958    PT Time Calculation (min)  46 min    Activity Tolerance  Patient tolerated treatment well    Behavior During Therapy  [facility] for tasks assessed/performed       Past Medical History:  Diagnosis Date  . Anxiety   . Arthritis   . Chronic back pain   . Chronic knee pain   . GERD (gastroesophageal reflux disease)    occ  . HCV antibody positive   . Hypertension    "Dr Karie Kirks took me off meds"  diet control  . Lumbar radiculopathy   . Pre-diabetes     Past Surgical History:  Procedure Laterality Date  . BACK SURGERY    . CARPAL TUNNEL RELEASE Right 06/25/2013   Procedure: CARPAL TUNNEL RELEASE;  Surgeon: Carole Civil, MD;  [facility];  Service: Orthopedics;  Laterality: Right;  . CARPAL TUNNEL RELEASE Left 07/25/2013   Procedure: LEFT CARPAL TUNNEL RELEASE;  Surgeon: Carole Civil, MD;  Location: [facility];  Service: Orthopedics;  Laterality: Left;  . COLONOSCOPY WITH PROPOFOL N/A 04/05/2017   Procedure: COLONOSCOPY WITH PROPOFOL;  Surgeon: Daneil Dolin, MD;  [facility];  Service: Endoscopy;  Laterality: N/A;  12:30pm-pt notified to arrive at 9:15am for 10:45am procedure per KF  . ELBOW SURGERY Left   . FOOT SURGERY Right   . HERNIA REPAIR Right    inguinal- age 34  . KNEE ARTHROSCOPY WITH MEDIAL  MENISECTOMY Left 12/07/2016   Procedure: KNEE ARTHROSCOPY WITH MEDIAL MENISECTOMY;  Surgeon: Carole Civil, MD;  Location: [facility];  Service: Orthopedics;  Laterality: Left;  . KNEE SURGERY    . left elbow    . LUMBAR LAMINECTOMY/DECOMPRESSION MICRODISCECTOMY Left 10/06/2013   Procedure: Left Lumbar Three-four microdiskectomy;  Surgeon: Ophelia Charter, MD;  Location: [place] [facility] OR[facility];  Service: Neurosurgery;  Laterality: Left;  Left Lumbar Three-four microdiskectomy  . right foot     forgein body removal  . right knee  orif right patella Keeling 1993  . SEPTOPLASTY    . TOTAL KNEE ARTHROPLASTY Left 02/18/2019   Procedure: TOTAL KNEE ARTHROPLASTY;  Surgeon: Carole Civil, MD;  Location: [facility];  Service: Orthopedics;  Laterality: Left;    There were no vitals filed for this visit.  Subjective Assessment - 03/21/19 0908    Subjective  States he has been using the machine at home and he has gotten it up to 85 and it is still a little tight. States he isn't doing great this morning. States last night he was walking down the hallway and stepped wrong (he was using the walker) and he twisted his right ankle and it has been a little sore since. States his  knee feels stiff and painful. States he has been icing and elevating his right ankle but hasn't noticed any swelling.    Limitations  Sitting;Lifting;Standing;Walking;House hold activities    How long can you sit comfortably?  15 minutes    How long can you stand comfortably?  10 minutes    How long can you walk comfortably?  <5 minutes    Patient Stated Goals  To get well    Currently in Pain?  Yes    Pain Score  5     Pain Orientation  Left;Medial    Pain Descriptors / Indicators  Aching;Sharp    Pain Type  Surgical pain    Pain Onset  1 to 4 weeks ago         [facility] PT Assessment - 03/21/19 0001      Assessment   Medical Diagnosis  LT TKA    Referring Provider (PT)  Arther Abbott MD     Onset Date/Surgical Date  02/18/19     Prior Therapy  No      Observation/Other Assessments   Observations  mild swelling noted along medial aspect of right ankle - no bruising noted at this time.                    [facility] Adult PT Treatment/Exercise - 03/21/19 0001      Ambulation/Gait   Ambulation/Gait  Yes    Ambulation/Gait Assistance  6: Modified independent (Device/Increase time)    Ambulation Distance (Feet)  200 Feet    Assistive device  Rolling walker    Gait Pattern  Decreased stance time - left;Decreased step length - left;Decreased hip/knee flexion - left;Antalgic    Ambulation Surface  Level;Indoor    Gait velocity  decreased    Gait Comments  2MWT      Knee/Hip Exercises: Aerobic   Stationary Bike  rocking back and forth- 6 minutes - PT guidance       Knee/Hip Exercises: Seated   Long Arc Quad  AROM;Strengthening;Left;2 sets;10 reps   3 sec holds     Knee/Hip Exercises: Supine   Short Arc Quad Sets  AROM;Left;3 sets;5 reps   3" holds   Knee Extension  Left;AROM    Knee Extension Limitations  0    Knee Flexion  Left;AROM    Knee Flexion Limitations  88    Other Supine Knee/Hip Exercises  ankle pumps and ankle circles R x20 Each       Manual Therapy   Manual Therapy  Edema management;Joint mobilization    Manual therapy comments  All manuals performed seperate from all other activity     Edema Management  retro message with LT leg elevated for improved fluid return     Joint Mobilization  Grade II patellar mobs in all planes for improved mobility              PT Education - 03/21/19 1001    Education Details  educated patient in walker height, adjusted walker to appropriate height (patient was picking up walker previously) and added tennis balls for easier use of walker and to decrease risk of taking a wrong step.    Person(s) Educated  Patient    Methods  Explanation    Comprehension  Verbalized understanding       PT Short Term Goals - 03/13/19 1513      PT SHORT TERM  GOAL #1   Title  Patient will be independent with initial HEP  to improve functional outcomes    Time  3    Period  Weeks    Status  New    Target Date  04/03/19        PT Long Term Goals - 03/13/19 1514      PT LONG TERM GOAL #1   Title  Patient will have LT knee AROM 0-120 degrees to improve functional mobility and facilitate squatting to pick up items from floor.    Time  6    Period  Weeks    Status  New    Target Date  04/24/19      PT LONG TERM GOAL #2   Title  Patient will be able to maintain tandem stance >30 seconds on BLEs to improve stability and reduce risk for falls    Time  6    Period  Weeks    Status  New    Target Date  04/24/19      PT LONG TERM GOAL #3   Title  Patient will have equal to or > 4+/5 MMT throughout LLE to improve ability to perform functional mobility, stair ambulation and ADLs.    Time  6    Period  Weeks    Status  New    Target Date  04/24/19      PT LONG TERM GOAL #4   Title  Patient will be able to ambulate at least 225 feet during 2MWT with LRAD to demonstrate improved ability to perform functional mobility and associated tasks.    Time  6    Period  Weeks    Status  New    Target Date  04/24/19            Plan - 03/21/19 0909    Clinical Impression Statement  Patient had recent injury to right ankle where he took a wrong step (no loss of balance). No bruising noted in right ankle but mild swelling and medial tenderness noted. Instructed patient to continue to elevate, ice and perform ankle ROM exercises as tolerated. Adjusted walker to appropriate height and added tennis balls and this improved patient's ability to ambulate. Will continue to benefit from skilled physical therapy for post operative rehabilitation.    Examination-Activity Limitations  Lift;Stand;Locomotion Level;Transfers;Carry;Dressing;Stairs;Squat;Sleep;Sit;Bend    Examination-Participation Restrictions  Yard Work;Cleaning;Community Activity     Stability/Clinical Decision Making  Stable/Uncomplicated    Rehab Potential  Good    PT Frequency  3x / week    PT Duration  6 weeks    PT Treatment/Interventions  ADLs/Self Care Home Management;Biofeedback;Fluidtherapy;Ultrasound;Moist Heat;Electrical Stimulation;DME Instruction;Gait training;Stair training;Neuromuscular re-education;Functional mobility training;Therapeutic activities;Patient/family education;Manual techniques;Splinting;Energy conservation;Passive range of motion;Scar mobilization;Vasopneumatic Device;Joint Manipulations;Taping;Compression bandaging;Orthotic Fit/Training;Therapeutic exercise;Balance training;Prosthetic Training    PT Next Visit Plan  Continue to progress LT knee AROM and LLE strength as tolerated. Add sit to stands and sidestepping next visit.    PT Home Exercise Plan  03/13/19: quad set, glute set, heel slide, heel raise; 03/21/19 LAQs, SAQs, ankle pumps, ankle circles.    Consulted and Agree with Plan of Care  Patient       Patient will benefit from skilled therapeutic intervention in order to improve the following deficits and impairments:  Abnormal gait, Pain, Improper body mechanics, Decreased mobility, Decreased scar mobility, Hypomobility, Decreased strength, Decreased range of motion, Decreased endurance, Decreased activity tolerance, Decreased balance, Difficulty walking, Increased edema, Impaired flexibility  Visit Diagnosis: Acute pain of left knee  Stiffness of left knee, not elsewhere classified  Muscle weakness (  generalized)  Other abnormalities of gait and mobility     Problem List Patient Active Problem List   Diagnosis Date Noted  . S/P total knee replacement, left 02/18/2019 03/04/2019  . Osteoarthritis of left knee 02/18/2019  . Effusion of knee joint, left 06/03/2018  . S/P left knee arthroscopy 12/07/16 04/13/2017  . Family history of colon cancer 02/28/2017  . Hepatic cirrhosis ([place]) 02/28/2017  . Derangement of posterior horn  of medial meniscus of left knee   . Primary osteoarthritis of left knee   . Chondromalacia of medial femoral condyle, left   . Hepatitis C 06/15/2016  . Lumbar herniated disc 10/06/2013  . Carpal tunnel syndrome 07/28/2013    10:17 AM, 03/21/19 Jerene Pitch, DPT Physical Therapy with [facility]  [phone] office  [place] [address] [place], [place], [address] Phone: [phone]   Fax:  [phone]  Name: Jamie [name] MRN: [id number] Date of Birth: [birth year]

## 2019-03-24 ENCOUNTER — Ambulatory Visit (HOSPITAL_COMMUNITY): Payer: Medicaid Other | Admitting: Physical Therapy

## 2019-03-24 ENCOUNTER — Telehealth (HOSPITAL_COMMUNITY): Payer: Self-pay | Admitting: Physical Therapy

## 2019-03-24 NOTE — Telephone Encounter (Signed)
Called and spoke with patient about missed appointment at [facility] today. Patient said he forgot and did not want to leave his house in current weather condition. Patient was informed of his next visit on 12/16, and that authorization for more visits has been submitted.    10:43 AM, 03/24/19 Josue Hector PT DPT  Physical Therapist with [facility]  [phone]

## 2019-03-25 ENCOUNTER — Other Ambulatory Visit: Payer: Self-pay

## 2019-03-25 MED ORDER — OXYCODONE HCL 5 MG PO CAPS: 5.0000 mg | ORAL_CAPSULE | Freq: Four times a day (QID) | ORAL | 0 refills | Status: DC | PRN

## 2019-03-25 NOTE — Telephone Encounter (Signed)
Oxycodone 5MG  immediate release tablet  Qty 20 Tablets  Take 1 tablet (5 mg total) by mouth every 6(six) hours as needed for up to 5 days for severe pain.   PATIENT USES [facility] [address]

## 2019-03-26 ENCOUNTER — Telehealth: Payer: Self-pay

## 2019-03-26 ENCOUNTER — Ambulatory Visit (HOSPITAL_COMMUNITY): Payer: Medicaid Other | Admitting: Physical Therapy

## 2019-03-26 ENCOUNTER — Other Ambulatory Visit: Payer: Self-pay | Admitting: Orthopedic Surgery

## 2019-03-26 ENCOUNTER — Telehealth (HOSPITAL_COMMUNITY): Payer: Self-pay | Admitting: Physical Therapy

## 2019-03-26 DIAGNOSIS — Z96652 Presence of left artificial knee joint: Secondary | ICD-10-CM

## 2019-03-26 MED ORDER — OXYCODONE HCL 5 MG PO TABS: 5.0000 mg | ORAL_TABLET | Freq: Four times a day (QID) | ORAL | 0 refills | Status: AC | PRN

## 2019-03-26 NOTE — Telephone Encounter (Signed)
Patient called stating pharmacy told him that his insurance will not pay for the Oxycodone 5 mg capsules and they don't have them in stock at this time. He is asking if Dr. Aline Brochure would change his prescription to tablets instead of capsules.     Take 1 tablet (5 mg total) by mouth every 6 (six) hours as needed for up to 7 days.  PATIENT USES [facility] [address]

## 2019-03-26 NOTE — Progress Notes (Signed)
Tablets Rx d

## 2019-03-26 NOTE — Telephone Encounter (Signed)
I m going to stop this med altogether

## 2019-03-26 NOTE — Telephone Encounter (Signed)
had to cancel today's appt per cameron due mcd auth not available. pt is aware.

## 2019-03-26 NOTE — Telephone Encounter (Signed)
Computer changes it to capsule, do you know how to fix this?

## 2019-03-27 ENCOUNTER — Encounter (HOSPITAL_COMMUNITY): Payer: Self-pay | Admitting: Physical Therapy

## 2019-03-27 ENCOUNTER — Other Ambulatory Visit: Payer: Self-pay

## 2019-03-27 ENCOUNTER — Ambulatory Visit (HOSPITAL_COMMUNITY): Payer: Medicaid Other | Admitting: Physical Therapy

## 2019-03-27 DIAGNOSIS — M25662 Stiffness of left knee, not elsewhere classified: Secondary | ICD-10-CM

## 2019-03-27 DIAGNOSIS — M25562 Pain in left knee: Secondary | ICD-10-CM

## 2019-03-27 DIAGNOSIS — M6281 Muscle weakness (generalized): Secondary | ICD-10-CM

## 2019-03-27 DIAGNOSIS — R2689 Other abnormalities of gait and mobility: Secondary | ICD-10-CM

## 2019-03-27 NOTE — Telephone Encounter (Signed)
I called him to advise Rx sent in for tablets, and Dr Aline Brochure wants him to d/c or get from pain management.   He was able to get from the pharmacy.

## 2019-03-27 NOTE — Therapy (Signed)
[place], [place], [address] Phone: [phone]   Fax:  [phone]  Physical Therapy Treatment  Patient Details  Name: Jamie [name] MRN: [id number] Date of Birth: 1970/08/01 Referring Provider (PT): Arther Abbott MD    Encounter Date: 03/27/2019  PT End of Session - 03/27/19 1520    Visit Number  4    Number of Visits  13    Date for PT Re-Evaluation  04/24/19    Authorization Type  Medicaid [place] (12/16-12/29; 6 units approved)    Authorization Time Period  03/13/19-04/24/19    Authorization - Visit Number  4    Authorization - Number of Visits  9    PT Start Time  [id number]    PT Stop Time  1600    PT Time Calculation (min)  45 min    Equipment Utilized During Treatment  Other (comment)   SPC   Activity Tolerance  Patient tolerated treatment well    Behavior During Therapy  [facility] for tasks assessed/performed       Past Medical History:  Diagnosis Date  . Anxiety   . Arthritis   . Chronic back pain   . Chronic knee pain   . GERD (gastroesophageal reflux disease)    occ  . HCV antibody positive   . Hypertension    "Dr Karie Kirks took me off meds"  diet control  . Lumbar radiculopathy   . Pre-diabetes     Past Surgical History:  Procedure Laterality Date  . BACK SURGERY    . CARPAL TUNNEL RELEASE Right 06/25/2013   Procedure: CARPAL TUNNEL RELEASE;  Surgeon: Carole Civil, MD;  Location: [facility];  Service: Orthopedics;  Laterality: Right;  . CARPAL TUNNEL RELEASE Left 07/25/2013   Procedure: LEFT CARPAL TUNNEL RELEASE;  Surgeon: Carole Civil, MD;  Location: [facility];  Service: Orthopedics;  Laterality: Left;  . COLONOSCOPY WITH PROPOFOL N/A 04/05/2017   Procedure: COLONOSCOPY WITH PROPOFOL;  Surgeon: Daneil Dolin, MD;  [facility];  Service: Endoscopy;  Laterality: N/A;  12:30pm-pt notified to arrive at 9:15am for 10:45am procedure per KF  . ELBOW SURGERY Left   . FOOT  SURGERY Right   . HERNIA REPAIR Right    inguinal- age 25  . KNEE ARTHROSCOPY WITH MEDIAL MENISECTOMY Left 12/07/2016   Procedure: KNEE ARTHROSCOPY WITH MEDIAL MENISECTOMY;  Surgeon: Carole Civil, MD;  Location: [facility];  Service: Orthopedics;  Laterality: Left;  . KNEE SURGERY    . left elbow    . LUMBAR LAMINECTOMY/DECOMPRESSION MICRODISCECTOMY Left 10/06/2013   Procedure: Left Lumbar Three-four microdiskectomy;  Surgeon: Ophelia Charter, MD;  Location: [place] [facility];  Service: Neurosurgery;  Laterality: Left;  Left Lumbar Three-four microdiskectomy  . right foot     forgein body removal  . right knee  orif right patella Keeling 1993  . SEPTOPLASTY    . TOTAL KNEE ARTHROPLASTY Left 02/18/2019   Procedure: TOTAL KNEE ARTHROPLASTY;  Surgeon: Carole Civil, MD;  Location: [facility];  Service: Orthopedics;  Laterality: Left;    There were no vitals filed for this visit.  Subjective Assessment - 03/27/19 1519    Subjective  Patient says he is doing very well. Patient says he has been doing his exercises at home and has noticed improvement. Patient says he has been able to walk with his cane some and notes that he has been using a compression garment and swelling is down.  Limitations  Sitting;Lifting;Standing;Walking;House hold activities    How long can you sit comfortably?  15 minutes    How long can you stand comfortably?  10 minutes    How long can you walk comfortably?  <5 minutes    Patient Stated Goals  To get well    Currently in Pain?  Yes    Pain Location  Knee    Pain Orientation  Left;Anterior    Pain Descriptors / Indicators  Aching    Pain Type  Surgical pain    Pain Onset  1 to 4 weeks ago                       [facility] Adult PT Treatment/Exercise - 03/27/19 0001      Knee/Hip Exercises: Stretches   Passive Hamstring Stretch  Left;5 reps;10 seconds    Passive Hamstring Stretch Limitations  on 10 inch box    Knee: Self-Stretch to increase  Flexion  Left;5 reps;10 seconds    Knee: Self-Stretch Limitations  on 10 inch box    Gastroc Stretch  Both;3 reps;30 seconds    Gastroc Stretch Limitations  slant board      Knee/Hip Exercises: Aerobic   Recumbent Bike  4 min; retro; seat 19 for mobility       Knee/Hip Exercises: Standing   Heel Raises  Both;20 reps    Forward Step Up  Left;Hand Hold: 1;Step Height: 4";15 reps    Step Down  Left;15 reps;Hand Hold: 2;Step Height: 4"    Gait Training  452 feet with SPC; cueing for streide length and heel to toe transitioning    Other Standing Knee Exercises  tandem stance; solid floor; 3 x 30"      Knee/Hip Exercises: Seated   Sit to Sand  2 sets;10 reps;without UE support      Knee/Hip Exercises: Supine   Knee Extension  Left;AROM    Knee Extension Limitations  1    Knee Flexion  Left;AROM    Knee Flexion Limitations  96      Manual Therapy   Manual Therapy  Edema management;Joint mobilization    Manual therapy comments  All manuals performed seperate from all other activity     Edema Management  retro message with LT leg elevated for improved fluid return     Joint Mobilization  Grade II patellar mobs in all planes for improved mobility                PT Short Term Goals - 03/13/19 1513      PT SHORT TERM GOAL #1   Title  Patient will be independent with initial HEP to improve functional outcomes    Time  3    Period  Weeks    Status  New    Target Date  04/03/19        PT Long Term Goals - 03/13/19 1514      PT LONG TERM GOAL #1   Title  Patient will have LT knee AROM 0-120 degrees to improve functional mobility and facilitate squatting to pick up items from floor.    Time  6    Period  Weeks    Status  New    Target Date  04/24/19      PT LONG TERM GOAL #2   Title  Patient will be able to maintain tandem stance >30 seconds on BLEs to improve stability and reduce risk for falls    Time  6    Period  Weeks    Status  New    Target Date  04/24/19       PT LONG TERM GOAL #3   Title  Patient will have equal to or > 4+/5 MMT throughout LLE to improve ability to perform functional mobility, stair ambulation and ADLs.    Time  6    Period  Weeks    Status  New    Target Date  04/24/19      PT LONG TERM GOAL #4   Title  Patient will be able to ambulate at least 225 feet during 2MWT with LRAD to demonstrate improved ability to perform functional mobility and associated tasks.    Time  6    Period  Weeks    Status  New    Target Date  04/24/19            Plan - 03/27/19 1611    Clinical Impression Statement  Patient is making very good progress to LTGs.  Patient shows good improvement in LT knee AROM, and good return for form and sequencing with gait using SPC. Patient shows good static balance with tandem stance, and good tolerance to step ups and downs form 4 inch step. Patient had slight difficulty with eccentric control with step downs, and noted slight increase in discomfort in LT knee with prolonged weight bearing, and cycling, but was able to achieve full revolution on recumbent bike at end of session.    Examination-Activity Limitations  Lift;Stand;Locomotion Level;Transfers;Carry;Dressing;Stairs;Squat;Sleep;Sit;Bend    Examination-Participation Restrictions  Yard Work;Cleaning;Community Activity    Stability/Clinical Decision Making  Stable/Uncomplicated    Rehab Potential  Good    PT Frequency  3x / week    PT Duration  6 weeks    PT Treatment/Interventions  ADLs/Self Care Home Management;Biofeedback;Fluidtherapy;Ultrasound;Moist Heat;Electrical Stimulation;DME Instruction;Gait training;Stair training;Neuromuscular re-education;Functional mobility training;Therapeutic activities;Patient/family education;Manual techniques;Splinting;Energy conservation;Passive range of motion;Scar mobilization;Vasopneumatic Device;Joint Manipulations;Taping;Compression bandaging;Orthotic Fit/Training;Therapeutic exercise;Balance training;Prosthetic  Training    PT Next Visit Plan  Continue to progress LT knee AROM and LLE strength as tolerated. Add TKEs and sidestepping next visit. Start with bike for ROM and warmup.    PT Home Exercise Plan  03/13/19: quad set, glute set, heel slide, heel raise; 03/21/19 LAQs, SAQs, ankle pumps, ankle circles.    Consulted and Agree with Plan of Care  Patient       Patient will benefit from skilled therapeutic intervention in order to improve the following deficits and impairments:  Abnormal gait, Pain, Improper body mechanics, Decreased mobility, Decreased scar mobility, Hypomobility, Decreased strength, Decreased range of motion, Decreased endurance, Decreased activity tolerance, Decreased balance, Difficulty walking, Increased edema, Impaired flexibility  Visit Diagnosis: Acute pain of left knee  Stiffness of left knee, not elsewhere classified  Muscle weakness (generalized)  Other abnormalities of gait and mobility     Problem List Patient Active Problem List   Diagnosis Date Noted  . S/P total knee replacement, left 02/18/2019 03/04/2019  . Osteoarthritis of left knee 02/18/2019  . Effusion of knee joint, left 06/03/2018  . S/P left knee arthroscopy 12/07/16 04/13/2017  . Family history of colon cancer 02/28/2017  . Hepatic cirrhosis ([place]) 02/28/2017  . Derangement of posterior horn of medial meniscus of left knee   . Primary osteoarthritis of left knee   . Chondromalacia of medial femoral condyle, left   . Hepatitis C 06/15/2016  . Lumbar herniated disc 10/06/2013  . Carpal tunnel syndrome 07/28/2013   4:14 PM, 03/27/19  Josue Hector PT DPT  Physical Therapist with [facility]  [phone] [place], [place], [address] Phone: [phone]   Fax:  [phone]  Name: Jamie [name] MRN: [id number] Date of Birth: 29-Jan-1971

## 2019-04-01 ENCOUNTER — Encounter (HOSPITAL_COMMUNITY): Payer: Self-pay

## 2019-04-01 ENCOUNTER — Other Ambulatory Visit: Payer: Self-pay

## 2019-04-01 ENCOUNTER — Ambulatory Visit (HOSPITAL_COMMUNITY): Payer: Medicaid Other

## 2019-04-01 DIAGNOSIS — M25562 Pain in left knee: Secondary | ICD-10-CM | POA: Diagnosis not present

## 2019-04-01 DIAGNOSIS — R2689 Other abnormalities of gait and mobility: Secondary | ICD-10-CM

## 2019-04-01 DIAGNOSIS — M6281 Muscle weakness (generalized): Secondary | ICD-10-CM

## 2019-04-01 DIAGNOSIS — M25662 Stiffness of left knee, not elsewhere classified: Secondary | ICD-10-CM

## 2019-04-01 NOTE — Therapy (Signed)
Jamie [name], [place], [address] Phone: [phone]   Fax:  [phone]  Physical Therapy Treatment  Patient Details  Name: Jamie [name] MRN: [id number] Date of Birth: 01-Nov-1970 Referring Provider (PT): Arther Abbott MD    Encounter Date: 04/01/2019  PT End of Session - 04/01/19 1542    Visit Number  5    Number of Visits  13    Date for PT Re-Evaluation  04/24/19    Authorization Type  Medicaid [place] (12/16-12/29; 6 units approved)    Authorization Time Period  03/13/19-04/24/19    Authorization - Visit Number  5    Authorization - Number of Visits  9    PT Start Time  [id number]   4' on bike, not included wiht charges   PT Stop Time  1616    PT Time Calculation (min)  42 min    Equipment Utilized During Treatment  --   [facility]   Activity Tolerance  Patient tolerated treatment well    Behavior During Therapy  [facility] for tasks assessed/performed       Past Medical History:  Diagnosis Date  . Anxiety   . Arthritis   . Chronic back pain   . Chronic knee pain   . GERD (gastroesophageal reflux disease)    occ  . HCV antibody positive   . Hypertension    "Dr Karie Kirks took me off meds"  diet control  . Lumbar radiculopathy   . Pre-diabetes     Past Surgical History:  Procedure Laterality Date  . BACK SURGERY    . CARPAL TUNNEL RELEASE Right 06/25/2013   Procedure: CARPAL TUNNEL RELEASE;  Surgeon: Carole Civil, MD;  Location: [facility];  Service: Orthopedics;  Laterality: Right;  . CARPAL TUNNEL RELEASE Left 07/25/2013   Procedure: LEFT CARPAL TUNNEL RELEASE;  Surgeon: Carole Civil, MD;  Location: [facility];  Service: Orthopedics;  Laterality: Left;  . COLONOSCOPY WITH PROPOFOL N/A 04/05/2017   Procedure: COLONOSCOPY WITH PROPOFOL;  Surgeon: Daneil Dolin, MD;  [facility];  Service: Endoscopy;  Laterality: N/A;  12:30pm-pt notified to arrive at 9:15am for 10:45am procedure per KF  . ELBOW  SURGERY Left   . FOOT SURGERY Right   . HERNIA REPAIR Right    inguinal- age 59  . KNEE ARTHROSCOPY WITH MEDIAL MENISECTOMY Left 12/07/2016   Procedure: KNEE ARTHROSCOPY WITH MEDIAL MENISECTOMY;  Surgeon: Carole Civil, MD;  Location: [facility];  Service: Orthopedics;  Laterality: Left;  . KNEE SURGERY    . left elbow    . LUMBAR LAMINECTOMY/DECOMPRESSION MICRODISCECTOMY Left 10/06/2013   Procedure: Left Lumbar Three-four microdiskectomy;  Surgeon: Ophelia Charter, MD;  Location: [place] [facility] OR[facility];  Service: Neurosurgery;  Laterality: Left;  Left Lumbar Three-four microdiskectomy  . right foot     forgein body removal  . right knee  orif right patella Keeling 1993  . SEPTOPLASTY    . TOTAL KNEE ARTHROPLASTY Left 02/18/2019   Procedure: TOTAL KNEE ARTHROPLASTY;  Surgeon: Carole Civil, MD;  Location: [facility];  Service: Orthopedics;  Laterality: Left;    There were no vitals filed for this visit.  Subjective Assessment - 04/01/19 1536    Subjective  Pt arrived with SPC, reports knee feels good today just a little swollen.  Pain scale 4/10 for knee.    Patient Stated Goals  To get well    Currently in Pain?  Yes    Pain  Score  4     Pain Location  Knee    Pain Orientation  Left    Pain Descriptors / Indicators  Aching    Pain Type  Surgical pain    Pain Onset  1 to 4 weeks ago    Pain Frequency  Intermittent    Aggravating Factors   Standing, walking, bending    Pain Relieving Factors  pain meds, rest, ice    Effect of Pain on Daily Activities  limits                       [facility] Adult PT Treatment/Exercise - 04/01/19 0001      Ambulation/Gait   Ambulation/Gait  Yes    Ambulation/Gait Assistance  6: Modified independent (Device/Increase time)    Ambulation Distance (Feet)  200 Feet    Assistive device  Straight cane    Gait Pattern  Decreased stance time - left;Decreased step length - left;Decreased hip/knee flexion - left;Antalgic    Ambulation Surface   Level;Indoor    Gait velocity  decreased    Gait Comments  good mechanics, min cueing for stance phase      Knee/Hip Exercises: Stretches   Sports administrator  3 reps;30 seconds    Knee: Self-Stretch to increase Flexion  Left;5 reps;10 seconds    Knee: Self-Stretch Limitations  on 10 inch box    Gastroc Stretch  Both;3 reps;30 seconds    Gastroc Stretch Limitations  slant board    Other Knee/Hip Stretches  Powertower seat 28 10x10" holds for flexion      Knee/Hip Exercises: Aerobic   Recumbent Bike  4 min full revolution seat 19      Knee/Hip Exercises: Standing   Terminal Knee Extension  10 reps    Theraband Level (Terminal Knee Extension)  Level 3 (Green)    Terminal Knee Extension Limitations  5" holds    Lateral Step Up  Left;20 reps;Hand Hold: 1;Step Height: 4"    Forward Step Up  Left;15 reps;Hand Hold: 0;Step Height: 6"    Step Down  Left;15 reps;Hand Hold: 2;Step Height: 4"    Gait Training  200 feet iwth SPC, good mechanics    Other Standing Knee Exercises  sidestep 2RT with GTB      Knee/Hip Exercises: Supine   Knee Extension  Left;AROM    Knee Extension Limitations  0    Knee Flexion  Left;AROM    Knee Flexion Limitations  105      Manual Therapy   Manual Therapy  Edema management    Manual therapy comments  All manuals performed seperate from all other activity     Edema Management  retro message with LT leg elevated for improved fluid return                PT Short Term Goals - 03/13/19 1513      PT SHORT TERM GOAL #1   Title  Patient will be independent with initial HEP to improve functional outcomes    Time  3    Period  Weeks    Status  New    Target Date  04/03/19        PT Long Term Goals - 03/13/19 1514      PT LONG TERM GOAL #1   Title  Patient will have LT knee AROM 0-120 degrees to improve functional mobility and facilitate squatting to pick up items from floor.    Time  6  Period  Weeks    Status  New    Target Date  04/24/19       PT LONG TERM GOAL #2   Title  Patient will be able to maintain tandem stance >30 seconds on BLEs to improve stability and reduce risk for falls    Time  6    Period  Weeks    Status  New    Target Date  04/24/19      PT LONG TERM GOAL #3   Title  Patient will have equal to or > 4+/5 MMT throughout LLE to improve ability to perform functional mobility, stair ambulation and ADLs.    Time  6    Period  Weeks    Status  New    Target Date  04/24/19      PT LONG TERM GOAL #4   Title  Patient will be able to ambulate at least 225 feet during 2MWT with LRAD to demonstrate improved ability to perform functional mobility and associated tasks.    Time  6    Period  Weeks    Status  New    Target Date  04/24/19            Plan - 04/01/19 1902    Clinical Impression Statement  Pt arrived with [facility] and good gait mechanics.  Session focus with knee mobility, hip strengthening and balance training.  Added TKE for quad strengthening and sidestep for balance and hip strengthening.  PT able to demonstrate good form with all exercises and no reports of increased pain.  Added prone quad stretch for knee flexion.  Improved AROM 0-105 degrees (was 1-96 degrees last sessoin.)  EOS with manual retrograde massage for edema and pain control.    Examination-Activity Limitations  Lift;Stand;Locomotion Level;Transfers;Carry;Dressing;Stairs;Squat;Sleep;Sit;Bend    Examination-Participation Restrictions  Yard Work;Cleaning;Community Activity    Stability/Clinical Decision Making  Stable/Uncomplicated    Clinical Decision Making  Low    Rehab Potential  Good    PT Frequency  3x / week    PT Duration  6 weeks    PT Treatment/Interventions  ADLs/Self Care Home Management;Biofeedback;Fluidtherapy;Ultrasound;Moist Heat;Electrical Stimulation;DME Instruction;Gait training;Stair training;Neuromuscular re-education;Functional mobility training;Therapeutic activities;Patient/family education;Manual  techniques;Splinting;Energy conservation;Passive range of motion;Scar mobilization;Vasopneumatic Device;Joint Manipulations;Taping;Compression bandaging;Orthotic Fit/Training;Therapeutic exercise;Balance training;Prosthetic Training    PT Next Visit Plan  Continue to progress LT knee AROM and LLE strength as tolerated. Start with bike for ROM and warmup.    PT Home Exercise Plan  03/13/19: quad set, glute set, heel slide, heel raise; 03/21/19 LAQs, SAQs, ankle pumps, ankle circles.       Patient will benefit from skilled therapeutic intervention in order to improve the following deficits and impairments:  Abnormal gait, Pain, Improper body mechanics, Decreased mobility, Decreased scar mobility, Hypomobility, Decreased strength, Decreased range of motion, Decreased endurance, Decreased activity tolerance, Decreased balance, Difficulty walking, Increased edema, Impaired flexibility  Visit Diagnosis: Other abnormalities of gait and mobility  Acute pain of left knee  Stiffness of left knee, not elsewhere classified  Muscle weakness (generalized)     Problem List Patient Active Problem List   Diagnosis Date Noted  . S/P total knee replacement, left 02/18/2019 03/04/2019  . Osteoarthritis of left knee 02/18/2019  . Effusion of knee joint, left 06/03/2018  . S/P left knee arthroscopy 12/07/16 04/13/2017  . Family history of colon cancer 02/28/2017  . Hepatic cirrhosis ([place]) 02/28/2017  . Derangement of posterior horn of medial meniscus of left knee   . Primary osteoarthritis  of left knee   . Chondromalacia of medial femoral condyle, left   . Hepatitis C 06/15/2016  . Lumbar herniated disc 10/06/2013  . Carpal tunnel syndrome 07/28/2013   Ihor [place], [place]; [place]  Aldona Lento 04/01/2019, 7:06 PM  [place], [place], [address] Phone: [phone]   Fax:  [phone]  Name: Jamie [name] MRN: [id number] Date of Birth: [birth year]

## 2019-04-02 ENCOUNTER — Ambulatory Visit (HOSPITAL_COMMUNITY): Payer: Medicaid Other

## 2019-04-02 ENCOUNTER — Encounter (HOSPITAL_COMMUNITY): Payer: Self-pay

## 2019-04-02 ENCOUNTER — Ambulatory Visit (INDEPENDENT_AMBULATORY_CARE_PROVIDER_SITE_OTHER): Payer: Medicaid Other | Admitting: Orthopedic Surgery

## 2019-04-02 ENCOUNTER — Encounter: Payer: Self-pay | Admitting: Orthopedic Surgery

## 2019-04-02 DIAGNOSIS — M25562 Pain in left knee: Secondary | ICD-10-CM

## 2019-04-02 DIAGNOSIS — R2689 Other abnormalities of gait and mobility: Secondary | ICD-10-CM

## 2019-04-02 DIAGNOSIS — M25662 Stiffness of left knee, not elsewhere classified: Secondary | ICD-10-CM

## 2019-04-02 DIAGNOSIS — M6281 Muscle weakness (generalized): Secondary | ICD-10-CM

## 2019-04-02 DIAGNOSIS — Z96652 Presence of left artificial knee joint: Secondary | ICD-10-CM

## 2019-04-02 MED ORDER — METHOCARBAMOL 500 MG PO TABS: 500.0000 mg | ORAL_TABLET | Freq: Four times a day (QID) | ORAL | 1 refills | Status: DC | PRN

## 2019-04-02 NOTE — Progress Notes (Signed)
Postop visit surgery was on February 18, 2019 left total knee  Patient back in pain management taking hydrocodone for pain would like his Robaxin refilled he can now flex his knee 103 degrees at almost full extension he has no signs of infection  I like to see him in a month  Meds ordered this encounter  Medications  . methocarbamol (ROBAXIN) 500 MG tablet    Sig: Take 1 tablet (500 mg total) by mouth every 6 (six) hours as needed for muscle spasms.    Dispense:  60 tablet    Refill:  1

## 2019-04-02 NOTE — Therapy (Signed)
[place], [place], [address] Phone: [phone]   Fax:  [phone]  Physical Therapy Treatment  Patient Details  Name: Jamie [name] MRN: [id number] Date of Birth: 1970/11/16 Referring Provider (PT): Arther Abbott MD    Encounter Date: 04/02/2019  PT End of Session - 04/02/19 1459    Visit Number  6    Number of Visits  13    Date for PT Re-Evaluation  04/24/19    Authorization Type  Medicaid [place] (12/16-12/29; 6 units approved)    Authorization Time Period  03/13/19-04/24/19    Authorization - Visit Number  6    Authorization - Number of Visits  9    PT Start Time  [id number]   4' on bike, not included wiht charges   PT Stop Time  1528    PT Time Calculation (min)  42 min    Equipment Utilized During Treatment  --   SBA/CGA during balance activities   Activity Tolerance  Patient tolerated treatment well    Behavior During Therapy  [facility] for tasks assessed/performed       Past Medical History:  Diagnosis Date  . Anxiety   . Arthritis   . Chronic back pain   . Chronic knee pain   . GERD (gastroesophageal reflux disease)    occ  . HCV antibody positive   . Hypertension    "Dr Karie Kirks took me off meds"  diet control  . Lumbar radiculopathy   . Pre-diabetes     Past Surgical History:  Procedure Laterality Date  . BACK SURGERY    . CARPAL TUNNEL RELEASE Right 06/25/2013   Procedure: CARPAL TUNNEL RELEASE;  Surgeon: Carole Civil, MD;  [facility];  Service: Orthopedics;  Laterality: Right;  . CARPAL TUNNEL RELEASE Left 07/25/2013   Procedure: LEFT CARPAL TUNNEL RELEASE;  Surgeon: Carole Civil, MD;  Location: [facility];  Service: Orthopedics;  Laterality: Left;  . COLONOSCOPY WITH PROPOFOL N/A 04/05/2017   Procedure: COLONOSCOPY WITH PROPOFOL;  Surgeon: Daneil Dolin, MD;  [facility];  Service: Endoscopy;  Laterality: N/A;  12:30pm-pt notified to arrive at 9:15am for 10:45am  procedure per KF  . ELBOW SURGERY Left   . FOOT SURGERY Right   . HERNIA REPAIR Right    inguinal- age 48  . KNEE ARTHROSCOPY WITH MEDIAL MENISECTOMY Left 12/07/2016   Procedure: KNEE ARTHROSCOPY WITH MEDIAL MENISECTOMY;  Surgeon: Carole Civil, MD;  Location: [facility];  Service: Orthopedics;  Laterality: Left;  . KNEE SURGERY    . left elbow    . LUMBAR LAMINECTOMY/DECOMPRESSION MICRODISCECTOMY Left 10/06/2013   Procedure: Left Lumbar Three-four microdiskectomy;  Surgeon: Ophelia Charter, MD;  Location: [place] [facility];  Service: Neurosurgery;  Laterality: Left;  Left Lumbar Three-four microdiskectomy  . right foot     forgein body removal  . right knee  orif right patella Keeling 1993  . SEPTOPLASTY    . TOTAL KNEE ARTHROPLASTY Left 02/18/2019   Procedure: TOTAL KNEE ARTHROPLASTY;  Surgeon: Carole Civil, MD;  Location: [facility];  Service: Orthopedics;  Laterality: Left;    There were no vitals filed for this visit.  Subjective Assessment - 04/02/19 1456    Subjective  Saw MD earlier, reports he's happy with progress.  Reports some heat around knee, given prescribtion (assumed antibiotics) to address.  Current pain scale 2/10 Lt knee, has taken medication prior session.    Patient Stated Goals  To get well    Currently in Pain?  Yes    Pain Score  2     Pain Location  Knee    Pain Orientation  Left    Pain Descriptors / Indicators  Aching    Pain Onset  1 to 4 weeks ago    Pain Frequency  Intermittent    Aggravating Factors   stanidng, walking, bending    Pain Relieving Factors  pain meds, rest, ice    Effect of Pain on Daily Activities  limits                       [facility] Adult PT Treatment/Exercise - 04/02/19 0001      Ambulation/Gait   Ambulation/Gait  Yes    Ambulation/Gait Assistance  6: Modified independent (Device/Increase time)    Ambulation Distance (Feet)  200 Feet    Assistive device  None    Gait Pattern  Decreased stance time -  left;Decreased step length - left;Decreased hip/knee flexion - left;Antalgic    Ambulation Surface  Level;Indoor    Gait velocity  decreased    Gait Comments  good mechanics, no LOB      Knee/Hip Exercises: Stretches   Knee: Self-Stretch to increase Flexion  Left;5 reps;10 seconds    Knee: Self-Stretch Limitations  on 10 inch box    Other Knee/Hip Stretches  Powertower seat 28 10x10" holds for flexion      Knee/Hip Exercises: Aerobic   Recumbent Bike  4 min full revolution seat 19      Knee/Hip Exercises: Standing   Heel Raises  Both;20 reps    Heel Raises Limitations  20 toe raises incline slope    Terminal Knee Extension  10 reps    Theraband Level (Terminal Knee Extension)  Level 3 (Green)    Terminal Knee Extension Limitations  5" holds    Lateral Step Up  Left;20 reps;Hand Hold: 1;Step Height: 6"    Functional Squat  10 reps    Functional Squat Limitations  front of chair for mechanics    Stairs  3RT 7in step height    Other Standing Knee Exercises  tandem stance on foam 2x 30"    Other Standing Knee Exercises  sidestep 2RT with GTB      Knee/Hip Exercises: Supine   Knee Extension  Left;AROM    Knee Extension Limitations  0    Knee Flexion  Left;AROM    Knee Flexion Limitations  105               PT Short Term Goals - 03/13/19 1513      PT SHORT TERM GOAL #1   Title  Patient will be independent with initial HEP to improve functional outcomes    Time  3    Period  Weeks    Status  New    Target Date  04/03/19        PT Long Term Goals - 03/13/19 1514      PT LONG TERM GOAL #1   Title  Patient will have LT knee AROM 0-120 degrees to improve functional mobility and facilitate squatting to pick up items from floor.    Time  6    Period  Weeks    Status  New    Target Date  04/24/19      PT LONG TERM GOAL #2   Title  Patient will be able to maintain tandem stance >30 seconds on BLEs  to improve stability and reduce risk for falls    Time  6    Period   Weeks    Status  New    Target Date  04/24/19      PT LONG TERM GOAL #3   Title  Patient will have equal to or > 4+/5 MMT throughout LLE to improve ability to perform functional mobility, stair ambulation and ADLs.    Time  6    Period  Weeks    Status  New    Target Date  04/24/19      PT LONG TERM GOAL #4   Title  Patient will be able to ambulate at least 225 feet during 2MWT with LRAD to demonstrate improved ability to perform functional mobility and associated tasks.    Time  6    Period  Weeks    Status  New    Target Date  04/24/19            Plan - 04/02/19 1536    Clinical Impression Statement  Continued with knee flexion stretches and progressed functional strengthening this session.  Progressed gait training to no AD, pt able to demonstrate good balance and gait mechanics iwht no LOB episodes through session.  Continued wiht powertower prolonged flexion for quad stretches and knee drive on [id number] step.  Progressed functional strengthening with addition of squats and began reciprocal pattern stair training.  Pt progressing well with functional strengthening, still needs to address knee flexion stretches next session.    Examination-Activity Limitations  Lift;Stand;Locomotion Level;Transfers;Carry;Dressing;Stairs;Squat;Sleep;Sit;Bend    Examination-Participation Restrictions  Yard Work;Cleaning;Community Activity    Stability/Clinical Decision Making  Stable/Uncomplicated    Clinical Decision Making  Low    Rehab Potential  Good    PT Frequency  3x / week    PT Duration  6 weeks    PT Treatment/Interventions  ADLs/Self Care Home Management;Biofeedback;Fluidtherapy;Ultrasound;Moist Heat;Electrical Stimulation;DME Instruction;Gait training;Stair training;Neuromuscular re-education;Functional mobility training;Therapeutic activities;Patient/family education;Manual techniques;Splinting;Energy conservation;Passive range of motion;Scar mobilization;Vasopneumatic Device;Joint  Manipulations;Taping;Compression bandaging;Orthotic Fit/Training;Therapeutic exercise;Balance training;Prosthetic Training    PT Next Visit Plan  Continue to progress Lt knee flexion and LE strengthening as tolerated.    PT Home Exercise Plan  03/13/19: quad set, glute set, heel slide, heel raise; 03/21/19 LAQs, SAQs, ankle pumps, ankle circles.       Patient will benefit from skilled therapeutic intervention in order to improve the following deficits and impairments:  Abnormal gait, Pain, Improper body mechanics, Decreased mobility, Decreased scar mobility, Hypomobility, Decreased strength, Decreased range of motion, Decreased endurance, Decreased activity tolerance, Decreased balance, Difficulty walking, Increased edema, Impaired flexibility  Visit Diagnosis: Other abnormalities of gait and mobility  Acute pain of left knee  Stiffness of left knee, not elsewhere classified  Muscle weakness (generalized)     Problem List Patient Active Problem List   Diagnosis Date Noted  . S/P total knee replacement, left 02/18/2019 03/04/2019  . Osteoarthritis of left knee 02/18/2019  . Effusion of knee joint, left 06/03/2018  . S/P left knee arthroscopy 12/07/16 04/13/2017  . Family history of colon cancer 02/28/2017  . Hepatic cirrhosis ([place]) 02/28/2017  . Derangement of posterior horn of medial meniscus of left knee   . Primary osteoarthritis of left knee   . Chondromalacia of medial femoral condyle, left   . Hepatitis C 06/15/2016  . Lumbar herniated disc 10/06/2013  . Carpal tunnel syndrome 07/28/2013   Ihor Austin, LPTA; [place]  Aldona Lento 04/02/2019, [place]  [employer] [place], [place], [address] Phone: [phone]   Fax:  [phone]  Name: Jamie [name] MRN: [id number] Date of Birth: 1970/05/23

## 2019-04-07 ENCOUNTER — Ambulatory Visit (HOSPITAL_COMMUNITY): Payer: Medicaid Other | Admitting: Physical Therapy

## 2019-04-07 ENCOUNTER — Other Ambulatory Visit: Payer: Self-pay

## 2019-04-07 ENCOUNTER — Encounter (HOSPITAL_COMMUNITY): Payer: Self-pay | Admitting: Physical Therapy

## 2019-04-07 DIAGNOSIS — R2689 Other abnormalities of gait and mobility: Secondary | ICD-10-CM

## 2019-04-07 DIAGNOSIS — M6281 Muscle weakness (generalized): Secondary | ICD-10-CM

## 2019-04-07 DIAGNOSIS — M25662 Stiffness of left knee, not elsewhere classified: Secondary | ICD-10-CM

## 2019-04-07 DIAGNOSIS — M25562 Pain in left knee: Secondary | ICD-10-CM | POA: Diagnosis not present

## 2019-04-07 NOTE — Therapy (Signed)
[facility] [address] [place], [place], [address] Phone: [phone]   Fax:  [phone]  Physical Therapy Treatment/ Progress Note  Patient Details  Name: Jamie [name] MRN: [id number] Date of Birth: [birth year] Referring Provider (PT): Arther Abbott MD    Encounter Date: 04/07/2019   Progress Note Reporting Period 03/26/19 to 04/07/19  See note below for Objective Data and Assessment of Progress/Goals.       PT End of Session - 04/07/19 1655    Visit Number  7    Number of Visits  13    Date for PT Re-Evaluation  04/25/19   Progress Note done 12/28   Authorization Type  Medicaid [place] (12/16-12/29) Submitted for 6 visits starting 04/11/19    Authorization Time Period  03/13/19-04/25/19    Authorization - Visit Number  7    Authorization - Number of Visits  9    PT Start Time  5427    PT Stop Time  1728    PT Time Calculation (min)  40 min    Equipment Utilized During Treatment  --   SBA/CGA during balance activities   Activity Tolerance  Patient tolerated treatment well    Behavior During Therapy  WFL for tasks assessed/performed       Past Medical History:  Diagnosis Date  . Anxiety   . Arthritis   . Chronic back pain   . Chronic knee pain   . GERD (gastroesophageal reflux disease)    occ  . HCV antibody positive   . Hypertension    "Dr Karie Kirks took me off meds"  diet control  . Lumbar radiculopathy   . Pre-diabetes     Past Surgical History:  Procedure Laterality Date  . BACK SURGERY    . CARPAL TUNNEL RELEASE Right 06/25/2013   Procedure: CARPAL TUNNEL RELEASE;  Surgeon: Carole Civil, MD;  [facility];  Service: Orthopedics;  Laterality: Right;  . CARPAL TUNNEL RELEASE Left 07/25/2013   Procedure: LEFT CARPAL TUNNEL RELEASE;  Surgeon: Carole Civil, MD;  Location: [facility];  Service: Orthopedics;  Laterality: Left;  . COLONOSCOPY WITH PROPOFOL N/A 04/05/2017   Procedure: COLONOSCOPY  WITH PROPOFOL;  Surgeon: Daneil Dolin, MD;  [facility];  Service: Endoscopy;  Laterality: N/A;  12:30pm-pt notified to arrive at 9:15am for 10:45am procedure per KF  . ELBOW SURGERY Left   . FOOT SURGERY Right   . HERNIA REPAIR Right    inguinal- age 64  . KNEE ARTHROSCOPY WITH MEDIAL MENISECTOMY Left 12/07/2016   Procedure: KNEE ARTHROSCOPY WITH MEDIAL MENISECTOMY;  Surgeon: Carole Civil, MD;  Location: [facility];  Service: Orthopedics;  Laterality: Left;  . KNEE SURGERY    . left elbow    . LUMBAR LAMINECTOMY/DECOMPRESSION MICRODISCECTOMY Left 10/06/2013   Procedure: Left Lumbar Three-four microdiskectomy;  Surgeon: Ophelia Charter, MD;  Location: [place] [facility];  Service: Neurosurgery;  Laterality: Left;  Left Lumbar Three-four microdiskectomy  . right foot     forgein body removal  . right knee  orif right patella Keeling 1993  . SEPTOPLASTY    . TOTAL KNEE ARTHROPLASTY Left 02/18/2019   Procedure: TOTAL KNEE ARTHROPLASTY;  Surgeon: Carole Civil, MD;  Location: [facility];  Service: Orthopedics;  Laterality: Left;    There were no vitals filed for this visit.  Subjective Assessment - 04/07/19 1652    Subjective  Patient says knee is sore over the last few days. Patient  says he has been doing more walking and standing lately which may have caused increased swelling and soreness in knee.    Patient Stated Goals  To get well    Currently in Pain?  Yes    Pain Score  5     Pain Location  Knee    Pain Orientation  Left    Pain Descriptors / Indicators  Aching    Pain Type  Surgical pain    Pain Onset  More than a month ago    Pain Frequency  Intermittent         [facility] PT Assessment - 04/07/19 0001      Assessment   Medical Diagnosis  LT TKA    Referring Provider (PT)  Arther Abbott MD     Onset Date/Surgical Date  02/18/19    Prior Therapy  No      Precautions   Precautions  Fall      Restrictions   Weight Bearing Restrictions  No      [place] residence      Prior Function   Level of Independence  Independent with community mobility with device      Cognition   Overall Cognitive Status  Within Functional Limits for tasks assessed      AROM   Left Knee Extension  0    Left Knee Flexion  109      Strength   Right Hip Flexion  5/5    Right Hip Extension  5/5    Right Hip ABduction  5/5    Left Hip Flexion  4+/5   was 4   Left Hip Extension  4/5    Left Hip ABduction  4+/5   was 4-   Right Knee Flexion  5/5    Right Knee Extension  5/5    Left Knee Flexion  5/5   was 4-   Left Knee Extension  5/5   was 3-   Right Ankle Dorsiflexion  5/5    Left Ankle Dorsiflexion  4+/5      Ambulation/Gait   Gait Comments  2MWT                   [facility] Adult PT Treatment/Exercise - 04/07/19 0001      Ambulation/Gait   Ambulation/Gait  Yes    Ambulation/Gait Assistance  7: Independent    Ambulation Distance (Feet)  455 Feet    Assistive device  None    Gait Pattern  Decreased stance time - left    Ambulation Surface  Level;Indoor      Knee/Hip Exercises: Stretches   Knee: Self-Stretch to increase Flexion  Left;5 reps;10 seconds    Knee: Self-Stretch Limitations  on 10 inch box    Gastroc Stretch  Both;3 reps;30 seconds    Gastroc Stretch Limitations  slant board    Other Knee/Hip Stretches  Prone quad stretch with rope, 3 x 30"      Knee/Hip Exercises: Aerobic   Recumbent Bike  4 min warm up for ROM       Knee/Hip Exercises: Standing   Terminal Knee Extension  15 reps    Theraband Level (Terminal Knee Extension)  Other (comment)    Terminal Knee Extension Limitations  purple     Forward Step Up  Left;15 reps;Step Height: 6";Hand Hold: 1    Step Down  Left;15 reps;Hand Hold: 2;Step Height: 6"    Functional Squat  2  sets;10 reps    Functional Squat Limitations  front of chair for mechanics    Other Standing Knee Exercises  tandem stance on foam 2x 30"       Knee/Hip Exercises: Supine   Knee Extension  Left;AROM    Knee Extension Limitations  0    Knee Flexion  Left;AROM    Knee Flexion Limitations  109               PT Short Term Goals - 04/07/19 1731      PT SHORT TERM GOAL #1   Title  Patient will be independent with initial HEP to improve functional outcomes    Time  3    Period  Weeks    Status  Achieved    Target Date  04/03/19        PT Long Term Goals - 04/07/19 1732      PT LONG TERM GOAL #1   Title  Patient will have LT knee AROM 0-120 degrees to improve functional mobility and facilitate squatting to pick up items from floor.    Time  6    Period  Weeks    Status  On-going      PT LONG TERM GOAL #2   Title  Patient will be able to maintain tandem stance >30 seconds on BLEs to improve stability and reduce risk for falls    Time  6    Period  Weeks    Status  Achieved      PT LONG TERM GOAL #3   Title  Patient will have equal to or > 4+/5 MMT throughout LLE to improve ability to perform functional mobility, stair ambulation and ADLs.    Baseline  Met, except LT hip ext    Time  6    Period  Weeks    Status  Partially Met      PT LONG TERM GOAL #4   Title  Patient will be able to ambulate at least 225 feet during 2MWT with LRAD to demonstrate improved ability to perform functional mobility and associated tasks.    Time  6    Period  Weeks    Status  Achieved            Plan - 04/07/19 1739    Clinical Impression Statement  Patient is making good progress to LTGs and has partially met therapy goals at this time. Patient is doing very well with gait training and functional mobility tasks, but continues to have, mild limitation in LT hip strength and LT knee AROM, which are likely contributing to ongoing pain and limiting ADLs. Patient will continue to benefit from skilled therapy services to address remaining deficits to reduce pain and improve level of function with ADLs, functional mobility tasks and  reduce risk for falls.    Examination-Activity Limitations  Lift;Stand;Locomotion Level;Transfers;Carry;Dressing;Stairs;Squat;Sleep;Sit;Bend    Examination-Participation Restrictions  Yard Work;Cleaning;Community Activity    Stability/Clinical Decision Making  Stable/Uncomplicated    Rehab Potential  Good    PT Frequency  3x / week    PT Duration  3 weeks    PT Treatment/Interventions  ADLs/Self Care Home Management;Biofeedback;Fluidtherapy;Ultrasound;Moist Heat;Electrical Stimulation;DME Instruction;Gait training;Stair training;Neuromuscular re-education;Functional mobility training;Therapeutic activities;Patient/family education;Manual techniques;Splinting;Energy conservation;Passive range of motion;Scar mobilization;Vasopneumatic Device;Joint Manipulations;Taping;Compression bandaging;Orthotic Fit/Training;Therapeutic exercise;Balance training;Prosthetic Training    PT Next Visit Plan  Progress LT hip strength, balance, gait and stair training.    PT Home Exercise Plan  03/13/19: quad set, glute set, heel slide, heel raise;  03/21/19 LAQs, SAQs, ankle pumps, ankle circles.    Consulted and Agree with Plan of Care  Patient       Patient will benefit from skilled therapeutic intervention in order to improve the following deficits and impairments:  Abnormal gait, Pain, Improper body mechanics, Decreased mobility, Decreased scar mobility, Hypomobility, Decreased strength, Decreased range of motion, Decreased endurance, Decreased activity tolerance, Decreased balance, Difficulty walking, Increased edema, Impaired flexibility  Visit Diagnosis: Other abnormalities of gait and mobility  Acute pain of left knee  Stiffness of left knee, not elsewhere classified  Muscle weakness (generalized)     Problem List Patient Active Problem List   Diagnosis Date Noted  . S/P total knee replacement, left 02/18/2019 03/04/2019  . Osteoarthritis of left knee 02/18/2019  . Effusion of knee joint, left  06/03/2018  . S/P left knee arthroscopy 12/07/16 04/13/2017  . Family history of colon cancer 02/28/2017  . Hepatic cirrhosis ([place]) 02/28/2017  . Derangement of posterior horn of medial meniscus of left knee   . Primary osteoarthritis of left knee   . Chondromalacia of medial femoral condyle, left   . Hepatitis C 06/15/2016  . Lumbar herniated disc 10/06/2013  . Carpal tunnel syndrome 07/28/2013    5:47 PM, 04/07/19 Josue Hector PT DPT  Physical Therapist with [facility]  [phone] [place] [address] [place], [place], [address] Phone: [phone]   Fax:  [phone]  Name: LATASHA PUSKAS MRN: [id number] Date of Birth: [birth year]

## 2019-04-09 ENCOUNTER — Ambulatory Visit (HOSPITAL_COMMUNITY): Payer: Medicaid Other

## 2019-04-10 ENCOUNTER — Encounter (HOSPITAL_COMMUNITY): Payer: Medicaid Other | Admitting: Physical Therapy

## 2019-04-14 ENCOUNTER — Ambulatory Visit (HOSPITAL_COMMUNITY): Payer: Medicaid Other | Admitting: Physical Therapy

## 2019-04-14 ENCOUNTER — Telehealth (HOSPITAL_COMMUNITY): Payer: Self-pay | Admitting: Physical Therapy

## 2019-04-14 NOTE — Telephone Encounter (Signed)
pt cancelled for today because of abd pain and diarrhea

## 2019-04-16 ENCOUNTER — Encounter (HOSPITAL_COMMUNITY): Payer: Self-pay | Admitting: Physical Therapy

## 2019-04-16 ENCOUNTER — Other Ambulatory Visit: Payer: Self-pay

## 2019-04-16 ENCOUNTER — Ambulatory Visit (HOSPITAL_COMMUNITY): Payer: Medicaid Other | Attending: Orthopedic Surgery | Admitting: Physical Therapy

## 2019-04-16 DIAGNOSIS — R2689 Other abnormalities of gait and mobility: Secondary | ICD-10-CM | POA: Diagnosis not present

## 2019-04-16 DIAGNOSIS — M6281 Muscle weakness (generalized): Secondary | ICD-10-CM | POA: Diagnosis present

## 2019-04-16 DIAGNOSIS — M25562 Pain in left knee: Secondary | ICD-10-CM | POA: Diagnosis present

## 2019-04-16 DIAGNOSIS — M25662 Stiffness of left knee, not elsewhere classified: Secondary | ICD-10-CM | POA: Diagnosis present

## 2019-04-16 NOTE — Therapy (Signed)
[place], [place], [address] Phone: [phone]   Fax:  [phone]  Physical Therapy Treatment  Patient Details  Name: LAJARVIS ITALIANO MRN: [id number] Date of Birth: Jan 05, 1971 Referring Provider (PT): Arther Abbott MD    Encounter Date: 04/16/2019  PT End of Session - 04/16/19 1408    Visit Number  8    Number of Visits  13    Date for PT Re-Evaluation  04/25/19   Progress Note done 12/28   Authorization Type  Medicaid [place] (12/16-12/29) Submitted for remaining 3 visits on 04/16/19, to begin 04/21/19    Authorization Time Period  03/13/19-04/25/19    Authorization - Visit Number  8    Authorization - Number of Visits  9    PT Start Time  6256    PT Stop Time  1443    PT Time Calculation (min)  40 min    Equipment Utilized During Treatment  --   SBA/CGA during balance activities   Activity Tolerance  Patient tolerated treatment well;Patient limited by pain    Behavior During Therapy  [facility] for tasks assessed/performed       Past Medical History:  Diagnosis Date  . Anxiety   . Arthritis   . Chronic back pain   . Chronic knee pain   . GERD (gastroesophageal reflux disease)    occ  . HCV antibody positive   . Hypertension    "Dr Karie Kirks took me off meds"  diet control  . Lumbar radiculopathy   . Pre-diabetes     Past Surgical History:  Procedure Laterality Date  . BACK SURGERY    . CARPAL TUNNEL RELEASE Right 06/25/2013   Procedure: CARPAL TUNNEL RELEASE;  Surgeon: Carole Civil, MD;  Location: [facility];  Service: Orthopedics;  Laterality: Right;  . CARPAL TUNNEL RELEASE Left 07/25/2013   Procedure: LEFT CARPAL TUNNEL RELEASE;  Surgeon: Carole Civil, MD;  Location: [facility];  Service: Orthopedics;  Laterality: Left;  . COLONOSCOPY WITH PROPOFOL N/A 04/05/2017   Procedure: COLONOSCOPY WITH PROPOFOL;  Surgeon: Daneil Dolin, MD;  [facility];  Service: Endoscopy;  Laterality: N/A;   12:30pm-pt notified to arrive at 9:15am for 10:45am procedure per KF  . ELBOW SURGERY Left   . FOOT SURGERY Right   . HERNIA REPAIR Right    inguinal- age 78  . KNEE ARTHROSCOPY WITH MEDIAL MENISECTOMY Left 12/07/2016   Procedure: KNEE ARTHROSCOPY WITH MEDIAL MENISECTOMY;  Surgeon: Carole Civil, MD;  Location: [facility];  Service: Orthopedics;  Laterality: Left;  . KNEE SURGERY    . left elbow    . LUMBAR LAMINECTOMY/DECOMPRESSION MICRODISCECTOMY Left 10/06/2013   Procedure: Left Lumbar Three-four microdiskectomy;  Surgeon: Ophelia Charter, MD;  Location: [place] [facility];  Service: Neurosurgery;  Laterality: Left;  Left Lumbar Three-four microdiskectomy  . right foot     forgein body removal  . right knee  orif right patella Ke[place] 1993  . SEPTOPLASTY    . TOTAL KNEE ARTHROPLASTY Left 02/18/2019   Procedure: TOTAL KNEE ARTHROPLASTY;  Surgeon: Carole Civil, MD;  Location: [facility];  Service: Orthopedics;  Laterality: Left;    There were no vitals filed for this visit.  Subjective Assessment - 04/16/19 1409    Subjective  Patient says his leg has been "jumping" and that knee is hurting today, is unsure why.    Patient Stated Goals  To get well    Currently in  Pain?  Yes    Pain Score  7     Pain Location  Knee    Pain Orientation  Left    Pain Descriptors / Indicators  Aching;Constant    Pain Type  Surgical pain    Pain Onset  More than a month ago    Pain Frequency  Constant                       [facility] Adult PT Treatment/Exercise - 04/16/19 0001      Knee/Hip Exercises: Stretches   Knee: Self-Stretch to increase Flexion  Left;5 reps;10 seconds    Knee: Self-Stretch Limitations  on 10 inch box    Gastroc Stretch  Both;3 reps;30 seconds    Gastroc Stretch Limitations  slant board      Knee/Hip Exercises: Aerobic   Recumbent Bike  4 min warm up for ROM       Knee/Hip Exercises: Standing   Heel Raises  Both;20 reps    Terminal Knee Extension  15 reps     Theraband Level (Terminal Knee Extension)  Other (comment)    Terminal Knee Extension Limitations  purple     Stairs  4RT 7in step height, single hand rail, reciprocal gait     Gait Training  226 feet with SPC, cues for heel strike on LT    Other Standing Knee Exercises  tandem stance on foam 3x 30"    Other Standing Knee Exercises  --      Knee/Hip Exercises: Supine   Knee Extension  Left;AROM    Knee Extension Limitations  0    Knee Flexion  Left;AROM    Knee Flexion Limitations  115      Manual Therapy   Manual Therapy  Joint mobilization;Myofascial release;Soft tissue mobilization    Manual therapy comments  All manuals performed seperate from all other activity     Joint Mobilization  Grade II patellar mobs in all planes for improved mobility     Soft tissue mobilization  IASTM to distal quad for pain and improved mobility    Myofascial Release  Transverse friciton message to scar tissue for pain and mobility                PT Short Term Goals - 04/07/19 1731      PT SHORT TERM GOAL #1   Title  Patient will be independent with initial HEP to improve functional outcomes    Time  3    Period  Weeks    Status  Achieved    Target Date  04/03/19        PT Long Term Goals - 04/07/19 1732      PT LONG TERM GOAL #1   Title  Patient will have LT knee AROM 0-120 degrees to improve functional mobility and facilitate squatting to pick up items from floor.    Time  6    Period  Weeks    Status  On-going      PT LONG TERM GOAL #2   Title  Patient will be able to maintain tandem stance >30 seconds on BLEs to improve stability and reduce risk for falls    Time  6    Period  Weeks    Status  Achieved      PT LONG TERM GOAL #3   Title  Patient will have equal to or > 4+/5 MMT throughout LLE to improve ability to perform functional mobility, stair  ambulation and ADLs.    Baseline  Met, except LT hip ext    Time  6    Period  Weeks    Status  Partially Met      PT  LONG TERM GOAL #4   Title  Patient will be able to ambulate at least 225 feet during 2MWT with LRAD to demonstrate improved ability to perform functional mobility and associated tasks.    Time  6    Period  Weeks    Status  Achieved            Plan - 04/16/19 1502    Clinical Impression Statement  Patient limited by increased pain at today's session. Patient denies change in activity level. Ther ex graded per patient tolerance, and select exercise held today per flowsheet. Added manual treatment to address knee pain and muscle and tissue restriction. Patient did note pain reduction from 7/10 to 5/10 post treatment. Patient ambulated using SPC, but required verbal cueing for heel strike with LT step. Despite increased pain level, patient LT knee AROM progressing well.    Examination-Activity Limitations  Lift;Stand;Locomotion Level;Transfers;Carry;Dressing;Stairs;Squat;Sleep;Sit;Bend    Examination-Participation Restrictions  Yard Work;Cleaning;Community Activity    Stability/Clinical Decision Making  Stable/Uncomplicated    Rehab Potential  Good    PT Frequency  3x / week    PT Duration  3 weeks    PT Treatment/Interventions  ADLs/Self Care Home Management;Biofeedback;Fluidtherapy;Ultrasound;Moist Heat;Electrical Stimulation;DME Instruction;Gait training;Stair training;Neuromuscular re-education;Functional mobility training;Therapeutic activities;Patient/family education;Manual techniques;Splinting;Energy conservation;Passive range of motion;Scar mobilization;Vasopneumatic Device;Joint Manipulations;Taping;Compression bandaging;Orthotic Fit/Training;Therapeutic exercise;Balance training;Prosthetic Training    PT Next Visit Plan  Resume ther ex as tolerated. Continue to progress gait and funcitonal strenghtening as able.    PT Home Exercise Plan  03/13/19: quad set, glute set, heel slide, heel raise; 03/21/19 LAQs, SAQs, ankle pumps, ankle circles.    Consulted and Agree with Plan of Care   Patient       Patient will benefit from skilled therapeutic intervention in order to improve the following deficits and impairments:  Abnormal gait, Pain, Improper body mechanics, Decreased mobility, Decreased scar mobility, Hypomobility, Decreased strength, Decreased range of motion, Decreased endurance, Decreased activity tolerance, Decreased balance, Difficulty walking, Increased edema, Impaired flexibility  Visit Diagnosis: Other abnormalities of gait and mobility  Acute pain of left knee  Stiffness of left knee, not elsewhere classified  Muscle weakness (generalized)     Problem List Patient Active Problem List   Diagnosis Date Noted  . S/P total knee replacement, left 02/18/2019 03/04/2019  . Osteoarthritis of left knee 02/18/2019  . Effusion of knee joint, left 06/03/2018  . S/P left knee arthroscopy 12/07/16 04/13/2017  . Family history of colon cancer 02/28/2017  . Hepatic cirrhosis ([place]) 02/28/2017  . Derangement of posterior horn of medial meniscus of left knee   . Primary osteoarthritis of left knee   . Chondromalacia of medial femoral condyle, left   . Hepatitis C 06/15/2016  . Lumbar herniated disc 10/06/2013  . Carpal tunnel syndrome 07/28/2013   3:25 PM, 04/16/19 Josue Hector PT DPT  Physical Therapist with [facility]  [phone] [place] [address] [place], [place], [address] Phone: [phone]   Fax:  [phone]  Name: ONEILL BAIS MRN: [id number] Date of Birth: January 08, 1971

## 2019-04-18 ENCOUNTER — Other Ambulatory Visit: Payer: Self-pay

## 2019-04-18 ENCOUNTER — Ambulatory Visit (HOSPITAL_COMMUNITY): Payer: Medicaid Other | Admitting: Physical Therapy

## 2019-04-18 DIAGNOSIS — M25662 Stiffness of left knee, not elsewhere classified: Secondary | ICD-10-CM

## 2019-04-18 DIAGNOSIS — R2689 Other abnormalities of gait and mobility: Secondary | ICD-10-CM | POA: Diagnosis not present

## 2019-04-18 DIAGNOSIS — M6281 Muscle weakness (generalized): Secondary | ICD-10-CM

## 2019-04-18 DIAGNOSIS — M25562 Pain in left knee: Secondary | ICD-10-CM

## 2019-04-18 NOTE — Therapy (Signed)
[place], [place], [address] Phone: [phone]   Fax:  [phone]  Physical Therapy Treatment  Patient Details  Name: Jamie [name] MRN: [id number] Date of Birth: 15-Jan-1971 Referring Provider (PT): Arther Abbott MD    Encounter Date: 04/18/2019  PT End of Session - 04/18/19 1647    Visit Number  9    Number of Visits  13    Date for PT Re-Evaluation  04/25/19   Progress Note done 12/28   Authorization Type  Medicaid [place] (12/16-12/29) Submitted for remaining 3 visits on 04/16/19, to begin 04/21/19    Authorization Time Period  03/13/19-04/25/19    Authorization - Visit Number  9    Authorization - Number of Visits  9    PT Start Time  0802    PT Stop Time  1525    PT Time Calculation (min)  40 min    Equipment Utilized During Treatment  --   SBA/CGA during balance activities   Activity Tolerance  Patient tolerated treatment well;Patient limited by pain    Behavior During Therapy  [facility] for tasks assessed/performed       Past Medical History:  Diagnosis Date  . Anxiety   . Arthritis   . Chronic back pain   . Chronic knee pain   . GERD (gastroesophageal reflux disease)    occ  . HCV antibody positive   . Hypertension    "Dr Karie Kirks took me off meds"  diet control  . Lumbar radiculopathy   . Pre-diabetes     Past Surgical History:  Procedure Laterality Date  . BACK SURGERY    . CARPAL TUNNEL RELEASE Right 06/25/2013   Procedure: CARPAL TUNNEL RELEASE;  Surgeon: Carole Civil, MD;  Location: [facility];  Service: Orthopedics;  Laterality: Right;  . CARPAL TUNNEL RELEASE Left 07/25/2013   Procedure: LEFT CARPAL TUNNEL RELEASE;  Surgeon: Carole Civil, MD;  Location: [facility];  Service: Orthopedics;  Laterality: Left;  . COLONOSCOPY WITH PROPOFOL N/A 04/05/2017   Procedure: COLONOSCOPY WITH PROPOFOL;  Surgeon: Daneil Dolin, MD;  [facility];  Service: Endoscopy;  Laterality: N/A;   12:30pm-pt notified to arrive at 9:15am for 10:45am procedure per KF  . ELBOW SURGERY Left   . FOOT SURGERY Right   . HERNIA REPAIR Right    inguinal- age 57  . KNEE ARTHROSCOPY WITH MEDIAL MENISECTOMY Left 12/07/2016   Procedure: KNEE ARTHROSCOPY WITH MEDIAL MENISECTOMY;  Surgeon: Carole Civil, MD;  [facility];  Service: Orthopedics;  Laterality: Left;  . KNEE SURGERY    . left elbow    . LUMBAR LAMINECTOMY/DECOMPRESSION MICRODISCECTOMY Left 10/06/2013   Procedure: Left Lumbar Three-four microdiskectomy;  Surgeon: Ophelia Charter, MD;  Location: [place] [facility];  Service: Neurosurgery;  Laterality: Left;  Left Lumbar Three-four microdiskectomy  . right foot     forgein body removal  . right knee  orif right patella Keeling 1993  . SEPTOPLASTY    . TOTAL KNEE ARTHROPLASTY Left 02/18/2019   Procedure: TOTAL KNEE ARTHROPLASTY;  Surgeon: Carole Civil, MD;  Location[facility];  Service: Orthopedics;  Laterality: Left;    There were no vitals filed for this visit.  Subjective Assessment - 04/18/19 1453    Subjective  pt states his knee is sore today at 5/10.    Currently in Pain?  No/denies  [place] Adult PT Treatment/Exercise - 04/18/19 0001      Knee/Hip Exercises: Stretches   Knee: Self-Stretch to increase Flexion  Left;5 reps;10 seconds    Gastroc Stretch  Both;3 reps;30 seconds    Gastroc Stretch Limitations  slant board      Knee/Hip Exercises: Aerobic   Recumbent Bike  4 min warm up for ROM       Knee/Hip Exercises: Standing   Heel Raises  Both;20 reps    Heel Raises Limitations  20 toe raises incline slope    Forward Step Up  Left;15 reps;Step Height: 6";Hand Hold: 1    Step Down  Left;15 reps;Hand Hold: 2;Step Height: 6"    SLS  max  Lt and Rt:30" each with noted fatigue in Rt LE      Knee/Hip Exercises: Seated   Sit to Sand  2 sets;10 reps;without UE support      Knee/Hip Exercises: Supine   Knee Extension  Left;AROM     Knee Extension Limitations  0    Knee Flexion  Left;AROM    Knee Flexion Limitations  120      Manual Therapy   Manual Therapy  Joint mobilization;Myofascial release;Soft tissue mobilization    Manual therapy comments  All manual performed seperate from all other activity     Joint Mobilization  Grade II patellar mobs in all planes for improved mobility     Soft tissue mobilization  IASTM to distal quad for pain and improved mobility    Myofascial Release  to reduce adhesions               PT Short Term Goals - 04/07/19 1731      PT SHORT TERM GOAL #1   Title  Patient will be independent with initial HEP to improve functional outcomes    Time  3    Period  Weeks    Status  Achieved    Target Date  04/03/19        PT Long Term Goals - 04/07/19 1732      PT LONG TERM GOAL #1   Title  Patient will have LT knee AROM 0-120 degrees to improve functional mobility and facilitate squatting to pick up items from floor.    Time  6    Period  Weeks    Status  On-going      PT LONG TERM GOAL #2   Title  Patient will be able to maintain tandem stance >30 seconds on BLEs to improve stability and reduce risk for falls    Time  6    Period  Weeks    Status  Achieved      PT LONG TERM GOAL #3   Title  Patient will have equal to or > 4+/5 MMT throughout LLE to improve ability to perform functional mobility, stair ambulation and ADLs.    Baseline  Met, except LT hip ext    Time  6    Period  Weeks    Status  Partially Met      PT LONG TERM GOAL #4   Title  Patient will be able to ambulate at least 225 feet during 2MWT with LRAD to demonstrate improved ability to perform functional mobility and associated tasks.    Time  6    Period  Weeks    Status  Achieved            Plan - 04/18/19 1644    Clinical Impression Statement  contiued  to focus on improving functional strength and reducing pain.  Added SLS today with ability to complete full 30" with each LE without UE  assist.  Rt is notably weaker with this activity than the Lt LE.   continued with manual to Lt knee with minimal restrictions noted today.  Able to achieve 120 degrees of flexion this session and overall reduction in pain level and at end of session.    Examination-Activity Limitations  Lift;Stand;Locomotion Level;Transfers;Carry;Dressing;Stairs;Squat;Sleep;Sit;Bend    Examination-Participation Restrictions  Yard Work;Cleaning;Community Activity    Stability/Clinical Decision Making  Stable/Uncomplicated    Rehab Potential  Good    PT Frequency  3x / week    PT Duration  3 weeks    PT Treatment/Interventions  ADLs/Self Care Home Management;Biofeedback;Fluidtherapy;Ultrasound;Moist Heat;Electrical Stimulation;DME Instruction;Gait training;Stair training;Neuromuscular re-education;Functional mobility training;Therapeutic activities;Patient/family education;Manual techniques;Splinting;Energy conservation;Passive range of motion;Scar mobilization;Vasopneumatic Device;Joint Manipulations;Taping;Compression bandaging;Orthotic Fit/Training;Therapeutic exercise;Balance training;Prosthetic Training    PT Next Visit Plan  continue with ther ex as tolerated. Continue to progress gait and funcitonal strenghtening as able.    PT Home Exercise Plan  03/13/19: quad set, glute set, heel slide, heel raise; 03/21/19 LAQs, SAQs, ankle pumps, ankle circles.    Consulted and Agree with Plan of Care  Patient       Patient will benefit from skilled therapeutic intervention in order to improve the following deficits and impairments:  Abnormal gait, Pain, Improper body mechanics, Decreased mobility, Decreased scar mobility, Hypomobility, Decreased strength, Decreased range of motion, Decreased endurance, Decreased activity tolerance, Decreased balance, Difficulty walking, Increased edema, Impaired flexibility  Visit Diagnosis: Other abnormalities of gait and mobility  Acute pain of left knee  Stiffness of left knee, not  elsewhere classified  Muscle weakness (generalized)     Problem List Patient Active Problem List   Diagnosis Date Noted  . S/P total knee replacement, left 02/18/2019 03/04/2019  . Osteoarthritis of left knee 02/18/2019  . Effusion of knee joint, left 06/03/2018  . S/P left knee arthroscopy 12/07/16 04/13/2017  . Family history of colon cancer 02/28/2017  . Hepatic cirrhosis ([place]) 02/28/2017  . Derangement of posterior horn of medial meniscus of left knee   . Primary osteoarthritis of left knee   . Chondromalacia of medial femoral condyle, left   . Hepatitis C 06/15/2016  . Lumbar herniated disc 10/06/2013  . Carpal tunnel syndrome 07/28/2013   Teena Irani, PTA/CLT [phone]  Teena Irani 04/18/2019, 4:48 PM  [place] [address] [place], [place], [address] Phone: [phone]   Fax:  [phone]  Name: Jamie [name] MRN: [id number] Date of Birth: 1971/03/23

## 2019-04-21 ENCOUNTER — Encounter (HOSPITAL_COMMUNITY): Payer: Self-pay | Admitting: Physical Therapy

## 2019-04-21 ENCOUNTER — Other Ambulatory Visit: Payer: Self-pay

## 2019-04-21 ENCOUNTER — Ambulatory Visit (HOSPITAL_COMMUNITY): Payer: Medicaid Other | Admitting: Physical Therapy

## 2019-04-21 DIAGNOSIS — R2689 Other abnormalities of gait and mobility: Secondary | ICD-10-CM | POA: Diagnosis not present

## 2019-04-21 DIAGNOSIS — M25662 Stiffness of left knee, not elsewhere classified: Secondary | ICD-10-CM

## 2019-04-21 DIAGNOSIS — M25562 Pain in left knee: Secondary | ICD-10-CM

## 2019-04-21 DIAGNOSIS — M6281 Muscle weakness (generalized): Secondary | ICD-10-CM

## 2019-04-21 NOTE — Therapy (Signed)
[place], [place], [address] Phone: [phone]   Fax:  [phone]  Physical Therapy Treatment  Patient Details  Name: Jamie [name] MRN: [id number] Date of Birth: 09/13/1970 Referring Provider (PT): Arther Abbott MD    Encounter Date: 04/21/2019  PT End of Session - 04/21/19 1453    Visit Number  10    Number of Visits  13    Date for PT Re-Evaluation  04/25/19   Progress Note done 12/28   Authorization Type  Medicaid [place] (12/16-12/29)  3 visits approved 1/11-1/17    Authorization Time Period  03/13/19-04/25/19    Authorization - Visit Number  1    Authorization - Number of Visits  3    PT Start Time  9774   patient arrived late   PT Stop Time  1515    PT Time Calculation (min)  30 min    Equipment Utilized During Treatment     Activity Tolerance  Patient tolerated treatment well    Behavior During Therapy  [facility] for tasks assessed/performed       Past Medical History:  Diagnosis Date  . Anxiety   . Arthritis   . Chronic back pain   . Chronic knee pain   . GERD (gastroesophageal reflux disease)    occ  . HCV antibody positive   . Hypertension    "Dr Karie Kirks took me off meds"  diet control  . Lumbar radiculopathy   . Pre-diabetes     Past Surgical History:  Procedure Laterality Date  . BACK SURGERY    . CARPAL TUNNEL RELEASE Right 06/25/2013   Procedure: CARPAL TUNNEL RELEASE;  Surgeon: Carole Civil, MD;  Location: [facility];  Service: Orthopedics;  Laterality: Right;  . CARPAL TUNNEL RELEASE Left 07/25/2013   Procedure: LEFT CARPAL TUNNEL RELEASE;  Surgeon: Carole Civil, MD;  Location: [facility];  Service: Orthopedics;  Laterality: Left;  . COLONOSCOPY WITH PROPOFOL N/A 04/05/2017   Procedure: COLONOSCOPY WITH PROPOFOL;  Surgeon: Daneil Dolin, MD;  [facility]: [facility];  Service: Endoscopy;  Laterality: N/A;  12:30pm-pt notified to arrive at 9:15am for 10:45am procedure per KF   . ELBOW SURGERY Left   . FOOT SURGERY Right   . HERNIA REPAIR Right    inguinal- age 60  . KNEE ARTHROSCOPY WITH MEDIAL MENISECTOMY Left 12/07/2016   Procedure: KNEE ARTHROSCOPY WITH MEDIAL MENISECTOMY;  Surgeon: Carole Civil, MD;  Location: [facility];  Service: Orthopedics;  Laterality: Left;  . KNEE SURGERY    . left elbow    . LUMBAR LAMINECTOMY/DECOMPRESSION MICRODISCECTOMY Left 10/06/2013   Procedure: Left Lumbar Three-four microdiskectomy;  Surgeon: Ophelia Charter, MD;  Location: [place] [facility];  Service: Neurosurgery;  Laterality: Left;  Left Lumbar Three-four microdiskectomy  . right foot     forgein body removal  . right knee  orif right patella Keeling 1993  . SEPTOPLASTY    . TOTAL KNEE ARTHROPLASTY Left 02/18/2019   Procedure: TOTAL KNEE ARTHROPLASTY;  Surgeon: Carole Civil, MD;  Location: [facility];  Service: Orthopedics;  Laterality: Left;    There were no vitals filed for this visit.  Subjective Assessment - 04/21/19 1450    Subjective  Patient says he is doing a lot better today, been doing exercises and more walking around home. Patient says he feels tight in just one spot yet.    Currently in Pain?  Yes    Pain Score  4     Pain Location  Knee    Pain Orientation  Left;Anterior;Medial    Pain Descriptors / Indicators  Aching;Tightness    Pain Type  Surgical pain    Pain Onset  More than a month ago    Pain Frequency  Constant                       [facility] Adult PT Treatment/Exercise - 04/21/19 0001      Knee/Hip Exercises: Stretches   Knee: Self-Stretch to increase Flexion  Left;5 reps;10 seconds    Knee: Self-Stretch Limitations  on 10 inch box    Gastroc Stretch  Both;3 reps;30 seconds    Gastroc Stretch Limitations  slant board      Knee/Hip Exercises: Aerobic   Recumbent Bike  4 min warm up for ROM       Knee/Hip Exercises: Machines for Strengthening   Cybex Leg Press  40#, 2 x 10       Knee/Hip Exercises: Standing    Functional Squat  2 sets;10 reps    Functional Squat Limitations  front of chair for mechanics    Stairs  5RT 7in step height, no rail, reciprocal gait     SLS  30 sec rounds x 3, both, solid floor    Gait Training  452 feet with no AD, good return, slight decrease in DF upone LLE heel strike    Other Standing Knee Exercises  tandem stance on foam 3x 30"      Knee/Hip Exercises: Supine   Knee Extension  Left;AROM    Knee Extension Limitations  0    Knee Flexion  Left;AROM    Knee Flexion Limitations  120               PT Short Term Goals - 04/07/19 1731      PT SHORT TERM GOAL #1   Title  Patient will be independent with initial HEP to improve functional outcomes    Time  3    Period  Weeks    Status  Achieved    Target Date  04/03/19        PT Long Term Goals - 04/07/19 1732      PT LONG TERM GOAL #1   Title  Patient will have LT knee AROM 0-120 degrees to improve functional mobility and facilitate squatting to pick up items from floor.    Time  6    Period  Weeks    Status  On-going      PT LONG TERM GOAL #2   Title  Patient will be able to maintain tandem stance >30 seconds on BLEs to improve stability and reduce risk for falls    Time  6    Period  Weeks    Status  Achieved      PT LONG TERM GOAL #3   Title  Patient will have equal to or > 4+/5 MMT throughout LLE to improve ability to perform functional mobility, stair ambulation and ADLs.    Baseline  Met, except LT hip ext    Time  6    Period  Weeks    Status  Partially Met      PT LONG TERM GOAL #4   Title  Patient will be able to ambulate at least 225 feet during 2MWT with LRAD to demonstrate improved ability to perform functional mobility and associated tasks.    Time  6  Period  Weeks    Status  Achieved            Plan - 04/21/19 1513    Clinical Impression Statement  Patient making very good progress to LTGs. Patient able to progress single limb stance time today, as well as  progress stair ambulation to reciprocal gait with no hand rail. Added machine leg press. Patient educated on proper form and function. Patient with improved gait today, just slight decrease in LT DF noted at heel strike. Patient reported decreased pain post session. Educated patient on POC for final week of therapy, will progress strength exercises next visits, then transition to [place] for DC on Friday.    Examination-Activity Limitations  Lift;Stand;Locomotion Level;Transfers;Carry;Dressing;Stairs;Squat;Sleep;Sit;Bend    Examination-Participation Restrictions  Yard Work;Cleaning;Community Activity    Stability/Clinical Decision Making  Stable/Uncomplicated    Rehab Potential  Good    PT Frequency  3x / week    PT Duration  3 weeks    PT Treatment/Interventions  ADLs/Self Care Home Management;Biofeedback;Fluidtherapy;Ultrasound;Moist Heat;Electrical Stimulation;DME Instruction;Gait training;Stair training;Neuromuscular re-education;Functional mobility training;Therapeutic activities;Patient/family education;Manual techniques;Splinting;Energy conservation;Passive range of motion;Scar mobilization;Vasopneumatic Device;Joint Manipulations;Taping;Compression bandaging;Orthotic Fit/Training;Therapeutic exercise;Balance training;Prosthetic Training    PT Next Visit Plan  Progress strengthening, increase band resistance for sidesteps, increase weight for leg press next visits. Add step over hurdles.    PT Home Exercise Plan  03/13/19: quad set, glute set, heel slide, heel raise; 03/21/19 LAQs, SAQs, ankle pumps, ankle circles.    Consulted and Agree with Plan of Care  Patient       Patient will benefit from skilled therapeutic intervention in order to improve the following deficits and impairments:  Abnormal gait, Pain, Improper body mechanics, Decreased mobility, Decreased scar mobility, Hypomobility, Decreased strength, Decreased range of motion, Decreased endurance, Decreased activity tolerance, Decreased  balance, Difficulty walking, Increased edema, Impaired flexibility  Visit Diagnosis: Other abnormalities of gait and mobility  Acute pain of left knee  Stiffness of left knee, not elsewhere classified  Muscle weakness (generalized)     Problem List Patient Active Problem List   Diagnosis Date Noted  . S/P total knee replacement, left 02/18/2019 03/04/2019  . Osteoarthritis of left knee 02/18/2019  . Effusion of knee joint, left 06/03/2018  . S/P left knee arthroscopy 12/07/16 04/13/2017  . Family history of colon cancer 02/28/2017  . Hepatic cirrhosis ([place]) 02/28/2017  . Derangement of posterior horn of medial meniscus of left knee   . Primary osteoarthritis of left knee   . Chondromalacia of medial femoral condyle, left   . Hepatitis C 06/15/2016  . Lumbar herniated disc 10/06/2013  . Carpal tunnel syndrome 07/28/2013   3:22 PM, 04/21/19 Josue Hector PT DPT  Physical Therapist with [facility]  [phone]   [facility] [address] [place], [place], [address] Phone: [phone]   Fax:  [phone]  Name: Jamie [name] MRN: [id number] Date of Birth: 1970/07/19

## 2019-04-23 ENCOUNTER — Other Ambulatory Visit: Payer: Self-pay

## 2019-04-23 ENCOUNTER — Encounter (HOSPITAL_COMMUNITY): Payer: Self-pay | Admitting: Physical Therapy

## 2019-04-23 ENCOUNTER — Ambulatory Visit (HOSPITAL_COMMUNITY): Payer: Medicaid Other | Admitting: Physical Therapy

## 2019-04-23 DIAGNOSIS — M25562 Pain in left knee: Secondary | ICD-10-CM

## 2019-04-23 DIAGNOSIS — M25662 Stiffness of left knee, not elsewhere classified: Secondary | ICD-10-CM

## 2019-04-23 DIAGNOSIS — M6281 Muscle weakness (generalized): Secondary | ICD-10-CM

## 2019-04-23 DIAGNOSIS — R2689 Other abnormalities of gait and mobility: Secondary | ICD-10-CM | POA: Diagnosis not present

## 2019-04-23 NOTE — Therapy (Signed)
[place], [place], [address] Phone: [phone]   Fax:  [phone]  Physical Therapy Treatment  Patient Details  Name: Jamie [name] MRN: [id number] Date of Birth: 11-25-1970 Referring Provider (PT): Arther Abbott MD    Encounter Date: 04/23/2019  PT End of Session - 04/23/19 1502    Visit Number  11    Number of Visits  13    Date for PT Re-Evaluation  04/25/19   Progress Note done 12/28   Authorization Type  Medicaid [place] (12/16-12/29)  3 visits approved 1/11-1/17    Authorization Time Period  03/13/19-04/25/19    Authorization - Visit Number  2    Authorization - Number of Visits  3    PT Start Time  9622    PT Stop Time  1526    PT Time Calculation (min)  41 min    Equipment Utilized During Treatment  --   SBA/CGA during balance activities   Activity Tolerance  Patient tolerated treatment well    Behavior During Therapy  [facility] for tasks assessed/performed       Past Medical History:  Diagnosis Date  . Anxiety   . Arthritis   . Chronic back pain   . Chronic knee pain   . GERD (gastroesophageal reflux disease)    occ  . HCV antibody positive   . Hypertension    "Dr Karie Kirks took me off meds"  diet control  . Lumbar radiculopathy   . Pre-diabetes     Past Surgical History:  Procedure Laterality Date  . BACK SURGERY    . CARPAL TUNNEL RELEASE Right 06/25/2013   Procedure: CARPAL TUNNEL RELEASE;  Surgeon: Carole Civil, MD;  [facility]: [facility];  Service: Orthopedics;  Laterality: Right;  . CARPAL TUNNEL RELEASE Left 07/25/2013   Procedure: LEFT CARPAL TUNNEL RELEASE;  Surgeon: Carole Civil, MD;  Location: [facility];  Service: Orthopedics;  Laterality: Left;  . COLONOSCOPY WITH PROPOFOL N/A 04/05/2017   Procedure: COLONOSCOPY WITH PROPOFOL;  Surgeon: Daneil Dolin, MD;  [facility];  Service: Endoscopy;  Laterality: N/A;  12:30pm-pt notified to arrive at 9:15am for 10:45am  procedure per KF  . ELBOW SURGERY Left   . FOOT SURGERY Right   . HERNIA REPAIR Right    inguinal- age 42  . KNEE ARTHROSCOPY WITH MEDIAL MENISECTOMY Left 12/07/2016   Procedure: KNEE ARTHROSCOPY WITH MEDIAL MENISECTOMY;  Surgeon: Carole Civil, MD;  Location[facility];  Service: Orthopedics;  Laterality: Left;  . KNEE SURGERY    . left elbow    . LUMBAR LAMINECTOMY/DECOMPRESSION MICRODISCECTOMY Left 10/06/2013   Procedure: Left Lumbar Three-four microdiskectomy;  Surgeon: Ophelia Charter, MD;  Location: [place] [facility];  Service: Neurosurgery;  Laterality: Left;  Left Lumbar Three-four microdiskectomy  . right foot     forgein body removal  . right knee  orif right patella Keeling 1993  . SEPTOPLASTY    . TOTAL KNEE ARTHROPLASTY Left 02/18/2019   Procedure: TOTAL KNEE ARTHROPLASTY;  Surgeon: Carole Civil, MD;  Location[facility];  Service: Orthopedics;  Laterality: Left;    There were no vitals filed for this visit.  Subjective Assessment - 04/23/19 1450    Subjective  Patient reported feeling good today and having minimal pain which he rated as a 3/10.    Currently in Pain?  Yes    Pain Score  3     Pain Location  Knee  Pain Orientation  Left    Pain Descriptors / Indicators  Aching    Pain Type  Surgical pain    Pain Onset  More than a month ago                       [facility] Adult PT Treatment/Exercise - 04/23/19 0001      Knee/Hip Exercises: Stretches   Knee: Self-Stretch to increase Flexion  Left;10 seconds    Knee: Self-Stretch Limitations  on 12 inch box   10x   Gastroc Stretch  Both;3 reps;30 seconds    Gastroc Stretch Limitations  slant board      Knee/Hip Exercises: Aerobic   Recumbent Bike  3 min warm up for ROM       Knee/Hip Exercises: Machines for Strengthening   Cybex Leg Press  50#, 2 x 10       Knee/Hip Exercises: Standing   Functional Squat  2 sets;10 reps    Functional Squat Limitations  front of chair for mechanics    Stairs   5RT 7in step height, no rail, reciprocal gait     SLS  30 sec rounds x 3, both, solid floor    Gait Training  (4) 6'' hurdle forward and sidestepping x 4 RT    Other Standing Knee Exercises  tandem stance on foam 4x 30"    Other Standing Knee Exercises  Sidestepping GTB 15 feet x 4 roundtrips               PT Short Term Goals - 04/07/19 1731      PT SHORT TERM GOAL #1   Title  Patient will be independent with initial HEP to improve functional outcomes    Time  3    Period  Weeks    Status  Achieved    Target Date  04/03/19        PT Long Term Goals - 04/07/19 1732      PT LONG TERM GOAL #1   Title  Patient will have LT knee AROM 0-120 degrees to improve functional mobility and facilitate squatting to pick up items from floor.    Time  6    Period  Weeks    Status  On-going      PT LONG TERM GOAL #2   Title  Patient will be able to maintain tandem stance >30 seconds on BLEs to improve stability and reduce risk for falls    Time  6    Period  Weeks    Status  Achieved      PT LONG TERM GOAL #3   Title  Patient will have equal to or > 4+/5 MMT throughout LLE to improve ability to perform functional mobility, stair ambulation and ADLs.    Baseline  Met, except LT hip ext    Time  6    Period  Weeks    Status  Partially Met      PT LONG TERM GOAL #4   Title  Patient will be able to ambulate at least 225 feet during 2MWT with LRAD to demonstrate improved ability to perform functional mobility and associated tasks.    Time  6    Period  Weeks    Status  Achieved            Plan - 04/23/19 1541    Clinical Impression Statement  Patient continued to demonstrate good form with exercises this session and good strength. Added ambulation  over hurdles this session with minimal cueing for form. Added weighted ambulation for strengthening and control. Patient to return for 1 more visit for re-assessment and update of HEP.    Examination-Activity Limitations   Lift;Stand;Locomotion Level;Transfers;Carry;Dressing;Stairs;Squat;Sleep;Sit;Bend    Examination-Participation Restrictions  Yard Work;Cleaning;Community Activity    Stability/Clinical Decision Making  Stable/Uncomplicated    Rehab Potential  Good    PT Frequency  3x / week    PT Duration  3 weeks    PT Treatment/Interventions  ADLs/Self Care Home Management;Biofeedback;Fluidtherapy;Ultrasound;Moist Heat;Electrical Stimulation;DME Instruction;Gait training;Stair training;Neuromuscular re-education;Functional mobility training;Therapeutic activities;Patient/family education;Manual techniques;Splinting;Energy conservation;Passive range of motion;Scar mobilization;Vasopneumatic Device;Joint Manipulations;Taping;Compression bandaging;Orthotic Fit/Training;Therapeutic exercise;Balance training;Prosthetic Training    PT Next Visit Plan  Check goals, update HEP, plan to discharge    PT Home Exercise Plan  03/13/19: quad set, glute set, heel slide, heel raise; 03/21/19 LAQs, SAQs, ankle pumps, ankle circles.    Consulted and Agree with Plan of Care  Patient       Patient will benefit from skilled therapeutic intervention in order to improve the following deficits and impairments:  Abnormal gait, Pain, Improper body mechanics, Decreased mobility, Decreased scar mobility, Hypomobility, Decreased strength, Decreased range of motion, Decreased endurance, Decreased activity tolerance, Decreased balance, Difficulty walking, Increased edema, Impaired flexibility  Visit Diagnosis: Other abnormalities of gait and mobility  Acute pain of left knee  Stiffness of left knee, not elsewhere classified  Muscle weakness (generalized)     Problem List Patient Active Problem List   Diagnosis Date Noted  . S/P total knee replacement, left 02/18/2019 03/04/2019  . Osteoarthritis of left knee 02/18/2019  . Effusion of knee joint, left 06/03/2018  . S/P left knee arthroscopy 12/07/16 04/13/2017  . Family history of  colon cancer 02/28/2017  . Hepatic cirrhosis ([place]) 02/28/2017  . Derangement of posterior horn of medial meniscus of left knee   . Primary osteoarthritis of left knee   . Chondromalacia of medial femoral condyle, left   . Hepatitis C 06/15/2016  . Lumbar herniated disc 10/06/2013  . Carpal tunnel syndrome 07/28/2013   Clarene Critchley PT, DPT 3:42 PM, 04/23/19 [place] [address] [place], [place], [address] Phone: [phone]   Fax:  [phone]  Name: Jamie [name] MRN: [id number] Date of Birth: Dec 18, 1970

## 2019-04-25 ENCOUNTER — Ambulatory Visit (HOSPITAL_COMMUNITY): Payer: Medicaid Other

## 2019-04-25 ENCOUNTER — Encounter (HOSPITAL_COMMUNITY): Payer: Self-pay

## 2019-04-25 ENCOUNTER — Other Ambulatory Visit: Payer: Self-pay

## 2019-04-25 DIAGNOSIS — M25662 Stiffness of left knee, not elsewhere classified: Secondary | ICD-10-CM

## 2019-04-25 DIAGNOSIS — M25562 Pain in left knee: Secondary | ICD-10-CM

## 2019-04-25 DIAGNOSIS — M6281 Muscle weakness (generalized): Secondary | ICD-10-CM

## 2019-04-25 DIAGNOSIS — R2689 Other abnormalities of gait and mobility: Secondary | ICD-10-CM | POA: Diagnosis not present

## 2019-04-25 NOTE — Therapy (Addendum)
[facility] [address] [place], [place], [address] Phone: [phone]   Fax:  [phone]  Physical Therapy Treatment/ Discharge Summary  Patient Details  Name: Jamie [name] MRN: [id number] Date of Birth: [birth year] Referring Provider (PT): Arther Abbott MD    Encounter Date: 04/25/2019   PHYSICAL THERAPY DISCHARGE SUMMARY  Visits from Start of Care: 12  Current functional level related to goals / functional outcomes: See below   Remaining deficits: See below   Education / Equipment: See assessment  Plan: Patient agrees to discharge.  Patient goals were met. Patient is being discharged due to meeting the stated rehab goals.  ?????       PT End of Session - 04/25/19 0956    Visit Number  12    Number of Visits  13    Date for PT Re-Evaluation  04/25/19    Authorization Type  Medicaid [place] (12/16-12/29)  3 visits approved 1/11-1/17    Authorization Time Period  03/13/19-04/25/19    Authorization - Visit Number  3    Authorization - Number of Visits  3    PT Start Time  0918    PT Stop Time  0948    PT Time Calculation (min)  30 min    Activity Tolerance  Patient tolerated treatment well    Behavior During Therapy  [facility] for tasks assessed/performed       Past Medical History:  Diagnosis Date  . Anxiety   . Arthritis   . Chronic back pain   . Chronic knee pain   . GERD (gastroesophageal reflux disease)    occ  . HCV antibody positive   . Hypertension    "Dr Karie Kirks took me off meds"  diet control  . Lumbar radiculopathy   . Pre-diabetes     Past Surgical History:  Procedure Laterality Date  . BACK SURGERY    . CARPAL TUNNEL RELEASE Right 06/25/2013   Procedure: CARPAL TUNNEL RELEASE;  Surgeon: Carole Civil, MD;  Location: [facility];  Service: Orthopedics;  Laterality: Right;  . CARPAL TUNNEL RELEASE Left 07/25/2013   Procedure: LEFT CARPAL TUNNEL RELEASE;  Surgeon: Carole Civil, MD;  Location:  [facility];  Service: Orthopedics;  Laterality: Left;  . COLONOSCOPY WITH PROPOFOL N/A 04/05/2017   Procedure: COLONOSCOPY WITH PROPOFOL;  Surgeon: Daneil Dolin, MD;  Location: [facility];  Service: Endoscopy;  Laterality: N/A;  12:30pm-pt notified to arrive at 9:15am for 10:45am procedure per KF  . ELBOW SURGERY Left   . FOOT SURGERY Right   . HERNIA REPAIR Right    inguinal- age 40  . KNEE ARTHROSCOPY WITH MEDIAL MENISECTOMY Left 12/07/2016   Procedure: KNEE ARTHROSCOPY WITH MEDIAL MENISECTOMY;  Surgeon: Carole Civil, MD;  Location: [facility];  Service: Orthopedics;  Laterality: Left;  . KNEE SURGERY    . left elbow    . LUMBAR LAMINECTOMY/DECOMPRESSION MICRODISCECTOMY Left 10/06/2013   Procedure: Left Lumbar Three-four microdiskectomy;  Surgeon: Ophelia Charter, MD;  Location: [facility] ORS;  Service: Neurosurgery;  Laterality: Left;  Left Lumbar Three-four microdiskectomy  . right foot     forgein body removal  . right knee  orif right patella [place] 1993  . SEPTOPLASTY    . TOTAL KNEE ARTHROPLASTY Left 02/18/2019   Procedure: TOTAL KNEE ARTHROPLASTY;  Surgeon: Carole Civil, MD;  Location: [facility];  Service: Orthopedics;  Laterality: Left;    There were no vitals filed for this visit.  Subjective Assessment - 04/25/19 0920    Subjective  Pt arrived with SPC, reports he had a slip on step yesterday, no fall.  Reports he has occasional dizziness and feel his balance is off a little today.  Increased swelling today following slip on step.    How long can you sit comfortably?  15 minutes    How long can you stand comfortably?  10 minutes    How long can you walk comfortably?  <5 minutes    Patient Stated Goals  To get well    Currently in Pain?  Yes    Pain Score  5     Pain Location  Knee    Pain Orientation  Left    Pain Descriptors / Indicators  Throbbing;Aching    Pain Type  Surgical pain    Pain Onset  More than a month ago    Pain Frequency  Constant     Aggravating Factors   standing, walking, bending    Pain Relieving Factors  pain meds, rest, ice    Effect of Pain on Daily Activities  limits         [facility] PT Assessment - 04/25/19 0001      Assessment   Medical Diagnosis  LT TKA    Referring Provider (PT)  Arther Abbott MD     Onset Date/Surgical Date  02/18/19    Next MD Visit  05/02/19    Prior Therapy  No      Precautions   Precautions  Fall      AROM   Left Knee Extension  0    Left Knee Flexion  120   was 109 04/07/19     Strength   Right Hip Flexion  5/5    Right Hip Extension  5/5    Right Hip ABduction  5/5    Left Hip Flexion  5/5   was 4+   Left Hip Extension  4+/5   was 4/5   Left Hip ABduction  4+/5   was 4+/5   Right Knee Flexion  5/5    Right Knee Extension  5/5    Left Knee Flexion  5/5    Left Knee Extension  5/5    Right Ankle Dorsiflexion  5/5    Left Ankle Dorsiflexion  4+/5   was 4+/5     Ambulation/Gait   Ambulation/Gait  Yes    Ambulation/Gait Assistance  7: Independent    Ambulation Distance (Feet)  474 Feet   was 455 ft   Assistive device  None    Ambulation Surface  Level;Indoor    Stairs  Yes    Stairs Assistance  7: Independent    Stair Management Technique  No rails;Alternating pattern    Number of Stairs  12    Height of Stairs  7    Gait Comments  2MWT      Static Standing Balance   Static Standing Balance -  Activities   Single Leg Stance - Right Leg;Single Leg Stance - Left Leg;Tandam Stance - Right Leg;Tandam Stance - Left Leg    Static Standing - Comment/# of Minutes  Lt 25", Rt 31"   was 15 sec; unable; 8 sec, mod sway; 10 sec, mod sway                  [facility] Adult PT Treatment/Exercise - 04/25/19 0001      Knee/Hip Exercises: Standing   Forward Lunges  Both;10 reps  Forward Lunges Limitations  cueing for mechanics    Functional Squat  2 sets;10 reps    Functional Squat Limitations  front of chair for mechanics    Stairs  5RT 7in step height, no  rail, reciprocal gait     SLS  Lt 25", Rt 31"                PT Short Term Goals - 04/25/19 0924      PT SHORT TERM GOAL #1   Title  Patient will be independent with initial HEP to improve functional outcomes    Baseline  04/25/19: Reports compliance wiht HEP daily    Status  Achieved        PT Long Term Goals - 04/25/19 0950      PT LONG TERM GOAL #1   Title  Patient will have LT knee AROM 0-120 degrees to improve functional mobility and facilitate squatting to pick up items from floor.    Baseline  04/25/19: AROM 0-120 degrees      PT LONG TERM GOAL #2   Title  Patient will be able to maintain tandem stance >30 seconds on BLEs to improve stability and reduce risk for falls    Status  Achieved      PT LONG TERM GOAL #3   Title  Patient will have equal to or > 4+/5 MMT throughout LLE to improve ability to perform functional mobility, stair ambulation and ADLs.    Status  Achieved      PT LONG TERM GOAL #4   Title  Patient will be able to ambulate at least 225 feet during 2MWT with LRAD to demonstrate improved ability to perform functional mobility and associated tasks.    Baseline  04/25/19: 474 feet no AD, proper gait mechanics    Status  Achieved            Plan - 04/25/19 0952    Clinical Impression Statement  Reviewed goals with all STG and LTGs met.  AROM 0-120.  Reports compliance with HEP, reviewed exercises with updated HEP established and education to assure proper form with some cueing for weight bearing wiht squats and lunges.  Pt now ambulating no AD, increased distance during 2MWT.  Balance has improved as well.  Following reports of slip on stairs included balance exercises as part of HEP.    Examination-Activity Limitations  Lift;Stand;Locomotion Level;Transfers;Carry;Dressing;Stairs;Squat;Sleep;Sit;Bend    Examination-Participation Restrictions  Yard Work;Cleaning;Community Activity    Stability/Clinical Decision Making  Stable/Uncomplicated     Clinical Decision Making  Low    Rehab Potential  Good    PT Frequency  DC   PT Duration     PT Treatment/Interventions  ADLs/Self Care Home Management;Biofeedback;Fluidtherapy;Ultrasound;Moist Heat;Electrical Stimulation;DME Instruction;Gait training;Stair training;Neuromuscular re-education;Functional mobility training;Therapeutic activities;Patient/family education;Manual techniques;Splinting;Energy conservation;Passive range of motion;Scar mobilization;Vasopneumatic Device;Joint Manipulations;Taping;Compression bandaging;Orthotic Fit/Training;Therapeutic exercise;Balance training;Prosthetic Training    PT Next Visit Plan  DC to HEP per all goals met.    PT Home Exercise Plan  03/13/19: quad set, glute set, heel slide, heel raise; 03/21/19 LAQs, SAQs, ankle pumps, ankle circles. 04/25/19: SLS, tandem stance, lunges and squats       Patient will benefit from skilled therapeutic intervention in order to improve the following deficits and impairments:  Abnormal gait, Pain, Improper body mechanics, Decreased mobility, Decreased scar mobility, Hypomobility, Decreased strength, Decreased range of motion, Decreased endurance, Decreased activity tolerance, Decreased balance, Difficulty walking, Increased edema, Impaired flexibility  Visit Diagnosis: Other abnormalities of gait and mobility  Acute pain of left knee  Stiffness of left knee, not elsewhere classified  Muscle weakness (generalized)     Problem List Patient Active Problem List   Diagnosis Date Noted  . S/P total knee replacement, left 02/18/2019 03/04/2019  . Osteoarthritis of left knee 02/18/2019  . Effusion of knee joint, left 06/03/2018  . S/P left knee arthroscopy 12/07/16 04/13/2017  . Family history of colon cancer 02/28/2017  . Hepatic cirrhosis ([place]) 02/28/2017  . Derangement of posterior horn of medial meniscus of left knee   . Primary osteoarthritis of left knee   . Chondromalacia of medial femoral condyle, left   .  Hepatitis C 06/15/2016  . Lumbar herniated disc 10/06/2013  . Carpal tunnel syndrome 07/28/2013   Ihor Austin, [place]; [place]  Aldona Lento 04/25/2019, 9:58 AM   8:22 AM, 04/28/19 Josue Hector PT DPT  Physical Therapist with [facility]  [phone] [place] [address] [place], [place], [address] Phone: [phone]   Fax:  [phone]  Name: Jamie [name] MRN: [id number] Date of Birth: January 13, 1971

## 2019-04-25 NOTE — Patient Instructions (Signed)
SINGLE LIMB STANCE    Stance: single leg on floor. Raise leg. Hold 30 seconds. Repeat with other leg. 5  reps per set, 1 sets per day, everyday.  Copyright  VHI. All rights reserved.   Tandem Stance    Right foot in front of left, heel touching toe both feet "straight ahead". Stand on Foot Triangle of Support with both feet. Balance in this position 30 seconds. Do with left foot in front of right.  Copyright  VHI. All rights reserved.   Forward Lunge    Standing with feet shoulder width apart and stomach tight, step forward with left leg. Repeat 10 times per set. Do 2 sets per session. Do 4 sessions per week.  http://orth.exer.us/1146   Copyright  VHI. All rights reserved.   FUNCTIONAL MOBILITY: Squat    Keep knees behind toes and pressure on heels. Stance: shoulder-width on floor. Bend hips and knees. Keep back straight. Do not allow knees to bend past toes.  Squeeze glutes and quads to stand. 10 reps per set, 1sets per day, 4 days per week  Copyright  VHI. All rights reserved.

## 2019-05-02 ENCOUNTER — Other Ambulatory Visit: Payer: Self-pay

## 2019-05-02 ENCOUNTER — Ambulatory Visit: Payer: Medicaid Other | Admitting: Orthopedic Surgery

## 2019-05-02 VITALS — BP 116/62 | HR 67 | Temp 98.1°F | Ht 71.0 in | Wt 233.0 lb

## 2019-05-02 DIAGNOSIS — Z96652 Presence of left artificial knee joint: Secondary | ICD-10-CM

## 2019-05-02 NOTE — Progress Notes (Signed)
Postop visit Chief Complaint  Patient presents with  . Follow-up    Recheck on left knee, DOS 02-18-19.    Status post left total knee postop week number 7-day #73  Patient doing well he is getting pain medicine from [facility] 10 mg of Norco saw them yesterday although he is not having a lot of pain  He is using a cane occasionally  His knee looks good no effusion range of motion 115 degrees  Recommend continue with exercises strengthening and follow-up 5 weeks  Encounter Diagnosis  Name Primary?  . S/P total knee replacement, left 02/18/2019 Yes    .

## 2019-05-21 ENCOUNTER — Telehealth: Payer: Self-pay | Admitting: Orthopedic Surgery

## 2019-05-21 NOTE — Telephone Encounter (Signed)
Thank you, why did you send this, is there a question? Please clarify.

## 2019-05-21 NOTE — Telephone Encounter (Signed)
Jamie [name] called around 11:30 stating his knee was bothering him a little more.   He said his mother is being discharged from the hospital today so he couldn't come today.  I told him that Dr. Aline Brochure is not going to be here later this week.  He said he and his sister both were going to be taking care of their mother at home and so he wanted to wait until Monday afternoon to see Dr. Aline Brochure.  Appointment give for Monday afternoon at 3:40.

## 2019-05-26 ENCOUNTER — Encounter: Payer: Self-pay | Admitting: Orthopedic Surgery

## 2019-05-26 ENCOUNTER — Ambulatory Visit (INDEPENDENT_AMBULATORY_CARE_PROVIDER_SITE_OTHER): Payer: Medicaid Other | Admitting: Orthopedic Surgery

## 2019-05-26 ENCOUNTER — Other Ambulatory Visit: Payer: Self-pay

## 2019-05-26 VITALS — BP 134/86 | HR 66 | Ht 71.0 in | Wt 230.0 lb

## 2019-05-26 DIAGNOSIS — Z4889 Encounter for other specified surgical aftercare: Secondary | ICD-10-CM

## 2019-05-26 DIAGNOSIS — Z96652 Presence of left artificial knee joint: Secondary | ICD-10-CM

## 2019-05-26 NOTE — Progress Notes (Signed)
Chief Complaint  Patient presents with  . Post-op Follow-up    02/18/19 left knee replacement was painful swollen better now     3 months into the total knee the patient had some pain and swelling may be related to lifting up his mother who fractured her hip and the family decided to keep her at home seems to be getting better now  Exam shows his incision healed nicely his knee flexion is 115 degrees is a little bit lack of extension knee feels stable no signs of infection  Recommend changing his appointment from February 26 to March 24 do not see anything going on out of the ordinary at this time  Encounter Diagnoses  Name Primary?  . S/P total knee replacement, left 02/18/2019 Yes  . Aftercare following surgery

## 2019-05-26 NOTE — Patient Instructions (Signed)
Change appointment to March 24

## 2019-06-06 ENCOUNTER — Ambulatory Visit: Payer: Medicaid Other | Admitting: Orthopedic Surgery

## 2019-07-02 ENCOUNTER — Encounter: Payer: Self-pay | Admitting: Orthopedic Surgery

## 2019-07-02 ENCOUNTER — Other Ambulatory Visit: Payer: Self-pay

## 2019-07-02 ENCOUNTER — Ambulatory Visit: Payer: Medicaid Other | Admitting: Orthopedic Surgery

## 2019-07-02 VITALS — BP 144/84 | HR 69 | Ht 71.0 in | Wt 230.0 lb

## 2019-07-02 DIAGNOSIS — G8929 Other chronic pain: Secondary | ICD-10-CM | POA: Diagnosis not present

## 2019-07-02 DIAGNOSIS — Z96652 Presence of left artificial knee joint: Secondary | ICD-10-CM

## 2019-07-02 DIAGNOSIS — M25562 Pain in left knee: Secondary | ICD-10-CM

## 2019-07-02 MED ORDER — METHOCARBAMOL 500 MG PO TABS: 500.0000 mg | ORAL_TABLET | Freq: Four times a day (QID) | ORAL | 1 refills | Status: DC | PRN

## 2019-07-02 NOTE — Progress Notes (Signed)
Chief Complaint  Patient presents with  . Post-op Follow-up    02/18/19 left total knee    Status post left total knee arthroplasty 02/18/2019 last visit had increased swelling due to overactivity  He says he is slowly getting better he would like his Robaxin refilled  He is taking care of his mom who is an invalid  He is walking without support no limp no swelling his range of motion is now 5-115 degrees  Encounter Diagnoses  Name Primary?  . S/P total knee replacement, left 02/18/2019   . Chronic pain of left knee Yes     Meds ordered this encounter  Medications  . methocarbamol (ROBAXIN) 500 MG tablet    Sig: Take 1 tablet (500 mg total) by mouth every 6 (six) hours as needed for muscle spasms.    Dispense:  60 tablet    Refill:  1

## 2019-11-17 ENCOUNTER — Encounter: Payer: Self-pay | Admitting: Orthopedic Surgery

## 2019-11-17 ENCOUNTER — Other Ambulatory Visit: Payer: Self-pay

## 2019-11-17 ENCOUNTER — Ambulatory Visit (INDEPENDENT_AMBULATORY_CARE_PROVIDER_SITE_OTHER): Payer: Medicaid Other | Admitting: Orthopedic Surgery

## 2019-11-17 VITALS — BP 141/90 | HR 59 | Ht 71.0 in | Wt 230.0 lb

## 2019-11-17 DIAGNOSIS — R200 Anesthesia of skin: Secondary | ICD-10-CM

## 2019-11-17 MED ORDER — GABAPENTIN 100 MG PO CAPS: 100.0000 mg | ORAL_CAPSULE | Freq: Three times a day (TID) | ORAL | 2 refills | Status: DC

## 2019-11-17 NOTE — Progress Notes (Signed)
Chief Complaint  Patient presents with  . Knee Problem    numbness medial left thigh and knee has popping left knee s/p TKR 02/18/19   49 year old male status post left total knee approximately a year ago this November comes in after doing some work on a ladder complaining of numbness on the medial aspect of his left thigh from his groin to just below the left knee  No other symptoms  Left knee exam incision has healed no swelling or erythema  His flexion is 120 degrees he has full extension normal strength he has decreased sensation on the medial side of the knee  Questionable etiology  Recommend gabapentin on a as needed basis and follow-up for his annual knee exam . Encounter Diagnosis  Name Primary?  . Leg numbness Yes   Meds ordered this encounter  Medications  . gabapentin (NEURONTIN) 100 MG capsule    Sig: Take 1 capsule (100 mg total) by mouth 3 (three) times daily.    Dispense:  90 capsule    Refill:  2

## 2020-01-20 ENCOUNTER — Other Ambulatory Visit: Payer: Self-pay | Admitting: Orthopedic Surgery

## 2020-01-20 DIAGNOSIS — R200 Anesthesia of skin: Secondary | ICD-10-CM

## 2020-02-18 ENCOUNTER — Ambulatory Visit (INDEPENDENT_AMBULATORY_CARE_PROVIDER_SITE_OTHER): Payer: Medicaid Other | Admitting: Orthopedic Surgery

## 2020-02-18 ENCOUNTER — Encounter: Payer: Self-pay | Admitting: Orthopedic Surgery

## 2020-02-18 ENCOUNTER — Other Ambulatory Visit: Payer: Self-pay

## 2020-02-18 ENCOUNTER — Ambulatory Visit: Payer: Medicaid Other

## 2020-02-18 VITALS — BP 127/81 | HR 61 | Ht 71.0 in | Wt 230.0 lb

## 2020-02-18 DIAGNOSIS — M1712 Unilateral primary osteoarthritis, left knee: Secondary | ICD-10-CM

## 2020-02-18 DIAGNOSIS — Z96652 Presence of left artificial knee joint: Secondary | ICD-10-CM | POA: Diagnosis not present

## 2020-02-18 DIAGNOSIS — M25462 Effusion, left knee: Secondary | ICD-10-CM | POA: Diagnosis not present

## 2020-02-18 NOTE — Progress Notes (Signed)
Chief Complaint  Patient presents with  . Routine Post Op    has had swelling left knee states has "fever" in it PCP evaluated last week told him to ER if gets worse TKR 02/18/19   49 year old male had a left total knee in November 2020 comes in for his 1 year follow-up complaining of a history of 2 months intermittent warmness redness and swelling went to his PCP last week was advised to go to the emergency room but he had an appointment here so he kept that 1.  He also has had several falls which she did not report here  No fever Or malaise  Past Medical History:  Diagnosis Date  . Anxiety   . Arthritis   . Chronic back pain   . Chronic knee pain   . GERD (gastroesophageal reflux disease)    occ  . HCV antibody positive   . Hypertension    "Dr Karie Kirks took me off meds"  diet control  . Lumbar radiculopathy   . Pre-diabetes     BP 127/81   Pulse 61   Ht 5\' 11"  (1.803 m)   Wt 230 lb (104.3 kg)   BMI 32.08 kg/m   Exam shows full extension 120 degrees of flexion no collateral ligament instability  Left knee is warm compared to right  There is no effusion  No erythema  No pain over the patella  Mild tenderness around the joint however.  X-ray shows a nondisplaced transverse fracture of the patella not seen on prior x-ray  No loosening  No effusion  Recommend CBC sed rate C-reactive protein  We will see him next Thursday for follow-up  Encounter Diagnoses  Name Primary?  . S/P total knee replacement, left 02/18/2019 Yes  . Primary osteoarthritis of left knee   . Swelling of joint of left knee

## 2020-02-19 LAB — CBC WITH DIFFERENTIAL/PLATELET
Absolute Monocytes: 475 cells/uL (ref 200–950)
Basophils Absolute: 20 cells/uL (ref 0–200)
Basophils Relative: 0.3 %
Eosinophils Absolute: 111 cells/uL (ref 15–500)
Eosinophils Relative: 1.7 %
HCT: 42.7 % (ref 38.5–50.0)
Hemoglobin: 14.8 g/dL (ref 13.2–17.1)
Lymphs Abs: 1788 cells/uL (ref 850–3900)
MCH: 33 pg (ref 27.0–33.0)
MCHC: 34.7 g/dL (ref 32.0–36.0)
MCV: 95.3 fL (ref 80.0–100.0)
MPV: 10.1 fL (ref 7.5–12.5)
Monocytes Relative: 7.3 %
Neutro Abs: 4108 cells/uL (ref 1500–7800)
Neutrophils Relative %: 63.2 %
Platelets: 249 10*3/uL (ref 140–400)
RBC: 4.48 10*6/uL (ref 4.20–5.80)
RDW: 12.6 % (ref 11.0–15.0)
Total Lymphocyte: 27.5 %
WBC: 6.5 10*3/uL (ref 3.8–10.8)

## 2020-02-19 LAB — SEDIMENTATION RATE: Sed Rate: 22 mm/h — ABNORMAL HIGH (ref 0–20)

## 2020-02-19 LAB — C-REACTIVE PROTEIN: CRP: 11.3 mg/L — ABNORMAL HIGH (ref <8.0)

## 2020-02-23 ENCOUNTER — Telehealth: Payer: Self-pay | Admitting: Orthopedic Surgery

## 2020-02-23 NOTE — Telephone Encounter (Signed)
Labs are elevated to be discussed on appointment November 18

## 2020-02-23 NOTE — Telephone Encounter (Signed)
I called him we discussed

## 2020-02-23 NOTE — Telephone Encounter (Signed)
Jamie [name] called and wanted to know if we got the results of his lab work yet?  He said his knee is  a little more swollen than it was on Friday and that it is still warm.  Could someone get back to him on the lab results?  Thanks

## 2020-02-26 ENCOUNTER — Other Ambulatory Visit: Payer: Self-pay

## 2020-02-26 ENCOUNTER — Encounter: Payer: Self-pay | Admitting: Orthopedic Surgery

## 2020-02-26 ENCOUNTER — Ambulatory Visit (INDEPENDENT_AMBULATORY_CARE_PROVIDER_SITE_OTHER): Payer: Medicaid Other | Admitting: Orthopedic Surgery

## 2020-02-26 VITALS — BP 129/69 | HR 59 | Ht 71.0 in | Wt 230.0 lb

## 2020-02-26 DIAGNOSIS — M25462 Effusion, left knee: Secondary | ICD-10-CM | POA: Diagnosis not present

## 2020-02-26 DIAGNOSIS — Z96652 Presence of left artificial knee joint: Secondary | ICD-10-CM

## 2020-02-26 NOTE — Progress Notes (Signed)
No chief complaint on file.   Encounter Diagnoses  Name Primary?  . S/P total knee replacement, left 02/18/2019 Yes  . Effusion of left knee     49 year old male had laboratory studies done for work-up of possible infection left knee  He tells me today that he has been having hyperextension of the knee for 3 months , he also reports several falls   (xrays last visit show a fractured patella, despite intact extensor mechanism)    Labs 11/10 CBC 11.2  Sed rate 22  C-reactive protein 11.3  Left knee effusion   C/O knee hyperextending x 3 months   Procedure note injection and aspiration left knee joint  Verbal consent was obtained to aspirate and inject the left knee joint   Timeout was completed to confirm the site of aspiration and injection  An 18-gauge needle was used to aspirate the left knee joint from a suprapatellar lateral approach.  The medications used were 40 mg of Depo-Medrol and 1% lidocaine 3 cc  Anesthesia was provided by ethyl chloride and the skin was prepped with alcohol.  After cleaning the skin with alcohol an 18-gauge needle was used to aspirate the right knee joint.  We obtained 60 cc of fluid clear amber   There were no complications. A sterile bandage was applied.  Fluid sent for culture cell count and crystal analysis   Encounter Diagnoses  Name Primary?  . S/P total knee replacement, left 02/18/2019 Yes  . Effusion of left knee    Discussion  49 year old male successful total knee had good range of motion then about 3 months ago after several falls his knee started hyperextended and swelling  The knee became warm we did his lab work it came back elevated  Aspiration was done today awaiting that fluid  Potential causes  Fractured patella with loose patellar button, infection, hyperextension tension due to excessive polyethylene wear after the patella fracture caused a hyperextension for example excessive posterior wear  Patient  aware of all possibilities patient agrees to brace economy hinge until we get his labs back and delineate the cause of his effusions

## 2020-02-29 LAB — SYNOVIAL FLUID ANALYSIS, COMPLETE
Basophils, %: 0 %
Eosinophils-Synovial: 0 % (ref 0–2)
Lymphocytes-Synovial Fld: 0 % (ref 0–74)
Monocyte/Macrophage: 4 % (ref 0–69)
Neutrophil, Synovial: 96 % — ABNORMAL HIGH (ref 0–24)
Synoviocytes, %: 0 % (ref 0–15)
WBC, Synovial: 3678 cells/uL — ABNORMAL HIGH (ref <150)

## 2020-02-29 LAB — WOUND CULTURE
GRAM STAIN:: NONE SEEN
MICRO NUMBER:: 11221046
RESULT:: NO GROWTH
SPECIMEN QUALITY:: ADEQUATE

## 2020-03-08 ENCOUNTER — Telehealth: Payer: Self-pay | Admitting: Orthopedic Surgery

## 2020-03-08 NOTE — Telephone Encounter (Signed)
Patient called regarding lab results - please advise - last note indicates Dr will call patient. Also, no follow up appointment at this time, pending results. Ph# [phone].

## 2020-03-11 ENCOUNTER — Ambulatory Visit: Payer: Medicaid Other | Admitting: Orthopedic Surgery

## 2020-03-11 DIAGNOSIS — M25462 Effusion, left knee: Secondary | ICD-10-CM

## 2020-03-11 DIAGNOSIS — Z96652 Presence of left artificial knee joint: Secondary | ICD-10-CM

## 2020-03-15 ENCOUNTER — Encounter: Payer: Self-pay | Admitting: Orthopedic Surgery

## 2020-03-15 ENCOUNTER — Ambulatory Visit (INDEPENDENT_AMBULATORY_CARE_PROVIDER_SITE_OTHER): Payer: Medicaid Other | Admitting: Orthopedic Surgery

## 2020-03-15 ENCOUNTER — Other Ambulatory Visit: Payer: Self-pay

## 2020-03-15 VITALS — BP 121/68 | HR 59 | Ht 71.0 in | Wt 231.0 lb

## 2020-03-15 DIAGNOSIS — M1712 Unilateral primary osteoarthritis, left knee: Secondary | ICD-10-CM | POA: Diagnosis not present

## 2020-03-15 DIAGNOSIS — Z96652 Presence of left artificial knee joint: Secondary | ICD-10-CM | POA: Diagnosis not present

## 2020-03-15 NOTE — Progress Notes (Signed)
Chief Complaint  Patient presents with  . Knee Pain    L/doing good, some swelling using ice, 4 on pain scale,    Swelling is gone down his motion has improved however his lab work was inconclusive he had elevated sed rate or elevated C-reactive protein.  Negative culture and his cell count was  Site  L KNEE JOINT   Color, Synovial STRAW/YELL DARK YELLOWAbnormal   Appearance-Synovial CLEAR/HAZY CLOUDYAbnormal   WBC, Synovial <150 cells/uL 3,678High   Neutrophil, Synovial 0 - 24 % 96High   Lymphocytes-Synovial Fld 0 - 74 % 0   Monocyte/Macrophage 0 - 69 % 4   Eosinophils-Synovial 0 - 2 % 0   Basophils, % 0 % 0   Synoviocytes, % 0 - 15 % 0   Crystals, Fluid NONE SEEN /HPF    C-reactive protein on November 10 - 11.3  Sed rate on 11/10 was -22  White blood cell count was 11.2  As I told Jamie [name] this is inconclusive with a negative culture I would like him to see Dr. Mayer Camel to get advice  I told him to come and see me if he has any trouble before he gets to see Dr. Mayer Camel as we can repeat the aspiration for another culture

## 2020-03-15 NOTE — Patient Instructions (Signed)
Consult will be scheduled with Dr. Mayer Camel  Call our office for any problems before you get to see him and call us the day you see him for follow-up appointment

## 2020-04-22 ENCOUNTER — Other Ambulatory Visit: Payer: Self-pay

## 2020-04-22 ENCOUNTER — Ambulatory Visit: Payer: Medicaid Other | Admitting: Orthopedic Surgery

## 2020-04-22 ENCOUNTER — Encounter: Payer: Self-pay | Admitting: Orthopedic Surgery

## 2020-04-22 VITALS — BP 154/103 | HR 61 | Ht 71.0 in | Wt 231.0 lb

## 2020-04-22 DIAGNOSIS — M25462 Effusion, left knee: Secondary | ICD-10-CM

## 2020-04-22 DIAGNOSIS — Z96652 Presence of left artificial knee joint: Secondary | ICD-10-CM | POA: Diagnosis not present

## 2020-04-22 NOTE — Progress Notes (Signed)
Chief Complaint  Patient presents with  . Knee Pain    Left/ has had some swelling better today, has appointment with Dr Mayer Camel on Tuesday     50 year old male status post left total knee he has a fracture in his patella which was on recognized by him not sure of the etiology or time frame other than a fall he had  He status post left total knee February 18, 2019 he has been having recurrent effusions he had a joint aspiration on November 10 his C-reactive protein was 11.3 sed rate was 22 his white count was 11 his synovial fluid analysis had 3678 white cells and 96% neutrophils no crystals  He will see Dr. Mayer Camel on Tuesday for evaluation  I aspirated his knee today got back 35 cc of clear yellow fluid sent for repeat analysis  Encounter Diagnoses  Name Primary?  . Effusion of left knee Yes  . S/P total knee replacement, left 02/18/2019

## 2020-04-27 ENCOUNTER — Other Ambulatory Visit: Payer: Self-pay | Admitting: Orthopedic Surgery

## 2020-04-27 LAB — SYNOVIAL FLUID ANALYSIS, COMPLETE
Basophils, %: 0 %
Eosinophils-Synovial: 0 % (ref 0–2)
Lymphocytes-Synovial Fld: 14 % (ref 0–74)
Monocyte/Macrophage: 24 % (ref 0–69)
Neutrophil, Synovial: 58 % — ABNORMAL HIGH (ref 0–24)
Synoviocytes, %: 4 % (ref 0–15)
WBC, Synovial: 989 cells/uL — ABNORMAL HIGH (ref <150)

## 2020-04-27 LAB — WOUND CULTURE
GRAM STAIN:: NONE SEEN
MICRO NUMBER:: 11416063
RESULT:: NO GROWTH
SPECIMEN QUALITY:: ADEQUATE

## 2020-04-28 DIAGNOSIS — M978XXA Periprosthetic fracture around other internal prosthetic joint, initial encounter: Secondary | ICD-10-CM

## 2020-04-28 NOTE — Patient Instructions (Addendum)
DUE TO COVID-19 ONLY ONE VISITOR IS ALLOWED TO COME WITH YOU AND STAY IN THE WAITING ROOM ONLY DURING PRE OP AND PROCEDURE DAY OF SURGERY. THE 1 VISITOR  MAY VISIT WITH YOU AFTER SURGERY IN YOUR PRIVATE ROOM DURING VISITING HOURS ONLY!  YOU NEED TO HAVE A COVID 19 TEST ON_1-20-22______ @_______ , THIS TEST MUST BE DONE BEFORE SURGERY,  COVID TESTING SITE [address] [place] [address], IT IS ON THE RIGHT GOING OUT [address] APPROXIMATELY  2 MINUTES PAST [employer] ON THE RIGHT. ONCE YOUR COVID TEST IS COMPLETED,  PLEASE BEGIN THE QUARANTINE INSTRUCTIONS AS OUTLINED IN YOUR HANDOUT.                Jamie [name]  04/28/2020   Your procedure is scheduled on: 05-03-20   Report to [facility] Main  Entrance   Report to admitting at       0740   AM     Call this number if you have problems the morning of surgery [phone]    Remember: NO SOLID FOOD AFTER MIDNIGHT THE NIGHT PRIOR TO SURGERY. NOTHING BY MOUTH EXCEPT CLEAR LIQUIDS UNTIL   0640am. PLEASE FINISH ENSURE DRINK PER SURGEON ORDER  WHICH NEEDS TO BE COMPLETED AT 0640 an then nothing by mouth .     CLEAR LIQUID DIET   Foods Allowed                                                                              Foods Excluded   black Coffee and tea, regular and decaf                                liquids that you cannot  Plain Jell-O any favor except red or purple                                           see through such as: Fruit ices (not with fruit pulp)                                                    milk, soups, orange juice  Iced Popsicles                                                       All solid food Carbonated beverages, regular and diet                                    Cranberry, grape and apple juices Sports drinks like Gatorade Lightly seasoned clear broth or consume(fat free) Sugar, honey syrup ________    Jamie [name]  YOUR TEETH MORNING OF SURGERY AND RINSE YOUR MOUTH OUT,  NO CHEWING GUM CANDY OR MINTS.     Take these medicines the morning of surgery with A SIP OF WATER: clonodine,valium,gabapentin,Norco,protonix                                 You may not have any metal on your body including hair pins and              piercings  Do not wear jewelry,  lotions, powders or perfumes, deodorant                     Men may shave face and neck.   Do not bring valuables to the hospital. [facility] IS NOT             RESPONSIBLE   FOR VALUABLES.  Contacts, dentures or bridgework may not be worn into surgery.      Patients discharged the day of surgery will not be allowed to drive home. IF YOU ARE HAVING SURGERY AND GOING HOME THE SAME DAY, YOU MUST HAVE AN ADULT TO DRIVE YOU HOME AND BE WITH YOU FOR 24 HOURS. YOU MAY GO HOME BY TAXI OR UBER OR ORTHERWISE, BUT AN ADULT MUST ACCOMPANY YOU HOME AND STAY WITH YOU FOR 24 HOURS.  Name and phone number of your driver:  Special Instructions: N/A              Please read over the following fact sheets you were given: _____________________________________________________________________             [facility] - Preparing for Surgery Before surgery, you can play an important role.  Because skin is not sterile, your skin needs to be as free of germs as possible.  You can reduce the number of germs on your skin by washing with CHG (chlorahexidine gluconate) soap before surgery.  CHG is an antiseptic cleaner which kills germs and bonds with the skin to continue killing germs even after washing. Please DO NOT use if you have an allergy to CHG or antibacterial soaps.  If your skin becomes reddened/irritated stop using the CHG and inform your nurse when you arrive at Short Stay. Do not shave (including legs and underarms) for at least 48 hours prior to the first CHG shower.  You may shave your face/neck. Please follow these instructions carefully:  1.  Shower with CHG Soap the night before surgery and the  morning of  Surgery.  2.  If you choose to wash your hair, wash your hair first as usual with your  normal  shampoo.  3.  After you shampoo, rinse your hair and body thoroughly to remove the  shampoo.                           4.  Use CHG as you would any other liquid soap.  You can apply chg directly  to the skin and wash                       Gently with a scrungie or clean washcloth.  5.  Apply the CHG Soap to your body ONLY FROM THE NECK DOWN.   Do not use on face/ open  Wound or open sores. Avoid contact with eyes, ears mouth and genitals (private parts).                       Wash face,  Genitals (private parts) with your normal soap.             6.  Wash thoroughly, paying special attention to the area where your surgery  will be performed.  7.  Thoroughly rinse your body with warm water from the neck down.  8.  DO NOT shower/wash with your normal soap after using and rinsing off  the CHG Soap.                9.  Pat yourself dry with a clean towel.            10.  Wear clean pajamas.            11.  Place clean sheets on your bed the night of your first shower and do not  sleep with pets. Day of Surgery : Do not apply any lotions/deodorants the morning of surgery.  Please wear clean clothes to the hospital/surgery center.  FAILURE TO FOLLOW THESE INSTRUCTIONS MAY RESULT IN THE CANCELLATION OF YOUR SURGERY PATIENT SIGNATURE_________________________________  NURSE SIGNATURE__________________________________  ________________________________________________________________________

## 2020-04-28 NOTE — Progress Notes (Signed)
Please place orders in epic pt. Is scheduled for a preop  

## 2020-04-28 NOTE — Progress Notes (Addendum)
PCP -  Cardiologist -   PPM/ICD -  Device Orders -  Rep Notified -   Chest x-ray -  EKG -  Stress Test -  ECHO -  Cardiac Cath -   Sleep Study -  CPAP -   Fasting Blood Sugar -  Checks Blood Sugar _____ times a day  Blood Thinner Instructions: Aspirin Instructions:  ERAS Protcol - PRE-SURGERY Ensure or G2-   COVID TEST- 1-20   Anesthesia review: ekg SB same on previous EKG  Patient denies shortness of breath, fever, cough and chest pain at PAT appointment   All instructions explained to the patient, with a verbal understanding of the material. Patient agrees to go over the instructions while at home for a better understanding. Patient also instructed to self quarantine after being tested for COVID-19. The opportunity to ask questions was provided.

## 2020-04-28 NOTE — H&P (Signed)
Jamie [name] is an 50 y.o. male.   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 Aline Brochure up in [place] on 02/18/19.  Patient has had long-term issues with memory and is seen today with his sister.  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  He is presently on Social Security disability.  A few days after the total knee last year the patient reports that he fell directly onto his left patella and he states that he has had some pain anteriorly in the knee ever since.  X-rays were done in November 2021 showed a transverse patella fracture the middle of the patella, the sunrise x-rays did not show any overt loosening of the patellar implant.  The patient has had a couple of knee aspiration showing no evidence of infection but he does report that the pain in his left knee with swelling is getting worse and he is also developing an extension lag.  Prior to seeing the patient was able to review his preop immediate postop and most recent x-rays.  Patient smokes a few cigarettes a day, takes 4 Vicodin a day long-term as well as gabapentin 100 mg by mouth 3 times a day.  He also takes clonidine.  He denies any history of seizures.  Past Medical History:  Diagnosis Date  . Anxiety   . Arthritis   . Chronic back pain   . Chronic knee pain   . GERD (gastroesophageal reflux disease)    occ  . HCV antibody positive   . Hypertension    "Dr Karie Kirks took me off meds"  diet control  . Lumbar radiculopathy   . Pre-diabetes     Past Surgical History:  Procedure Laterality Date  . BACK SURGERY    . CARPAL TUNNEL RELEASE Right 06/25/2013   Procedure: CARPAL TUNNEL RELEASE;  Surgeon: Carole Civil, MD;  Location: [facility];  Service: Orthopedics;  Laterality: Right;  . CARPAL TUNNEL RELEASE Left 07/25/2013    Procedure: LEFT CARPAL TUNNEL RELEASE;  Surgeon: Carole Civil, MD;  Location: [facility];  Service: Orthopedics;  Laterality: Left;  . COLONOSCOPY WITH PROPOFOL N/A 04/05/2017   Procedure: COLONOSCOPY WITH PROPOFOL;  Surgeon: Daneil Dolin, MD;  Location: [facility] SU[facility];  Service: Endoscopy;  Laterality: N/A;  12:30pm-pt notified to arrive at 9:15am for 10:45am procedure per KF  . ELBOW SURGERY Left   . FOOT SURGERY Right   . HERNIA REPAIR Right    inguinal- age 66  . KNEE ARTHROSCOPY WITH MEDIAL MENISECTOMY Left 12/07/2016   Procedure: KNEE ARTHROSCOPY WITH MEDIAL MENISECTOMY;  Surgeon: Carole Civil, MD;  Location: [facility];  Service: Orthopedics;  Laterality: Left;  . KNEE SURGERY    . left elbow    . LUMBAR LAMINECTOMY/DECOMPRESSION MICRODISCECTOMY Left 10/06/2013   Procedure: Left Lumbar Three-four microdiskectomy;  Surgeon: Ophelia Charter, MD;  Location: [facility];  Service: Neurosurgery;  Laterality: Left;  Left Lumbar Three-four microdiskectomy  . right foot     forgein body removal  . right knee  orif right patella [place] 1993  . SEPTOPLASTY    . TOTAL KNEE ARTHROPLASTY Left 02/18/2019   Procedure: TOTAL KNEE ARTHROPLASTY;  Surgeon: Carole Civil, MD;  Location: [facility];  Service: Orthopedics;  Laterality: Left;  Family History  Problem Relation Age of Onset  . Heart disease Other   . Arthritis Other   . Cancer Other   . Asthma Other   . Diabetes Other   . Kidney disease Other   . Colon cancer Father 73   Social History:  reports that he quit smoking about 7 years ago. His smoking use included cigarettes. He has a 15.00 pack-year smoking history. He quit smokeless tobacco use about 29 years ago.  His smokeless tobacco use included chew. He reports current drug use. Frequency: 2.00 times per week. Drug: Marijuana. He reports that he does not drink alcohol.  Allergies: No Known Allergies  No medications prior to admission.    No results found for this or  any previous visit (from the past 48 hour(s)). No results found.  Review of Systems  Constitutional: Negative.   HENT: Negative.   Eyes: Negative.   Respiratory: Negative.   Cardiovascular: Negative.   Gastrointestinal: Negative.   Endocrine: Negative.   Genitourinary: Negative.   Musculoskeletal: Positive for arthralgias.  Skin: Negative.   Allergic/Immunologic: Negative.   Neurological: Negative.   Hematological: Negative.   Psychiatric/Behavioral: Negative.     There were no vitals taken for this visit. Physical Exam Constitutional:      Appearance: Normal appearance. He is normal weight.  HENT:     Head: Normocephalic and atraumatic.     Nose: Nose normal.  Eyes:     Pupils: Pupils are equal, round, and reactive to light.  Cardiovascular:     Pulses: Normal pulses.  Pulmonary:     Effort: Pulmonary effort is normal.  Musculoskeletal:        General: Tenderness and deformity present.     Cervical back: Normal range of motion and neck supple.     Comments: Surgical scars to both knees are well-healed left total knee has a 1+ effusion in about a 15-20 extension lag he get him to full extension with pressure but it does cause discomfort.  Collateral ligaments are stable neurovascular intact distally.  Mild to moderate quadriceps atrophy left versus right.  Skin:    General: Skin is warm and dry.  Neurological:     Mental Status: He is alert.  Psychiatric:        Mood and Affect: Mood normal.        Behavior: Behavior normal.      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  The fracture has increased diastases by 5 or 6 mm over the last couple of months.  Plan: With his sister in the room we discussed options I believe the best choice would be a patellectomy me and then  placing him in a knee immobilizer for 6-8 weeks postoperatively and beginning gentle range of motion.  Attempting ORIF and placement of a new patellar implant has a very high failure rate in the best of circumstances, which this is not.  We are presently in the middle of a Covid lockdown.  I will try to get this scheduled this coming Monday as an add-on case, urgent P1, which meets the present criteria for elective surgery.  I am placing him in a knee immobilizer effective today which he will have to wear 24 7 until surgery.  I have also recommended very limited ambulation.  Patient is agreed to discontinue all tobacco  products starting now and for 6-8 weeks after surgery.  Joanell Rising, PA-C 04/28/2020, 10:52 AM

## 2020-04-29 ENCOUNTER — Other Ambulatory Visit: Payer: Self-pay

## 2020-04-29 ENCOUNTER — Encounter (HOSPITAL_COMMUNITY): Payer: Self-pay

## 2020-04-29 ENCOUNTER — Encounter (HOSPITAL_COMMUNITY)
Admission: RE | Admit: 2020-04-29 | Discharge: 2020-04-29 | Disposition: A | Discharge status: Home / Self Care | Payer: Medicaid Other | Source: Ambulatory Visit | Attending: Orthopedic Surgery | Admitting: Orthopedic Surgery

## 2020-04-29 ENCOUNTER — Other Ambulatory Visit (HOSPITAL_COMMUNITY)
Admission: RE | Admit: 2020-04-29 | Discharge: 2020-04-29 | Disposition: A | Discharge status: Home / Self Care | Payer: Medicaid Other | Source: Ambulatory Visit | Attending: Orthopedic Surgery | Admitting: Orthopedic Surgery

## 2020-04-29 DIAGNOSIS — I10 Essential (primary) hypertension: Secondary | ICD-10-CM | POA: Diagnosis not present

## 2020-04-29 DIAGNOSIS — Z20822 Contact with and (suspected) exposure to covid-19: Secondary | ICD-10-CM | POA: Insufficient documentation

## 2020-04-29 DIAGNOSIS — Z01818 Encounter for other preprocedural examination: Secondary | ICD-10-CM | POA: Insufficient documentation

## 2020-04-29 LAB — CBC WITH DIFFERENTIAL/PLATELET
Abs Immature Granulocytes: 0.03 10*3/uL (ref 0.00–0.07)
Basophils Absolute: 0 10*3/uL (ref 0.0–0.1)
Basophils Relative: 0 %
Eosinophils Absolute: 0.1 10*3/uL (ref 0.0–0.5)
Eosinophils Relative: 2 %
HCT: 43.8 % (ref 39.0–52.0)
Hemoglobin: 15.2 g/dL (ref 13.0–17.0)
Immature Granulocytes: 0 %
Lymphocytes Relative: 32 %
Lymphs Abs: 2.4 10*3/uL (ref 0.7–4.0)
MCH: 32 pg (ref 26.0–34.0)
MCHC: 34.7 g/dL (ref 30.0–36.0)
MCV: 92.2 fL (ref 80.0–100.0)
Monocytes Absolute: 0.5 10*3/uL (ref 0.1–1.0)
Monocytes Relative: 7 %
Neutro Abs: 4.4 10*3/uL (ref 1.7–7.7)
Neutrophils Relative %: 59 %
Platelets: 216 10*3/uL (ref 150–400)
RBC: 4.75 MIL/uL (ref 4.22–5.81)
RDW: 12.8 % (ref 11.5–15.5)
WBC: 7.5 10*3/uL (ref 4.0–10.5)
nRBC: 0 % (ref 0.0–0.2)

## 2020-04-29 LAB — COMPREHENSIVE METABOLIC PANEL
ALT: 15 U/L (ref 0–44)
AST: 18 U/L (ref 15–41)
Albumin: 4.8 g/dL (ref 3.5–5.0)
Alkaline Phosphatase: 35 U/L — ABNORMAL LOW (ref 38–126)
Anion gap: 7 (ref 5–15)
BUN: 23 mg/dL — ABNORMAL HIGH (ref 6–20)
CO2: 32 mmol/L (ref 22–32)
Calcium: 9.7 mg/dL (ref 8.9–10.3)
Chloride: 102 mmol/L (ref 98–111)
Creatinine, Ser: 1.04 mg/dL (ref 0.61–1.24)
GFR, Estimated: >60 mL/min (ref >60)
Glucose, Bld: 86 mg/dL (ref 70–99)
Potassium: 4.4 mmol/L (ref 3.5–5.1)
Sodium: 141 mmol/L (ref 135–145)
Total Bilirubin: 0.8 mg/dL (ref 0.3–1.2)
Total Protein: 7.8 g/dL (ref 6.5–8.1)

## 2020-04-29 LAB — HEMOGLOBIN A1C
Hgb A1c MFr Bld: 5 % (ref 4.8–5.6)
Mean Plasma Glucose: 96.8 mg/dL

## 2020-04-29 LAB — PROTIME-INR
INR: 1 (ref 0.8–1.2)
Prothrombin Time: 13 seconds (ref 11.4–15.2)

## 2020-04-29 LAB — SARS CORONAVIRUS 2 (TAT 6-24 HRS): SARS Coronavirus 2: NEGATIVE

## 2020-04-29 LAB — APTT: aPTT: 36 seconds (ref 24–36)

## 2020-04-29 NOTE — Progress Notes (Addendum)
COVID Vaccine Completed:  x3 Date COVID Vaccine completed:  04/2020 Booster COVID vaccine manufacturer: Pfizer    Moderna   [employer]   PCP - Lemmie Evens, MD Cardiologist -   Chest x-ray - N/A EKG - 04-29-20 in Epic Stress Test -  ECHO -  Cardiac Cath -  Pacemaker/ICD device last checked:  Sleep Study - N/A CPAP -   Fasting Blood Sugar -  N/A Checks Blood Sugar _____ times a day  Blood Thinner Instructions:  N/A Aspirin Instructions: Last Dose:  Anesthesia review:  N/A  Patient denies shortness of breath, fever, cough and chest pain at PAT appointment.  Pt cannot climb a flight of stairs due to knee pain.  Can perform housework and ADLs independently   Patient verbalized understanding of instructions that were given to them at the PAT appointment. Patient was also instructed that they will need to review over the PAT instructions again at home before surgery.

## 2020-04-30 NOTE — Progress Notes (Signed)
Spoke with pts sister Towanda Malkin in regards to pt needing to arrive at 0830 to [facility] admitting on Monday 05/03/2020 for scheduled procedure; Aware of no food after midnight; clear liquids from midnight till 0800 consuming entire presurgery drink by 0800 then nothing by mouth.

## 2020-05-03 ENCOUNTER — Encounter (HOSPITAL_COMMUNITY)
Admission: RE | Disposition: A | Discharge status: Home / Self Care | Payer: Self-pay | Source: Home / Self Care | Attending: Orthopedic Surgery

## 2020-05-03 ENCOUNTER — Ambulatory Visit (HOSPITAL_COMMUNITY)
Admission: RE | Admit: 2020-05-03 | Discharge: 2020-05-03 | Disposition: A | Discharge status: Home / Self Care | Payer: Medicaid Other | Attending: Orthopedic Surgery | Admitting: Orthopedic Surgery

## 2020-05-03 ENCOUNTER — Ambulatory Visit (HOSPITAL_COMMUNITY): Payer: Medicaid Other | Admitting: Anesthesiology

## 2020-05-03 ENCOUNTER — Ambulatory Visit (HOSPITAL_COMMUNITY): Payer: Medicaid Other | Admitting: Physician Assistant

## 2020-05-03 ENCOUNTER — Encounter (HOSPITAL_COMMUNITY): Payer: Self-pay | Admitting: Orthopedic Surgery

## 2020-05-03 DIAGNOSIS — Z8261 Family history of arthritis: Secondary | ICD-10-CM | POA: Insufficient documentation

## 2020-05-03 DIAGNOSIS — Z96652 Presence of left artificial knee joint: Secondary | ICD-10-CM | POA: Insufficient documentation

## 2020-05-03 DIAGNOSIS — S82002A Unspecified fracture of left patella, initial encounter for closed fracture: Secondary | ICD-10-CM | POA: Insufficient documentation

## 2020-05-03 DIAGNOSIS — R7303 Prediabetes: Secondary | ICD-10-CM | POA: Diagnosis not present

## 2020-05-03 DIAGNOSIS — Z809 Family history of malignant neoplasm, unspecified: Secondary | ICD-10-CM | POA: Insufficient documentation

## 2020-05-03 DIAGNOSIS — Z87891 Personal history of nicotine dependence: Secondary | ICD-10-CM | POA: Insufficient documentation

## 2020-05-03 DIAGNOSIS — M978XXA Periprosthetic fracture around other internal prosthetic joint, initial encounter: Secondary | ICD-10-CM

## 2020-05-03 DIAGNOSIS — W19XXXA Unspecified fall, initial encounter: Secondary | ICD-10-CM | POA: Diagnosis not present

## 2020-05-03 DIAGNOSIS — Z841 Family history of disorders of kidney and ureter: Secondary | ICD-10-CM | POA: Insufficient documentation

## 2020-05-03 DIAGNOSIS — Z8249 Family history of ischemic heart disease and other diseases of the circulatory system: Secondary | ICD-10-CM | POA: Insufficient documentation

## 2020-05-03 DIAGNOSIS — Z833 Family history of diabetes mellitus: Secondary | ICD-10-CM | POA: Diagnosis not present

## 2020-05-03 DIAGNOSIS — G8929 Other chronic pain: Secondary | ICD-10-CM | POA: Diagnosis present

## 2020-05-03 DIAGNOSIS — Z79899 Other long term (current) drug therapy: Secondary | ICD-10-CM | POA: Diagnosis not present

## 2020-05-03 DIAGNOSIS — Z96659 Presence of unspecified artificial knee joint: Secondary | ICD-10-CM

## 2020-05-03 HISTORY — PX: REPAIR OF RUPTURED PATELLA LIGAMENT: SHX6066

## 2020-05-03 SURGERY — REPAIR OF RUPTURED PATELLA LIGAMENT
Anesthesia: General | Site: Knee | Laterality: Left

## 2020-05-03 MED ORDER — POVIDONE-IODINE 10 % EX SWAB
2.0000 "application " | Freq: Once | CUTANEOUS | Status: AC
  Administered 2020-05-03: 2 via TOPICAL

## 2020-05-03 MED ORDER — ACETAMINOPHEN 160 MG/5ML PO SOLN: 325.0000 mg | ORAL | Status: DC | PRN

## 2020-05-03 MED ORDER — BUPIVACAINE HCL 0.25 % IJ SOLN
INTRAMUSCULAR | Status: AC
  Filled 2020-05-03: qty 1

## 2020-05-03 MED ORDER — MIDAZOLAM HCL 2 MG/2ML IJ SOLN
1.0000 mg | INTRAMUSCULAR | Status: DC
  Administered 2020-05-03 (×2): 2 mg via INTRAVENOUS
  Filled 2020-05-03 (×2): qty 2

## 2020-05-03 MED ORDER — HYDROMORPHONE HCL 2 MG/ML IJ SOLN
INTRAMUSCULAR | Status: AC
  Filled 2020-05-03: qty 1

## 2020-05-03 MED ORDER — OXYCODONE HCL 5 MG PO TABS: 5.0000 mg | ORAL_TABLET | Freq: Once | ORAL | Status: AC | PRN

## 2020-05-03 MED ORDER — CEFAZOLIN SODIUM-DEXTROSE 2-4 GM/100ML-% IV SOLN
2.0000 g | INTRAVENOUS | Status: AC
  Administered 2020-05-03: 2 g via INTRAVENOUS
  Filled 2020-05-03: qty 100

## 2020-05-03 MED ORDER — ORAL CARE MOUTH RINSE: 15.0000 mL | Freq: Once | OROMUCOSAL | Status: AC

## 2020-05-03 MED ORDER — OXYCODONE HCL 5 MG/5ML PO SOLN: 5.0000 mg | Freq: Once | ORAL | Status: AC | PRN

## 2020-05-03 MED ORDER — CLONIDINE HCL (ANALGESIA) 100 MCG/ML EP SOLN
EPIDURAL | Status: DC | PRN
  Administered 2020-05-03: 100 ug

## 2020-05-03 MED ORDER — CHLORHEXIDINE GLUCONATE 0.12 % MT SOLN
15.0000 mL | Freq: Once | OROMUCOSAL | Status: AC
  Administered 2020-05-03: 15 mL via OROMUCOSAL

## 2020-05-03 MED ORDER — DEXAMETHASONE SODIUM PHOSPHATE 10 MG/ML IJ SOLN
INTRAMUSCULAR | Status: AC
  Filled 2020-05-03: qty 1

## 2020-05-03 MED ORDER — LIDOCAINE HCL (PF) 2 % IJ SOLN
INTRAMUSCULAR | Status: AC
  Filled 2020-05-03: qty 5

## 2020-05-03 MED ORDER — LACTATED RINGERS IV SOLN: INTRAVENOUS | Status: DC

## 2020-05-03 MED ORDER — LIDOCAINE 2% (20 MG/ML) 5 ML SYRINGE
INTRAMUSCULAR | Status: DC | PRN
  Administered 2020-05-03: 100 mg via INTRAVENOUS

## 2020-05-03 MED ORDER — HYDROMORPHONE HCL 1 MG/ML IJ SOLN
INTRAMUSCULAR | Status: DC | PRN
  Administered 2020-05-03 (×4): .5 mg via INTRAVENOUS

## 2020-05-03 MED ORDER — PROPOFOL 10 MG/ML IV BOLUS
INTRAVENOUS | Status: DC | PRN
  Administered 2020-05-03: 150 mg via INTRAVENOUS

## 2020-05-03 MED ORDER — ROPIVACAINE HCL 5 MG/ML IJ SOLN
INTRAMUSCULAR | Status: DC | PRN
  Administered 2020-05-03 (×2): 5 mL via PERINEURAL

## 2020-05-03 MED ORDER — MEPERIDINE HCL 50 MG/ML IJ SOLN: 6.2500 mg | INTRAMUSCULAR | Status: DC | PRN

## 2020-05-03 MED ORDER — FENTANYL CITRATE (PF) 100 MCG/2ML IJ SOLN
INTRAMUSCULAR | Status: DC | PRN
  Administered 2020-05-03 (×2): 50 ug via INTRAVENOUS

## 2020-05-03 MED ORDER — PROPOFOL 10 MG/ML IV BOLUS
INTRAVENOUS | Status: AC
  Filled 2020-05-03: qty 20

## 2020-05-03 MED ORDER — DEXAMETHASONE SODIUM PHOSPHATE 10 MG/ML IJ SOLN
INTRAMUSCULAR | Status: DC | PRN
  Administered 2020-05-03: 10 mg via INTRAVENOUS

## 2020-05-03 MED ORDER — FENTANYL CITRATE (PF) 100 MCG/2ML IJ SOLN: 25.0000 ug | INTRAMUSCULAR | Status: DC | PRN

## 2020-05-03 MED ORDER — ACETAMINOPHEN 325 MG PO TABS: 325.0000 mg | ORAL_TABLET | ORAL | Status: DC | PRN

## 2020-05-03 MED ORDER — OXYCODONE HCL 5 MG PO TABS
ORAL_TABLET | ORAL | Status: AC
  Administered 2020-05-03: 5 mg via ORAL
  Filled 2020-05-03: qty 1

## 2020-05-03 MED ORDER — TIZANIDINE HCL 2 MG PO TABS: 2.0000 mg | ORAL_TABLET | Freq: Four times a day (QID) | ORAL | 0 refills | Status: DC | PRN

## 2020-05-03 MED ORDER — ASPIRIN EC 81 MG PO TBEC: 81.0000 mg | DELAYED_RELEASE_TABLET | Freq: Two times a day (BID) | ORAL | 0 refills | Status: DC

## 2020-05-03 MED ORDER — ONDANSETRON HCL 4 MG/2ML IJ SOLN
INTRAMUSCULAR | Status: DC | PRN
  Administered 2020-05-03: 4 mg via INTRAVENOUS

## 2020-05-03 MED ORDER — FENTANYL CITRATE (PF) 100 MCG/2ML IJ SOLN
INTRAMUSCULAR | Status: AC
  Filled 2020-05-03: qty 2

## 2020-05-03 MED ORDER — ROPIVACAINE HCL 7.5 MG/ML IJ SOLN
INTRAMUSCULAR | Status: DC | PRN
  Administered 2020-05-03 (×4): 5 mL via PERINEURAL

## 2020-05-03 MED ORDER — BUPIVACAINE HCL (PF) 0.25 % IJ SOLN
INTRAMUSCULAR | Status: DC | PRN
  Administered 2020-05-03: 30 mL

## 2020-05-03 MED ORDER — KETOROLAC TROMETHAMINE 30 MG/ML IJ SOLN
INTRAMUSCULAR | Status: AC
  Administered 2020-05-03: 30 mg via INTRAVENOUS
  Filled 2020-05-03: qty 1

## 2020-05-03 MED ORDER — 0.9 % SODIUM CHLORIDE (POUR BTL) OPTIME
TOPICAL | Status: DC | PRN
  Administered 2020-05-03: 1000 mL

## 2020-05-03 MED ORDER — OXYCODONE-ACETAMINOPHEN 5-325 MG PO TABS: 1.0000 | ORAL_TABLET | ORAL | 0 refills | Status: DC | PRN

## 2020-05-03 MED ORDER — KETOROLAC TROMETHAMINE 30 MG/ML IJ SOLN: 30.0000 mg | Freq: Once | INTRAMUSCULAR | Status: AC | PRN

## 2020-05-03 MED ORDER — ONDANSETRON HCL 4 MG/2ML IJ SOLN: 4.0000 mg | Freq: Once | INTRAMUSCULAR | Status: DC | PRN

## 2020-05-03 MED ORDER — FENTANYL CITRATE (PF) 100 MCG/2ML IJ SOLN
50.0000 ug | INTRAMUSCULAR | Status: DC
  Administered 2020-05-03 (×2): 100 ug via INTRAVENOUS
  Filled 2020-05-03 (×2): qty 2

## 2020-05-03 MED ORDER — ONDANSETRON HCL 4 MG/2ML IJ SOLN
INTRAMUSCULAR | Status: AC
  Filled 2020-05-03: qty 2

## 2020-05-03 SURGICAL SUPPLY — 61 items
BANDAGE ESMARK 6X9 LF (GAUZE/BANDAGES/DRESSINGS) ×1 IMPLANT
BNDG COHESIVE 4X5 TAN STRL (GAUZE/BANDAGES/DRESSINGS) ×2 IMPLANT
BNDG ELASTIC 4X5.8 VLCR STR LF (GAUZE/BANDAGES/DRESSINGS) ×2 IMPLANT
BNDG ELASTIC 6X15 VLCR STRL LF (GAUZE/BANDAGES/DRESSINGS) ×2 IMPLANT
BNDG ELASTIC 6X5.8 VLCR STR LF (GAUZE/BANDAGES/DRESSINGS) ×2 IMPLANT
BNDG ESMARK 6X9 LF (GAUZE/BANDAGES/DRESSINGS) ×2
COVER MAYO STAND STRL (DRAPES) ×2 IMPLANT
COVER WAND RF STERILE (DRAPES) ×2 IMPLANT
CUFF TOURN SGL QUICK 34 (TOURNIQUET CUFF)
CUFF TOURN SGL QUICK 42 (TOURNIQUET CUFF) IMPLANT
CUFF TRNQT CYL 34X4.125X (TOURNIQUET CUFF) IMPLANT
DRAPE IMP U-DRAPE 54X76 (DRAPES) ×2 IMPLANT
DRAPE INCISE IOBAN 66X45 STRL (DRAPES) ×2 IMPLANT
DRAPE U-SHAPE 47X51 STRL (DRAPES) ×2 IMPLANT
DRSG AQUACEL AG ADV 3.5X14 (GAUZE/BANDAGES/DRESSINGS) ×2 IMPLANT
DRSG PAD ABDOMINAL 8X10 ST (GAUZE/BANDAGES/DRESSINGS) ×2 IMPLANT
DURAPREP 26ML APPLICATOR (WOUND CARE) ×2 IMPLANT
ELECT REM PT RETURN 15FT ADLT (MISCELLANEOUS) ×2 IMPLANT
GAUZE SPONGE 4X4 12PLY STRL (GAUZE/BANDAGES/DRESSINGS) ×2 IMPLANT
GAUZE XEROFORM 1X8 LF (GAUZE/BANDAGES/DRESSINGS) ×2 IMPLANT
GLOVE BIO SURGEON STRL SZ7.5 (GLOVE) ×2 IMPLANT
GLOVE BIO SURGEON STRL SZ8.5 (GLOVE) ×2 IMPLANT
GLOVE BIOGEL PI IND STRL 9 (GLOVE) ×1 IMPLANT
GLOVE BIOGEL PI INDICATOR 9 (GLOVE) ×1
GLOVE SRG 8 PF TXTR STRL LF DI (GLOVE) ×1 IMPLANT
GLOVE SURG UNDER POLY LF SZ8 (GLOVE) ×2
GOWN STRL REUS W/ TWL LRG LVL3 (GOWN DISPOSABLE) ×2 IMPLANT
GOWN STRL REUS W/ TWL XL LVL3 (GOWN DISPOSABLE) ×1 IMPLANT
GOWN STRL REUS W/TWL 2XL LVL3 (GOWN DISPOSABLE) ×2 IMPLANT
GOWN STRL REUS W/TWL LRG LVL3 (GOWN DISPOSABLE) ×4
GOWN STRL REUS W/TWL XL LVL3 (GOWN DISPOSABLE) ×2
IMMOBILIZER KNEE 22 UNIV (SOFTGOODS) IMPLANT
KIT BASIN OR (CUSTOM PROCEDURE TRAY) ×2 IMPLANT
KIT TURNOVER KIT A (KITS) ×2 IMPLANT
MANIFOLD NEPTUNE II (INSTRUMENTS) ×2 IMPLANT
NEEDLE HYPO 22GX1.5 SAFETY (NEEDLE) IMPLANT
NS IRRIG 1000ML POUR BTL (IV SOLUTION) ×2 IMPLANT
PACK ORTHO EXTREMITY (CUSTOM PROCEDURE TRAY) ×2 IMPLANT
PACK UNIVERSAL I (CUSTOM PROCEDURE TRAY) ×2 IMPLANT
PAD ARMBOARD 7.5X6 YLW CONV (MISCELLANEOUS) ×4 IMPLANT
PAD CAST 4YDX4 CTTN HI CHSV (CAST SUPPLIES) ×2 IMPLANT
PADDING CAST COTTON 4X4 STRL (CAST SUPPLIES) ×4
SPONGE LAP 18X18 RF (DISPOSABLE) ×2 IMPLANT
STAPLER VISISTAT 35W (STAPLE) ×2 IMPLANT
STOCKINETTE 8 INCH (MISCELLANEOUS) ×2 IMPLANT
SUCTION FRAZIER HANDLE 10FR (MISCELLANEOUS)
SUCTION TUBE FRAZIER 10FR DISP (MISCELLANEOUS) IMPLANT
SUT ETHIBOND 2 V 37 (SUTURE) IMPLANT
SUT FIBERWIRE #2 38 REV NDL BL (SUTURE)
SUT PDS AB 4-0 P3 18 (SUTURE) IMPLANT
SUT VIC AB 1 CT1 27 (SUTURE) ×2
SUT VIC AB 1 CT1 27XBRD ANBCTR (SUTURE) ×1 IMPLANT
SUT VIC AB 2-0 CTB1 (SUTURE) ×2 IMPLANT
SUTURE FIBERWR#2 38 REV NDL BL (SUTURE) IMPLANT
SYR CONTROL 10ML LL (SYRINGE) IMPLANT
TOWEL OR 17X26 10 PK STRL BLUE (TOWEL DISPOSABLE) ×2 IMPLANT
TOWEL OR NON WOVEN STRL DISP B (DISPOSABLE) ×2 IMPLANT
TUBING CONNECTING 10 (TUBING) ×2 IMPLANT
UNDERPAD 30X36 HEAVY ABSORB (UNDERPADS AND DIAPERS) ×2 IMPLANT
WATER STERILE IRR 1000ML POUR (IV SOLUTION) ×2 IMPLANT
YANKAUER SUCT BULB TIP NO VENT (SUCTIONS) ×2 IMPLANT

## 2020-05-03 NOTE — Transfer of Care (Signed)
Immediate Anesthesia Transfer of Care Note  Patient: Jamie [name]  Procedure(s) Performed: LEFT KNEE PATELLECTOMY (Left Knee)  Patient Location: PACU  Anesthesia Type:GA combined with regional for post-op pain  Level of Consciousness: drowsy  Airway & Oxygen Therapy: Patient Spontanous Breathing and Patient connected to face mask oxygen  Post-op Assessment: Report given to RN and Post -op Vital signs reviewed and stable  Post vital signs: Reviewed and stable  Last Vitals:  Vitals Value Taken Time  BP    Temp    Pulse 80 05/03/20 1253  Resp 18 05/03/20 1253  SpO2 100 % 05/03/20 1253  Vitals shown include unvalidated device data.  Last Pain:  Vitals:   05/03/20 0844  TempSrc:   PainSc: 0-No pain         Complications: No complications documented.

## 2020-05-03 NOTE — Anesthesia Procedure Notes (Signed)
Anesthesia Regional Block: Adductor canal block   Pre-Anesthetic Checklist: ,, timeout performed, Correct Patient, Correct Site, Correct Laterality, Correct Procedure, Correct Position, site marked, Risks and benefits discussed,  Surgical consent,  Pre-op evaluation,  At surgeon's request and post-op pain management  Laterality: Lower and Left  Prep: chloraprep       Needles:  Injection technique: Single-shot  Needle Type: Echogenic Stimulator Needle     Needle Length: 9cm  Needle Gauge: 20   Needle insertion depth: 2 cm   Additional Needles:   Procedures:,,,, ultrasound used (permanent image in chart),,,,  Narrative:  Start time: 05/03/2020 10:45 AM End time: 05/03/2020 10:55 AM Injection made incrementally with aspirations every 5 mL.  Performed by: Personally  Anesthesiologist: Lyn Hollingshead, MD

## 2020-05-03 NOTE — Discharge Instructions (Addendum)
INSTRUCTIONS AFTER JOINT REPLACEMENT   o Remove items at home which could result in a fall. This includes throw rugs or furniture in walking pathways o ICE to the affected joint every three hours while awake for 30 minutes at a time, for at least the first 3-5 days, and then as needed for pain and swelling.  Continue to use ice for pain and swelling. You may notice swelling that will progress down to the foot and ankle.  This is normal after surgery.  Elevate your leg when you are not up walking on it.   o Continue to use the breathing machine you got in the hospital (incentive spirometer) which will help keep your temperature down.  It is common for your temperature to cycle up and down following surgery, especially at night when you are not up moving around and exerting yourself.  The breathing machine keeps your lungs expanded and your temperature down.   DIET:  As you were doing prior to hospitalization, we recommend a well-balanced diet.  DRESSING / WOUND CARE / SHOWERING  Keep the surgical dressing until follow up.  The dressing is water proof, so you can shower without any extra covering.  IF THE DRESSING FALLS OFF or the wound gets wet inside, change the dressing with sterile gauze.  Please use good hand washing techniques before changing the dressing.  Do not use any lotions or creams on the incision until instructed by your surgeon.    ACTIVITY  o Increase activity slowly as tolerated, but follow the weight bearing instructions below.   o No driving for 6 weeks or until further direction given by your physician.  You cannot drive while taking narcotics.  o No lifting or carrying greater than 10 lbs. until further directed by your surgeon. o Avoid periods of inactivity such as sitting longer than an hour when not asleep. This helps prevent blood clots.  o You may return to work once you are authorized by your doctor.     WEIGHT BEARING   Weight bearing as tolerated with assist  device (walker, cane, etc) as directed, use it as long as suggested by your surgeon or therapist, typically at least 4-6 weeks.   Pt must use knee immobilizer at all times.    CONSTIPATION  Constipation is defined medically as fewer than three stools per week and severe constipation as less than one stool per week.  Even if you have a regular bowel pattern at home, your normal regimen is likely to be disrupted due to multiple reasons following surgery.  Combination of anesthesia, postoperative narcotics, change in appetite and fluid intake all can affect your bowels.   YOU MUST use at least one of the following options; they are listed in order of increasing strength to get the job done.  They are all available over the counter, and you may need to use some, POSSIBLY even all of these options:    Drink plenty of fluids (prune juice may be helpful) and high fiber foods Colace 100 mg by mouth twice a day  Senokot for constipation as directed and as needed Dulcolax (bisacodyl), take with full glass of water  Miralax (polyethylene glycol) once or twice a day as needed.  If you have tried all these things and are unable to have a bowel movement in the first 3-4 days after surgery call either your surgeon or your primary doctor.    If you experience loose stools or diarrhea, hold the medications until you  stool forms back up.  If your symptoms do not get better within 1 week or if they get worse, check with your doctor.  If you experience "the worst abdominal pain ever" or develop nausea or vomiting, please contact the office immediately for further recommendations for treatment.   ITCHING:  If you experience itching with your medications, try taking only a single pain pill, or even half a pain pill at a time.  You can also use Benadryl over the counter for itching or also to help with sleep.   TED HOSE STOCKINGS:  Use stockings on both legs until for at least 2 weeks or as directed by physician  office. They may be removed at night for sleeping.  MEDICATIONS:  See your medication summary on the "After Visit Summary" that nursing will review with you.  You may have some home medications which will be placed on hold until you complete the course of blood thinner medication.  It is important for you to complete the blood thinner medication as prescribed.  PRECAUTIONS:  If you experience chest pain or shortness of breath - call 911 immediately for transfer to the hospital emergency department.   If you develop a fever greater that 101 F, purulent drainage from wound, increased redness or drainage from wound, foul odor from the wound/dressing, or calf pain - CONTACT YOUR SURGEON.                                                   FOLLOW-UP APPOINTMENTS:  If you do not already have a post-op appointment, please call the office for an appointment to be seen by your surgeon.  Guidelines for how soon to be seen are listed in your "After Visit Summary", but are typically between 1-4 weeks after surgery.  OTHER INSTRUCTIONS:   Knee Replacement:  Do not place pillow under knee, focus on keeping the knee straight while resting. CPM instructions: 0-90 degrees, 2 hours in the morning, 2 hours in the afternoon, and 2 hours in the evening. Place foam block, curve side up under heel at all times except when in CPM or when walking.  DO NOT modify, tear, cut, or change the foam block in any way.   DENTAL ANTIBIOTICS:  In most cases prophylactic antibiotics for Dental procdeures after total joint surgery are not necessary.  Exceptions are as follows:  1. History of prior total joint infection  2. Severely immunocompromised (Organ Transplant, cancer chemotherapy, Rheumatoid biologic meds such as [employer])  3. Poorly controlled diabetes (A1C &gt; 8.0, blood glucose over 200)  If you have one of these conditions, contact your surgeon for an antibiotic prescription, prior to your dental  procedure.   MAKE SURE YOU:  . Understand these instructions.  . Get help right away if you are not doing well or get worse.    Thank you for letting us be a part of your medical care team.  It is a privilege we respect greatly.  We hope these instructions will help you stay on track for a fast and full recovery!

## 2020-05-03 NOTE — Progress Notes (Signed)
Assisted Dr. Hatchett with left, ultrasound guided, adductor canal block. Side rails up, monitors on throughout procedure. See vital signs in flow sheet. Tolerated Procedure well. 

## 2020-05-03 NOTE — Anesthesia Procedure Notes (Signed)
Procedure Name: LMA Insertion Date/Time: 05/03/2020 11:18 AM Performed by: Sharlette Dense, CRNA Patient Re-evaluated:Patient Re-evaluated prior to induction Oxygen Delivery Method: Circle system utilized Preoxygenation: Pre-oxygenation with 100% oxygen Induction Type: IV induction Ventilation: Mask ventilation without difficulty LMA: LMA inserted LMA Size: 4.0 Number of attempts: 1 Placement Confirmation: positive ETCO2 and breath sounds checked- equal and bilateral Tube secured with: Tape Dental Injury: Teeth and Oropharynx as per pre-operative assessment

## 2020-05-03 NOTE — Interval H&P Note (Signed)
History and Physical Interval Note:  05/03/2020 9:17 AM  Jamie [name]  has presented today for surgery, with the diagnosis of LEFT TOTAL KNEE REPLACEMENT PERI PROSTHETIC PATELLA FRACTURE.  The various methods of treatment have been discussed with the patient and family. After consideration of risks, benefits and other options for treatment, the patient has consented to  Procedure(s): LEFT KNEE PATELLECTOMY (Left) as a surgical intervention.  The patient's history has been reviewed, patient examined, no change in status, stable for surgery.  I have reviewed the patient's chart and labs.  Questions were answered to the patient's satisfaction.     Kerin Salen

## 2020-05-03 NOTE — Anesthesia Preprocedure Evaluation (Signed)
Anesthesia Evaluation  Patient identified by MRN, date of birth, ID band Patient awake    Reviewed: Allergy & Precautions, NPO status , Patient's Chart, lab work & pertinent test results  Airway Mallampati: I       Dental no notable dental hx.    Pulmonary Current Smoker and Patient abstained from smoking.,    Pulmonary exam normal        Cardiovascular hypertension, Pt. on medications Normal cardiovascular exam     Neuro/Psych Anxiety  Neuromuscular disease    GI/Hepatic GERD  Medicated and Controlled,  Endo/Other  negative endocrine ROS  Renal/GU negative Renal ROS  negative genitourinary   Musculoskeletal   Abdominal (+) + obese,   Peds  Hematology   Anesthesia Other Findings   Reproductive/Obstetrics                             Anesthesia Physical Anesthesia Plan  ASA: II  Anesthesia Plan: General   Post-op Pain Management:  Regional for Post-op pain   Induction: Intravenous  PONV Risk Score and Plan: 2 and Ondansetron, Dexamethasone and Midazolam  Airway Management Planned: LMA  Additional Equipment: None  Intra-op Plan:   Post-operative Plan: Extubation in OR  Informed Consent: I have reviewed the patients History and Physical, chart, labs and discussed the procedure including the risks, benefits and alternatives for the proposed anesthesia with the patient or authorized representative who has indicated his/her understanding and acceptance.     Dental advisory given  Plan Discussed with: CRNA  Anesthesia Plan Comments:         Anesthesia Quick Evaluation

## 2020-05-03 NOTE — Anesthesia Postprocedure Evaluation (Signed)
Anesthesia Post Note  Patient: Jamie [name]  Procedure(s) Performed: LEFT KNEE PATELLECTOMY (Left Knee)     Patient location during evaluation: Phase II Anesthesia Type: General Level of consciousness: awake Pain management: pain level controlled Vital Signs Assessment: post-procedure vital signs reviewed and stable Respiratory status: spontaneous breathing Cardiovascular status: stable Postop Assessment: no apparent nausea or vomiting Anesthetic complications: no   No complications documented.  Last Vitals:  Vitals:   05/03/20 1330 05/03/20 1349  BP: 130/80 135/78  Pulse: 66 62  Resp: 16 12  Temp: (!) 36.4 C   SpO2: 94% 95%    Last Pain:  Vitals:   05/03/20 1330  TempSrc:   PainSc: 3    Pain Goal:                   Huston Foley

## 2020-05-03 NOTE — Op Note (Signed)
Pre Op Dx: Chronic left total knee.  Patella fracture  Post Op Dx: Same  Procedure: Left total knee patellectomy me with repair of quadriceps tendon into patellar tendon  Surgeon: Kerin Salen, MD  Assistant: Kerry Hough. Barton Dubois  (present throughout entire procedure and necessary for timely completion of the procedure)  Anesthesia: General  EBL: 50 cc  Fluids: 800 cc  Indications: Patient had a left total knee arthroplasty done up in [facility] in November 202 millimeters in order to stay over.He has diminished mental capacity with numerous falls.  According to his sister he probably fell and sustained his patella fracture a few months after surgery and it was first noticed on x-ray a few weeks ago.  We saw him in consultation last week and his patella fracture which was displaced 1 or 2 mm had displaced to almost 8 mm from his x-ray that was done in [place] in December.  Further the x-ray showed that the patellar implant did come loose and the fracture was in the middle of the patella and would do poorly with an attempt at ORIF especially with his diminished mental capacity.  Because of this we discussed patellectomy me and use of a knee immobilizer for 6 weeks after surgery to hopefully maintain his extension mechanism without the risk of failure of ORIF.  All questions were encouraged and answered.  Procedure: Patient was identified by armband received preoperative IV antibiotics in the holding area at [facility].  He was then taken to operating room 8 where the appropriate anesthetic monitors were attached and general tracheal anesthesia was induced.  Tourniquet was placed high on the left thigh and a foot positioner was also placed just beneath the left heel.  Left lower extremity was prepped and draped in usual sterile fashion from the ankle to the tourniquet on the thigh.  Timeout procedure was performed.  Limb was wrapped with an Esmarch bandage the knee  flexed to 120 degrees and the tourniquet inflated to 300 mmHg.  Recreating the old anterior incision used for his total knee we cut through the skin and subcutaneous tissue down to the retinaculum over the patella which was reflected medially and laterally.  We were easily able to identify the area of the fracture with diastases and sharply incised making a transverse incision through the tendon and the gap in the patella.  This allowed [place] to shell out the patella and the loose patellar implant proximally and distally.  We then also remove some enlarged synovium from the area.  The knee was placed in full extension with tension you could get the quadriceps and patellar tendons to overlap a good 2 cm.  Using #2 FiberWire with crossed horizontal mattress sutures we then repaired the quadriceps tendon overlapping with the patellar tendon.  This was then oversewn with #1 Vicryl suture the knee was taken through range of motion from 0 to 60 degrees with no diastases or gapping noted.  At this point the tourniquet was let down no significant bleeding was noted the subcutaneous tissue and subcuticular tissues were closed with running 3-0 Vicryl suture after thoroughly irrigating.  A dressing of Aquacil Ace wrap and a knee immobilizer was applied.  The patient was awakened extubated and taken to the recovery room without difficulty.

## 2020-05-04 ENCOUNTER — Encounter (HOSPITAL_COMMUNITY): Payer: Self-pay | Admitting: Orthopedic Surgery

## 2020-05-20 ENCOUNTER — Other Ambulatory Visit: Payer: Self-pay | Admitting: Orthopedic Surgery

## 2020-05-20 DIAGNOSIS — R200 Anesthesia of skin: Secondary | ICD-10-CM

## 2020-06-30 ENCOUNTER — Ambulatory Visit (HOSPITAL_COMMUNITY): Payer: Medicaid Other | Attending: Orthopedic Surgery | Admitting: Physical Therapy

## 2020-06-30 ENCOUNTER — Encounter (HOSPITAL_COMMUNITY): Payer: Self-pay | Admitting: Physical Therapy

## 2020-06-30 ENCOUNTER — Other Ambulatory Visit: Payer: Self-pay

## 2020-06-30 DIAGNOSIS — M25562 Pain in left knee: Secondary | ICD-10-CM

## 2020-06-30 DIAGNOSIS — R2689 Other abnormalities of gait and mobility: Secondary | ICD-10-CM | POA: Diagnosis present

## 2020-06-30 DIAGNOSIS — M25662 Stiffness of left knee, not elsewhere classified: Secondary | ICD-10-CM

## 2020-06-30 DIAGNOSIS — M6281 Muscle weakness (generalized): Secondary | ICD-10-CM

## 2020-06-30 NOTE — Patient Instructions (Signed)
Access Code: [email or web address] URL: [email or web address] Date: 06/30/2020 Prepared by: Mitzi Hansen Adonay Scheier  Exercises ONEOK Set - 3 x daily - 7 x weekly - 10 reps - 10 second hold

## 2020-06-30 NOTE — Therapy (Signed)
[place], [place], [address] Phone: [phone]   Fax:  [phone]  Physical Therapy Evaluation  Patient Details  Name: Jamie [name] MRN: [id number] Date of Birth: 1971-03-24 Referring Provider (PT): Frederik Pear MD   Encounter Date: 06/30/2020   PT End of Session - 06/30/20 0957    Visit Number 1    Number of Visits 12    Date for PT Re-Evaluation 08/11/20    Authorization Type [employer] Medicaid (no auth for patients over 21, 27 visits per year)    Authorization - Visit Number 0    Authorization - Number of Visits 27    PT Start Time 0917    PT Stop Time 0952    PT Time Calculation (min) 35 min    Equipment Utilized During Treatment Other (comment)   hinge brace   Activity Tolerance Patient tolerated treatment well    Behavior During Therapy [facility] for tasks assessed/performed           Past Medical History:  Diagnosis Date  . Anxiety   . Arthritis   . Chronic back pain   . Chronic knee pain   . GERD (gastroesophageal reflux disease)    occ  . HCV antibody positive   . Hypertension    "Dr Karie Kirks took me off meds"  diet control  . Lumbar radiculopathy   . Pre-diabetes     Past Surgical History:  Procedure Laterality Date  . BACK SURGERY    . CARPAL TUNNEL RELEASE Right 06/25/2013   Procedure: CARPAL TUNNEL RELEASE;  Surgeon: Carole Civil, MD;  Location: [facility];  Service: Orthopedics;  Laterality: Right;  . CARPAL TUNNEL RELEASE Left 07/25/2013   Procedure: LEFT CARPAL TUNNEL RELEASE;  Surgeon: Carole Civil, MD;  Location: [facility];  Service: Orthopedics;  Laterality: Left;  . COLONOSCOPY WITH PROPOFOL N/A 04/05/2017   Procedure: COLONOSCOPY WITH PROPOFOL;  Surgeon: Daneil Dolin, MD;  Location: [facility];  Service: Endoscopy;  Laterality: N/A;  12:30pm-pt notified to arrive at 9:15am for 10:45am procedure per KF  . ELBOW SURGERY Left   . FOOT SURGERY Right   . HERNIA REPAIR Right     inguinal- age 58  . KNEE ARTHROSCOPY WITH MEDIAL MENISECTOMY Left 12/07/2016   Procedure: KNEE ARTHROSCOPY WITH MEDIAL MENISECTOMY;  Surgeon: Carole Civil, MD;  Location: [facility];  Service: Orthopedics;  Laterality: Left;  . KNEE SURGERY    . left elbow    . LUMBAR LAMINECTOMY/DECOMPRESSION MICRODISCECTOMY Left 10/06/2013   Procedure: Left Lumbar Three-four microdiskectomy;  Surgeon: Ophelia Charter, MD;  Location: [facility];  Service: Neurosurgery;  Laterality: Left;  Left Lumbar Three-four microdiskectomy  . REPAIR OF RUPTURED PATELLA LIGAMENT Left 05/03/2020   Procedure: LEFT KNEE PATELLECTOMY;  Surgeon: Frederik Pear, MD;  Location: [facility];  Service: Orthopedics;  Laterality: Left;  . right foot     forgein body removal  . right knee  orif right patella [place] 1993  . SEPTOPLASTY    . TOTAL KNEE ARTHROPLASTY Left 02/18/2019   Procedure: TOTAL KNEE ARTHROPLASTY;  Surgeon: Carole Civil, MD;  Location: [facility];  Service: Orthopedics;  Laterality: Left;    There were no vitals filed for this visit.    Subjective Assessment - 06/30/20 0925    Subjective Patient is a 50 y.o. male who presents to physical therapy s/p L patellectomy on 05/03/20. Patient states he had L TKA in November 2020. In  March of 2021, he started having knee pain. Eventually they found out his patella was fractured so they took it out on 05/03/20. They have him on a hinge brace now from 0-30 degrees. He did rehab for his knee and was doing good but then started having knee trouble in march/April 2021. Patient has been walking with the cane. He is not aware of any precautions. Patient states his get rid of brace and begin walking again.    Pertinent History L TKA, patellectomy 05/03/20    Limitations Walking;Standing;House hold activities    Currently in Pain? Yes    Pain Score 4     Pain Location Knee    Pain Orientation Left    Pain Descriptors / Indicators Burning;Sharp    Pain Type Surgical pain    Pain  Onset More than a month ago    Pain Frequency Constant    Aggravating Factors  walking, bending    Pain Relieving Factors ice              [facility] PT Assessment - 06/30/20 0001      Assessment   Medical Diagnosis s/p L patellectomy    Referring Provider (PT) Frederik Pear MD    Onset Date/Surgical Date 05/03/20    Next MD Visit 07/13/20      Precautions   Precautions Knee    Required Braces or Orthoses Other Brace/Splint    Other Brace/Splint hinge brace      Restrictions   Weight Bearing Restrictions No      Balance Screen   Has the patient fallen in the past 6 months No    Has the patient had a decrease in activity level because of a fear of falling?  No    Is the patient reluctant to leave their home because of a fear of falling?  No      Prior Function   Level of Independence Independent    Vocation Unemployed      Cognition   Overall Cognitive Status Within Functional Limits for tasks assessed      Observation/Other Assessments   Observations In hinge brace, ambulates with SPC, unsteady    Focus on Therapeutic Outcomes (FOTO)  n/a      ROM / Strength   AROM / PROM / Strength AROM;Strength      AROM   Overall AROM Comments 0-30 LLE with hinge brace      Strength   Strength Assessment Site Hip;Knee;Ankle    Right/Left Hip Right;Left    Right Hip Flexion 4+/5    Left Hip Flexion 4/5    Right/Left Knee Right;Left    Right Knee Flexion 5/5    Right Knee Extension 5/5    Left Knee Flexion 3+/5   in limited range   Left Knee Extension 3+/5   in limited range   Right/Left Ankle Right;Left    Right Ankle Dorsiflexion 5/5    Left Ankle Dorsiflexion 5/5      Palpation   Palpation comment numb medial knee, no tenderness thoughout L knee      Ambulation/Gait   Ambulation/Gait Yes    Ambulation/Gait Assistance 6: Modified independent (Device/Increase time)    Ambulation Distance (Feet) 350 Feet    Assistive device Straight cane;None    Gait Pattern Left flexed  knee in stance;Antalgic;Decreased hip/knee flexion - left    Ambulation Surface Level;Indoor    Gait Comments 2 MWT, antalgic, uses SPC intially and able to ambulate with minimally unsteady gait without  SPC with continued antaligic gait                      Objective measurements completed on examination: See above findings.       [place] Adult PT Treatment/Exercise - 06/30/20 0001      Transfers   Comments relies on RLE with LLE in extension      Exercises   Exercises Knee/Hip      Knee/Hip Exercises: Seated   Other Seated Knee/Hip Exercises quad set 10x10 second holds                  PT Education - 06/30/20 0917    Education Details Patient educated on exam findings, POC, scope of PT, initial HEP    Person(s) Educated Patient    Methods Explanation;Demonstration;Handout    Comprehension Verbalized understanding;Returned demonstration            PT Short Term Goals - 06/30/20 1016      PT SHORT TERM GOAL #1   Title Patient will be independent with HEP in order to improve functional outcomes.    Time 3    Period Weeks    Status New    Target Date 07/21/20             PT Long Term Goals - 06/30/20 1016      PT LONG TERM GOAL #1   Title Patient will have L knee ROM from 0-60, as protcol states, without symptoms in order to demonstrate improving ROM for transfers.    Time 6    Period Weeks    Status New    Target Date 08/11/20      PT LONG TERM GOAL #2   Title Patient will be able to maintain tandem stance >30 seconds on BLEs to improve stability and reduce risk for falls    Time 6    Period Weeks    Status New    Target Date 08/11/20      PT LONG TERM GOAL #3   Title Patient will have equal to or > 4+/5 MMT throughout LLE to improve ability to perform functional mobility, stair ambulation and ADLs.    Time 6    Period Weeks    Status New    Target Date 08/11/20      PT LONG TERM GOAL #4   Title Patient will be able to ambulate  at least 400 feet during 2MWT with LRAD to demonstrate improved ability to perform functional mobility and associated tasks.    Time 6    Period Weeks    Status New    Target Date 08/11/20                  Plan - 06/30/20 1009    Clinical Impression Statement Patient is a 50 y.o. male who presents to physical therapy s/p L patellectomy on 05/03/20. She presents with pain limited deficits in L knee strength, ROM, gait, balance, transfers, and functional mobility with ADL. She is having to modify and restrict ADL as indicated by subjective information and objective measures which is affecting overall participation. Patient will benefit from skilled physical therapy in order to improve function and reduce impairment.    Personal Factors and Comorbidities Comorbidity 2;Time since onset of injury/illness/exacerbation;Past/Current Experience;Fitness;Education    Comorbidities HTN, L TKA, L patellectomy on 05/03/20    Examination-Activity Limitations Locomotion Level;Transfers;Squat;Stairs;Stand;Lift    Examination-Participation Restrictions Cleaning;Occupation;Community Activity;Shop;Volunteer;Yard Work    [employer]  Stable/Uncomplicated    Clinical Decision Making Low    Rehab Potential Good    PT Frequency 2x / week   1-2x/week   PT Duration 6 weeks    PT Treatment/Interventions ADLs/Self Care Home Management;Aquatic Therapy;Biofeedback;Cryotherapy;Electrical Stimulation;Iontophoresis 4mg /ml Dexamethasone;Moist Heat;Traction;Ultrasound;DME Instruction;Gait training;Stair training;Functional mobility training;Therapeutic activities;Therapeutic exercise;Balance training;Neuromuscular re-education;Patient/family education;Orthotic Fit/Training;Manual techniques;Passive range of motion;Dry needling;Energy conservation;Splinting;Taping;Spinal Manipulations;Joint Manipulations    PT Next Visit Plan possibly switch to 1x/ week as patient limited by Protocol which states 0-30 x  2 weeks, 0-45 x 2 weeks, 0-60 x 2 weeks; begin isometrics at different angles within protocol, balance, hip strengthening    PT Home Exercise Plan 06/30/20 quad set at 0 degrees    Consulted and Agree with Plan of Care Patient           Patient will benefit from skilled therapeutic intervention in order to improve the following deficits and impairments:  Abnormal gait,Decreased range of motion,Difficulty walking,Decreased endurance,Increased muscle spasms,Pain,Decreased activity tolerance,Decreased balance,Impaired flexibility,Improper body mechanics,Decreased mobility,Decreased strength,Impaired sensation  Visit Diagnosis: Left knee pain, unspecified chronicity  Stiffness of left knee, not elsewhere classified  Muscle weakness (generalized)  Other abnormalities of gait and mobility     Problem List Patient Active Problem List   Diagnosis Date Noted  . Peri-prosthetic patellar fracture 04/28/2020  . S/P total knee replacement, left 02/18/2019 03/04/2019  . Osteoarthritis of left knee 02/18/2019  . Effusion of knee joint, left 06/03/2018  . S/P left knee arthroscopy 12/07/16 04/13/2017  . Family history of colon cancer 02/28/2017  . Hepatic cirrhosis ([place]) 02/28/2017  . Derangement of posterior horn of medial meniscus of left knee   . Primary osteoarthritis of left knee   . Chondromalacia of medial femoral condyle, left   . Hepatitis C 06/15/2016  . Lumbar herniated disc 10/06/2013  . Carpal tunnel syndrome 07/28/2013    10:21 AM, 06/30/20 Mearl Latin PT, DPT Physical Therapist at [place], [place], [address] Phone: [phone]   Fax:  [phone]  Name: Jamie [name] MRN: [id number] Date of Birth: Jul 03, 1970

## 2020-07-02 ENCOUNTER — Other Ambulatory Visit: Payer: Self-pay

## 2020-07-02 ENCOUNTER — Ambulatory Visit (HOSPITAL_COMMUNITY): Payer: Medicaid Other | Admitting: Physical Therapy

## 2020-07-02 DIAGNOSIS — M25562 Pain in left knee: Secondary | ICD-10-CM | POA: Diagnosis not present

## 2020-07-02 DIAGNOSIS — R2689 Other abnormalities of gait and mobility: Secondary | ICD-10-CM

## 2020-07-02 DIAGNOSIS — M25662 Stiffness of left knee, not elsewhere classified: Secondary | ICD-10-CM

## 2020-07-02 DIAGNOSIS — M6281 Muscle weakness (generalized): Secondary | ICD-10-CM

## 2020-07-02 NOTE — Therapy (Signed)
[place], [place], [address] Phone: [phone]   Fax:  [phone]  Physical Therapy Treatment  Patient Details  Name: Jamie [name] MRN: [id number] Date of Birth: 1971-02-10 Referring Provider (PT): Frederik Pear MD   Encounter Date: 07/02/2020   PT End of Session - 07/02/20 1518    Visit Number 2    Number of Visits 12    Date for PT Re-Evaluation 08/11/20    Authorization Type UHC Medicaid (no auth for patients over 21, 27 visits per year)    Authorization - Visit Number 1    Authorization - Number of Visits 27    PT Start Time 6962    PT Stop Time 1528    PT Time Calculation (min) 40 min    Equipment Utilized During Treatment Other (comment)   hinge brace   Activity Tolerance Patient tolerated treatment well    Behavior During Therapy [facility] for tasks assessed/performed           Past Medical History:  Diagnosis Date  . Anxiety   . Arthritis   . Chronic back pain   . Chronic knee pain   . GERD (gastroesophageal reflux disease)    occ  . HCV antibody positive   . Hypertension    "Dr Karie Kirks took me off meds"  diet control  . Lumbar radiculopathy   . Pre-diabetes     Past Surgical History:  Procedure Laterality Date  . BACK SURGERY    . CARPAL TUNNEL RELEASE Right 06/25/2013   Procedure: CARPAL TUNNEL RELEASE;  Surgeon: Carole Civil, MD;  Location: [facility];  Service: Orthopedics;  Laterality: Right;  . CARPAL TUNNEL RELEASE Left 07/25/2013   Procedure: LEFT CARPAL TUNNEL RELEASE;  Surgeon: Carole Civil, MD;  Location: [facility];  Service: Orthopedics;  Laterality: Left;  . COLONOSCOPY WITH PROPOFOL N/A 04/05/2017   Procedure: COLONOSCOPY WITH PROPOFOL;  Surgeon: Daneil Dolin, MD;  [facility];  Service: Endoscopy;  Laterality: N/A;  12:30pm-pt notified to arrive at 9:15am for 10:45am procedure per KF  . ELBOW SURGERY Left   . FOOT SURGERY Right   . HERNIA REPAIR Right     inguinal- age 23  . KNEE ARTHROSCOPY WITH MEDIAL MENISECTOMY Left 12/07/2016   Procedure: KNEE ARTHROSCOPY WITH MEDIAL MENISECTOMY;  Surgeon: Carole Civil, MD;  Location: [facility];  Service: Orthopedics;  Laterality: Left;  . KNEE SURGERY    . left elbow    . LUMBAR LAMINECTOMY/DECOMPRESSION MICRODISCECTOMY Left 10/06/2013   Procedure: Left Lumbar Three-four microdiskectomy;  Surgeon: Ophelia Charter, MD;  Location: [place] [facility];  Service: Neurosurgery;  Laterality: Left;  Left Lumbar Three-four microdiskectomy  . REPAIR OF RUPTURED PATELLA LIGAMENT Left 05/03/2020   Procedure: LEFT KNEE PATELLECTOMY;  Surgeon: Frederik Pear, MD;  Location: [facility];  Service: Orthopedics;  Laterality: Left;  . right foot     forgein body removal  . right knee  orif right patella Ke[place] 1993  . SEPTOPLASTY    . TOTAL KNEE ARTHROPLASTY Left 02/18/2019   Procedure: TOTAL KNEE ARTHROPLASTY;  Surgeon: Carole Civil, MD;  Location: [facility];  Service: Orthopedics;  Laterality: Left;    There were no vitals filed for this visit.   Subjective Assessment - 07/02/20 1448    Subjective PT states hhas been doing the exercises  and is feeling better.    Pertinent History L TKA, patellectomy 05/03/20    Limitations Walking;Standing;House hold  activities    Currently in Pain? Yes    Pain Score 3     Pain Location Knee    Pain Orientation Left    Pain Descriptors / Indicators Aching    Pain Type Surgical pain    Pain Onset More than a month ago    Pain Frequency Constant    Aggravating Factors  walking and bending    Pain Relieving Factors ice    Effect of Pain on Daily Activities limits                             [facility] Adult PT Treatment/Exercise - 07/02/20 0001      Exercises   Exercises Knee/Hip      Knee/Hip Exercises: Standing   Heel Raises Both;15 reps    Knee Flexion Strengthening;Left;10 reps   standing on box so pt can bend to 30 degrees   Terminal Knee Extension Left;10  reps    Hip Extension Left;10 reps   3 #   Rocker Board 2 minutes    Other Standing Knee Exercises hip abduction x 15 3#      Knee/Hip Exercises: Supine   Quad Sets Left;10 reps    Quad Sets Limitations quad set at 30 degrees x 10    Short Arc Quad Sets Left;10 reps    Straight Leg Raises Left;10 reps                  PT Education - 07/02/20 1519    Education Details Added HEP    Person(s) Educated Patient    Methods Explanation    Comprehension Verbalized understanding;Returned demonstration            PT Short Term Goals - 07/02/20 1526      PT SHORT TERM GOAL #1   Title Patient will be independent with HEP in order to improve functional outcomes.    Time 3    Period Weeks    Status On-going    Target Date 07/21/20             PT Long Term Goals - 07/02/20 1526      PT LONG TERM GOAL #1   Title Patient will have L knee ROM from 0-60, as protcol states, without symptoms in order to demonstrate improving ROM for transfers.    Time 6    Period Weeks    Status On-going      PT LONG TERM GOAL #2   Title Patient will be able to maintain tandem stance >30 seconds on BLEs to improve stability and reduce risk for falls    Time 6    Period Weeks    Status On-going      PT LONG TERM GOAL #3   Title Patient will have equal to or > 4+/5 MMT throughout LLE to improve ability to perform functional mobility, stair ambulation and ADLs.    Time 6    Period Weeks    Status On-going      PT LONG TERM GOAL #4   Title Patient will be able to ambulate at least 400 feet during 2MWT with LRAD to demonstrate improved ability to perform functional mobility and associated tasks.    Time 6    Period Weeks    Status On-going                 Plan - 07/02/20 1520    Clinical Impression Statement Evaluation  and goals reviewed with ptl.  Therapist added exercises to HEP with verbal cuing to keep abdominals contracted as well as limiting substitution.  PT able to  complete all exercises without increased pain.    Personal Factors and Comorbidities Comorbidity 2;Time since onset of injury/illness/exacerbation;Past/Current Experience;Fitness;Education    Comorbidities HTN, L TKA, L patellectomy on 05/03/20    Examination-Activity Limitations Locomotion Level;Transfers;Squat;Stairs;Stand;Lift    Examination-Participation Restrictions Cleaning;Occupation;Community Activity;Shop;Volunteer;Yard Work    Stability/Clinical Decision Making Stable/Uncomplicated    Rehab Potential Good    PT Frequency 2x / week   1-2x/week   PT Duration 6 weeks    PT Treatment/Interventions ADLs/Self Care Home Management;Aquatic Therapy;Biofeedback;Cryotherapy;Electrical Stimulation;Iontophoresis 4mg /ml Dexamethasone;Moist Heat;Traction;Ultrasound;DME Instruction;Gait training;Stair training;Functional mobility training;Therapeutic activities;Therapeutic exercise;Balance training;Neuromuscular re-education;Patient/family education;Orthotic Fit/Training;Manual techniques;Passive range of motion;Dry needling;Energy conservation;Splinting;Taping;Spinal Manipulations;Joint Manipulations    PT Next Visit Plan Work on heel toe gt. add sidelying hipadduction/  abduction, prone hip extension, prone terminal extension.  Possible manual for edema control, tandem stance. Protocol which states 0-30 x 2 weeks until 4/5 , 0-45 x 2 weeks until 4/19 , 0-60 x 2 weeks until 5/ 3 ; , balance, hip strengthening    PT Home Exercise Plan 06/30/20 quad set at 0 degrees; 2/25; standing heel raises, hip abduction/extension, SAQ and SLR>    Consulted and Agree with Plan of Care Patient           Patient will benefit from skilled therapeutic intervention in order to improve the following deficits and impairments:  Abnormal gait,Decreased range of motion,Difficulty walking,Decreased endurance,Increased muscle spasms,Pain,Decreased activity tolerance,Decreased balance,Impaired flexibility,Improper body  mechanics,Decreased mobility,Decreased strength,Impaired sensation  Visit Diagnosis: Left knee pain, unspecified chronicity  Stiffness of left knee, not elsewhere classified  Muscle weakness (generalized)  Other abnormalities of gait and mobility  Acute pain of left knee     Problem List Patient Active Problem List   Diagnosis Date Noted  . Peri-prosthetic patellar fracture 04/28/2020  . S/P total knee replacement, left 02/18/2019 03/04/2019  . Osteoarthritis of left knee 02/18/2019  . Effusion of knee joint, left 06/03/2018  . S/P left knee arthroscopy 12/07/16 04/13/2017  . Family history of colon cancer 02/28/2017  . Hepatic cirrhosis ([place]) 02/28/2017  . Derangement of posterior horn of medial meniscus of left knee   . Primary osteoarthritis of left knee   . Chondromalacia of medial femoral condyle, left   . Hepatitis C 06/15/2016  . Lumbar herniated disc 10/06/2013  . Carpal tunnel syndrome 07/28/2013  Rayetta Humphrey, PT CLT [phone]  07/02/2020, 3:29 PM  [place] [address] [place], [place], [address] Phone: [phone]   Fax:  [phone]  Name: Jamie [name] MRN: [id number] Date of Birth: 1970/11/02

## 2020-07-05 ENCOUNTER — Ambulatory Visit (HOSPITAL_COMMUNITY): Payer: Medicaid Other

## 2020-07-05 ENCOUNTER — Other Ambulatory Visit: Payer: Self-pay

## 2020-07-05 DIAGNOSIS — M25562 Pain in left knee: Secondary | ICD-10-CM

## 2020-07-05 DIAGNOSIS — M25662 Stiffness of left knee, not elsewhere classified: Secondary | ICD-10-CM

## 2020-07-05 DIAGNOSIS — M6281 Muscle weakness (generalized): Secondary | ICD-10-CM

## 2020-07-05 DIAGNOSIS — R2689 Other abnormalities of gait and mobility: Secondary | ICD-10-CM

## 2020-07-05 NOTE — Patient Instructions (Signed)
Hip Adduction: Leg Lift (Eccentric) - Side-Lying    Lie on side with top leg bent, foot flat behind lower leg. Quickly lift lower leg. Slowly lower for 3-5 seconds. 10-15_ reps per set, 1_ sets per day. Add 1-2___ lbs when you achieve 20_ repetitions.  http://ecce.exer.us/74   Copyright  VHI. All rights reserved.   HIP: Abduction - Side-Lying    Lie on side, legs straight and in line with trunk. Straight left knee. Squeeze glutes. Raise top leg up and slightly back. Point heel towards the ceiling. 10-15_ reps per set, _1_ sets per day. Bend bottom leg to stabilize pelvis.  Copyright  VHI. All rights reserved.   Straight Leg Raise (Prone)    Abdomen and head supported, keep left knee locked and raise leg at hip. Avoid arching low back. Repeat 10-15_ times per set. Do 1 sets per session. Do _1_ sessions per day.  http://orth.exer.us/1112   Copyright  VHI. All rights reserved.   Quadriceps Set (Prone)    With toes supporting lower legs, tighten thigh muscles to straighten knees. Hold 3-5__ seconds. Relax. Repeat 10-15__ times per set. Do _1_ sets per session. Do _1_ sessions per day.  http://orth.exer.us/726   Copyright  VHI. All rights reserved.

## 2020-07-05 NOTE — Therapy (Signed)
[place], [place], [address] Phone: [phone]   Fax:  [phone]  Physical Therapy Treatment  Patient Details  Name: Jamie [name] MRN: [id number] Date of Birth: 05-08-1970 Referring Provider (PT): Frederik Pear MD   Encounter Date: 07/05/2020   PT End of Session - 07/05/20 1128    Visit Number 3    Number of Visits 12    Date for PT Re-Evaluation 08/11/20    Authorization Type UHC Medicaid (no auth for patients over 21, 27 visits per year)    Authorization - Visit Number 2    Authorization - Number of Visits 27    PT Start Time 1107    PT Stop Time 1150    PT Time Calculation (min) 43 min    Equipment Utilized During Treatment Other (comment)   hinge brace   Activity Tolerance Patient tolerated treatment well    Behavior During Therapy WFL for tasks assessed/performed           Past Medical History:  Diagnosis Date  . Anxiety   . Arthritis   . Chronic back pain   . Chronic knee pain   . GERD (gastroesophageal reflux disease)    occ  . HCV antibody positive   . Hypertension    "Dr Karie Kirks took me off meds"  diet control  . Lumbar radiculopathy   . Pre-diabetes     Past Surgical History:  Procedure Laterality Date  . BACK SURGERY    . CARPAL TUNNEL RELEASE Right 06/25/2013   Procedure: CARPAL TUNNEL RELEASE;  Surgeon: Carole Civil, MD;  [facility];  Service: Orthopedics;  Laterality: Right;  . CARPAL TUNNEL RELEASE Left 07/25/2013   Procedure: LEFT CARPAL TUNNEL RELEASE;  Surgeon: Carole Civil, MD;  Location: [facility];  Service: Orthopedics;  Laterality: Left;  . COLONOSCOPY WITH PROPOFOL N/A 04/05/2017   Procedure: COLONOSCOPY WITH PROPOFOL;  Surgeon: Daneil Dolin, MD;  [facility];  Service: Endoscopy;  Laterality: N/A;  12:30pm-pt notified to arrive at 9:15am for 10:45am procedure per KF  . ELBOW SURGERY Left   . FOOT SURGERY Right   . HERNIA REPAIR Right     inguinal- age 55  . KNEE ARTHROSCOPY WITH MEDIAL MENISECTOMY Left 12/07/2016   Procedure: KNEE ARTHROSCOPY WITH MEDIAL MENISECTOMY;  Surgeon: Carole Civil, MD;  Location: [facility];  Service: Orthopedics;  Laterality: Left;  . KNEE SURGERY    . left elbow    . LUMBAR LAMINECTOMY/DECOMPRESSION MICRODISCECTOMY Left 10/06/2013   Procedure: Left Lumbar Three-four microdiskectomy;  Surgeon: Ophelia Charter, MD;  Location: [place] [facility];  Service: Neurosurgery;  Laterality: Left;  Left Lumbar Three-four microdiskectomy  . REPAIR OF RUPTURED PATELLA LIGAMENT Left 05/03/2020   Procedure: LEFT KNEE PATELLECTOMY;  Surgeon: Frederik Pear, MD;  [facility]: [facility];  Service: Orthopedics;  Laterality: Left;  . right foot     forgein body removal  . right knee  orif right patella Ke[place] 1993  . SEPTOPLASTY    . TOTAL KNEE ARTHROPLASTY Left 02/18/2019   Procedure: TOTAL KNEE ARTHROPLASTY;  Surgeon: Carole Civil, MD;  Location: [facility];  Service: Orthopedics;  Laterality: Left;    There were no vitals filed for this visit.   Subjective Assessment - 07/05/20 1117    Subjective Patient continues to perform HEP. Has kept his crutches in case he needs them when brace motion gets opened up some more.    Pertinent History  L TKA, patellectomy 05/03/20    Limitations Walking;Standing;House hold activities    Currently in Pain? Yes    Pain Score 3     Pain Location Knee    Pain Orientation Left;Anterior;Upper    Pain Onset More than a month ago            [facility] Adult PT Treatment/Exercise - 07/05/20 0001      Ambulation/Gait   Ambulation/Gait Yes    Ambulation/Gait Assistance 6: Modified independent (Device/Increase time)    Ambulation Distance (Feet) 226 Feet    Assistive device Straight cane    Gait Pattern Left flexed knee in stance;Antalgic;Decreased hip/knee flexion - left    Ambulation Surface Level;Indoor      Exercises   Exercises Knee/Hip      Knee/Hip Exercises: Standing   Heel  Raises Both;15 reps   5 sec hold   Knee Flexion Strengthening;Left;15 reps   standing on box so pt can bend to 30 degrees   Terminal Knee Extension Left;10 reps    Hip Extension Left;15 reps   3 #   Rocker Board 2 minutes    Other Standing Knee Exercises hip abduction x 15 3#      Knee/Hip Exercises: Supine   Quad Sets Left;15 reps    Quad Sets Limitations 5 sec hold    Short Arc Quad Sets Left;10 reps    Short Arc Quad Sets Limitations 5 sec hold    Terminal Knee Extension Strengthening;Left;1 set;15 reps    Terminal Knee Extension Limitations 5 sec hold    Straight Leg Raises Left;15 reps    Straight Leg Raises Limitations 5 sec hold      Knee/Hip Exercises: Sidelying   Hip ABduction Strengthening;Left;1 set;10 reps    Hip ADduction Strengthening;Left;1 set;10 reps      Knee/Hip Exercises: Prone   Hip Extension --    Straight Leg Raises Strengthening;Left;1 set;10 reps    Other Prone Exercises TKE 5 sec hold x10             PT Education - 07/05/20 1127    Education Details Discussed purpose and technique of skilled interventions throughout session. Advanced HEP.    Person(s) Educated Patient    Methods Explanation    Comprehension Verbalized understanding            PT Short Term Goals - 07/02/20 1526      PT SHORT TERM GOAL #1   Title Patient will be independent with HEP in order to improve functional outcomes.    Time 3    Period Weeks    Status On-going    Target Date 07/21/20             PT Long Term Goals - 07/02/20 1526      PT LONG TERM GOAL #1   Title Patient will have L knee ROM from 0-60, as protcol states, without symptoms in order to demonstrate improving ROM for transfers.    Time 6    Period Weeks    Status On-going      PT LONG TERM GOAL #2   Title Patient will be able to maintain tandem stance >30 seconds on BLEs to improve stability and reduce risk for falls    Time 6    Period Weeks    Status On-going      PT LONG TERM GOAL #3    Title Patient will have equal to or > 4+/5 MMT throughout LLE to improve ability to perform  functional mobility, stair ambulation and ADLs.    Time 6    Period Weeks    Status On-going      PT LONG TERM GOAL #4   Title Patient will be able to ambulate at least 400 feet during 2MWT with LRAD to demonstrate improved ability to perform functional mobility and associated tasks.    Time 6    Period Weeks    Status On-going             Plan - 07/05/20 1129    Clinical Impression Statement Session focused on left lower extremity strengthening. Advanced therapeutic exercises by increasing from 10 reps to 15 or adding 5 sec hold to exercises. Added sidelying hip abduction and adduction, prone TKE and prone SLR to exercises. Added these to HEP as well. Patient able to complete all exercises without increased pain. Muscle fatigue noted during therapeutic exercises.    Personal Factors and Comorbidities Comorbidity 2;Time since onset of injury/illness/exacerbation;Past/Current Experience;Fitness;Education    Comorbidities HTN, L TKA, L patellectomy on 05/03/20    Examination-Activity Limitations Locomotion Level;Transfers;Squat;Stairs;Stand;Lift    Examination-Participation Restrictions Cleaning;Occupation;Community Activity;Shop;Volunteer;Yard Work    Stability/Clinical Decision Making Stable/Uncomplicated    Rehab Potential Good    PT Frequency 2x / week   1-2x/week   PT Duration 6 weeks    PT Treatment/Interventions ADLs/Self Care Home Management;Aquatic Therapy;Biofeedback;Cryotherapy;Electrical Stimulation;Iontophoresis 4mg /ml Dexamethasone;Moist Heat;Traction;Ultrasound;DME Instruction;Gait training;Stair training;Functional mobility training;Therapeutic activities;Therapeutic exercise;Balance training;Neuromuscular re-education;Patient/family education;Orthotic Fit/Training;Manual techniques;Passive range of motion;Dry needling;Energy conservation;Splinting;Taping;Spinal Manipulations;Joint  Manipulations    PT Next Visit Plan Work on heel toe gt. add sidelying hipadduction/  abduction, prone hip extension, prone terminal extension.  Possible manual for edema control, tandem stance. Protocol which states 0-30 x 2 weeks until 4/5, 0-45 x 2 weeks until 4/19, 0-60 x 2 weeks until 5/ 3; balance, hip strengthening    PT Home Exercise Plan 06/30/20 quad set at 0 degrees; 2/25; standing heel raises, hip abduction/extension, SAQ and SLR; 07/05/20 - sidelying hip add/abd, prone hip ext, prone TKE    Consulted and Agree with Plan of Care Patient           Patient will benefit from skilled therapeutic intervention in order to improve the following deficits and impairments:  Abnormal gait,Decreased range of motion,Difficulty walking,Decreased endurance,Increased muscle spasms,Pain,Decreased activity tolerance,Decreased balance,Impaired flexibility,Improper body mechanics,Decreased mobility,Decreased strength,Impaired sensation  Visit Diagnosis: Left knee pain, unspecified chronicity  Stiffness of left knee, not elsewhere classified  Muscle weakness (generalized)  Other abnormalities of gait and mobility  Acute pain of left knee     Problem List Patient Active Problem List   Diagnosis Date Noted  . Peri-prosthetic patellar fracture 04/28/2020  . S/P total knee replacement, left 02/18/2019 03/04/2019  . Osteoarthritis of left knee 02/18/2019  . Effusion of knee joint, left 06/03/2018  . S/P left knee arthroscopy 12/07/16 04/13/2017  . Family history of colon cancer 02/28/2017  . Hepatic cirrhosis ([place]) 02/28/2017  . Derangement of posterior horn of medial meniscus of left knee   . Primary osteoarthritis of left knee   . Chondromalacia of medial femoral condyle, left   . Hepatitis C 06/15/2016  . Lumbar herniated disc 10/06/2013  . Carpal tunnel syndrome 07/28/2013   Floria Raveling. Hartnett-Rands, MS, PT Per [place] [phone]  Jeannie Done 07/05/2020, 12:00 PM  [place] [address] [place], [place], [address] Phone: [phone]   Fax:  [phone]  Name: Jamie [name] MRN:  [id number] Date of Birth: Feb 27, 1971

## 2020-07-07 ENCOUNTER — Telehealth (HOSPITAL_COMMUNITY): Payer: Self-pay

## 2020-07-07 ENCOUNTER — Ambulatory Visit (HOSPITAL_COMMUNITY): Payer: Medicaid Other

## 2020-07-07 NOTE — Telephone Encounter (Signed)
Pt stated he is feeling achey all over, wishes to cancel for today.  Reminded next apt date and time wiht contact information if needs to reschedule/cancel.  Ihor Austin, LPTA/CLT; Delana Meyer [phone]

## 2020-07-09 ENCOUNTER — Encounter (HOSPITAL_COMMUNITY): Payer: Medicaid Other

## 2020-07-12 ENCOUNTER — Other Ambulatory Visit: Payer: Self-pay

## 2020-07-12 ENCOUNTER — Ambulatory Visit (HOSPITAL_COMMUNITY): Payer: Medicaid Other | Attending: Orthopedic Surgery | Admitting: Physical Therapy

## 2020-07-12 DIAGNOSIS — R2689 Other abnormalities of gait and mobility: Secondary | ICD-10-CM

## 2020-07-12 DIAGNOSIS — M25662 Stiffness of left knee, not elsewhere classified: Secondary | ICD-10-CM

## 2020-07-12 DIAGNOSIS — M6281 Muscle weakness (generalized): Secondary | ICD-10-CM | POA: Diagnosis present

## 2020-07-12 DIAGNOSIS — M25562 Pain in left knee: Secondary | ICD-10-CM

## 2020-07-12 NOTE — Therapy (Signed)
[place], [place], [address] Phone: [phone]   Fax:  [phone]  Physical Therapy Treatment  Patient Details  Name: Jamie [name] MRN: [id number] Date of Birth: 1971-02-27 Referring Provider (PT): Frederik Pear MD   Encounter Date: 07/12/2020   PT End of Session - 07/12/20 1016    Visit Number 4    Number of Visits 12    Date for PT Re-Evaluation 08/11/20    Authorization Type U[employer] Medicaid (no auth for patients over 21, 27 visits per year)    Authorization - Visit Number 3    Authorization - Number of Visits 27    PT Start Time 0915    PT Stop Time 1000    PT Time Calculation (min) 45 min    Equipment Utilized During Treatment Other (comment)   hinge brace   Activity Tolerance Patient tolerated treatment well    Behavior During Therapy [facility] for tasks assessed/performed           Past Medical History:  Diagnosis Date  . Anxiety   . Arthritis   . Chronic back pain   . Chronic knee pain   . GERD (gastroesophageal reflux disease)    occ  . HCV antibody positive   . Hypertension    "Dr Karie Kirks took me off meds"  diet control  . Lumbar radiculopathy   . Pre-diabetes     Past Surgical History:  Procedure Laterality Date  . BACK SURGERY    . CARPAL TUNNEL RELEASE Right 06/25/2013   Procedure: CARPAL TUNNEL RELEASE;  Surgeon: Carole Civil, MD;  [facility]: [facility];  Service: Orthopedics;  Laterality: Right;  . CARPAL TUNNEL RELEASE Left 07/25/2013   Procedure: LEFT CARPAL TUNNEL RELEASE;  Surgeon: Carole Civil, MD;  Location: [facility];  Service: Orthopedics;  Laterality: Left;  . COLONOSCOPY WITH PROPOFOL N/A 04/05/2017   Procedure: COLONOSCOPY WITH PROPOFOL;  Surgeon: Daneil Dolin, MD;  [facility];  Service: Endoscopy;  Laterality: N/A;  12:30pm-pt notified to arrive at 9:15am for 10:45am procedure per KF  . ELBOW SURGERY Left   . FOOT SURGERY Right   . HERNIA REPAIR Right     inguinal- age 1  . KNEE ARTHROSCOPY WITH MEDIAL MENISECTOMY Left 12/07/2016   Procedure: KNEE ARTHROSCOPY WITH MEDIAL MENISECTOMY;  Surgeon: Carole Civil, MD;  Location: [facility];  Service: Orthopedics;  Laterality: Left;  . KNEE SURGERY    . left elbow    . LUMBAR LAMINECTOMY/DECOMPRESSION MICRODISCECTOMY Left 10/06/2013   Procedure: Left Lumbar Three-four microdiskectomy;  Surgeon: Ophelia Charter, MD;  Location: [facility];  Service: Neurosurgery;  Laterality: Left;  Left Lumbar Three-four microdiskectomy  . REPAIR OF RUPTURED PATELLA LIGAMENT Left 05/03/2020   Procedure: LEFT KNEE PATELLECTOMY;  Surgeon: Frederik Pear, MD;  Location: [facility];  Service: Orthopedics;  Laterality: Left;  . right foot     forgein body removal  . right knee  orif right patella Keeling 1993  . SEPTOPLASTY    . TOTAL KNEE ARTHROPLASTY Left 02/18/2019   Procedure: TOTAL KNEE ARTHROPLASTY;  Surgeon: Carole Civil, MD;  Location: [facility];  Service: Orthopedics;  Laterality: Left;    There were no vitals filed for this visit.   Subjective Assessment - 07/12/20 0920    Subjective pt states  he's been keeping is brace on most all the time.  Comes today using SPC for gait.  Reports "just a tad" of pain.  Currently in Pain? Yes    Pain Score 3     Pain Location Knee    Pain Orientation Left;Anterior    Pain Descriptors / Indicators Aching    Pain Type Surgical pain                             [facility] Adult PT Treatment/Exercise - 07/12/20 0001      Knee/Hip Exercises: Standing   Heel Raises Both;15 reps   5 second hold   Knee Flexion Strengthening;Left;15 reps   brace locked at 50 degrees   Hip Abduction Both;15 reps    Hip Extension 15 reps;Both    Rocker Board 2 minutes    Rocker Board Limitations A/P and Rt/Lt each 2'    SLS 30" X 3 no UE Lt      Knee/Hip Exercises: Supine   Quad Sets Left;15 reps    Straight Leg Raises Left;15 reps    Straight Leg Raises Limitations 5  sec hold    Knee Extension AROM    Knee Extension Limitations 0    Knee Flexion AROM    Knee Flexion Limitations 50   can bend to 70 degrees                   PT Short Term Goals - 07/02/20 1526      PT SHORT TERM GOAL #1   Title Patient will be independent with HEP in order to improve functional outcomes.    Time 3    Period Weeks    Status On-going    Target Date 07/21/20             PT Long Term Goals - 07/02/20 1526      PT LONG TERM GOAL #1   Title Patient will have L knee ROM from 0-60, as protcol states, without symptoms in order to demonstrate improving ROM for transfers.    Time 6    Period Weeks    Status On-going      PT LONG TERM GOAL #2   Title Patient will be able to maintain tandem stance >30 seconds on BLEs to improve stability and reduce risk for falls    Time 6    Period Weeks    Status On-going      PT LONG TERM GOAL #3   Title Patient will have equal to or > 4+/5 MMT throughout LLE to improve ability to perform functional mobility, stair ambulation and ADLs.    Time 6    Period Weeks    Status On-going      PT LONG TERM GOAL #4   Title Patient will be able to ambulate at least 400 feet during 2MWT with LRAD to demonstrate improved ability to perform functional mobility and associated tasks.    Time 6    Period Weeks    Status On-going                 Plan - 07/12/20 1015    Clinical Impression Statement Continued focus on improving hip/knee AROM, strength and stability.  Brace moved to 50 degrees today per protocol (45% but brace only moves in 10% increments).  Increased stability exercises with addition of vectors and SLS.  Pt able to complete full 30" without UE but with noted challenge.  Pt is to return to MD tomorrow and hopes he can start bending his knee more and wearing his brace less.  AROM 0-50 degrees however pt able to bend knee to 70 degrees and then has lateral knee tightness.  Pt to confirm his ROM restrictions at  his MD appt tomorrow.  Instructed with heel toe gait at [facility].    Personal Factors and Comorbidities Comorbidity 2;Time since onset of injury/illness/exacerbation;Past/Current Experience;Fitness;Education    Comorbidities HTN, L TKA, L patellectomy on 05/03/20    Examination-Activity Limitations Locomotion Level;Transfers;Squat;Stairs;Stand;Lift    Examination-Participation Restrictions Cleaning;Occupation;Community Activity;Shop;Volunteer;Yard Work    Stability/Clinical Decision Making Stable/Uncomplicated    Rehab Potential Good    PT Frequency 2x / week   1-2x/week   PT Duration 6 weeks    PT Treatment/Interventions ADLs/Self Care Home Management;Aquatic Therapy;Biofeedback;Cryotherapy;Electrical Stimulation;Iontophoresis 4mg /ml Dexamethasone;Moist Heat;Traction;Ultrasound;DME Instruction;Gait training;Stair training;Functional mobility training;Therapeutic activities;Therapeutic exercise;Balance training;Neuromuscular re-education;Patient/family education;Orthotic Fit/Training;Manual techniques;Passive range of motion;Dry needling;Energy conservation;Splinting;Taping;Spinal Manipulations;Joint Manipulations    PT Next Visit Plan F/U on MD appt.  Continue to improve ROM and manual as needed.  Protocol which states 0-30 x 2 weeks until 4/5, 0-45 x 2 weeks until 4/19, 0-60 x 2 weeks until 5/ 3; balance, hip strengthening    PT Home Exercise Plan 06/30/20 quad set at 0 degrees; 2/25; standing heel raises, hip abduction/extension, SAQ and SLR; 07/05/20 - sidelying hip add/abd, prone hip ext, prone TKE    Consulted and Agree with Plan of Care Patient           Patient will benefit from skilled therapeutic intervention in order to improve the following deficits and impairments:  Abnormal gait,Decreased range of motion,Difficulty walking,Decreased endurance,Increased muscle spasms,Pain,Decreased activity tolerance,Decreased balance,Impaired flexibility,Improper body mechanics,Decreased mobility,Decreased  strength,Impaired sensation  Visit Diagnosis: Left knee pain, unspecified chronicity  Stiffness of left knee, not elsewhere classified  Muscle weakness (generalized)  Other abnormalities of gait and mobility  Acute pain of left knee     Problem List Patient Active Problem List   Diagnosis Date Noted  . Peri-prosthetic patellar fracture 04/28/2020  . S/P total knee replacement, left 02/18/2019 03/04/2019  . Osteoarthritis of left knee 02/18/2019  . Effusion of knee joint, left 06/03/2018  . S/P left knee arthroscopy 12/07/16 04/13/2017  . Family history of colon cancer 02/28/2017  . Hepatic cirrhosis ([place]) 02/28/2017  . Derangement of posterior horn of medial meniscus of left knee   . Primary osteoarthritis of left knee   . Chondromalacia of medial femoral condyle, left   . Hepatitis C 06/15/2016  . Lumbar herniated disc 10/06/2013  . Carpal tunnel syndrome 07/28/2013   Teena Irani, PTA/CLT [phone]  Teena Irani 07/12/2020, 10:17 AM  [address] [place], [place], [address] Phone: [phone]   Fax:  [phone]  Name: Jamie [name] MRN: [id number] Date of Birth: [birth year]

## 2020-07-14 ENCOUNTER — Ambulatory Visit (HOSPITAL_COMMUNITY): Payer: Medicaid Other | Admitting: Physical Therapy

## 2020-07-16 ENCOUNTER — Encounter (HOSPITAL_COMMUNITY): Payer: Self-pay

## 2020-07-16 ENCOUNTER — Ambulatory Visit (HOSPITAL_COMMUNITY): Payer: Medicaid Other

## 2020-07-16 ENCOUNTER — Encounter (HOSPITAL_COMMUNITY): Payer: Medicaid Other | Admitting: Physical Therapy

## 2020-07-16 ENCOUNTER — Other Ambulatory Visit: Payer: Self-pay

## 2020-07-16 DIAGNOSIS — M6281 Muscle weakness (generalized): Secondary | ICD-10-CM

## 2020-07-16 DIAGNOSIS — M25562 Pain in left knee: Secondary | ICD-10-CM

## 2020-07-16 DIAGNOSIS — M25662 Stiffness of left knee, not elsewhere classified: Secondary | ICD-10-CM

## 2020-07-16 DIAGNOSIS — R2689 Other abnormalities of gait and mobility: Secondary | ICD-10-CM

## 2020-07-16 NOTE — Therapy (Signed)
[name], [place], [address] Phone: [phone]   Fax:  [phone]  Physical Therapy Treatment  Patient Details  Name: Jamie [name] MRN: [id number] Date of Birth: 10/01/1970 Referring Provider (PT): Frederik Pear MD   Encounter Date: 07/16/2020   PT End of Session - 07/16/20 1142    Visit Number 5    Number of Visits 12    Date for PT Re-Evaluation 08/11/20    Authorization Type UHC Medicaid (no auth for patients over 21, 27 visits per year)    Authorization - Visit Number 4    Authorization - Number of Visits 27    PT Start Time 1136    PT Stop Time 1219    PT Time Calculation (min) 43 min    Equipment Utilized During Treatment Other (comment)   hinge brace   Activity Tolerance Patient tolerated treatment well    Behavior During Therapy [facility] for tasks assessed/performed           Past Medical History:  Diagnosis Date  . Anxiety   . Arthritis   . Chronic back pain   . Chronic knee pain   . GERD (gastroesophageal reflux disease)    occ  . HCV antibody positive   . Hypertension    "Dr Karie Kirks took me off meds"  diet control  . Lumbar radiculopathy   . Pre-diabetes     Past Surgical History:  Procedure Laterality Date  . BACK SURGERY    . CARPAL TUNNEL RELEASE Right 06/25/2013   Procedure: CARPAL TUNNEL RELEASE;  Surgeon: Carole Civil, MD;  [facility];  Service: Orthopedics;  Laterality: Right;  . CARPAL TUNNEL RELEASE Left 07/25/2013   Procedure: LEFT CARPAL TUNNEL RELEASE;  Surgeon: Carole Civil, MD;  Location: [facility];  Service: Orthopedics;  Laterality: Left;  . COLONOSCOPY WITH PROPOFOL N/A 04/05/2017   Procedure: COLONOSCOPY WITH PROPOFOL;  Surgeon: Daneil Dolin, MD;  [facility];  Service: Endoscopy;  Laterality: N/A;  12:30pm-pt notified to arrive at 9:15am for 10:45am procedure per KF  . ELBOW SURGERY Left   . FOOT SURGERY Right   . HERNIA REPAIR Right     inguinal- age 55  . KNEE ARTHROSCOPY WITH MEDIAL MENISECTOMY Left 12/07/2016   Procedure: KNEE ARTHROSCOPY WITH MEDIAL MENISECTOMY;  Surgeon: Carole Civil, MD;  Location[facility];  Service: Orthopedics;  Laterality: Left;  . KNEE SURGERY    . left elbow    . LUMBAR LAMINECTOMY/DECOMPRESSION MICRODISCECTOMY Left 10/06/2013   Procedure: Left Lumbar Three-four microdiskectomy;  Surgeon: Ophelia Charter, MD;  Location: [facility];  Service: Neurosurgery;  Laterality: Left;  Left Lumbar Three-four microdiskectomy  . REPAIR OF RUPTURED PATELLA LIGAMENT Left 05/03/2020   Procedure: LEFT KNEE PATELLECTOMY;  Surgeon: Frederik Pear, MD;  Location[facility];  Service: Orthopedics;  Laterality: Left;  . right foot     forgein body removal  . right knee  orif right patella Keeling 1993  . SEPTOPLASTY    . TOTAL KNEE ARTHROPLASTY Left 02/18/2019   Procedure: TOTAL KNEE ARTHROPLASTY;  Surgeon: Carole Civil, MD;  Location[facility];  Service: Orthopedics;  Laterality: Left;    There were no vitals filed for this visit.   Subjective Assessment - 07/16/20 1140    Subjective Pt went to MD on 07/13/20 and brought in written instruction to leave brace at 45 degrees for 2 weeks then progress to 0-60 degrees in 2  weeks.    Pertinent History L TKA, patellectomy 05/03/20    Currently in Pain? Yes    Pain Score 2     Pain Location Knee    Pain Orientation Left;Anterior    Pain Descriptors / Indicators Aching;Sore    Pain Type Surgical pain    Pain Onset More than a month ago    Pain Frequency Constant    Aggravating Factors  walking and bending    Pain Relieving Factors ice    Effect of Pain on Daily Activities limits                             [facility] Adult PT Treatment/Exercise - 07/16/20 0001      Exercises   Exercises Knee/Hip      Knee/Hip Exercises: Standing   Heel Raises Both;15 reps;5 seconds    Heel Raises Limitations Toe raises 15x 5" holds    Knee Flexion  Strengthening;Left;15 reps    Knee Flexion Limitations brace set on 50 degrees    Terminal Knee Extension 15 reps;Theraband    Theraband Level (Terminal Knee Extension) Level 2 (Red)    Terminal Knee Extension Limitations 5" holds    Hip Abduction Both;15 reps    Abduction Limitations 3#    Hip Extension 15 reps;Both    Functional Squat Limitations 10 minisquat with brace on and UE support    SLS with Vectors 5x 5"      Knee/Hip Exercises: Seated   Sit to Sand 10 reps;2 sets   elevated height, Lt further out due to brace 50 degrees     Knee/Hip Exercises: Supine   Quad Sets Left;15 reps    Short Arc [employer] Left;15 reps    Short Arc Quad Sets Limitations 5" holds    Straight Leg Raises Left;15 reps    Straight Leg Raises Limitations 5 sec hold                    PT Short Term Goals - 07/02/20 1526      PT SHORT TERM GOAL #1   Title Patient will be independent with HEP in order to improve functional outcomes.    Time 3    Period Weeks    Status On-going    Target Date 07/21/20             PT Long Term Goals - 07/02/20 1526      PT LONG TERM GOAL #1   Title Patient will have L knee ROM from 0-60, as protcol states, without symptoms in order to demonstrate improving ROM for transfers.    Time 6    Period Weeks    Status On-going      PT LONG TERM GOAL #2   Title Patient will be able to maintain tandem stance >30 seconds on BLEs to improve stability and reduce risk for falls    Time 6    Period Weeks    Status On-going      PT LONG TERM GOAL #3   Title Patient will have equal to or > 4+/5 MMT throughout LLE to improve ability to perform functional mobility, stair ambulation and ADLs.    Time 6    Period Weeks    Status On-going      PT LONG TERM GOAL #4   Title Patient will be able to ambulate at least 400 feet during 2MWT with LRAD to demonstrate improved ability  to perform functional mobility and associated tasks.    Time 6    Period Weeks     Status On-going                 Plan - 07/16/20 1144    Clinical Impression Statement Pt brought in written instructed from MD to continues with brace at 0-45(50 degrees as brace moves in 10% increments) for 2 more weeks then progress to 0-60 on 419/22.  Session focus wiht quad and hip strengthening exercises.  Progressed to vector stance 5" holds for hip stability as well as STS elevated height and minisquat.  PT able to complete all exercises with good form.  No reports of pain through session.  Did reports some fatigue with hip exercises.  Brace wore throught session wiht supine and standing exercises.    Personal Factors and Comorbidities Comorbidity 2;Time since onset of injury/illness/exacerbation;Past/Current Experience;Fitness;Education    Comorbidities HTN, L TKA, L patellectomy on 05/03/20    Examination-Activity Limitations Locomotion Level;Transfers;Squat;Stairs;Stand;Lift    Examination-Participation Restrictions Cleaning;Occupation;Community Activity;Shop;Volunteer;Yard Work    Stability/Clinical Decision Making Stable/Uncomplicated    Designer, [employer]ell[employer]    Rehab Potential Good    PT Frequency 2x / week   1-2x/ week   PT Duration 6 weeks    PT Treatment/Interventions ADLs/Self Care Home Management;Aquatic Therapy;Biofeedback;Cryotherapy;Electrical Stimulation;Iontophoresis 4mg /ml Dexamethasone;Moist Heat;Traction;Ultrasound;DME Instruction;Gait training;Stair training;Functional mobility training;Therapeutic activities;Therapeutic exercise;Balance training;Neuromuscular re-education;Patient/family education;Orthotic Fit/Training;Manual techniques;Passive range of motion;Dry needling;Energy conservation;Splinting;Taping;Spinal Manipulations;Joint Manipulations    PT Next Visit Plan F/U on MD appt.  Continue to improve ROM and manual as needed.  Protocol which states 0-30 x 2 weeks until 4/5, 0-45 x 2 weeks until 4/19, 0-60 x 2 weeks until 5/ 3; balance, hip strengthening     PT Home Exercise Plan 06/30/20 quad set at 0 degrees; 2/25; standing heel raises, hip abduction/extension, SAQ and SLR; 07/05/20 - sidelying hip add/abd, prone hip ext, prone TKE    Consulted and Agree with Plan of Care Patient           Patient will benefit from skilled therapeutic intervention in order to improve the following deficits and impairments:  Abnormal gait,Decreased range of motion,Difficulty walking,Decreased endurance,Increased muscle spasms,Pain,Decreased activity tolerance,Decreased balance,Impaired flexibility,Improper body mechanics,Decreased mobility,Decreased strength,Impaired sensation  Visit Diagnosis: Left knee pain, unspecified chronicity  Stiffness of left knee, not elsewhere classified  Muscle weakness (generalized)  Other abnormalities of gait and mobility     Problem List Patient Active Problem List   Diagnosis Date Noted  . Peri-prosthetic patellar fracture 04/28/2020  . S/P total knee replacement, left 02/18/2019 03/04/2019  . Osteoarthritis of left knee 02/18/2019  . Effusion of knee joint, left 06/03/2018  . S/P left knee arthroscopy 12/07/16 04/13/2017  . Family history of colon cancer 02/28/2017  . Hepatic cirrhosis ([place]) 02/28/2017  . Derangement of posterior horn of medial meniscus of left knee   . Primary osteoarthritis of left knee   . Chondromalacia of medial femoral condyle, left   . Hepatitis C 06/15/2016  . Lumbar herniated disc 10/06/2013  . Carpal tunnel syndrome 07/28/2013   Ihor Austin, LPTA/CLT; CBIS [phone]  Aldona Lento 07/16/2020, 1:13 PM  [place], [place], [address] Phone: [phone]   Fax:  [phone]  Name: Jamie [name] MRN: [id number] Date of Birth: 1971/04/03

## 2020-07-19 ENCOUNTER — Other Ambulatory Visit: Payer: Self-pay

## 2020-07-19 ENCOUNTER — Encounter (HOSPITAL_COMMUNITY): Payer: Self-pay | Admitting: Physical Therapy

## 2020-07-19 ENCOUNTER — Ambulatory Visit (HOSPITAL_COMMUNITY): Payer: Medicaid Other | Admitting: Physical Therapy

## 2020-07-19 DIAGNOSIS — R2689 Other abnormalities of gait and mobility: Secondary | ICD-10-CM

## 2020-07-19 DIAGNOSIS — M25562 Pain in left knee: Secondary | ICD-10-CM | POA: Diagnosis not present

## 2020-07-19 DIAGNOSIS — M25662 Stiffness of left knee, not elsewhere classified: Secondary | ICD-10-CM

## 2020-07-19 DIAGNOSIS — M6281 Muscle weakness (generalized): Secondary | ICD-10-CM

## 2020-07-19 NOTE — Therapy (Signed)
[place], [place], [address] Phone: [phone]   Fax:  [phone]  Physical Therapy Treatment  Patient Details  Name: Jamie [name] MRN: [id number] Date of Birth: 02-11-1971 Referring Provider (PT): Frederik Pear MD   Encounter Date: 07/19/2020   PT End of Session - 07/19/20 1003    Visit Number 6    Number of Visits 12    Date for PT Re-Evaluation 08/11/20    Authorization Type UHC Medicaid (no auth for patients over 21, 27 visits per year)    Authorization - Visit Number 5    Authorization - Number of Visits 27    PT Start Time 1003    PT Stop Time 1041    PT Time Calculation (min) 38 min    Equipment Utilized During Treatment Other (comment)   hinge brace   Activity Tolerance Patient tolerated treatment well    Behavior During Therapy WFL for tasks assessed/performed           Past Medical History:  Diagnosis Date  . Anxiety   . Arthritis   . Chronic back pain   . Chronic knee pain   . GERD (gastroesophageal reflux disease)    occ  . HCV antibody positive   . Hypertension    "Dr Karie Kirks took me off meds"  diet control  . Lumbar radiculopathy   . Pre-diabetes     Past Surgical History:  Procedure Laterality Date  . BACK SURGERY    . CARPAL TUNNEL RELEASE Right 06/25/2013   Procedure: CARPAL TUNNEL RELEASE;  Surgeon: Carole Civil, MD;  [facility]: [facility];  Service: Orthopedics;  Laterality: Right;  . CARPAL TUNNEL RELEASE Left 07/25/2013   Procedure: LEFT CARPAL TUNNEL RELEASE;  Surgeon: Carole Civil, MD;  Location: [facility];  Service: Orthopedics;  Laterality: Left;  . COLONOSCOPY WITH PROPOFOL N/A 04/05/2017   Procedure: COLONOSCOPY WITH PROPOFOL;  Surgeon: Daneil Dolin, MD;  [facility];  Service: Endoscopy;  Laterality: N/A;  12:30pm-pt notified to arrive at 9:15am for 10:45am procedure per KF  . ELBOW SURGERY Left   . FOOT SURGERY Right   . HERNIA REPAIR Right     inguinal- age 60  . KNEE ARTHROSCOPY WITH MEDIAL MENISECTOMY Left 12/07/2016   Procedure: KNEE ARTHROSCOPY WITH MEDIAL MENISECTOMY;  Surgeon: Carole Civil, MD;  Location: [facility];  Service: Orthopedics;  Laterality: Left;  . KNEE SURGERY    . left elbow    . LUMBAR LAMINECTOMY/DECOMPRESSION MICRODISCECTOMY Left 10/06/2013   Procedure: Left Lumbar Three-four microdiskectomy;  Surgeon: Ophelia Charter, MD;  Location: [facility];  Service: Neurosurgery;  Laterality: Left;  Left Lumbar Three-four microdiskectomy  . REPAIR OF RUPTURED PATELLA LIGAMENT Left 05/03/2020   Procedure: LEFT KNEE PATELLECTOMY;  Surgeon: Frederik Pear, MD;  Location: [facility];  Service: Orthopedics;  Laterality: Left;  . right foot     forgein body removal  . right knee  orif right patella Ke[place] 1993  . SEPTOPLASTY    . TOTAL KNEE ARTHROPLASTY Left 02/18/2019   Procedure: TOTAL KNEE ARTHROPLASTY;  Surgeon: Carole Civil, MD;  Location: [facility];  Service: Orthopedics;  Laterality: Left;    There were no vitals filed for this visit.   Subjective Assessment - 07/19/20 1005    Subjective Patient states his knee has been feeling good. His home exercises are going well.    Pertinent History L TKA, patellectomy 05/03/20    Currently  in Pain? Yes    Pain Score 2     Pain Location Knee    Pain Orientation Anterior;Left    Pain Descriptors / Indicators Aching    Pain Type Surgical pain    Pain Onset More than a month ago                             [facility] Adult PT Treatment/Exercise - 07/19/20 0001      Knee/Hip Exercises: Standing   Heel Raises Both;20 reps    Heel Raises Limitations Toe raises 20x 5" holds    Knee Flexion Left;2 sets;10 reps    Knee Flexion Limitations 5 second holds 0-45    Forward Step Up Left;2 sets;10 reps;Hand Hold: 1;Step Height: 4"    Functional Squat 2 sets;10 reps    Functional Squat Limitations 10 minisquat with brace on and UE support    Rocker Board 4  minutes    Rocker Board Limitations 2x 2 minutes lateral    SLS with Vectors 5x 5"    Other Standing Knee Exercises lateral stepping with green band at knees 8x 15 feet bilateral                  PT Education - 07/19/20 1004    Education Details HEP, exercise mechanics    Person(s) Educated Patient    Methods Explanation;Demonstration    Comprehension Verbalized understanding;Returned demonstration            PT Short Term Goals - 07/02/20 1526      PT SHORT TERM GOAL #1   Title Patient will be independent with HEP in order to improve functional outcomes.    Time 3    Period Weeks    Status On-going    Target Date 07/21/20             PT Long Term Goals - 07/02/20 1526      PT LONG TERM GOAL #1   Title Patient will have L knee ROM from 0-60, as protcol states, without symptoms in order to demonstrate improving ROM for transfers.    Time 6    Period Weeks    Status On-going      PT LONG TERM GOAL #2   Title Patient will be able to maintain tandem stance >30 seconds on BLEs to improve stability and reduce risk for falls    Time 6    Period Weeks    Status On-going      PT LONG TERM GOAL #3   Title Patient will have equal to or > 4+/5 MMT throughout LLE to improve ability to perform functional mobility, stair ambulation and ADLs.    Time 6    Period Weeks    Status On-going      PT LONG TERM GOAL #4   Title Patient will be able to ambulate at least 400 feet during 2MWT with LRAD to demonstrate improved ability to perform functional mobility and associated tasks.    Time 6    Period Weeks    Status On-going                 Plan - 07/19/20 1004    Clinical Impression Statement Patient able to progress to step up exercise showing good strength and motor control within 0-45 degree range.  Patient requires cueing for squat mechanics with fair carry over and is able to complete without UE support but requires  intermittent cueing for mechanics and  proper weight shifting. Patient fatigues quickly with wall squat in quads but without c/o pain. Patient will continue to benefit from skilled physical therapy in order to reduce impairment and improve function.    Personal Factors and Comorbidities Comorbidity 2;Time since onset of injury/illness/exacerbation;Past/Current Experience;Fitness;Education    Comorbidities HTN, L TKA, L patellectomy on 05/03/20    Examination-Activity Limitations Locomotion Level;Transfers;Squat;Stairs;Stand;Lift    Examination-Participation Restrictions Cleaning;Occupation;Community Activity;Shop;Volunteer;Yard Work    Stability/Clinical Decision Making Stable/Uncomplicated    Rehab Potential Good    PT Frequency 2x / week   1-2x/ week   PT Duration 6 weeks    PT Treatment/Interventions ADLs/Self Care Home Management;Aquatic Therapy;Biofeedback;Cryotherapy;Electrical Stimulation;Iontophoresis 4mg /ml Dexamethasone;Moist Heat;Traction;Ultrasound;DME Instruction;Gait training;Stair training;Functional mobility training;Therapeutic activities;Therapeutic exercise;Balance training;Neuromuscular re-education;Patient/family education;Orthotic Fit/Training;Manual techniques;Passive range of motion;Dry needling;Energy conservation;Splinting;Taping;Spinal Manipulations;Joint Manipulations    PT Next Visit Plan Continue to improve ROM and manual as needed.  Protocol which states 0-30 x 2 weeks until 4/5, 0-45 x 2 weeks until 4/19, 0-60 x 2 weeks until 5/ 3; balance, hip strengthening    PT Home Exercise Plan 06/30/20 quad set at 0 degrees; 2/25; standing heel raises, hip abduction/extension, SAQ and SLR; 07/05/20 - sidelying hip add/abd, prone hip ext, prone TKE 4/11 wall sit    Consulted and Agree with Plan of Care Patient           Patient will benefit from skilled therapeutic intervention in order to improve the following deficits and impairments:  Abnormal gait,Decreased range of motion,Difficulty walking,Decreased  endurance,Increased muscle spasms,Pain,Decreased activity tolerance,Decreased balance,Impaired flexibility,Improper body mechanics,Decreased mobility,Decreased strength,Impaired sensation  Visit Diagnosis: Left knee pain, unspecified chronicity  Stiffness of left knee, not elsewhere classified  Muscle weakness (generalized)  Other abnormalities of gait and mobility     Problem List Patient Active Problem List   Diagnosis Date Noted  . Peri-prosthetic patellar fracture 04/28/2020  . S/P total knee replacement, left 02/18/2019 03/04/2019  . Osteoarthritis of left knee 02/18/2019  . Effusion of knee joint, left 06/03/2018  . S/P left knee arthroscopy 12/07/16 04/13/2017  . Family history of colon cancer 02/28/2017  . Hepatic cirrhosis ([place]) 02/28/2017  . Derangement of posterior horn of medial meniscus of left knee   . Primary osteoarthritis of left knee   . Chondromalacia of medial femoral condyle, left   . Hepatitis C 06/15/2016  . Lumbar herniated disc 10/06/2013  . Carpal tunnel syndrome 07/28/2013    10:41 AM, 07/19/20 Mearl Latin PT, DPT Physical Therapist at [place], [place], [address] Phone: [phone]   Fax:  [phone]  Name: DOMNIC VANTOL MRN: [id number] Date of Birth: Jun 04, 1970

## 2020-07-19 NOTE — Patient Instructions (Signed)
Access Code: [email or web address] URL: [email or web address] Date: 07/19/2020 Prepared by: [facility] Tanveer Brammer  Exercises Wall Quarter Squat - 1 x daily - 7 x weekly - 2 sets - 5 reps - 10 second hold

## 2020-07-21 ENCOUNTER — Encounter (HOSPITAL_COMMUNITY): Payer: Self-pay | Admitting: Physical Therapy

## 2020-07-21 ENCOUNTER — Other Ambulatory Visit: Payer: Self-pay

## 2020-07-21 ENCOUNTER — Ambulatory Visit (HOSPITAL_COMMUNITY): Payer: Medicaid Other | Admitting: Physical Therapy

## 2020-07-21 DIAGNOSIS — M25562 Pain in left knee: Secondary | ICD-10-CM | POA: Diagnosis not present

## 2020-07-21 DIAGNOSIS — M6281 Muscle weakness (generalized): Secondary | ICD-10-CM

## 2020-07-21 DIAGNOSIS — M25662 Stiffness of left knee, not elsewhere classified: Secondary | ICD-10-CM

## 2020-07-21 DIAGNOSIS — R2689 Other abnormalities of gait and mobility: Secondary | ICD-10-CM

## 2020-07-21 NOTE — Therapy (Signed)
[place], [place], [address] Phone: [phone]   Fax:  [phone]  Physical Therapy Treatment  Patient Details  Name: Jamie [name] MRN: [id number] Date of Birth: 25-Apr-1970 Referring Provider (PT): Frederik Pear MD   Encounter Date: 07/21/2020   PT End of Session - 07/21/20 1446    Visit Number 7    Number of Visits 12    Date for PT Re-Evaluation 08/11/20    Authorization Type UHC Medicaid (no auth for patients over 21, 27 visits per year)    Authorization - Visit Number 6    Authorization - Number of Visits 27    PT Start Time 3662    PT Stop Time 1525    PT Time Calculation (min) 38 min    Equipment Utilized During Treatment Other (comment)   hinge brace   Activity Tolerance Patient tolerated treatment well    Behavior During Therapy WFL for tasks assessed/performed           Past Medical History:  Diagnosis Date  . Anxiety   . Arthritis   . Chronic back pain   . Chronic knee pain   . GERD (gastroesophageal reflux disease)    occ  . HCV antibody positive   . Hypertension    "Dr Karie Kirks took me off meds"  diet control  . Lumbar radiculopathy   . Pre-diabetes     Past Surgical History:  Procedure Laterality Date  . BACK SURGERY    . CARPAL TUNNEL RELEASE Right 06/25/2013   Procedure: CARPAL TUNNEL RELEASE;  Surgeon: Carole Civil, MD;  [facility];  Service: Orthopedics;  Laterality: Right;  . CARPAL TUNNEL RELEASE Left 07/25/2013   Procedure: LEFT CARPAL TUNNEL RELEASE;  Surgeon: Carole Civil, MD;  Location: [facility];  Service: Orthopedics;  Laterality: Left;  . COLONOSCOPY WITH PROPOFOL N/A 04/05/2017   Procedure: COLONOSCOPY WITH PROPOFOL;  Surgeon: Daneil Dolin, MD;  [facility];  Service: Endoscopy;  Laterality: N/A;  12:30pm-pt notified to arrive at 9:15am for 10:45am procedure per KF  . ELBOW SURGERY Left   . FOOT SURGERY Right   . HERNIA REPAIR Right     inguinal- age 69  . KNEE ARTHROSCOPY WITH MEDIAL MENISECTOMY Left 12/07/2016   Procedure: KNEE ARTHROSCOPY WITH MEDIAL MENISECTOMY;  Surgeon: Carole Civil, MD;  Location: [facility];  Service: Orthopedics;  Laterality: Left;  . KNEE SURGERY    . left elbow    . LUMBAR LAMINECTOMY/DECOMPRESSION MICRODISCECTOMY Left 10/06/2013   Procedure: Left Lumbar Three-four microdiskectomy;  Surgeon: Ophelia Charter, MD;  Location: [facility];  Service: Neurosurgery;  Laterality: Left;  Left Lumbar Three-four microdiskectomy  . REPAIR OF RUPTURED PATELLA LIGAMENT Left 05/03/2020   Procedure: LEFT KNEE PATELLECTOMY;  Surgeon: Frederik Pear, MD;  [facility];  Service: Orthopedics;  Laterality: Left;  . right foot     forgein body removal  . right knee  orif right patella Keeling 1993  . SEPTOPLASTY    . TOTAL KNEE ARTHROPLASTY Left 02/18/2019   Procedure: TOTAL KNEE ARTHROPLASTY;  Surgeon: Carole Civil, MD;  Location: [facility];  Service: Orthopedics;  Laterality: Left;    There were no vitals filed for this visit.   Subjective Assessment - 07/21/20 1448    Subjective Patient states minimal knee pain. He felt good after last session. His home exercises are going well.    Pertinent History L TKA, patellectomy 05/03/20  Currently in Pain? Yes    Pain Score 2     Pain Location Knee    Pain Orientation Anterior;Left    Pain Descriptors / Indicators Aching    Pain Type Surgical pain    Pain Onset More than a month ago                             [facility] Adult PT Treatment/Exercise - 07/21/20 0001      Knee/Hip Exercises: Standing   Heel Raises Both;20 reps;5 seconds    Heel Raises Limitations Toe raises 20x 5" holds    Knee Flexion Left;2 sets;10 reps    Knee Flexion Limitations 5 second holds 0-45, 4#    Forward Step Up Left;10 reps;Hand Hold: 1;Step Height: 4";3 sets    Functional Squat 10 reps;3 sets    Functional Squat Limitations minisquat with brace on and UE  support    SLS with Vectors 5x 5"    Other Standing Knee Exercises weighted retro walking 4 plates 1 x 15      Knee/Hip Exercises: Supine   Straight Leg Raises Left;15 reps    Straight Leg Raises Limitations 5 sec hold                  PT Education - 07/21/20 1447    Education Details HEP, exercise mechanics    Person(s) Educated Patient    Methods Explanation;Demonstration    Comprehension Verbalized understanding;Returned demonstration            PT Short Term Goals - 07/02/20 1526      PT SHORT TERM GOAL #1   Title Patient will be independent with HEP in order to improve functional outcomes.    Time 3    Period Weeks    Status On-going    Target Date 07/21/20             PT Long Term Goals - 07/02/20 1526      PT LONG TERM GOAL #1   Title Patient will have L knee ROM from 0-60, as protcol states, without symptoms in order to demonstrate improving ROM for transfers.    Time 6    Period Weeks    Status On-going      PT LONG TERM GOAL #2   Title Patient will be able to maintain tandem stance >30 seconds on BLEs to improve stability and reduce risk for falls    Time 6    Period Weeks    Status On-going      PT LONG TERM GOAL #3   Title Patient will have equal to or > 4+/5 MMT throughout LLE to improve ability to perform functional mobility, stair ambulation and ADLs.    Time 6    Period Weeks    Status On-going      PT LONG TERM GOAL #4   Title Patient will be able to ambulate at least 400 feet during 2MWT with LRAD to demonstrate improved ability to perform functional mobility and associated tasks.    Time 6    Period Weeks    Status On-going                 Plan - 07/21/20 1447    Clinical Impression Statement Patient progressing well with strength and improving activity tolerance. Patient requires cueing with mini squats not to bounce at end range and for improving hip excursion vs anterior translation. Patient requires HHA  with balance  exercises and brace is readjusted to lock out per protocol as patient arrives with it unlocked. Patient showing extension lag initially with SLR but is able to maintain TKE following cueing for quad set. Patient will benefit from skilled physical therapy in order to improve function and reduce impairment.    Personal Factors and Comorbidities Comorbidity 2;Time since onset of injury/illness/exacerbation;Past/Current Experience;Fitness;Education    Comorbidities HTN, L TKA, L patellectomy on 05/03/20    Examination-Activity Limitations Locomotion Level;Transfers;Squat;Stairs;Stand;Lift    Examination-Participation Restrictions Cleaning;Occupation;Community Activity;Shop;Volunteer;Yard Work    Stability/Clinical Decision Making Stable/Uncomplicated    Rehab Potential Good    PT Frequency 2x / week   1-2x/ week   PT Duration 6 weeks    PT Treatment/Interventions ADLs/Self Care Home Management;Aquatic Therapy;Biofeedback;Cryotherapy;Electrical Stimulation;Iontophoresis 4mg /ml Dexamethasone;Moist Heat;Traction;Ultrasound;DME Instruction;Gait training;Stair training;Functional mobility training;Therapeutic activities;Therapeutic exercise;Balance training;Neuromuscular re-education;Patient/family education;Orthotic Fit/Training;Manual techniques;Passive range of motion;Dry needling;Energy conservation;Splinting;Taping;Spinal Manipulations;Joint Manipulations    PT Next Visit Plan Continue to improve ROM and manual as needed.  Protocol which states 0-30 x 2 weeks until 4/5, 0-45 x 2 weeks until 4/19, 0-60 x 2 weeks until 5/ 3; balance, hip strengthening    PT Home Exercise Plan 06/30/20 quad set at 0 degrees; 2/25; standing heel raises, hip abduction/extension, SAQ and SLR; 07/05/20 - sidelying hip add/abd, prone hip ext, prone TKE 4/11 wall sit    Consulted and Agree with Plan of Care Patient           Patient will benefit from skilled therapeutic intervention in order to improve the following deficits and  impairments:  Abnormal gait,Decreased range of motion,Difficulty walking,Decreased endurance,Increased muscle spasms,Pain,Decreased activity tolerance,Decreased balance,Impaired flexibility,Improper body mechanics,Decreased mobility,Decreased strength,Impaired sensation  Visit Diagnosis: Left knee pain, unspecified chronicity  Stiffness of left knee, not elsewhere classified  Muscle weakness (generalized)  Other abnormalities of gait and mobility     Problem List Patient Active Problem List   Diagnosis Date Noted  . Peri-prosthetic patellar fracture 04/28/2020  . S/P total knee replacement, left 02/18/2019 03/04/2019  . Osteoarthritis of left knee 02/18/2019  . Effusion of knee joint, left 06/03/2018  . S/P left knee arthroscopy 12/07/16 04/13/2017  . Family history of colon cancer 02/28/2017  . Hepatic cirrhosis ([place]) 02/28/2017  . Derangement of posterior horn of medial meniscus of left knee   . Primary osteoarthritis of left knee   . Chondromalacia of medial femoral condyle, left   . Hepatitis C 06/15/2016  . Lumbar herniated disc 10/06/2013  . Carpal tunnel syndrome 07/28/2013   3:24 PM, 07/21/20 Mearl Latin PT, DPT Physical Therapist at [place], [place], [address] Phone: [phone]   Fax:  [phone]  Name: JUDE NACLERIO MRN: [id number] Date of Birth: 01/05/1971

## 2020-07-23 ENCOUNTER — Encounter (HOSPITAL_COMMUNITY): Payer: Medicaid Other | Admitting: Physical Therapy

## 2020-07-26 ENCOUNTER — Encounter (HOSPITAL_COMMUNITY): Payer: Medicaid Other | Admitting: Physical Therapy

## 2020-07-28 ENCOUNTER — Encounter (HOSPITAL_COMMUNITY): Payer: Self-pay | Admitting: Physical Therapy

## 2020-07-28 ENCOUNTER — Other Ambulatory Visit: Payer: Self-pay

## 2020-07-28 ENCOUNTER — Ambulatory Visit (HOSPITAL_COMMUNITY): Payer: Medicaid Other | Admitting: Physical Therapy

## 2020-07-28 DIAGNOSIS — M6281 Muscle weakness (generalized): Secondary | ICD-10-CM

## 2020-07-28 DIAGNOSIS — M25562 Pain in left knee: Secondary | ICD-10-CM

## 2020-07-28 DIAGNOSIS — M25662 Stiffness of left knee, not elsewhere classified: Secondary | ICD-10-CM

## 2020-07-28 DIAGNOSIS — R2689 Other abnormalities of gait and mobility: Secondary | ICD-10-CM

## 2020-07-28 NOTE — Therapy (Signed)
[place], [place], [address] Phone: [phone]   Fax:  [phone]  Patient Details  Name: Jamie [name] MRN: [id number] Date of Birth: [birth year] Referring Provider:  Lemmie Evens, MD  Encounter Date: 07/28/2020  Patient arrives for visit not feeling well enough to participate in physical therapy today. Patient wishes to leave and will return for appointment later this week. He is told to call and cancel if he continues to not feel well.   9:25 AM, 07/28/20 Mearl Latin PT, DPT Physical Therapist at [place], [place], [address] Phone: [phone]   Fax:  [phone]

## 2020-07-30 ENCOUNTER — Encounter (HOSPITAL_COMMUNITY): Payer: Medicaid Other

## 2020-07-30 ENCOUNTER — Ambulatory Visit (HOSPITAL_COMMUNITY): Payer: Medicaid Other

## 2020-08-02 ENCOUNTER — Encounter (HOSPITAL_COMMUNITY): Payer: Medicaid Other | Admitting: Physical Therapy

## 2020-08-04 ENCOUNTER — Ambulatory Visit (HOSPITAL_COMMUNITY): Payer: Medicaid Other | Admitting: Physical Therapy

## 2020-08-04 ENCOUNTER — Telehealth (HOSPITAL_COMMUNITY): Payer: Self-pay | Admitting: Physical Therapy

## 2020-08-04 NOTE — Telephone Encounter (Signed)
Patient no show, left voicemail for patient making him aware of missed appointment and reminded him of next appointment.   9:42 AM, 08/04/20 Mearl Latin PT, DPT Physical Therapist at [facility]

## 2020-08-06 ENCOUNTER — Encounter (HOSPITAL_COMMUNITY): Payer: Medicaid Other

## 2020-08-09 ENCOUNTER — Ambulatory Visit (HOSPITAL_COMMUNITY): Payer: Medicaid Other | Attending: Orthopedic Surgery | Admitting: Physical Therapy

## 2020-08-09 ENCOUNTER — Other Ambulatory Visit: Payer: Self-pay

## 2020-08-09 ENCOUNTER — Encounter (HOSPITAL_COMMUNITY): Payer: Self-pay | Admitting: Physical Therapy

## 2020-08-09 DIAGNOSIS — M6281 Muscle weakness (generalized): Secondary | ICD-10-CM | POA: Diagnosis present

## 2020-08-09 DIAGNOSIS — M25562 Pain in left knee: Secondary | ICD-10-CM | POA: Diagnosis present

## 2020-08-09 DIAGNOSIS — R2689 Other abnormalities of gait and mobility: Secondary | ICD-10-CM | POA: Insufficient documentation

## 2020-08-09 DIAGNOSIS — M25662 Stiffness of left knee, not elsewhere classified: Secondary | ICD-10-CM | POA: Diagnosis present

## 2020-08-09 NOTE — Therapy (Addendum)
[facility] [address] [place], [place], [address] Phone: [phone]   Fax:  [phone]  Physical Therapy Treatment/Discharge Summary  Patient Details  Name: Jamie [name] MRN: [id number] Date of Birth: [birth year] Referring Provider (PT): Frederik Pear MD   Encounter Date: 08/09/2020   PHYSICAL THERAPY DISCHARGE SUMMARY  Visits from Start of Care: 8  Current functional level related to goals / functional outcomes: See below   Remaining deficits: See below   Education / Equipment: See below  Plan: Patient agrees to discharge.  Patient goals were met. Patient is being discharged due to meeting the stated rehab goals.  ?????        PT End of Session - 08/09/20 1004    Visit Number 8    Number of Visits 12    Date for PT Re-Evaluation 08/11/20    Authorization Type [employer] Medicaid (no auth for patients over 21, 27 visits per year)    Authorization - Visit Number 7    Authorization - Number of Visits 27    PT Start Time 1004    PT Stop Time 1032    PT Time Calculation (min) 28 min    Equipment Utilized During Treatment --    Activity Tolerance Patient tolerated treatment well    Behavior During Therapy WFL for tasks assessed/performed           Past Medical History:  Diagnosis Date  . Anxiety   . Arthritis   . Chronic back pain   . Chronic knee pain   . GERD (gastroesophageal reflux disease)    occ  . HCV antibody positive   . Hypertension    "Dr Karie Kirks took me off meds"  diet control  . Lumbar radiculopathy   . Pre-diabetes     Past Surgical History:  Procedure Laterality Date  . BACK SURGERY    . CARPAL TUNNEL RELEASE Right 06/25/2013   Procedure: CARPAL TUNNEL RELEASE;  Surgeon: Carole Civil, MD;  Location: [facility];  Service: Orthopedics;  Laterality: Right;  . CARPAL TUNNEL RELEASE Left 07/25/2013   Procedure: LEFT CARPAL TUNNEL RELEASE;  Surgeon: Carole Civil, MD;  Location: [facility];  Service:  Orthopedics;  Laterality: Left;  . COLONOSCOPY WITH PROPOFOL N/A 04/05/2017   Procedure: COLONOSCOPY WITH PROPOFOL;  Surgeon: Daneil Dolin, MD;  [facility];  Service: Endoscopy;  Laterality: N/A;  12:30pm-pt notified to arrive at 9:15am for 10:45am procedure per KF  . ELBOW SURGERY Left   . FOOT SURGERY Right   . HERNIA REPAIR Right    inguinal- age 77  . KNEE ARTHROSCOPY WITH MEDIAL MENISECTOMY Left 12/07/2016   Procedure: KNEE ARTHROSCOPY WITH MEDIAL MENISECTOMY;  Surgeon: Carole Civil, MD;  Location: [facility];  Service: Orthopedics;  Laterality: Left;  . KNEE SURGERY    . left elbow    . LUMBAR LAMINECTOMY/DECOMPRESSION MICRODISCECTOMY Left 10/06/2013   Procedure: Left Lumbar Three-four microdiskectomy;  Surgeon: Ophelia Charter, MD;  Location: [facility];  Service: Neurosurgery;  Laterality: Left;  Left Lumbar Three-four microdiskectomy  . REPAIR OF RUPTURED PATELLA LIGAMENT Left 05/03/2020   Procedure: LEFT KNEE PATELLECTOMY;  Surgeon: Frederik Pear, MD;  Location: [facility];  Service: Orthopedics;  Laterality: Left;  . right foot     forgein body removal  . right knee  orif right patella [place] 1993  . SEPTOPLASTY    . TOTAL KNEE ARTHROPLASTY Left 02/18/2019   Procedure: TOTAL KNEE ARTHROPLASTY;  Surgeon: Carole Civil, MD;  Location: [facility];  Service: Orthopedics;  Laterality: Left;    There were no vitals filed for this visit.   Subjective Assessment - 08/09/20 1005    Subjective Patient states he had some family trouble so he was out of town. He saw MD who stated he didnt need to wear brace anymore and no more restrictions. Patient states 80-85% improvment with PT intervention. He feel limited with bending, pain. His strength feels really good. He continues to do his home exercises.    Pertinent History L TKA, patellectomy 05/03/20    Currently in Pain? Yes    Pain Score 4     Pain Location Knee    Pain Orientation Anterior;Left    Pain Descriptors /  Indicators Aching    Pain Type Surgical pain    Pain Onset More than a month ago              [facility] PT Assessment - 08/09/20 0001      Assessment   Medical Diagnosis s/p L patellectomy    Referring Provider (PT) Frederik Pear MD    Onset Date/Surgical Date 05/03/20      Restrictions   Weight Bearing Restrictions No      Balance Screen   Has the patient fallen in the past 6 months No    Has the patient had a decrease in activity level because of a fear of falling?  No    Is the patient reluctant to leave their home because of a fear of falling?  No      Prior Function   Level of Independence Independent    Vocation Unemployed      Cognition   Overall Cognitive Status Within Functional Limits for tasks assessed      AROM   AROM Assessment Site Knee    Right/Left Knee Left    Left Knee Extension 1    Left Knee Flexion 112   improves to 120     Strength   Right Hip Flexion 5/5    Left Hip Flexion 5/5    Right Knee Flexion 5/5    Right Knee Extension 5/5    Left Knee Flexion 4+/5   pain in anterior knee   Left Knee Extension 4+/5   pain in anterior knee   Right Ankle Dorsiflexion 5/5    Left Ankle Dorsiflexion 5/5      Ambulation/Gait   Ambulation/Gait Yes    Ambulation/Gait Assistance 7: Independent    Ambulation Distance (Feet) 550 Feet    Assistive device None    Gait Pattern Within Functional Limits    Ambulation Surface Level;Indoor    Gait Comments 2 MWT      Balance   Balance Assessed --   tandem balance >30 seconds bilateral                        [facility] Adult PT Treatment/Exercise - 08/09/20 0001      Knee/Hip Exercises: Stretches   Quad Stretch 5 reps;30 seconds    Quad Stretch Limitations prone quad stretch                  PT Education - 08/09/20 1005    Education Details HEP, exercise mechanics    Person(s) Educated Patient    Methods Explanation;Demonstration    Comprehension Verbalized understanding;Returned  demonstration            PT Short Term Goals -  08/09/20 1010      PT SHORT TERM GOAL #1   Title Patient will be independent with HEP in order to improve functional outcomes.    Time 3    Period Weeks    Status Achieved    Target Date 07/21/20             PT Long Term Goals - 08/09/20 1010      PT LONG TERM GOAL #1   Title Patient will have L knee ROM from 0-60, as protcol states, without symptoms in order to demonstrate improving ROM for transfers.    Time 6    Period Weeks    Status Achieved      PT LONG TERM GOAL #2   Title Patient will be able to maintain tandem stance >30 seconds on BLEs to improve stability and reduce risk for falls    Time 6    Period Weeks    Status Achieved      PT LONG TERM GOAL #3   Title Patient will have equal to or > 4+/5 MMT throughout LLE to improve ability to perform functional mobility, stair ambulation and ADLs.    Time 6    Period Weeks    Status Achieved      PT LONG TERM GOAL #4   Title Patient will be able to ambulate at least 400 feet during 2MWT with LRAD to demonstrate improved ability to perform functional mobility and associated tasks.    Time 6    Period Weeks    Status Achieved                 Plan - 08/09/20 1005    Clinical Impression Statement Patient has met all short and long term goals with ability to complete HEP and improvement in symptoms, function, strength, ROM, gait and activity tolerance. Patient remains limited by c/o stiffness and pain. Patient showing 112 degrees of flexion at beginning of session which improves to 120 following prone quad stretch. Patient also stating improvement in pain following. Patient has done very well with therapy and feels ready to transition to HEP. Patient educated on returning to physical therapy if needed. Patient discharged from physical therapy at this time.    Personal Factors and Comorbidities Comorbidity 2;Time since onset of  injury/illness/exacerbation;Past/Current Experience;Fitness;Education    Comorbidities HTN, L TKA, L patellectomy on 05/03/20    Examination-Activity Limitations Locomotion Level;Transfers;Squat;Stairs;Stand;Lift    Examination-Participation Restrictions Cleaning;Occupation;Community Activity;Shop;Volunteer;Yard Work    Stability/Clinical Decision Making Stable/Uncomplicated    Rehab Potential Good    PT Frequency --    PT Duration --    PT Treatment/Interventions ADLs/Self Care Home Management;Aquatic Therapy;Biofeedback;Cryotherapy;Electrical Stimulation;Iontophoresis 57m/ml Dexamethasone;Moist Heat;Traction;Ultrasound;DME Instruction;Gait training;Stair training;Functional mobility training;Therapeutic activities;Therapeutic exercise;Balance training;Neuromuscular re-education;Patient/family education;Orthotic Fit/Training;Manual techniques;Passive range of motion;Dry needling;Energy conservation;Splinting;Taping;Spinal Manipulations;Joint Manipulations    PT Next Visit Plan n/a    PT Home Exercise Plan 06/30/20 quad set at 0 degrees; 2/25; standing heel raises, hip abduction/extension, SAQ and SLR; 07/05/20 - sidelying hip add/abd, prone hip ext, prone TKE 4/11 wall sit 5/2 prone quad stretch    Consulted and Agree with Plan of Care Patient           Patient will benefit from skilled therapeutic intervention in order to improve the following deficits and impairments:  Abnormal gait,Decreased range of motion,Difficulty walking,Decreased endurance,Increased muscle spasms,Pain,Decreased activity tolerance,Decreased balance,Impaired flexibility,Improper body mechanics,Decreased mobility,Decreased strength,Impaired sensation  Visit Diagnosis: Left knee pain, unspecified chronicity  Stiffness of left knee, not elsewhere classified  Muscle weakness (generalized)  Other abnormalities of gait and mobility     Problem List Patient Active Problem List   Diagnosis Date Noted  . Peri-prosthetic  patellar fracture 04/28/2020  . S/P total knee replacement, left 02/18/2019 03/04/2019  . Osteoarthritis of left knee 02/18/2019  . Effusion of knee joint, left 06/03/2018  . S/P left knee arthroscopy 12/07/16 04/13/2017  . Family history of colon cancer 02/28/2017  . Hepatic cirrhosis ([place]) 02/28/2017  . Derangement of posterior horn of medial meniscus of left knee   . Primary osteoarthritis of left knee   . Chondromalacia of medial femoral condyle, left   . Hepatitis C 06/15/2016  . Lumbar herniated disc 10/06/2013  . Carpal tunnel syndrome 07/28/2013   10:39 AM, 08/09/20 Mearl Latin PT, DPT Physical Therapist at [facility] [place], [place], [address] Phone: [phone]   Fax:  [phone]  Name: Jamie [name] MRN: [id number] Date of Birth: [birth year]

## 2020-08-09 NOTE — Patient Instructions (Signed)
Access Code: [email or web address] URL: [email or web address] Date: 08/09/2020 Prepared by: [facility] Zacharia Sowles  Exercises Prone Quadriceps Stretch with Strap - 2 x daily - 7 x weekly - 5 reps - 20-30 second hold

## 2020-08-11 ENCOUNTER — Ambulatory Visit (HOSPITAL_COMMUNITY): Payer: Medicaid Other | Admitting: Physical Therapy

## 2020-09-27 IMAGING — DX DG KNEE 1-2V PORT*L*
2 series · 2 of 2 positions shown · non-contrast
Comparison: Left knee radiograph dated 06/18/2018

CLINICAL DATA: 48-year-old male status post left knee arthroplasty.

EXAM:
PORTABLE LEFT KNEE - 1-2 VIEW

[knee ap]
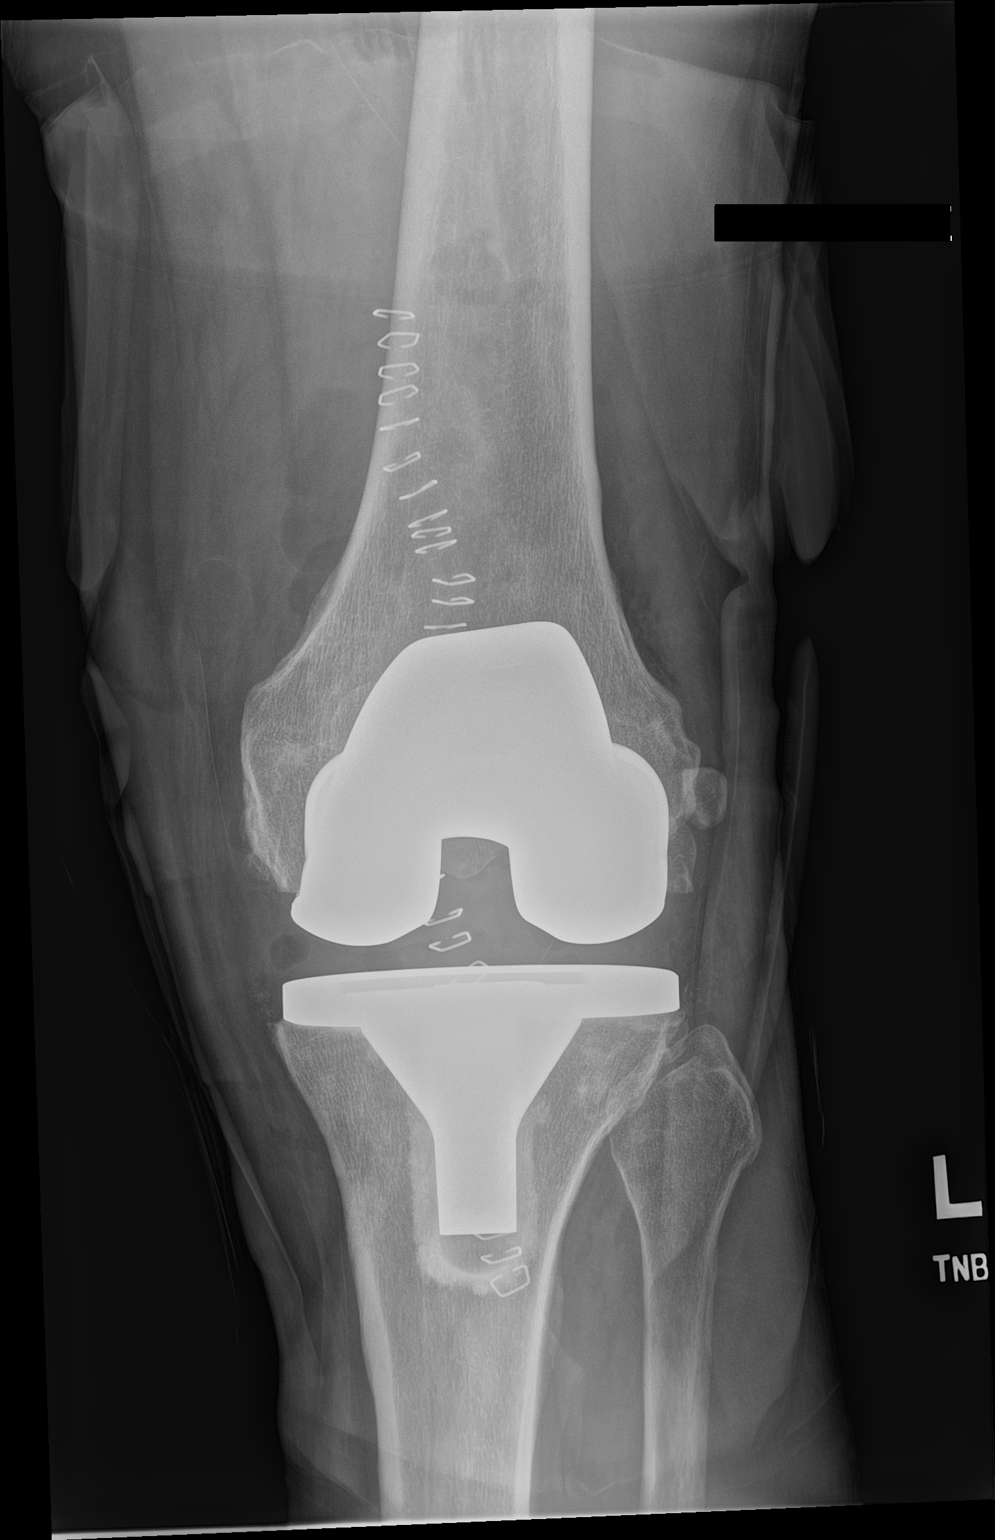

[knee lat]
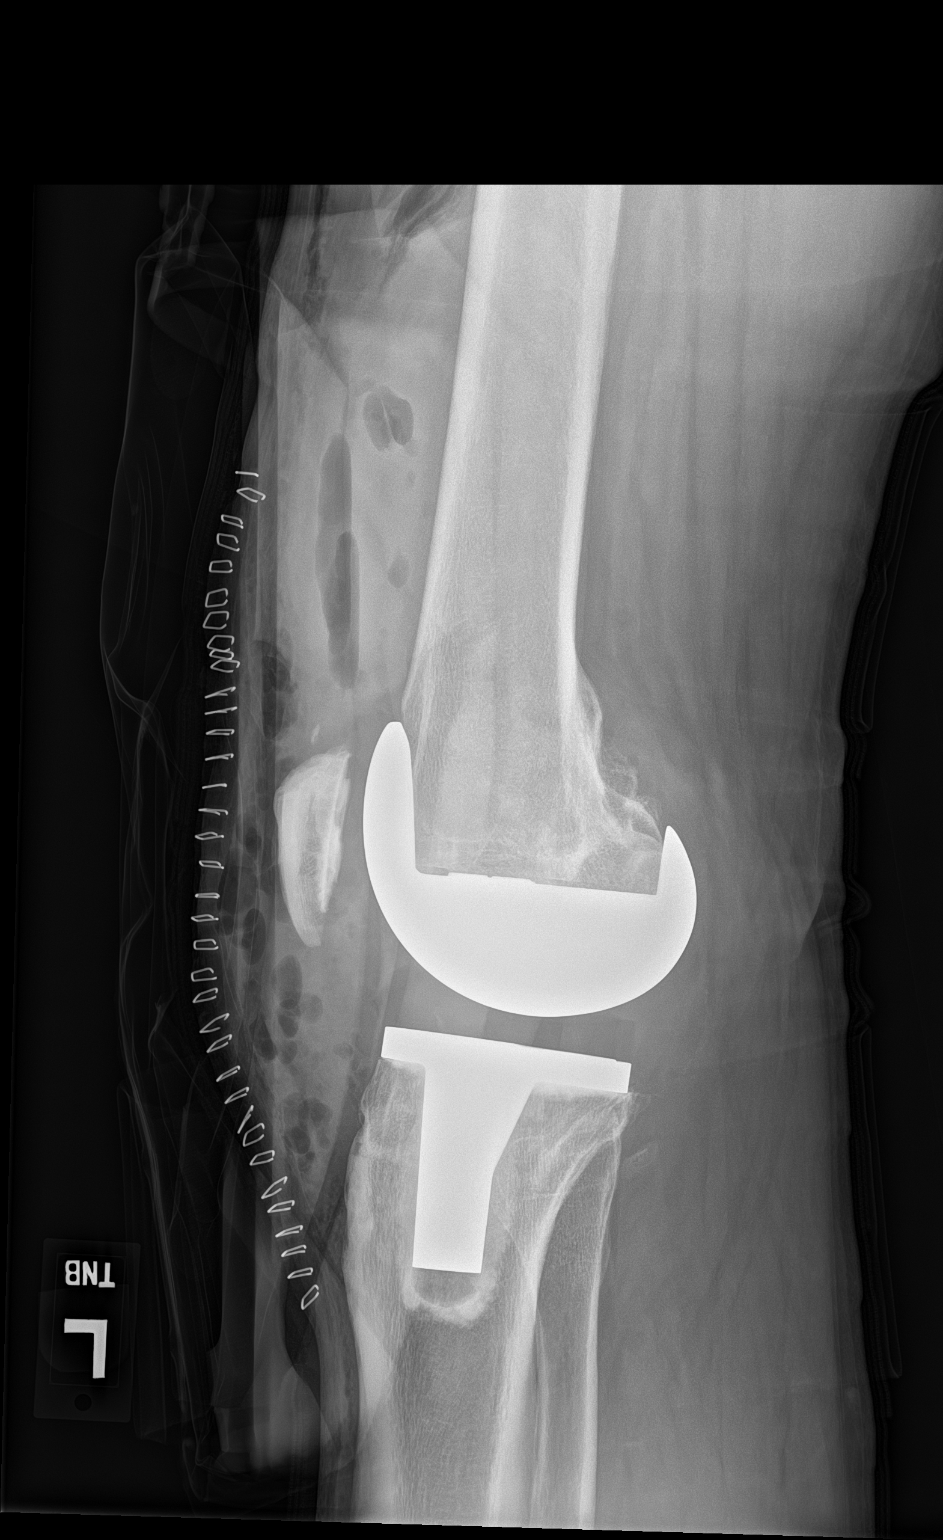

[2 of 2 positions shown; findings below may reference images not displayed]

FINDINGS: There is a total left knee arthroplasty. The arthroplasty components
appear intact and in anatomic alignment. There is no acute fracture
or dislocation. Postsurgical changes with pockets of air in the soft
tissues and suprapatellar region as well as suprapatellar effusion
noted. Cutaneous staples noted anterior to the knee.
IMPRESSION: Status post total left knee arthroplasty. No complication.

## 2020-10-13 ENCOUNTER — Other Ambulatory Visit: Payer: Self-pay | Admitting: Orthopedic Surgery

## 2020-10-13 DIAGNOSIS — R200 Anesthesia of skin: Secondary | ICD-10-CM

## 2021-03-26 ENCOUNTER — Other Ambulatory Visit: Payer: Self-pay | Admitting: Orthopedic Surgery

## 2021-03-26 DIAGNOSIS — R200 Anesthesia of skin: Secondary | ICD-10-CM

## 2021-06-12 ENCOUNTER — Emergency Department (HOSPITAL_BASED_OUTPATIENT_CLINIC_OR_DEPARTMENT_OTHER): Payer: Medicaid Other | Admitting: Anesthesiology

## 2021-06-12 ENCOUNTER — Other Ambulatory Visit: Payer: Self-pay

## 2021-06-12 ENCOUNTER — Emergency Department (HOSPITAL_COMMUNITY): Payer: Medicaid Other

## 2021-06-12 ENCOUNTER — Encounter (HOSPITAL_COMMUNITY): Payer: Self-pay

## 2021-06-12 ENCOUNTER — Encounter (HOSPITAL_COMMUNITY)
Admission: EM | Disposition: A | Discharge status: Home / Self Care | Payer: Self-pay | Source: Home / Self Care | Attending: Emergency Medicine

## 2021-06-12 ENCOUNTER — Emergency Department (HOSPITAL_COMMUNITY): Payer: Medicaid Other | Admitting: Anesthesiology

## 2021-06-12 ENCOUNTER — Ambulatory Visit (HOSPITAL_COMMUNITY)
Admission: EM | Admit: 2021-06-12 | Discharge: 2021-06-12 | Disposition: A | Discharge status: Home / Self Care | Payer: Medicaid Other | Attending: Emergency Medicine | Admitting: Emergency Medicine

## 2021-06-12 DIAGNOSIS — S6992XA Unspecified injury of left wrist, hand and finger(s), initial encounter: Secondary | ICD-10-CM

## 2021-06-12 DIAGNOSIS — X58XXXA Exposure to other specified factors, initial encounter: Secondary | ICD-10-CM | POA: Diagnosis not present

## 2021-06-12 DIAGNOSIS — S62625A Displaced fracture of medial phalanx of left ring finger, initial encounter for closed fracture: Secondary | ICD-10-CM | POA: Diagnosis not present

## 2021-06-12 DIAGNOSIS — S62625B Displaced fracture of medial phalanx of left ring finger, initial encounter for open fracture: Secondary | ICD-10-CM | POA: Diagnosis not present

## 2021-06-12 DIAGNOSIS — F17210 Nicotine dependence, cigarettes, uncomplicated: Secondary | ICD-10-CM | POA: Insufficient documentation

## 2021-06-12 DIAGNOSIS — W270XXA Contact with workbench tool, initial encounter: Secondary | ICD-10-CM | POA: Insufficient documentation

## 2021-06-12 DIAGNOSIS — S68617A Complete traumatic transphalangeal amputation of left little finger, initial encounter: Secondary | ICD-10-CM | POA: Diagnosis not present

## 2021-06-12 DIAGNOSIS — S61512A Laceration without foreign body of left wrist, initial encounter: Secondary | ICD-10-CM

## 2021-06-12 DIAGNOSIS — S61412A Laceration without foreign body of left hand, initial encounter: Secondary | ICD-10-CM | POA: Diagnosis not present

## 2021-06-12 DIAGNOSIS — Z6831 Body mass index (BMI) 31.0-31.9, adult: Secondary | ICD-10-CM | POA: Diagnosis not present

## 2021-06-12 DIAGNOSIS — K219 Gastro-esophageal reflux disease without esophagitis: Secondary | ICD-10-CM | POA: Diagnosis not present

## 2021-06-12 DIAGNOSIS — S62637A Displaced fracture of distal phalanx of left little finger, initial encounter for closed fracture: Secondary | ICD-10-CM | POA: Insufficient documentation

## 2021-06-12 DIAGNOSIS — I10 Essential (primary) hypertension: Secondary | ICD-10-CM | POA: Insufficient documentation

## 2021-06-12 DIAGNOSIS — E669 Obesity, unspecified: Secondary | ICD-10-CM | POA: Insufficient documentation

## 2021-06-12 DIAGNOSIS — S68127A Partial traumatic metacarpophalangeal amputation of left little finger, initial encounter: Secondary | ICD-10-CM | POA: Diagnosis not present

## 2021-06-12 HISTORY — PX: OPEN REDUCTION INTERNAL FIXATION (ORIF) HAND: SHX5991

## 2021-06-12 HISTORY — PX: I & D EXTREMITY: SHX5045

## 2021-06-12 LAB — POCT I-STAT, CHEM 8
BUN: 11 mg/dL (ref 6–20)
Calcium, Ion: 1.1 mmol/L — ABNORMAL LOW (ref 1.15–1.40)
Chloride: 97 mmol/L — ABNORMAL LOW (ref 98–111)
Creatinine, Ser: 0.9 mg/dL (ref 0.61–1.24)
Glucose, Bld: 91 mg/dL (ref 70–99)
HCT: 40 % (ref 39.0–52.0)
Hemoglobin: 13.6 g/dL (ref 13.0–17.0)
Potassium: 3.4 mmol/L — ABNORMAL LOW (ref 3.5–5.1)
Sodium: 138 mmol/L (ref 135–145)
TCO2: 31 mmol/L (ref 22–32)

## 2021-06-12 LAB — SURGICAL PCR SCREEN
MRSA, PCR: NEGATIVE
Staphylococcus aureus: NEGATIVE

## 2021-06-12 SURGERY — IRRIGATION AND DEBRIDEMENT EXTREMITY
Anesthesia: General | Site: Hand | Laterality: Left

## 2021-06-12 MED ORDER — OXYCODONE-ACETAMINOPHEN 10-325 MG PO TABS: 1.0000 | ORAL_TABLET | Freq: Four times a day (QID) | ORAL | 0 refills | Status: AC | PRN

## 2021-06-12 MED ORDER — CHLORHEXIDINE GLUCONATE 4 % EX LIQD: 60.0000 mL | Freq: Once | CUTANEOUS | Status: DC

## 2021-06-12 MED ORDER — MIDAZOLAM HCL 5 MG/5ML IJ SOLN
INTRAMUSCULAR | Status: DC | PRN
  Administered 2021-06-12: 1 mg via INTRAVENOUS

## 2021-06-12 MED ORDER — CEFAZOLIN SODIUM-DEXTROSE 2-4 GM/100ML-% IV SOLN
2.0000 g | INTRAVENOUS | Status: AC
  Administered 2021-06-12: 2 g via INTRAVENOUS
  Filled 2021-06-12: qty 100

## 2021-06-12 MED ORDER — LIDOCAINE 2% (20 MG/ML) 5 ML SYRINGE
INTRAMUSCULAR | Status: DC | PRN
  Administered 2021-06-12: 60 mg via INTRAVENOUS

## 2021-06-12 MED ORDER — POVIDONE-IODINE 10 % EX SWAB
2.0000 "application " | Freq: Once | CUTANEOUS | Status: AC
  Administered 2021-06-12: 2 via TOPICAL

## 2021-06-12 MED ORDER — ORAL CARE MOUTH RINSE: 15.0000 mL | Freq: Once | OROMUCOSAL | Status: AC

## 2021-06-12 MED ORDER — CEPHALEXIN 500 MG PO CAPS
500.0000 mg | ORAL_CAPSULE | Freq: Four times a day (QID) | ORAL | 0 refills | Status: AC
Start: 2021-06-12 — End: 2021-06-22

## 2021-06-12 MED ORDER — FENTANYL CITRATE (PF) 250 MCG/5ML IJ SOLN
INTRAMUSCULAR | Status: AC
  Filled 2021-06-12: qty 5

## 2021-06-12 MED ORDER — SUCCINYLCHOLINE CHLORIDE 200 MG/10ML IV SOSY
PREFILLED_SYRINGE | INTRAVENOUS | Status: DC | PRN
  Administered 2021-06-12: 120 mg via INTRAVENOUS

## 2021-06-12 MED ORDER — FENTANYL CITRATE PF 50 MCG/ML IJ SOSY: 50.0000 ug | PREFILLED_SYRINGE | Freq: Once | INTRAMUSCULAR | Status: DC

## 2021-06-12 MED ORDER — DEXAMETHASONE SODIUM PHOSPHATE 10 MG/ML IJ SOLN
INTRAMUSCULAR | Status: AC
  Filled 2021-06-12: qty 1

## 2021-06-12 MED ORDER — FENTANYL CITRATE (PF) 250 MCG/5ML IJ SOLN
INTRAMUSCULAR | Status: DC | PRN
  Administered 2021-06-12: 50 ug via INTRAVENOUS

## 2021-06-12 MED ORDER — BUPIVACAINE HCL (PF) 0.25 % IJ SOLN
INTRAMUSCULAR | Status: AC
  Filled 2021-06-12: qty 30

## 2021-06-12 MED ORDER — LACTATED RINGERS IV SOLN: INTRAVENOUS | Status: DC

## 2021-06-12 MED ORDER — LORAZEPAM 1 MG PO TABS
0.5000 mg | ORAL_TABLET | Freq: Once | ORAL | Status: AC
  Administered 2021-06-12: 0.5 mg via ORAL
  Filled 2021-06-12: qty 1

## 2021-06-12 MED ORDER — SUGAMMADEX SODIUM 200 MG/2ML IV SOLN
INTRAVENOUS | Status: DC | PRN
  Administered 2021-06-12: 400 mg via INTRAVENOUS

## 2021-06-12 MED ORDER — SUCCINYLCHOLINE CHLORIDE 200 MG/10ML IV SOSY
PREFILLED_SYRINGE | INTRAVENOUS | Status: AC
  Filled 2021-06-12: qty 10

## 2021-06-12 MED ORDER — ACETAMINOPHEN 500 MG PO TABS
1000.0000 mg | ORAL_TABLET | Freq: Once | ORAL | Status: AC
  Administered 2021-06-12: 1000 mg via ORAL
  Filled 2021-06-12: qty 2

## 2021-06-12 MED ORDER — LIDOCAINE HCL (PF) 1 % IJ SOLN
5.0000 mL | Freq: Once | INTRAMUSCULAR | Status: AC
  Administered 2021-06-12: 5 mL via INTRADERMAL
  Filled 2021-06-12: qty 5

## 2021-06-12 MED ORDER — ONDANSETRON HCL 4 MG/2ML IJ SOLN
INTRAMUSCULAR | Status: AC
  Filled 2021-06-12: qty 2

## 2021-06-12 MED ORDER — CEFAZOLIN SODIUM-DEXTROSE 1-4 GM/50ML-% IV SOLN: 1.0000 g | Freq: Once | INTRAVENOUS | Status: DC

## 2021-06-12 MED ORDER — 0.9 % SODIUM CHLORIDE (POUR BTL) OPTIME
TOPICAL | Status: DC | PRN
  Administered 2021-06-12: 1000 mL

## 2021-06-12 MED ORDER — PROPOFOL 10 MG/ML IV BOLUS
INTRAVENOUS | Status: DC | PRN
  Administered 2021-06-12: 160 mg via INTRAVENOUS

## 2021-06-12 MED ORDER — DEXAMETHASONE SODIUM PHOSPHATE 10 MG/ML IJ SOLN
INTRAMUSCULAR | Status: DC | PRN
Start: 2021-06-12 — End: 2021-06-12
  Administered 2021-06-12: 5 mg via INTRAVENOUS

## 2021-06-12 MED ORDER — LIDOCAINE 2% (20 MG/ML) 5 ML SYRINGE
INTRAMUSCULAR | Status: AC
  Filled 2021-06-12: qty 5

## 2021-06-12 MED ORDER — ROCURONIUM BROMIDE 10 MG/ML (PF) SYRINGE
PREFILLED_SYRINGE | INTRAVENOUS | Status: AC
  Filled 2021-06-12: qty 10

## 2021-06-12 MED ORDER — BUPIVACAINE HCL (PF) 0.25 % IJ SOLN
INTRAMUSCULAR | Status: DC | PRN
  Administered 2021-06-12: 7 mL

## 2021-06-12 MED ORDER — ROCURONIUM BROMIDE 10 MG/ML (PF) SYRINGE
PREFILLED_SYRINGE | INTRAVENOUS | Status: DC | PRN
  Administered 2021-06-12: 40 mg via INTRAVENOUS

## 2021-06-12 MED ORDER — CELECOXIB 200 MG PO CAPS
200.0000 mg | ORAL_CAPSULE | Freq: Once | ORAL | Status: AC
  Administered 2021-06-12: 200 mg via ORAL
  Filled 2021-06-12: qty 1

## 2021-06-12 MED ORDER — GLYCOPYRROLATE PF 0.2 MG/ML IJ SOSY
PREFILLED_SYRINGE | INTRAMUSCULAR | Status: DC | PRN
  Administered 2021-06-12: .2 mg via INTRAVENOUS

## 2021-06-12 MED ORDER — GABAPENTIN 300 MG PO CAPS
300.0000 mg | ORAL_CAPSULE | Freq: Once | ORAL | Status: AC
  Administered 2021-06-12: 300 mg via ORAL
  Filled 2021-06-12: qty 1

## 2021-06-12 MED ORDER — CHLORHEXIDINE GLUCONATE 0.12 % MT SOLN
15.0000 mL | Freq: Once | OROMUCOSAL | Status: AC
  Administered 2021-06-12: 15 mL via OROMUCOSAL
  Filled 2021-06-12: qty 15

## 2021-06-12 MED ORDER — MIDAZOLAM HCL 2 MG/2ML IJ SOLN
INTRAMUSCULAR | Status: AC
  Filled 2021-06-12: qty 2

## 2021-06-12 MED ORDER — ONDANSETRON HCL 4 MG/2ML IJ SOLN
INTRAMUSCULAR | Status: DC | PRN
  Administered 2021-06-12: 4 mg via INTRAVENOUS

## 2021-06-12 SURGICAL SUPPLY — 64 items
BAG COUNTER SPONGE SURGICOUNT (BAG) ×2 IMPLANT
BNDG COHESIVE 1X5 TAN STRL LF (GAUZE/BANDAGES/DRESSINGS) IMPLANT
BNDG CONFORM 2 STRL LF (GAUZE/BANDAGES/DRESSINGS) IMPLANT
BNDG CONFORM 3 STRL LF (GAUZE/BANDAGES/DRESSINGS) ×1 IMPLANT
BNDG ELASTIC 2X5.8 VLCR STR LF (GAUZE/BANDAGES/DRESSINGS) ×1 IMPLANT
BNDG ELASTIC 3X5.8 VLCR STR LF (GAUZE/BANDAGES/DRESSINGS) ×2 IMPLANT
BNDG ELASTIC 4X5.8 VLCR STR LF (GAUZE/BANDAGES/DRESSINGS) ×2 IMPLANT
BNDG ESMARK 4X9 LF (GAUZE/BANDAGES/DRESSINGS) ×2 IMPLANT
BNDG GAUZE ELAST 4 BULKY (GAUZE/BANDAGES/DRESSINGS) ×2 IMPLANT
CORD BIPOLAR FORCEPS 12FT (ELECTRODE) ×2 IMPLANT
COVER SURGICAL LIGHT HANDLE (MISCELLANEOUS) ×2 IMPLANT
CUFF TOURN SGL QUICK 18X4 (TOURNIQUET CUFF) ×2 IMPLANT
CUFF TOURN SGL QUICK 24 (TOURNIQUET CUFF)
CUFF TRNQT CYL 24X4X16.5-23 (TOURNIQUET CUFF) IMPLANT
DRAIN PENROSE 1/4X12 LTX STRL (WOUND CARE) IMPLANT
DRAPE SURG 17X23 STRL (DRAPES) ×2 IMPLANT
DRSG ADAPTIC 3X8 NADH LF (GAUZE/BANDAGES/DRESSINGS) ×2 IMPLANT
DRSG EMULSION OIL 3X3 NADH (GAUZE/BANDAGES/DRESSINGS) ×1 IMPLANT
ELECT REM PT RETURN 9FT ADLT (ELECTROSURGICAL)
ELECTRODE REM PT RTRN 9FT ADLT (ELECTROSURGICAL) IMPLANT
GAUZE SPONGE 4X4 12PLY STRL (GAUZE/BANDAGES/DRESSINGS) ×2 IMPLANT
GAUZE XEROFORM 1X8 LF (GAUZE/BANDAGES/DRESSINGS) ×2 IMPLANT
GAUZE XEROFORM 5X9 LF (GAUZE/BANDAGES/DRESSINGS) IMPLANT
GLOVE SURG ORTHO LTX SZ8 (GLOVE) ×2 IMPLANT
GLOVE SURG UNDER POLY LF SZ8.5 (GLOVE) ×2 IMPLANT
GOWN STRL REUS W/ TWL LRG LVL3 (GOWN DISPOSABLE) ×3 IMPLANT
GOWN STRL REUS W/ TWL XL LVL3 (GOWN DISPOSABLE) ×1 IMPLANT
GOWN STRL REUS W/TWL LRG LVL3 (GOWN DISPOSABLE) ×6
GOWN STRL REUS W/TWL XL LVL3 (GOWN DISPOSABLE) ×2
GUIDEWIRE .45X5.910 (WIRE) ×2 IMPLANT
HANDPIECE INTERPULSE COAX TIP (DISPOSABLE)
KIT BASIN OR (CUSTOM PROCEDURE TRAY) ×2 IMPLANT
KIT TURNOVER KIT B (KITS) ×2 IMPLANT
MANIFOLD NEPTUNE II (INSTRUMENTS) ×2 IMPLANT
NDL HYPO 25GX1X1/2 BEV (NEEDLE) IMPLANT
NEEDLE HYPO 25GX1X1/2 BEV (NEEDLE) IMPLANT
NS IRRIG 1000ML POUR BTL (IV SOLUTION) ×2 IMPLANT
PACK ORTHO EXTREMITY (CUSTOM PROCEDURE TRAY) ×2 IMPLANT
PAD ARMBOARD 7.5X6 YLW CONV (MISCELLANEOUS) ×4 IMPLANT
PAD CAST 4YDX4 CTTN HI CHSV (CAST SUPPLIES) ×1 IMPLANT
PADDING CAST COTTON 4X4 STRL (CAST SUPPLIES) ×2
PADDING UNDERCAST 2 STRL (CAST SUPPLIES) ×1
PADDING UNDERCAST 2X4 STRL (CAST SUPPLIES) IMPLANT
SET CYSTO W/LG BORE CLAMP LF (SET/KITS/TRAYS/PACK) IMPLANT
SET HNDPC FAN SPRY TIP SCT (DISPOSABLE) IMPLANT
SOAP 2 % CHG 4 OZ (WOUND CARE) ×2 IMPLANT
SPLINT FIBERGLASS 3X35 (CAST SUPPLIES) ×1 IMPLANT
SPONGE T-LAP 18X18 ~~LOC~~+RFID (SPONGE) ×2 IMPLANT
SPONGE T-LAP 4X18 ~~LOC~~+RFID (SPONGE) ×2 IMPLANT
SUT ETHIBOND 4 0 TF (SUTURE) ×1 IMPLANT
SUT ETHILON 4 0 PS 2 18 (SUTURE) IMPLANT
SUT ETHILON 5 0 P 3 18 (SUTURE)
SUT NYLON ETHILON 5-0 P-3 1X18 (SUTURE) IMPLANT
SUT PROLENE 3 0 PS 1 (SUTURE) ×1 IMPLANT
SUT PROLENE 4 0 PS 2 18 (SUTURE) ×5 IMPLANT
SWAB COLLECTION DEVICE MRSA (MISCELLANEOUS) ×2 IMPLANT
SWAB CULTURE ESWAB REG 1ML (MISCELLANEOUS) IMPLANT
SYR CONTROL 10ML LL (SYRINGE) IMPLANT
TOWEL GREEN STERILE (TOWEL DISPOSABLE) ×2 IMPLANT
TOWEL GREEN STERILE FF (TOWEL DISPOSABLE) ×2 IMPLANT
TUBE CONNECTING 12X1/4 (SUCTIONS) ×2 IMPLANT
UNDERPAD 30X36 HEAVY ABSORB (UNDERPADS AND DIAPERS) ×2 IMPLANT
WATER STERILE IRR 1000ML POUR (IV SOLUTION) ×2 IMPLANT
YANKAUER SUCT BULB TIP NO VENT (SUCTIONS) ×2 IMPLANT

## 2021-06-12 NOTE — Progress Notes (Signed)
Pacu Nursing Note about Prescription ? ?Dr Caralyn Guile had printed off patient's prescription and thought he had sent it to [facility].  ? ?Patient has some pain medication at home and can call Dr Angus Palms office in the morning if he will need more and a prescription can be called in for the patient. Dr Caralyn Guile did not have access to call it in at home.  ? ?Patient is aware.  ?

## 2021-06-12 NOTE — ED Triage Notes (Signed)
Pt bib ems from home. Pt was using a power saw when he nipped his left ring finger and tip of his pinky is removed. Pt has an approximate 3 inch laceration in the center of left palm.  ? ?50 mcg Fentanyl  ? ?Pinky finger is at the bedside on ice for about 20 minutes from ems. ?

## 2021-06-12 NOTE — Anesthesia Preprocedure Evaluation (Addendum)
Anesthesia Evaluation  ?Patient identified by MRN, date of birth, ID band ?Patient awake ? ? ? ?Reviewed: ?Allergy & Precautions, NPO status , Patient's Chart, lab work & pertinent test results ? ?Airway ?Mallampati: I ? ?TM Distance: >3 FB ?Neck ROM: Full ? ? ? Dental ?no notable dental hx. ? ?  ?Pulmonary ?Current Smoker and Patient abstained from smoking.,  ?  ?Pulmonary exam normal ? ? ? ? ? ? ? Cardiovascular ?hypertension, Pt. on medications ?Normal cardiovascular exam ? ? ?  ?Neuro/Psych ? Neuromuscular disease   ? GI/Hepatic ?GERD  Medicated and Controlled,(+) Hepatitis -, C  ?Endo/Other  ?negative endocrine ROS ? Renal/GU ?negative Renal ROS  ?negative genitourinary ?  ?Musculoskeletal ? ?(+) Arthritis ,  ? Abdominal ?(+) + obese,   ?Peds ? Hematology ?negative hematology ROS ?(+)   ?Anesthesia Other Findings ? ? Reproductive/Obstetrics ? ?  ? ? ? ? ? ? ? ? ? ? ? ? ? ?  ?  ? ? ? ? ? ? ? ? ?Anesthesia Physical ? ?Anesthesia Plan ? ?ASA: 2 ? ?Anesthesia Plan: General  ? ?Post-op Pain Management:  Regional for Post-op pain and Tylenol PO (pre-op)*, Celebrex PO (pre-op)* and Gabapentin PO (pre-op)*  ? ?Induction: Intravenous ? ?PONV Risk Score and Plan: 2 and Ondansetron, Dexamethasone and Midazolam ? ?Airway Management Planned: Oral ETT ? ?Additional Equipment: None ? ?Intra-op Plan:  ? ?Post-operative Plan: Extubation in OR ? ?Informed Consent: I have reviewed the patients History and Physical, chart, labs and discussed the procedure including the risks, benefits and alternatives for the proposed anesthesia with the patient or authorized representative who has indicated his/her understanding and acceptance.  ? ? ? ?Dental advisory given ? ?Plan Discussed with: CRNA ? ?Anesthesia Plan Comments:   ? ? ? ? ? ?Anesthesia Quick Evaluation ? ?

## 2021-06-12 NOTE — Discharge Instructions (Signed)
KEEP BANDAGE CLEAN AND DRY ?CALL OFFICE FOR F/U [facility] [phone] in 5 days ?KEEP HAND ELEVATED ABOVE HEART ?OK TO APPLY ICE TO OPERATIVE AREA ?CONTACT OFFICE IF ANY WORSENING PAIN OR CONCERNS.  ?

## 2021-06-12 NOTE — H&P (Signed)
Jamie [name] is an 51 y.o. male.   ?Chief Complaint: Power saw injury to the left hand ?HPI: Patient sustained the open injuries to the left small and ring fingers.  Patient is here for the treatment of the injury from the power saw to the left hand.  Patient recommended undergo surgical intervention ? ?Past Medical History:  ?Diagnosis Date  ? Anxiety   ? Arthritis   ? Chronic back pain   ? Chronic knee pain   ? GERD (gastroesophageal reflux disease)   ? occ  ? HCV antibody positive   ? Hypertension   ? "Dr Karie Kirks took me off meds"  diet control  ? Lumbar radiculopathy   ? Pre-diabetes   ? ? ?Past Surgical History:  ?Procedure Laterality Date  ? BACK SURGERY    ? CARPAL TUNNEL RELEASE Right 06/25/2013  ? Procedure: CARPAL TUNNEL RELEASE;  Surgeon: Carole Civil, MD;  [facility];  Service: Orthopedics;  Laterality: Right;  ? CARPAL TUNNEL RELEASE Left 07/25/2013  ? Procedure: LEFT CARPAL TUNNEL RELEASE;  Surgeon: Carole Civil, MD;  Location: [facility];  Service: Orthopedics;  Laterality: Left;  ? COLONOSCOPY WITH PROPOFOL N/A 04/05/2017  ? Procedure: COLONOSCOPY WITH PROPOFOL;  Surgeon: Daneil Dolin, MD;  [facility];  Service: Endoscopy;  Laterality: N/A;  12:30pm-pt notified to arrive at 9:15am for 10:45am procedure per KF  ? ELBOW SURGERY Left   ? FOOT SURGERY Right   ? HERNIA REPAIR Right   ? inguinal- age 29  ? KNEE ARTHROSCOPY WITH MEDIAL MENISECTOMY Left 12/07/2016  ? Procedure: KNEE ARTHROSCOPY WITH MEDIAL MENISECTOMY;  Surgeon: Carole Civil, MD;  Location: [facility];  Service: Orthopedics;  Laterality: Left;  ? KNEE SURGERY    ? left elbow    ? LUMBAR LAMINECTOMY/DECOMPRESSION MICRODISCECTOMY Left 10/06/2013  ? Procedure: Left Lumbar Three-four microdiskectomy;  Surgeon: Ophelia Charter, MD;  Location: [facility];  Service: Neurosurgery;  Laterality: Left;  Left Lumbar Three-four microdiskectomy  ? REPAIR OF RUPTURED PATELLA LIGAMENT Left 05/03/2020  ? Procedure: LEFT KNEE  PATELLECTOMY;  Surgeon: Frederik Pear, MD;  Location: [facility];  Service: Orthopedics;  Laterality: Left;  ? right foot    ? forgein body removal  ? right knee  orif right patella [place]  ? SEPTOPLASTY    ? TOTAL KNEE ARTHROPLASTY Left 02/18/2019  ? Procedure: TOTAL KNEE ARTHROPLASTY;  Surgeon: Carole Civil, MD;  Location: [facility];  Service: Orthopedics;  Laterality: Left;  ? ? ?Family History  ?Problem Relation Age of Onset  ? Heart disease Other   ? Arthritis Other   ? Cancer Other   ? Asthma Other   ? Diabetes Other   ? Kidney disease Other   ? Colon cancer Father 36  ? ?Social History:  reports that he has been smoking cigarettes. He has a 15.00 pack-year smoking history. He quit smokeless tobacco use about 30 years ago.  His smokeless tobacco use included chew. He reports current drug use. Frequency: 2.00 times per week. Drug: Marijuana. He reports that he does not drink alcohol. ? ?Allergies: No Known Allergies ? ?(Not in a hospital admission) ? ? ?No results found for this or any previous visit (from the past 48 hour(s)). ?DG Hand Complete Left ? ?Result Date: 06/12/2021 ?CLINICAL DATA:  Table saw accident. EXAM: LEFT HAND - COMPLETE 3+ VIEW COMPARISON:  None. FINDINGS: Traumatic amputation through the midportion of the fifth distal phalanx with multiple bony fragments  in the soft tissues. Traumatic laceration with a saw through the fifth middle phalanx oriented oblique direction with a fracture cleft extending to the articular surface of the PIP joint. No other fracture or dislocation.  No aggressive osseous lesion. IMPRESSION: 1. Traumatic amputation through the midportion of the fifth distal phalanx with multiple bony fragments in the soft tissues. 2. Traumatic laceration with a saw through the fifth middle phalanx oriented oblique direction with a fracture cleft extending to the articular surface of the PIP joint. Electronically Signed   By: Kathreen Devoid M.D.   On: 06/12/2021 14:07   ? ?ROS denies  chest pain shortness of breath,GI kidney or neurological complaints ? ?Blood pressure 99/72, pulse (!) 45, temperature 98 ?F (36.7 ?C), temperature source Oral, resp. rate 20, height '5\' 11"'$  (1.803 m), weight 101.6 kg, SpO2 94 %. ?Physical Exam  ?General Appearance:  Alert, cooperative, no distress, appears stated age  ?Head:  Normocephalic, without obvious abnormality, atraumatic  ?Eyes:  Pupils equal, conjunctiva/corneas clear,   ?   ?   ?Throat: Lips, mucosa, and tongue normal; teeth and gums normal  ?Neck: No visible masses  ?   ?Lungs:   respirations unlabored  ?Chest Wall:  No tenderness or deformity  ?Heart:  Regular rate and rhythm,  ?Abdomen:   Soft, non-tender,   ?   ?   ?Extremities: The patient does have a traumatic amputation to the left small finger just distal to the DIP joint.  Patient has the laceration to the ring finger along the ulnar border as well as the traumatic injury to the palm.  Patient has good mobility to the index and thumb and long finger.  Good capillary refill good blood flow.  ?Pulses: 2+ and symmetric  ?Skin: Skin color, texture, turgor normal, no rashes or lesions  ?   ?Neurologic: Normal  ?  ? ?Assessment/Plan ?Left small finger traumatic amputation ?Left ring finger open middle phalanx fracture ?Left palm traumatic laceration ? ?Today the findings reviewed with the patient we talked about the complex nature of this injury.  We talked about revision amputation to the small finger as well as repair of the middle phalanx fracture stabilization and likely repair of the extensor mechanism.  Possible fracture stabilization.  We also talked about repair and exploration of the wound volarly. ? ?Surgical intervention recommended on an urgent basis given the nature of the injury and the open fractures. ? ? ?R/B/A DISCUSSED WITH PT IN ED ?PT VOICED UNDERSTANDING OF PLAN ?CONSENT SIGNED DAY OF SURGERY ?PT SEEN AND EXAMINED PRIOR TO OPERATIVE PROCEDURE/DAY OF SURGERY ?SITE MARKED. ?QUESTIONS  ANSWERED ?WILL GO HOME FOLLOWING SURGERY  ? ? ?WE ARE PLANNING SURGERY FOR YOUR UPPER EXTREMITY. THE RISKS AND BENEFITS OF SURGERY INCLUDE BUT NOT LIMITED TO BLEEDING INFECTION, DAMAGE TO NEARBY NERVES ARTERIES TENDONS, FAILURE OF SURGERY TO ACCOMPLISH ITS INTENDED GOALS, PERSISTENT SYMPTOMS AND NEED FOR FURTHER SURGICAL INTERVENTION. WITH THIS IN MIND WE WILL PROCEED. I HAVE DISCUSSED WITH THE PATIENT THE PRE AND POSTOPERATIVE REGIMEN AND THE DOS AND DON'TS. PT VOICED UNDERSTANDING AND INFORMED CONSENT SIGNED.  ? ?Linna Hoff ?06/12/2021, 2:56 PM  ?

## 2021-06-12 NOTE — Op Note (Signed)
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 ?POSTOPERATIVE DIAGNOSIS: Same ? ?ATTENDING SURGEON: Dr. Iran Planas who scrubbed and present for the entire procedure ? ?ASSISTANT SURGEON: None ? ?ANESTHESIA: General via endotracheal tube ? ?OPERATIVE PROCEDURE: ?Open debridement of skin subcutaneous tissue and bone associated with open fracture left ring finger ?Open treatment of left ring finger middle phalanx fracture involving the articular surface of the interphalangeal joint requiring internal fixation ?Repair left ring finger extensor mechanism zone 2 ?Open treatment of left small finger open distal phalanx fracture associated with debridement of the skin subcutaneous tissue and bone ?Amputation left small finger with local advancement flaps and neurectomies ?Traumatic laceration repair left hand greater than 5 cm ?Radiographs 3 views left ring finger ? ?IMPLANTS: Two 0.045 K wires ? ?EBL: Minimal ? ?RADIOGRAPHIC INTERPRETATION: AP lateral and oblique views of the finger do show the cross K wire fixation in place with good alignment of the middle phalanx ? ?SURGICAL INDICATIONS: Patient is a right-hand-dominant gentleman who was at home using a power saw.  Patient sustained significant injury to the left hand.  Patient was seen and evaluated in the hospital and recommended undergo the above procedure.  The risks of surgery include but not limited to bleeding infection damage nearby nerves arteries or tendons nonunion malunion hardware failure need for further surgical invention.  Signed informed consent was obtained on the day of surgery. ? ?SURGICAL TECHNIQUE: The patient was palpated find the preoperative holding area marked apart a marker made in the left hand indicate  correct operative site.  The patient brought back operating placed supine on the anesthesia table where the general anesthetic was administered.  Preoperative antibiotics were given prior to skin incision.  A well-padded tourniquet placed on the left brachium and sealed with the appropriate drape.  The left upper extremities then prepped and draped in normal sterile fashion.  A timeout was called the correct site was identified the procedure then began.  Attention then turned to the palm the greater than 5 cm laceration was then extended at the wrist crease.  Dissection carried down through the skin and subcutaneous tissue with a laceration extended to the palmar fascia but not through the palmaris longus nor the palmar fascia.  There is no injury to the median nerve.  The wound was then thoroughly irrigated debridement of the skin and subcutaneous tissue was then done of the devitalized tissue and the traumatic laceration was repaired with Prolene suture.  This is greater than 5 cm laceration.  After repair the laceration attention was then turned to the small finger.  Excisional debridement of the skin subcutaneous tissue and bone was then done of the open fracture.  This was done with sharp scissors knife.  Amputation was completed through the level of the distal interphalangeal joint.  The flexor digitorum profundus was resected and allowed to retract proximally.  Local neurectomies were then carried out.  Skin flaps were then mobilized.  VY flap was then created volarly to allow for skin closure.  Advancement flap closure was then carried out and skin was then closed using simple Prolene sutures.  Attention was then turned to the ring finger excisional debridement was then carried out of the wound directly over the middle phalanx.  Debridement of the skin subcutaneous tissue  and bone was then carried out of the open fracture.  The patient did have the comminuted segments that were removed.  This fracture  involve the interphalangeal joint and articular surface of the proximal to phalangeal joint.  2 K wires were then placed through the fracture site and out proximally.  Under direct visualization the K wires were then carefully advanced across the fracture site the radial K wire was advanced across the distal interphalangeal joint for additional purchase.  The K wires were then cut and then bent left out of the skin.  Following this zone 2 extensor mechanism was good repair.  This was distal to the central slip.  The extensor mechanism what was left of it was then repaired with horizontal mattress and figure-of-eight Ethibond 4-0 sutures.  Following this skin flaps were then advanced and closed with simple Prolene sutures.  Final radiographs were then obtained.  Xeroform was placed around the pin sites.  Adaptic dressing applied to the wounds.  Sterile compressive bandage then applied.  The patient was then placed in a well-padded ulnar gutter splint taken recovery room in good condition. ? ?POSTOPERATIVE PLAN: Patient to be discharged to home.  See him back in the office in 5 days for wound check.  Down to see her therapist for an ulnar gutter splint.  Radiographs at the first visit.  Small tip protector splint for the small finger at the first visit.  See him back at the 2-week mark for likely suture removal and wound check.  Radiographs at each visit.  K wires maintain approximately 5 to 6 weeks to the ring finger to allow for the extensor mechanism repair to be protected. ?

## 2021-06-12 NOTE — Transfer of Care (Signed)
Immediate Anesthesia Transfer of Care Note ? ?Patient: Jamie [name] ? ?Procedure(s) Performed: IRRIGATION AND DEBRIDEMENT (Left: Hand) ?OPEN REDUCTION INTERNAL FIXATION (ORIF) HAND (Left: Hand) ? ?Patient Location: PACU ? ?Anesthesia Type:General ? ?Level of Consciousness: awake and alert  ? ?Airway & Oxygen Therapy: Patient Spontanous Breathing ? ?Post-op Assessment: Report given to RN and Post -op Vital signs reviewed and stable ? ?Post vital signs: Reviewed and stable ? ?Last Vitals:  ?Vitals Value Taken Time  ?BP 117/73 06/12/21 1745  ?Temp 36.3 ?C 06/12/21 1745  ?Pulse 57 06/12/21 1752  ?Resp 17 06/12/21 1752  ?SpO2 96 % 06/12/21 1752  ?Vitals shown include unvalidated device data. ? ?Last Pain:  ?Vitals:  ? 06/12/21 1745  ?TempSrc:   ?PainSc: 0-No pain  ?   ? ?  ? ?Complications: No notable events documented. ?

## 2021-06-12 NOTE — Anesthesia Procedure Notes (Signed)
Procedure Name: Intubation ?Date/Time: 06/12/2021 4:25 PM ?Performed by: Janene Harvey, CRNA ?Pre-anesthesia Checklist: Patient identified, Emergency Drugs available, Suction available and Patient being monitored ?Patient Re-evaluated:Patient Re-evaluated prior to induction ?Oxygen Delivery Method: Circle system utilized ?Preoxygenation: Pre-oxygenation with 100% oxygen ?Induction Type: IV induction and Rapid sequence ?Laryngoscope Size: Mac and 4 ?Grade View: Grade I ?Tube type: Oral ?Tube size: 7.5 mm ?Number of attempts: 1 ?Airway Equipment and Method: Stylet and Oral airway ?Placement Confirmation: ETT inserted through vocal cords under direct vision, positive ETCO2 and breath sounds checked- equal and bilateral ?Secured at: 22 cm ?Tube secured with: Tape ?Dental Injury: Teeth and Oropharynx as per pre-operative assessment  ? ? ? ? ?

## 2021-06-12 NOTE — ED Provider Notes (Signed)
?[facility] ?Provider Note ? ? ?CSN: [id number] ?Arrival date & time: 06/12/21  1328 ? ?  ? ?History ? ?Chief Complaint  ?Patient presents with  ? Hand Injury  ? ? ?Jamie [name] is a 51 y.o. male presenting with a hand injury.  He reports that he was working with a power saw and sliced through the fingers and palm of his left hand.  His last tetanus shot was last year.  Reports that he had some ginger ale this morning however has not eaten since yesterday.  Denies any numbness or tingling in the left hand.  States he is still able to move all of his fingers.  Is not on blood thinners.  Reports EMS fentanyl helped his pain. ? ?Home Medications ?Prior to Admission medications   ?Medication Sig Start Date End Date Taking? Authorizing Provider  ?aspirin EC 81 MG tablet Take 1 tablet (81 mg total) by mouth 2 (two) times daily. 05/03/20   Leighton Parody, PA-C  ?cloNIDine (CATAPRES) 0.1 MG tablet Take 0.1 mg by mouth 3 (three) times daily.    [provider]  ?diazepam (VALIUM) 10 MG tablet Take 10 mg by mouth 2 (two) times daily.  05/19/18   [provider]  ?gabapentin (NEURONTIN) 100 MG capsule TAKE 1 CAPSULE(100 MG) BY MOUTH THREE TIMES DAILY 03/28/21   Carole Civil, MD  ?hydrochlorothiazide (HYDRODIURIL) 25 MG tablet Take 25 mg by mouth daily.  06/05/18   [provider]  ?oxyCODONE-acetaminophen (PERCOCET/ROXICET) 5-325 MG tablet Take 1 tablet by mouth every 4 (four) hours as needed for severe pain. 05/03/20   Leighton Parody, PA-C  ?pantoprazole (PROTONIX) 40 MG tablet Take 40 mg by mouth daily.  05/09/18   [provider]  ?tiZANidine (ZANAFLEX) 2 MG tablet Take 1 tablet (2 mg total) by mouth every 6 (six) hours as needed. 05/03/20   Leighton Parody, PA-C  ?   ? ?Allergies    ?Patient has no known allergies.   ? ?Review of Systems   ?Review of Systems ? ?Physical Exam ?Updated Vital Signs ?BP 99/72 (BP Location: Right Arm)   Pulse (!)  45   Temp 98 ?F (36.7 ?C) (Oral)   Resp 20   Ht '5\' 11"'$  (1.803 m)   Wt 101.6 kg   SpO2 94%   BMI 31.24 kg/m?  ?Physical Exam ?Vitals and nursing note reviewed.  ?Constitutional:   ?   Appearance: Normal appearance.  ?HENT:  ?   Head: Normocephalic and atraumatic.  ?Eyes:  ?   General: No scleral icterus. ?   Conjunctiva/sclera: Conjunctivae normal.  ?Pulmonary:  ?   Effort: Pulmonary effort is normal. No respiratory distress.  ?Musculoskeletal:     ?   General: Tenderness, deformity and signs of injury present. Normal range of motion.  ?   Comments: Photos in patient's chart.  Large palmar laceration, dorsal laceration to middle finger and complete amputation of the fifth digit distal to the DIP.   ? ?No noted foreign bodies.  Range of motion of all digits still intact.  No sensation deficits proximally or distally.  Bleeding well controlled.  ?Skin: ?   Capillary Refill: Capillary refill takes less than 2 seconds.  ?   Findings: No rash.  ?Neurological:  ?   Mental Status: He is alert.  ?Psychiatric:     ?   Mood and Affect: Mood normal.  ? ? ?ED Results / Procedures / Treatments   ?  Labs ?(all labs ordered are listed, but only abnormal results are displayed) ?Labs Reviewed - No data to display ? ?EKG ?None ? ?Radiology ?DG Hand Complete Left ? ?Result Date: 06/12/2021 ?CLINICAL DATA:  Table saw accident. EXAM: LEFT HAND - COMPLETE 3+ VIEW COMPARISON:  None. FINDINGS: Traumatic amputation through the midportion of the fifth distal phalanx with multiple bony fragments in the soft tissues. Traumatic laceration with a saw through the fifth middle phalanx oriented oblique direction with a fracture cleft extending to the articular surface of the PIP joint. No other fracture or dislocation.  No aggressive osseous lesion. IMPRESSION: 1. Traumatic amputation through the midportion of the fifth distal phalanx with multiple bony fragments in the soft tissues. 2. Traumatic laceration with a saw through the fifth middle  phalanx oriented oblique direction with a fracture cleft extending to the articular surface of the PIP joint. Electronically Signed   By: Kathreen Devoid M.D.   On: 06/12/2021 14:07   ? ?Procedures ?.Nerve Block ? ?Date/Time: 06/12/2021 3:00 PM ?Performed by: Rhae Hammock, PA-C ?Authorized by: Rhae Hammock, PA-C  ? ?Consent:  ?  Consent obtained:  Verbal ?  Consent given by:  Patient ?  Risks, benefits, and alternatives were discussed: yes   ?  Risks discussed:  Nerve damage, intravenous injection, pain and unsuccessful block ?Universal protocol:  ?  Test results available: yes   ?  Imaging studies available: yes   ?  Patient identity confirmed:  Verbally with patient ?Indications:  ?  Indications:  Pain relief ?Location:  ?  Body area:  Upper extremity ?  Upper extremity nerve:  Metacarpal (digits 3-5) ?  Laterality:  Left ?Pre-procedure details:  ?  Skin preparation:  Alcohol ?Skin anesthesia:  ?  Skin anesthesia method:  Local infiltration ?  Local anesthetic:  Lidocaine 2% w/o epi ?Procedure details:  ?  Block needle gauge:  25 G ?  Anesthetic injected:  Lidocaine 2% w/o epi ?  Additive injected:  None ?  Injection procedure:  Anatomic landmarks identified, anatomic landmarks palpated, negative aspiration for blood and introduced needle ?  Paresthesia:  Immediately resolved ?Post-procedure details:  ?  Outcome:  Pain relieved ?  Procedure completion:  Tolerated well, no immediate complications  ? ? ?Medications Ordered in ED ?Medications  ?fentaNYL (SUBLIMAZE) injection 50 mcg (has no administration in time range)  ?ceFAZolin (ANCEF) IVPB 1 g/50 mL premix (has no administration in time range)  ?LORazepam (ATIVAN) tablet 0.5 mg (0.5 mg Oral Given 06/12/21 1348)  ?lidocaine (PF) (XYLOCAINE) 1 % injection 5 mL (5 mLs Intradermal Given by Other 06/12/21 1349)  ? ? ?ED Course/ Medical Decision Making/ A&P ?  ?                        ?Medical Decision Making ?Amount and/or Complexity of Data Reviewed ?Radiology:  ordered. ? ?Risk ?Prescription drug management. ? ? ?51 year old male with tetanus up-to-date and last meal last night presenting with a hand injury.  Photos are in patient's chart.  Range of motion and sensation intact prior to digital block. ? ?Digital block appears successful.  Patient continues to have full range of motion and strong radial pulse.  Normal cap refill of the first through fourth digits. ? ?Imaging: Patient's x-ray viewed by me.  Noted to have a complete traumatic amputation of the distal fifth digit.  Radiology read is below: ? ?IMPRESSION: 1. Traumatic amputation through the midportion of the fifth distal  phalanx with multiple bony fragments in the soft tissues. 2. Traumatic laceration with a saw through the fifth middle phalanx oriented oblique direction with a fracture cleft extending to the articular surface of the PIP joint. ? ?Dr. Apolonio Schneiders with hand was consulted.  He stated that the patient will likely go to surgery today.  I have ordered a gram of Ancef and spoke with the patient and his mother and brother.  They are agreeable to surgery and patient reports his pain is around a 3 or 4 at this time. ? ?Final Clinical Impression(s) / ED Diagnoses ?Final diagnoses:  ?Injury of left hand, initial encounter  ? ? ?Rx / DC Orders ?Admit to Dr. Apolonio Schneiders with hand surgery ?  ?Rhae Hammock, PA-C ?06/12/21 1505 ? ?  ?Regan Lemming, MD ?06/12/21 1627 ? ?

## 2021-06-13 ENCOUNTER — Encounter (HOSPITAL_COMMUNITY): Payer: Self-pay | Admitting: Orthopedic Surgery

## 2021-06-14 NOTE — Anesthesia Postprocedure Evaluation (Signed)
Anesthesia Post Note ? ?Patient: Jamie [name] ? ?Procedure(s) Performed: IRRIGATION AND DEBRIDEMENT (Left: Hand) ?OPEN REDUCTION INTERNAL FIXATION (ORIF) HAND (Left: Hand) ? ?  ? ?Patient location during evaluation: PACU ?Anesthesia Type: General ?Level of consciousness: awake and alert ?Pain management: pain level controlled ?Vital Signs Assessment: post-procedure vital signs reviewed and stable ?Respiratory status: spontaneous breathing, nonlabored ventilation, respiratory function stable and patient connected to nasal cannula oxygen ?Cardiovascular status: blood pressure returned to baseline and stable ?Postop Assessment: no apparent nausea or vomiting ?Anesthetic complications: no ? ? ?No notable events documented. ? ?Last Vitals:  ?Vitals:  ? 06/12/21 1800 06/12/21 1810  ?BP: 128/85   ?Pulse: (!) 59 (!) 56  ?Resp: 14 14  ?Temp:  (!) 36.3 ?C  ?SpO2: 93% 94%  ?  ?Last Pain:  ?Vitals:  ? 06/12/21 1810  ?TempSrc:   ?PainSc: 0-No pain  ? ? ?  ?  ?  ?  ?  ?  ? ?Suzette Battiest E ? ? ? ? ?

## 2021-06-17 DIAGNOSIS — M25642 Stiffness of left hand, not elsewhere classified: Secondary | ICD-10-CM | POA: Diagnosis not present

## 2021-06-17 DIAGNOSIS — S68119A Complete traumatic metacarpophalangeal amputation of unspecified finger, initial encounter: Secondary | ICD-10-CM | POA: Insufficient documentation

## 2021-07-14 DIAGNOSIS — Z4789 Encounter for other orthopedic aftercare: Secondary | ICD-10-CM | POA: Insufficient documentation

## 2021-07-29 DIAGNOSIS — M79642 Pain in left hand: Secondary | ICD-10-CM | POA: Insufficient documentation

## 2021-10-19 DIAGNOSIS — M25562 Pain in left knee: Secondary | ICD-10-CM | POA: Diagnosis not present

## 2021-10-19 DIAGNOSIS — M25561 Pain in right knee: Secondary | ICD-10-CM | POA: Diagnosis not present

## 2021-10-19 DIAGNOSIS — F411 Generalized anxiety disorder: Secondary | ICD-10-CM | POA: Diagnosis not present

## 2021-10-19 DIAGNOSIS — Z79899 Other long term (current) drug therapy: Secondary | ICD-10-CM | POA: Diagnosis not present

## 2021-10-19 DIAGNOSIS — G8929 Other chronic pain: Secondary | ICD-10-CM | POA: Diagnosis not present

## 2021-10-19 DIAGNOSIS — M545 Low back pain, unspecified: Secondary | ICD-10-CM | POA: Diagnosis not present

## 2021-10-21 DIAGNOSIS — Z79899 Other long term (current) drug therapy: Secondary | ICD-10-CM | POA: Diagnosis not present

## 2021-11-15 DIAGNOSIS — Z7189 Other specified counseling: Secondary | ICD-10-CM | POA: Diagnosis not present

## 2021-11-15 DIAGNOSIS — J069 Acute upper respiratory infection, unspecified: Secondary | ICD-10-CM | POA: Diagnosis not present

## 2021-11-15 DIAGNOSIS — I10 Essential (primary) hypertension: Secondary | ICD-10-CM | POA: Diagnosis not present

## 2021-11-15 DIAGNOSIS — Z79899 Other long term (current) drug therapy: Secondary | ICD-10-CM | POA: Diagnosis not present

## 2021-11-15 DIAGNOSIS — F331 Major depressive disorder, recurrent, moderate: Secondary | ICD-10-CM | POA: Diagnosis not present

## 2021-11-15 DIAGNOSIS — Z800 Family history of malignant neoplasm of digestive organs: Secondary | ICD-10-CM | POA: Diagnosis not present

## 2021-11-15 DIAGNOSIS — F419 Anxiety disorder, unspecified: Secondary | ICD-10-CM | POA: Diagnosis not present

## 2021-11-15 DIAGNOSIS — R110 Nausea: Secondary | ICD-10-CM | POA: Diagnosis not present

## 2021-11-15 DIAGNOSIS — Z125 Encounter for screening for malignant neoplasm of prostate: Secondary | ICD-10-CM | POA: Diagnosis not present

## 2021-11-15 DIAGNOSIS — Z79891 Long term (current) use of opiate analgesic: Secondary | ICD-10-CM | POA: Diagnosis not present

## 2021-11-15 DIAGNOSIS — R5383 Other fatigue: Secondary | ICD-10-CM | POA: Diagnosis not present

## 2021-11-15 DIAGNOSIS — R630 Anorexia: Secondary | ICD-10-CM | POA: Diagnosis not present

## 2021-11-18 DIAGNOSIS — M25561 Pain in right knee: Secondary | ICD-10-CM | POA: Diagnosis not present

## 2021-11-18 DIAGNOSIS — G8929 Other chronic pain: Secondary | ICD-10-CM | POA: Diagnosis not present

## 2021-11-18 DIAGNOSIS — F411 Generalized anxiety disorder: Secondary | ICD-10-CM | POA: Diagnosis not present

## 2021-11-18 DIAGNOSIS — M25562 Pain in left knee: Secondary | ICD-10-CM | POA: Diagnosis not present

## 2021-11-18 DIAGNOSIS — Z79899 Other long term (current) drug therapy: Secondary | ICD-10-CM | POA: Diagnosis not present

## 2021-11-18 DIAGNOSIS — M545 Low back pain, unspecified: Secondary | ICD-10-CM | POA: Diagnosis not present

## 2021-11-22 DIAGNOSIS — Z79899 Other long term (current) drug therapy: Secondary | ICD-10-CM | POA: Diagnosis not present

## 2021-11-23 NOTE — Progress Notes (Unsigned)
Referring Provider:*** Primary Care Physician:  Lemmie Evens, MD Primary Gastroenterologist:  Dr. Gala Romney  No chief complaint on file.   HPI:   Jamie [name] is a 51 y.o. male with history of Hep C genotype 1a s/p treatment with Mavyret in 2018 with SVR achieved, F3/F4 on elastography in 2018, presenting today for diarrhea, weight loss, stomach burning***  Today:      No recent labs to calculate MELD.   Last colonoscopy December 2018 with normal exam. Recommended 5 year repeat due to family history.   Prior EGD:   Past Medical History:  Diagnosis Date   Anxiety    Arthritis    Chronic back pain    Chronic knee pain    GERD (gastroesophageal reflux disease)    occ   HCV antibody positive    Hypertension    "Dr Karie Kirks took me off meds"  diet control   Lumbar radiculopathy    Pre-diabetes     Past Surgical History:  Procedure Laterality Date   BACK SURGERY     CARPAL TUNNEL RELEASE Right 06/25/2013   Procedure: CARPAL TUNNEL RELEASE;  Surgeon: Carole Civil, MD;  Location: [facility];  Service: Orthopedics;  Laterality: Right;   CARPAL TUNNEL RELEASE Left 07/25/2013   Procedure: LEFT CARPAL TUNNEL RELEASE;  Surgeon: Carole Civil, MD;  Location: [facility];  Service: Orthopedics;  Laterality: Left;   COLONOSCOPY WITH PROPOFOL N/A 04/05/2017   Procedure: COLONOSCOPY WITH PROPOFOL;  Surgeon: Daneil Dolin, MD;  Location: [facility];  Service: Endoscopy;  Laterality: N/A;  12:30pm-pt notified to arrive at 9:15am for 10:45am procedure per KF   ELBOW SURGERY Left    FOOT SURGERY Right    HERNIA REPAIR Right    inguinal- age 57   I & D EXTREMITY Left 06/12/2021   Procedure: IRRIGATION AND DEBRIDEMENT;  Surgeon: Iran Planas, MD;  Location: [facility];  Service: Orthopedics;  Laterality: Left;   KNEE ARTHROSCOPY WITH MEDIAL MENISECTOMY Left 12/07/2016   Procedure: KNEE ARTHROSCOPY WITH MEDIAL MENISECTOMY;  Surgeon: Carole Civil, MD;  Location: [facility];  Service:  Orthopedics;  Laterality: Left;   KNEE SURGERY     left elbow     LUMBAR LAMINECTOMY/DECOMPRESSION MICRODISCECTOMY Left 10/06/2013   Procedure: Left Lumbar Three-four microdiskectomy;  Surgeon: Ophelia Charter, MD;  Location: [facility];  Service: Neurosurgery;  Laterality: Left;  Left Lumbar Three-four microdiskectomy   OPEN REDUCTION INTERNAL FIXATION (ORIF) HAND Left 06/12/2021   Procedure: OPEN REDUCTION INTERNAL FIXATION (ORIF) HAND;  Surgeon: Iran Planas, MD;  Location: [facility];  Service: Orthopedics;  Laterality: Left;   REPAIR OF RUPTURED PATELLA LIGAMENT Left 05/03/2020   Procedure: LEFT KNEE PATELLECTOMY;  Surgeon: Frederik Pear, MD;  Location: [facility];  Service: Orthopedics;  Laterality: Left;   right foot     forgein body removal   right knee  orif right patella Ke[place] 1993   SEPTOPLASTY     TOTAL KNEE ARTHROPLASTY Left 02/18/2019   Procedure: TOTAL KNEE ARTHROPLASTY;  Surgeon: Carole Civil, MD;  Location: [facility];  Service: Orthopedics;  Laterality: Left;    Current Outpatient Medications  Medication Sig Dispense Refill   aspirin EC 81 MG tablet Take 1 tablet (81 mg total) by mouth 2 (two) times daily. (Patient not taking: Reported on 06/12/2021) 60 tablet 0   citalopram (CELEXA) 40 MG tablet Take 40 mg by mouth daily.     cloNIDine (CATAPRES) 0.1 MG tablet Take 0.1 mg by  mouth 3 (three) times daily.     diazepam (VALIUM) 10 MG tablet Take 10 mg by mouth 2 (two) times daily.      diclofenac Sodium (VOLTAREN) 1 % GEL Apply 1 application topically in the morning, at noon, in the evening, and at bedtime.     gabapentin (NEURONTIN) 100 MG capsule TAKE 1 CAPSULE(100 MG) BY MOUTH THREE TIMES DAILY (Patient taking differently: Take 100 mg by mouth 3 (three) times daily.) 60 capsule 5   hydrochlorothiazide (HYDRODIURIL) 25 MG tablet Take 25 mg by mouth daily.      pantoprazole (PROTONIX) 40 MG tablet Take 40 mg by mouth daily.      tiZANidine (ZANAFLEX) 2 MG tablet Take 1 tablet (2  mg total) by mouth every 6 (six) hours as needed. (Patient not taking: Reported on 06/12/2021) 60 tablet 0   No current facility-administered medications for this visit.    Allergies as of 11/24/2021   (No Known Allergies)    Family History  Problem Relation Age of Onset   Heart disease Other    Arthritis Other    Cancer Other    Asthma Other    Diabetes Other    Kidney disease Other    Colon cancer Father 74    Social History   Socioeconomic History   Marital status: Divorced    Spouse name: is seperated   Number of children: Not on file   Years of education: 8th grade    Highest education level: Not on file  Occupational History   Occupation: unemployed    Fish farm manager: unemployed  Tobacco Use   Smoking status: Some Days    Packs/day: 1.00    Years: 15.00    Total pack years: 15.00    Types: Cigarettes   Smokeless tobacco: Former    Types: Chew    Quit date: 12/05/1990   Tobacco comments:    one pack a week  Vaping Use   Vaping Use: Never used  Substance and Sexual Activity   Alcohol use: No   Drug use: Yes    Frequency: 2.0 times per week    Types: Marijuana    Comment: 3 to 4 days ago last use   Sexual activity: Never    Birth control/protection: None  Other Topics Concern   Not on file  Social History Narrative   Not on file   Social Determinants of Health   Financial Resource Strain: Not on file  Food Insecurity: Not on file  Transportation Needs: Not on file  Physical Activity: Not on file  Stress: Not on file  Social Connections: Not on file  Intimate Partner Violence: Not on file    Review of Systems: Gen: Denies any fever, chills, cold or flu like symptoms, pre-syncope, or syncope.  CV: Denies chest pain, heart palpitations. Resp: Denies shortness of breath, cough.  GI: See HPI GU : Denies urinary burning, urinary frequency, urinary hesitancy MS: Denies joint pain. Derm: Denies rash. Psych: Denies depression, anxiety. Heme: See  HPI  Physical Exam: There were no vitals taken for this visit. General:   Alert and oriented. Pleasant and cooperative. Well-nourished and well-developed.  Head:  Normocephalic and atraumatic. Eyes:  Without icterus, sclera clear and conjunctiva pink.  Ears:  Normal auditory acuity. Lungs:  Clear to auscultation bilaterally. No wheezes, rales, or rhonchi. No distress.  Heart:  S1, S2 present without murmurs appreciated.  Abdomen:  +BS, soft, non-tender and non-distended. No HSM noted. No guarding or rebound. No masses  appreciated.  Rectal:  Deferred  Msk:  Symmetrical without gross deformities. Normal posture. Extremities:  Without edema. Neurologic:  Alert and  oriented x4;  grossly normal neurologically. Skin:  Intact without significant lesions or rashes. Psych:  Normal mood and affect.    Assessment:     Plan:  ***   Aliene Altes, PA-C [facility] 11/24/2021

## 2021-11-24 ENCOUNTER — Encounter: Payer: Self-pay | Admitting: Gastroenterology

## 2021-11-24 ENCOUNTER — Ambulatory Visit (INDEPENDENT_AMBULATORY_CARE_PROVIDER_SITE_OTHER): Payer: Medicaid Other | Admitting: Gastroenterology

## 2021-11-24 ENCOUNTER — Telehealth: Payer: Self-pay | Admitting: *Deleted

## 2021-11-24 VITALS — BP 116/72 | HR 64 | Temp 98.0°F | Ht 70.0 in | Wt 208.8 lb

## 2021-11-24 DIAGNOSIS — K7400 Hepatic fibrosis, unspecified: Secondary | ICD-10-CM | POA: Diagnosis not present

## 2021-11-24 DIAGNOSIS — R1030 Lower abdominal pain, unspecified: Secondary | ICD-10-CM

## 2021-11-24 DIAGNOSIS — R194 Change in bowel habit: Secondary | ICD-10-CM | POA: Insufficient documentation

## 2021-11-24 DIAGNOSIS — R0602 Shortness of breath: Secondary | ICD-10-CM | POA: Diagnosis not present

## 2021-11-24 DIAGNOSIS — R634 Abnormal weight loss: Secondary | ICD-10-CM

## 2021-11-24 DIAGNOSIS — R079 Chest pain, unspecified: Secondary | ICD-10-CM | POA: Diagnosis not present

## 2021-11-24 DIAGNOSIS — K219 Gastro-esophageal reflux disease without esophagitis: Secondary | ICD-10-CM

## 2021-11-24 DIAGNOSIS — R0609 Other forms of dyspnea: Secondary | ICD-10-CM | POA: Insufficient documentation

## 2021-11-24 NOTE — Telephone Encounter (Signed)
Patient informed of abdominal US scheduled for 12/05/21,arrive at 10:15 am, nothing to eat or drink after midnight. Pt verbalized understanding.

## 2021-11-24 NOTE — Patient Instructions (Signed)
We will arrange for you to have an ultrasound of your abdomen at [facility].  I am requesting your recent blood work for review from primary care.  I will let you know if any additional blood work is needed.  We are referring you to cardiology to evaluate your chest pain and shortness of breath.  You need an upper endoscopy and a colonoscopy in the near future to evaluate your weight loss, change in bowel habits, mild lower abdominal pain, and lack of appetite.  We will work on scheduling this after you have been cleared by cardiology.  Monitor your abdominal pain and let me know if you have any worsening.  To help with bowel regularity, start Benefiber 2 teaspoons daily x2 weeks, then increase to twice daily.  If you develop diarrhea, please let me know.  Aliene Altes, PA-C [facility]

## 2021-11-29 DIAGNOSIS — R799 Abnormal finding of blood chemistry, unspecified: Secondary | ICD-10-CM | POA: Diagnosis not present

## 2021-11-30 DIAGNOSIS — R109 Unspecified abdominal pain: Secondary | ICD-10-CM | POA: Diagnosis not present

## 2021-11-30 DIAGNOSIS — M5116 Intervertebral disc disorders with radiculopathy, lumbar region: Secondary | ICD-10-CM | POA: Diagnosis not present

## 2021-11-30 DIAGNOSIS — Z79891 Long term (current) use of opiate analgesic: Secondary | ICD-10-CM | POA: Diagnosis not present

## 2021-11-30 DIAGNOSIS — F419 Anxiety disorder, unspecified: Secondary | ICD-10-CM | POA: Diagnosis not present

## 2021-12-01 ENCOUNTER — Telehealth: Payer: Self-pay | Admitting: Gastroenterology

## 2021-12-01 NOTE — Telephone Encounter (Signed)
Received and reviewed labs dated 11/15/2021. CMP: Glucose 108 (H), sodium 139, potassium 3.7, chloride 101, calcium 9.6, total protein 7.5, albumin 4.6, total bilirubin 0.8, alk phos 37, AST 12, ALT 9. CBC: WBC 7.3, hemoglobin 14.9, hematocrit 43.0, MCV 96.4, MCH 33.4 (H), MCHC 34.7, platelet count 248. TSH: 0.87. B12: 231.  Courtney: Please let patient know I received and reviewed labs completed in August with PCP.  Overall, labs look good.  We do need a couple additional labs to check his clotting function and as well as his immunity to hepatitis A and B.  Please arrange INR, Hep A Ab total, Hep B surface Ab. Dx: History of hepatitis C, hepatic fibrosis.   Mindy:  Can we see if patient's appointment with cardiology can be moved up. We need to schedule him for EGD and colonoscopy as soon as possible. He isn't scheduled to see cardiology until 10/31.

## 2021-12-02 ENCOUNTER — Other Ambulatory Visit: Payer: Self-pay | Admitting: *Deleted

## 2021-12-02 DIAGNOSIS — K7400 Hepatic fibrosis, unspecified: Secondary | ICD-10-CM

## 2021-12-02 DIAGNOSIS — B1920 Unspecified viral hepatitis C without hepatic coma: Secondary | ICD-10-CM

## 2021-12-02 NOTE — Telephone Encounter (Signed)
Spoke to pt, informed him of results and recommendations. Pt voiced understanding. Informed him to have labs completed. Labs entered into Epic.

## 2021-12-02 NOTE — Telephone Encounter (Signed)
Sent message to see

## 2021-12-05 ENCOUNTER — Ambulatory Visit (HOSPITAL_COMMUNITY)
Admission: RE | Admit: 2021-12-05 | Discharge: 2021-12-05 | Disposition: A | Discharge status: Home / Self Care | Payer: Medicaid Other | Source: Ambulatory Visit | Attending: Gastroenterology | Admitting: Gastroenterology

## 2021-12-05 DIAGNOSIS — E279 Disorder of adrenal gland, unspecified: Secondary | ICD-10-CM | POA: Insufficient documentation

## 2021-12-05 DIAGNOSIS — K7400 Hepatic fibrosis, unspecified: Secondary | ICD-10-CM | POA: Insufficient documentation

## 2021-12-05 DIAGNOSIS — B1920 Unspecified viral hepatitis C without hepatic coma: Secondary | ICD-10-CM | POA: Diagnosis not present

## 2021-12-05 NOTE — Telephone Encounter (Signed)
Noted  

## 2021-12-05 NOTE — Telephone Encounter (Signed)
Called cardiology [place] and was advised no sooner appts. He will be added to wait list.

## 2021-12-08 ENCOUNTER — Ambulatory Visit: Payer: Medicaid Other | Attending: Cardiology | Admitting: Internal Medicine

## 2021-12-08 ENCOUNTER — Encounter: Payer: Self-pay | Admitting: Internal Medicine

## 2021-12-08 ENCOUNTER — Encounter: Payer: Self-pay | Admitting: *Deleted

## 2021-12-08 VITALS — BP 120/80 | HR 46 | Ht 71.0 in | Wt 206.2 lb

## 2021-12-08 DIAGNOSIS — Z720 Tobacco use: Secondary | ICD-10-CM | POA: Diagnosis not present

## 2021-12-08 DIAGNOSIS — R072 Precordial pain: Secondary | ICD-10-CM

## 2021-12-08 DIAGNOSIS — R0609 Other forms of dyspnea: Secondary | ICD-10-CM | POA: Diagnosis not present

## 2021-12-08 LAB — BASIC METABOLIC PANEL
BUN/Creatinine Ratio: 12 (ref 9–20)
BUN: 12 mg/dL (ref 6–24)
CO2: 28 mmol/L (ref 20–29)
Calcium: 9.8 mg/dL (ref 8.7–10.2)
Chloride: 99 mmol/L (ref 96–106)
Creatinine, Ser: 1.04 mg/dL (ref 0.76–1.27)
Glucose: 86 mg/dL (ref 70–99)
Potassium: 4.1 mmol/L (ref 3.5–5.2)
Sodium: 139 mmol/L (ref 134–144)
eGFR: 86 mL/min/{1.73_m2} (ref >59)

## 2021-12-08 NOTE — Progress Notes (Signed)
Cardiology Office Note:    Date:  12/08/2021   ID:  Jamie [name], DOB 20-Aug-1970, MRN [id number]  PCP:  Lemmie Evens, [place] [facility] Cardiologist:  Werner Lean, MD     Referring MD: Erenest Rasher, PA-C   CC: Preoperative risk stratification for coloscopy Consulted for the evaluation of chest pain at the behest of Jamie [name]  History of Present Illness:    Jamie [name] is a 51 y.o. male with a hx of shortness of breath and chest pain, tobacco. Query of Rhabdomyolysis in 06/11/2010.  Patient notes that he is feeling alright.   Was last feeling well around Easter.  After this has been losing weight for the last couple months.  Feels tired and drained.  More than usual.  Stills smokes.  Mother had pancreatic cancer and father had lung cancer.  Is having chest pressure that comes and goes.  Occurs at night.  And during the day.  Sometimes it is at rest.  Does yard work that sometimes brings on chest pressure.  Has rare palpitations. No syncope or near syncope.  No shortness of breath, DOE that occurs with activity.  No PND or orthopne.  No weight gain, leg swelling , or abdominal swelling.    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 With elevated cardiac enzymes. Lipids are pending from last PCP visit.  Ambulatory BP 110/86.   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   Hypertension    "Dr Karie Kirks took me off meds"  diet control   Lumbar radiculopathy    Pre-diabetes     Past Surgical History:  Procedure Laterality Date   BACK SURGERY     CARPAL TUNNEL RELEASE Right 06/25/2013   Procedure: CARPAL TUNNEL RELEASE;  Surgeon:  Carole Civil, MD;  Location: [facility];  Service: Orthopedics;  Laterality: Right;   CARPAL TUNNEL RELEASE Left 07/25/2013   Procedure: LEFT CARPAL TUNNEL RELEASE;  Surgeon: Carole Civil, MD;  Location: [facility];  Service: Orthopedics;  Laterality: Left;   COLONOSCOPY WITH PROPOFOL N/A 04/05/2017   Surgeon: Daneil Dolin, MD; normal exam. Recommended 5 year repeat due to family history   ELBOW SURGERY Left    FOOT SURGERY Right    HERNIA REPAIR Right    inguinal- age 76   I & D EXTREMITY Left 06/12/2021   Procedure: IRRIGATION AND DEBRIDEMENT;  Surgeon: Iran Planas, MD;  Location: [facility];  Service: Orthopedics;  Laterality: Left;   KNEE ARTHROSCOPY WITH MEDIAL MENISECTOMY Left 12/07/2016   Procedure: KNEE ARTHROSCOPY WITH MEDIAL MENISECTOMY;  Surgeon: Carole Civil, MD;  Location: [facility];  Service: Orthopedics;  Laterality: Left;   KNEE SURGERY     left elbow     LUMBAR LAMINECTOMY/DECOMPRESSION MICRODISCECTOMY Left 10/06/2013   Procedure: Left Lumbar Three-four microdiskectomy;  Surgeon: Ophelia Charter, MD;  Location: [facility];  Service: Neurosurgery;  Laterality: Left;  Left Lumbar Three-four microdiskectomy   OPEN REDUCTION INTERNAL FIXATION (ORIF) HAND Left 06/12/2021   Procedure: OPEN REDUCTION INTERNAL FIXATION (ORIF) HAND;  Surgeon: Iran Planas, MD;  Location: [facility];  Service: Orthopedics;  Laterality: Left;   REPAIR OF RUPTURED PATELLA LIGAMENT Left 05/03/2020   Procedure: LEFT KNEE PATELLECTOMY;  Surgeon: Frederik Pear, MD;  Location: [facility];  Service: Orthopedics;  Laterality: Left;   right foot     forgein body removal   right knee  orif right patella [place] 1993   SEPTOPLASTY     TOTAL KNEE ARTHROPLASTY Left 02/18/2019   Procedure: TOTAL KNEE ARTHROPLASTY;  Surgeon: Carole Civil, MD;  Location: [facility];  Service: Orthopedics;  Laterality: Left;    Current Medications: Current Meds  Medication Sig   citalopram (CELEXA) 20 MG tablet Take 20 mg  by mouth daily.   citalopram (CELEXA) 40 MG tablet Take 40 mg by mouth daily.   cloNIDine (CATAPRES) 0.1 MG tablet Take 0.1 mg by mouth 3 (three) times daily.   cloNIDine (CATAPRES) 0.1 MG tablet Take by mouth 3 (three) times daily.   diazepam (VALIUM) 10 MG tablet Take 10 mg by mouth 2 (two) times daily.    diclofenac Sodium (VOLTAREN) 1 % GEL Apply 1 application topically in the morning, at noon, in the evening, and at bedtime.   gabapentin (NEURONTIN) 100 MG capsule TAKE 1 CAPSULE(100 MG) BY MOUTH THREE TIMES DAILY   hydrochlorothiazide (HYDRODIURIL) 25 MG tablet Take 25 mg by mouth daily.    naloxone (NARCAN) nasal spray 4 mg/0.1 mL as needed. overdose   oxyCODONE-acetaminophen (PERCOCET/ROXICET) 5-325 MG tablet Take 1 tablet by mouth every 6 (six) hours as needed for severe pain.   pantoprazole (PROTONIX) 40 MG tablet Take 40 mg by mouth daily.    pantoprazole (PROTONIX) 40 MG tablet Take 1 tablet by mouth daily.     Allergies:   Doxepin, Dronabinol, Tramadol, and Trazodone   Social History   Socioeconomic History   Marital status: Divorced    Spouse name: is seperated   Number of children: Not on file   Years of education: 8th grade    Highest education level: Not on file  Occupational History   Occupation: unemployed    Fish farm manager: unemployed  Tobacco Use   Smoking status: Some Days    Packs/day: 1.00    Years: 15.00    Total pack years: 15.00    Types: Cigarettes   Smokeless tobacco: Former    Types: Chew    Quit date: 12/05/1990   Tobacco comments:    one pack a week  Vaping Use   Vaping Use: Never used  Substance and Sexual Activity   Alcohol use: No   Drug use: Yes    Frequency: 2.0 times per week    Types: Marijuana    Comment: 3 to 4 days ago last use   Sexual activity: Never    Birth control/protection: None  Other Topics Concern   Not on file  Social History Narrative   Not on file   Social Determinants of Health   Financial Resource Strain: Not on  file  Food Insecurity: Not on file  Transportation Needs: Not on file  Physical Activity: Not on file  Stress: Not on file  Social Connections: Not on file     Family History: The patient's nofamily history includes Arthritis in an other family member; Asthma in an other family member; Cancer in an other family member; Colon cancer (age of onset: 60) in his father; Diabetes in an other family member; Heart disease in an other family member; Kidney disease in an other family member. History of coronary artery  disease notable for mother with MI in the 46s. History of heart failure notable for no members. History of arrhythmia notable for atrial fibrillation on father. Denies family history of sudden cardiac death including drowning, car accidents, or unexplained deaths in the family. No history of bicuspid aortic valve or aortic aneurysm or dissection.   No history of cardiomyopathies including hypertrophic cardiomyopathy, left ventricular non-compaction, or arrhythmogenic right ventricular cardiomyopathy. No history of skeletal myopathies.   ROS:   Please see the history of present illness.     All other systems reviewed and are negative.  EKGs/Labs/Other Studies Reviewed:    The following studies were reviewed today:  EKG:  EKG is  ordered today.  The ekg ordered today demonstrates  12/08/21: Sinus bradycardia no q waves  Recent Labs: 06/12/2021: BUN 11; Creatinine, Ser 0.90; Hemoglobin 13.6; Potassium 3.4; Sodium 138  Recent Lipid Panel    Component Value Date/Time   CHOL  06/10/2010 2133    131        ATP III CLASSIFICATION:  <200     mg/dL   Desirable  200-239  mg/dL   Borderline High  >=240    mg/dL   High          TRIG 67 06/10/2010 2133   HDL 39 (L) 06/10/2010 2133   CHOLHDL 3.4 06/10/2010 2133   VLDL 13 06/10/2010 2133   [place]  06/10/2010 2133    79        Total Cholesterol/HDL:CHD Risk Coronary Heart Disease Risk Table                     Men   Women  1/2  Average Risk   3.4   3.3  Average Risk       5.0   4.4  2 X Average Risk   9.6   7.1  3 X Average Risk  23.4   11.0        Use the calculated Patient Ratio above and the CHD Risk Table to determine the patient's CHD Risk.        ATP III CLASSIFICATION (LDL):  <100     mg/dL   Optimal  100-129  mg/dL   Near or Above                    Optimal  130-159  mg/dL   Borderline  160-189  mg/dL   High  >190     mg/dL   Very High        Physical Exam:    VS:  BP 120/80   Pulse (!) 46   Ht '5\' 11"'$  (1.803 m)   Wt 93.5 kg   SpO2 95%   BMI 28.76 kg/m     Wt Readings from Last 3 Encounters:  12/08/21 93.5 kg  11/24/21 94.7 kg  06/12/21 100.2 kg     GEN:  Well nourished, well developed in no acute distress HEENT: Normal NECK: No JVD; No carotid bruits LYMPHATICS: No lymphadenopathy CARDIAC: RRR, no murmurs, rubs, gallops RESPIRATORY:  Clear to auscultation without rales, wheezing or rhonchi  ABDOMEN: Soft, non-tender, non-distended MUSCULOSKELETAL:  No edema; No deformity  SKIN: Warm and dry NEUROLOGIC:  Alert and oriented x 3 PSYCHIATRIC:  Normal affect   ASSESSMENT:    1. Precordial pain   2. DOE (dyspnea on exertion)   3. Tobacco abuse    PLAN:    Chest pressure with exertion DOE Tobacco abuse Elevated CK and  CK MB of unclear etiology FX fo CAD, pancreatic and lung cancer Profound weight loss - Worried about obstructive disease, will get CCTA; in f/u may back of his clonidine given heart rate of 46 -If he has obstructive CAD will medically manage and see him in follow up; if no sx can go for coloscopy given his significant weight loss (send to Ms. Jodi Mourning - will get echo given unclear CK elevation and his DOE - discussed smoking cessation  Three to four months with me; if mild non-obstructive disease and no urgent cardiac issues will work through secondary prevention at that time     Medication Adjustments/Labs and Tests Ordered: Current medicines are reviewed  at length with the patient today.  Concerns regarding medicines are outlined above.  Orders Placed This Encounter  Procedures   CT CORONARY MORPH W/CTA COR W/SCORE W/CA W/CM &/OR WO/CM   Basic metabolic panel   EKG [id number]   ECHOCARDIOGRAM COMPLETE   No orders of the defined types were placed in this encounter.   Patient Instructions  Medication Instructions:  Your physician recommends that you continue on your current medications as directed. Please refer to the Current Medication list given to you today.  *If you need a refill on your cardiac medications before your next appointment, please call your pharmacy*   Lab Work: TODAY: BMP  If you have labs (blood work) drawn today and your tests are completely normal, you will receive your results only by: [place] (if you have MyChart) OR A paper copy in the mail If you have any lab test that is abnormal or we need to change your treatment, we will call you to review the results.   Testing/Procedures: Your physician has requested that you have cardiac CT. Cardiac computed tomography (CT) is a painless test that uses an x-ray machine to take clear, detailed pictures of your heart. For further information please visit HugeFiesta.[email or web address]. Please follow instruction sheet as given.  IN [place]: Your physician has requested that you have an echocardiogram. Echocardiography is a painless test that uses sound waves to create images of your heart. It provides your doctor with information about the size and shape of your heart and how well your heart's chambers and valves are working. This procedure takes approximately one hour. There are no restrictions for this procedure.   Follow-Up: At [facility], you and your health needs are our priority.  As part of our continuing mission to provide you with exceptional heart care, we have created designated Provider Care Teams.  These Care Teams include your primary Cardiologist  (physician) and Advanced Practice Providers (APPs -  Physician Assistants and Nurse Practitioners) who all work together to provide you with the care you need, when you need it.  We recommend signing up for the patient portal called "MyChart".  Sign up information is provided on this After Visit Summary.  MyChart is used to connect with patients for Virtual Visits (Telemedicine).  Patients are able to view lab/test results, encounter notes, upcoming appointments, etc.  Non-urgent messages can be sent to your provider as well.   To learn more about what you can do with MyChart, go to [email or web address].    Your next appointment:   3-4  month(s)  The format for your next appointment:   In Person  Provider:   Werner Lean, MD  or Melina Copa, PA-C, Ermalinda Barrios, PA-C, or Christen Bame, NP       Other Instructions  Your cardiac CT  will be scheduled at one of the below locations:   [facility] [address] [place], [place] [address] [phone]  [place] [address] [place], [place] [address] [phone]  If scheduled at [facility], please arrive at the [facility] and Children's Entrance (Entrance C2) of [facility] 30 minutes prior to test start time. You can use the FREE valet parking offered at entrance C (encouraged to control the heart rate for the test)  Proceed to the [facility] Radiology Department (first floor) to check-in and test prep.  All radiology patients and guests should use entrance C2 at [facility], accessed from [facility], even though the hospital's physical address listed is [address].    If scheduled at [facility], please arrive 15 mins early for check-in and test prep.  Please follow these instructions carefully (unless otherwise directed):  Hold all erectile dysfunction medications at least  3 days (72 hrs) prior to test.  On the Night Before the Test: Be sure to Drink plenty of water. Do not consume any caffeinated/decaffeinated beverages or chocolate 12 hours prior to your test. Do not take any antihistamines 12 hours prior to your test.   On the Day of the Test: Drink plenty of water until 1 hour prior to the test. Do not eat any food 4 hours prior to the test. You may take your regular medications prior to the test.  HOLD Hydrochlorothiazide morning of the test.       After the Test: Drink plenty of water. After receiving IV contrast, you may experience a mild flushed feeling. This is normal. On occasion, you may experience a mild rash up to 24 hours after the test. This is not dangerous. If this occurs, you can take Benadryl 25 mg and increase your fluid intake. If you experience trouble breathing, this can be serious. If it is severe call 911 IMMEDIATELY. If it is mild, please call our office. If you take any of these medications: Glipizide/Metformin, Avandament, Glucavance, please do not take 48 hours after completing test unless otherwise instructed.  We will call to schedule your test 2-4 weeks out understanding that some insurance companies will need an authorization prior to the service being performed.   For non-scheduling related questions, please contact the cardiac imaging nurse navigator should you have any questions/concerns: Marchia Bond, Cardiac Imaging Nurse Navigator Gordy Clement, Cardiac Imaging Nurse Navigator [facility] Direct Office Dial: [phone]   For scheduling needs, including cancellations and rescheduling, please call [place], [phone].   Important Information About Sugar         Signed, Werner Lean, MD  12/08/2021 11:15 AM    [place]

## 2021-12-08 NOTE — Patient Instructions (Signed)
Medication Instructions:  Your physician recommends that you continue on your current medications as directed. Please refer to the Current Medication list given to you today.  *If you need a refill on your cardiac medications before your next appointment, please call your pharmacy*   Lab Work: TODAY: BMP  If you have labs (blood work) drawn today and your tests are completely normal, you will receive your results only by: [place] (if you have MyChart) OR A paper copy in the mail If you have any lab test that is abnormal or we need to change your treatment, we will call you to review the results.   Testing/Procedures: Your physician has requested that you have cardiac CT. Cardiac computed tomography (CT) is a painless test that uses an x-ray machine to take clear, detailed pictures of your heart. For further information please visit HugeFiesta.tn. Please follow instruction sheet as given.  IN [place]: Your physician has requested that you have an echocardiogram. Echocardiography is a painless test that uses sound waves to create images of your heart. It provides your doctor with information about the size and shape of your heart and how well your heart's chambers and valves are working. This procedure takes approximately one hour. There are no restrictions for this procedure.   Follow-Up: At [facility], you and your health needs are our priority.  As part of our continuing mission to provide you with exceptional heart care, we have created designated Provider Care Teams.  These Care Teams include your primary Cardiologist (physician) and Advanced Practice Providers (APPs -  Physician Assistants and Nurse Practitioners) who all work together to provide you with the care you need, when you need it.  We recommend signing up for the patient portal called "MyChart".  Sign up information is provided on this After Visit Summary.  MyChart is used to connect with patients  for Virtual Visits (Telemedicine).  Patients are able to view lab/test results, encounter notes, upcoming appointments, etc.  Non-urgent messages can be sent to your provider as well.   To learn more about what you can do with MyChart, go to [email or web address].    Your next appointment:   3-4  month(s)  The format for your next appointment:   In Person  Provider:   Werner Lean, MD  or Melina Copa, PA-C, Ermalinda Barrios, PA-C, or Christen Bame, NP       Other Instructions  Your cardiac CT will be scheduled at one of the below locations:   [facility] [address] [place], [place] [address] [phone]  [place] [address] [place], [place] [address] [phone]  If scheduled at [facility], please arrive at the [facility] and Children's Entrance (Entrance C2) of [facility] 30 minutes prior to test start time. You can use the FREE valet parking offered at entrance C (encouraged to control the heart rate for the test)  Proceed to the [facility] Radiology Department (first floor) to check-in and test prep.  All radiology patients and guests should use entrance C2 at [facility], accessed from [facility], even though the hospital's physical address listed is [address].    If scheduled at [facility], please arrive 15 mins early for check-in and test prep.  Please follow these instructions carefully (unless otherwise directed):  Hold all erectile dysfunction medications at least 3 days (72 hrs) prior to test.  On  the Night Before the Test: Be sure to Drink plenty of water. Do not consume any caffeinated/decaffeinated beverages or chocolate 12 hours prior to your test. Do not take any antihistamines 12 hours prior to your test.   On the Day of the Test: Drink plenty of water until 1 hour prior to the test. Do not  eat any food 4 hours prior to the test. You may take your regular medications prior to the test.  HOLD Hydrochlorothiazide morning of the test.       After the Test: Drink plenty of water. After receiving IV contrast, you may experience a mild flushed feeling. This is normal. On occasion, you may experience a mild rash up to 24 hours after the test. This is not dangerous. If this occurs, you can take Benadryl 25 mg and increase your fluid intake. If you experience trouble breathing, this can be serious. If it is severe call 911 IMMEDIATELY. If it is mild, please call our office. If you take any of these medications: Glipizide/Metformin, Avandament, Glucavance, please do not take 48 hours after completing test unless otherwise instructed.  We will call to schedule your test 2-4 weeks out understanding that some insurance companies will need an authorization prior to the service being performed.   For non-scheduling related questions, please contact the cardiac imaging nurse navigator should you have any questions/concerns: Marchia Bond, Cardiac Imaging Nurse Navigator Gordy Clement, Cardiac Imaging Nurse Navigator [place] [facility] Direct Office Dial: [phone]   For scheduling needs, including cancellations and rescheduling, please call [place], [phone].   Important Information About Sugar

## 2021-12-13 NOTE — Progress Notes (Unsigned)
Office Visit    Patient Name: Jamie [name] Date of Encounter: 12/14/2021  Primary Care Provider:  Lemmie Evens, MD Primary Cardiologist:  Werner Lean, MD Primary Electrophysiologist: None  Chief Complaint    Jamie [name] is a 51 y.o. male with PMH of atypical chest pain, SOB, tobacco abuse, hepatitis C, HTN, who presents today for surgical clearance.  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   Hypertension    "Dr Karie Kirks took me off meds"  diet control   Lumbar radiculopathy    Pre-diabetes    Past Surgical History:  Procedure Laterality Date   BACK SURGERY     CARPAL TUNNEL RELEASE Right 06/25/2013   Procedure: CARPAL TUNNEL RELEASE;  Surgeon: Carole Civil, MD;  Location: [facility];  Service: Orthopedics;  Laterality: Right;   CARPAL TUNNEL RELEASE Left 07/25/2013   Procedure: LEFT CARPAL TUNNEL RELEASE;  Surgeon: Carole Civil, MD;  Location: [facility];  Service: Orthopedics;  Laterality: Left;   COLONOSCOPY WITH PROPOFOL N/A 04/05/2017   Surgeon: Daneil Dolin, MD; normal exam. Recommended 5 year repeat due to family history   ELBOW SURGERY Left    FOOT SURGERY Right    HERNIA REPAIR Right    inguinal- age 13   I & D EXTREMITY Left 06/12/2021   Procedure: IRRIGATION AND DEBRIDEMENT;  Surgeon: Iran Planas, MD;  Location: [place];  Service: Orthopedics;  Laterality: Left;   KNEE ARTHROSCOPY WITH MEDIAL MENISECTOMY Left 12/07/2016   Procedure: KNEE ARTHROSCOPY WITH MEDIAL MENISECTOMY;  Surgeon: Carole Civil, MD;  Location: [facility];  Service: Orthopedics;  Laterality: Left;   KNEE SURGERY     left elbow     LUMBAR LAMINECTOMY/DECOMPRESSION  MICRODISCECTOMY Left 10/06/2013   Procedure: Left Lumbar Three-four microdiskectomy;  Surgeon: Ophelia Charter, MD;  Location: [place];  Service: Neurosurgery;  Laterality: Left;  Left Lumbar Three-four microdiskectomy   OPEN REDUCTION INTERNAL FIXATION (ORIF) HAND Left 06/12/2021   Procedure: OPEN REDUCTION INTERNAL FIXATION (ORIF) HAND;  Surgeon: Iran Planas, MD;  Location: [place];  Service: Orthopedics;  Laterality: Left;   REPAIR OF RUPTURED PATELLA LIGAMENT Left 05/03/2020   Procedure: LEFT KNEE PATELLECTOMY;  Surgeon: Frederik Pear, MD;  Location: [facility];  Service: Orthopedics;  Laterality: Left;   right foot     forgein body removal   right knee  orif right patella Keeling 1993   SEPTOPLASTY     TOTAL KNEE ARTHROPLASTY Left 02/18/2019   Procedure: TOTAL KNEE ARTHROPLASTY;  Surgeon: Carole Civil, MD;  Location: [facility];  Service: Orthopedics;  Laterality: Left;    Allergies  Allergies  Allergen Reactions   Doxepin    Dronabinol    Tramadol    Trazodone     History of Present Illness    Jamie [name] is a 51 year old male with above-mentioned past medical who presents today for surgical clearance.  [name] had [place] performed 2012 for complaint of chest pain that showed no obstructive CAD with EF of 60%.  He was most recently seen by Dr. Gasper Sells and was  having waxing and waning chest pressure.  He describes pressure at rest and with exertion.  Coronary CTA was scheduled for further evaluation along with 2D echo.  Patient was also advised to consider smoking cessation.  [name] presents today for preoperative clearance appointment with his significant other.  Since last being seen in the office patient reports he has been feeling rundown.  His heart rate today was 46 bpm and this is similar to previous heart rate that was 43 during recent visit.  He states that his chest pain has resolved and is no longer causing any discomfort.  He is currently scheduled to  have echo completed this Friday and coronary CTA to be completed on 9/15.  We will hold clearance until after receiving final results of scheduled test.  Patient denies chest pain, palpitations, dyspnea, PND, orthopnea, nausea, vomiting, dizziness, syncope, edema, weight gain, or early satiety.   Home Medications    Current Outpatient Medications  Medication Sig Dispense Refill   citalopram (CELEXA) 20 MG tablet Take 20 mg by mouth daily.     citalopram (CELEXA) 40 MG tablet Take 40 mg by mouth daily.     diazepam (VALIUM) 10 MG tablet Take 10 mg by mouth 2 (two) times daily.      diclofenac Sodium (VOLTAREN) 1 % GEL Apply 1 application topically in the morning, at noon, in the evening, and at bedtime.     gabapentin (NEURONTIN) 100 MG capsule TAKE 1 CAPSULE(100 MG) BY MOUTH THREE TIMES DAILY 60 capsule 5   hydrochlorothiazide (HYDRODIURIL) 25 MG tablet Take 25 mg by mouth daily.      naloxone (NARCAN) nasal spray 4 mg/0.1 mL as needed. overdose     oxyCODONE-acetaminophen (PERCOCET/ROXICET) 5-325 MG tablet Take 1 tablet by mouth every 6 (six) hours as needed for severe pain.     pantoprazole (PROTONIX) 40 MG tablet Take 1 tablet by mouth daily.     No current facility-administered medications for this visit.     Review of Systems  Please see the history of present illness.    (+) Fatigue   All other systems reviewed and are otherwise negative except as noted above.  Physical Exam    Wt Readings from Last 3 Encounters:  12/14/21 207 lb (93.9 kg)  12/08/21 206 lb 3.2 oz (93.5 kg)  11/24/21 208 lb 12.8 oz (94.7 kg)   VS: Vitals:   12/14/21 1547  BP: 110/68  Pulse: (!) 44  SpO2: 98%  ,Body mass index is 28.87 kg/m.  Constitutional:      Appearance: Healthy appearance. Not in distress.  Neck:     Vascular: JVD normal.  Pulmonary:     Effort: Pulmonary effort is normal.     Breath sounds: No wheezing. No rales. Diminished in the bases Cardiovascular:     Normal rate.  Regular rhythm. Normal S1. Normal S2.      Murmurs: There is no murmur.  Edema:    Peripheral edema absent.  Abdominal:     Palpations: Abdomen is soft non tender. There is no hepatomegaly.  Skin:    General: Skin is warm and dry.  Neurological:     General: No focal deficit present.     Mental Status: Alert and oriented to person, place and time.     Cranial Nerves: Cranial nerves are intact.  EKG/LABS/Other Studies Reviewed    ECG personally reviewed by me today -none completed today      Lab Results  Component Value Date  WBC 7.5 04/29/2020   HGB 13.6 06/12/2021   HCT 40.0 06/12/2021   MCV 92.2 04/29/2020   PLT 216 04/29/2020   Lab Results  Component Value Date   CREATININE 1.04 12/08/2021   BUN 12 12/08/2021   NA 139 12/08/2021   K 4.1 12/08/2021   CL 99 12/08/2021   CO2 28 12/08/2021   Lab Results  Component Value Date   ALT 15 04/29/2020   AST 18 04/29/2020   ALKPHOS 35 (L) 04/29/2020   BILITOT 0.8 04/29/2020   Lab Results  Component Value Date   CHOL  06/10/2010    131        ATP III CLASSIFICATION:  <200     mg/dL   Desirable  200-239  mg/dL   Borderline High  >=240    mg/dL   High          HDL 39 (L) 06/10/2010   LDLCALC  06/10/2010    79        Total Cholesterol/HDL:CHD Risk Coronary Heart Disease Risk Table                     Men   Women  1/2 Average Risk   3.4   3.3  Average Risk       5.0   4.4  2 X Average Risk   9.6   7.1  3 X Average Risk  23.4   11.0        Use the calculated Patient Ratio above and the CHD Risk Table to determine the patient's CHD Risk.        ATP III CLASSIFICATION (LDL):  <100     mg/dL   Optimal  100-129  mg/dL   Near or Above                    Optimal  130-159  mg/dL   Borderline  160-189  mg/dL   High  >190     mg/dL   Very High   TRIG 67 06/10/2010   CHOLHDL 3.4 06/10/2010    Lab Results  Component Value Date   HGBA1C 5.0 04/29/2020    Assessment & Plan    1.  Preoperative  clearance: -Patient advised that clearance will be provided pending echo and cardiac CT results [id number]} Mr. Evinger perioperative risk of a major cardiac event is 0.4% according to the Revised Cardiac Risk Index (RCRI).  Therefore, he is at low risk for perioperative complications.   His functional capacity is good at 4.64 METs according to the Duke Activity Status Index (DASI). Recommendations: According to ACC/AHA guidelines, no further cardiovascular testing needed.  The patient may proceed to surgery at acceptable risk.   Antiplatelet and/or Anticoagulation Recommendations: Not applicable  2.  Hypertension: -Blood pressure today was was well controlled at 110/68 -We will have him hold clonidine due to low heart rate and blood pressures. -Please continue HCTZ 25 mg daily  3.  Tobacco abuse: -Today patient is working on cessation  4.  Precordial pain: -Patient states that precordial pain has subsided and he is no longer experiencing. -Cardiac CTA scheduled for evaluation    Disposition: Follow-up with Werner Lean, MD as scheduled    Medication Adjustments/Labs and Tests Ordered: Current medicines are reviewed at length with the patient today.  Concerns regarding medicines are outlined above.   Signed, Mable Fill, Marissa Nestle, NP 12/14/2021, 4:29 PM [place]

## 2021-12-14 ENCOUNTER — Ambulatory Visit: Payer: Medicaid Other | Attending: Nurse Practitioner | Admitting: Nurse Practitioner

## 2021-12-14 ENCOUNTER — Encounter: Payer: Self-pay | Admitting: Nurse Practitioner

## 2021-12-14 VITALS — BP 110/68 | HR 44 | Ht 71.0 in | Wt 207.0 lb

## 2021-12-14 DIAGNOSIS — I10 Essential (primary) hypertension: Secondary | ICD-10-CM | POA: Diagnosis not present

## 2021-12-14 DIAGNOSIS — R072 Precordial pain: Secondary | ICD-10-CM

## 2021-12-14 DIAGNOSIS — Z01810 Encounter for preprocedural cardiovascular examination: Secondary | ICD-10-CM | POA: Diagnosis not present

## 2021-12-14 DIAGNOSIS — Z720 Tobacco use: Secondary | ICD-10-CM | POA: Diagnosis not present

## 2021-12-14 NOTE — Patient Instructions (Signed)
Medication Instructions:  Your physician has recommended you make the following change in your medication:   Stop taking Clonidine.  *If you need a refill on your cardiac medications before your next appointment, please call your pharmacy*  Follow-Up: At [facility], you and your health needs are our priority.  As part of our continuing mission to provide you with exceptional heart care, we have created designated Provider Care Teams.  These Care Teams include your primary Cardiologist (physician) and Advanced Practice Providers (APPs -  Physician Assistants and Nurse Practitioners) who all work together to provide you with the care you need, when you need it.  We recommend signing up for the patient portal called "MyChart".  Sign up information is provided on this After Visit Summary.  MyChart is used to connect with patients for Virtual Visits (Telemedicine).  Patients are able to view lab/test results, encounter notes, upcoming appointments, etc.  Non-urgent messages can be sent to your provider as well.   To learn more about what you can do with MyChart, go to [email or web address].    Your next appointment:   December 4 @ 8:40 am  The format for your next appointment:   In Person  Provider:   Werner Lean, MD

## 2021-12-16 ENCOUNTER — Ambulatory Visit (HOSPITAL_COMMUNITY)
Admission: RE | Admit: 2021-12-16 | Discharge: 2021-12-16 | Disposition: A | Discharge status: Home / Self Care | Payer: Medicaid Other | Source: Ambulatory Visit | Attending: Internal Medicine | Admitting: Internal Medicine

## 2021-12-16 DIAGNOSIS — R072 Precordial pain: Secondary | ICD-10-CM | POA: Insufficient documentation

## 2021-12-16 LAB — ECHOCARDIOGRAM COMPLETE
Area-P 1/2: 2.29 cm2
S' Lateral: 2.2 cm

## 2021-12-16 NOTE — Progress Notes (Signed)
*  PRELIMINARY RESULTS* Echocardiogram 2D Echocardiogram has been performed.  Jamie [name] 12/16/2021, 10:12 AM

## 2021-12-19 DIAGNOSIS — F411 Generalized anxiety disorder: Secondary | ICD-10-CM | POA: Diagnosis not present

## 2021-12-19 DIAGNOSIS — M25561 Pain in right knee: Secondary | ICD-10-CM | POA: Diagnosis not present

## 2021-12-19 DIAGNOSIS — Z79899 Other long term (current) drug therapy: Secondary | ICD-10-CM | POA: Diagnosis not present

## 2021-12-19 DIAGNOSIS — M545 Low back pain, unspecified: Secondary | ICD-10-CM | POA: Diagnosis not present

## 2021-12-19 DIAGNOSIS — G8929 Other chronic pain: Secondary | ICD-10-CM | POA: Diagnosis not present

## 2021-12-19 DIAGNOSIS — Z6827 Body mass index (BMI) 27.0-27.9, adult: Secondary | ICD-10-CM | POA: Diagnosis not present

## 2021-12-19 DIAGNOSIS — M25562 Pain in left knee: Secondary | ICD-10-CM | POA: Diagnosis not present

## 2021-12-21 DIAGNOSIS — Z79899 Other long term (current) drug therapy: Secondary | ICD-10-CM | POA: Diagnosis not present

## 2021-12-22 ENCOUNTER — Telehealth (HOSPITAL_COMMUNITY): Payer: Self-pay | Admitting: *Deleted

## 2021-12-22 NOTE — Telephone Encounter (Signed)
Reaching out to patient to offer assistance regarding upcoming cardiac imaging study; pt verbalizes understanding of appt date/time, parking situation and where to check in, and verified current allergies; name and call back number provided for further questions should they arise  Gordy Clement RN [facility] [phone] office [phone] cell  Patient aware to arrive at 3:30pm.

## 2021-12-23 ENCOUNTER — Ambulatory Visit (HOSPITAL_COMMUNITY)
Admission: RE | Admit: 2021-12-23 | Discharge: 2021-12-23 | Disposition: A | Discharge status: Home / Self Care | Payer: Medicaid Other | Source: Ambulatory Visit | Attending: Internal Medicine | Admitting: Internal Medicine

## 2021-12-23 DIAGNOSIS — R072 Precordial pain: Secondary | ICD-10-CM

## 2021-12-23 MED ORDER — IOHEXOL 350 MG/ML SOLN
100.0000 mL | Freq: Once | INTRAVENOUS | Status: AC | PRN
  Administered 2021-12-23: 100 mL via INTRAVENOUS

## 2021-12-23 MED ORDER — NITROGLYCERIN 0.4 MG SL SUBL
0.8000 mg | SUBLINGUAL_TABLET | Freq: Once | SUBLINGUAL | Status: AC
  Administered 2021-12-23: 0.8 mg via SUBLINGUAL

## 2021-12-26 ENCOUNTER — Telehealth: Payer: Self-pay | Admitting: Gastroenterology

## 2021-12-26 DIAGNOSIS — R194 Change in bowel habit: Secondary | ICD-10-CM

## 2021-12-26 DIAGNOSIS — R634 Abnormal weight loss: Secondary | ICD-10-CM

## 2021-12-26 DIAGNOSIS — R1030 Lower abdominal pain, unspecified: Secondary | ICD-10-CM

## 2021-12-26 NOTE — Telephone Encounter (Signed)
Patient has been cleared by cardiology to proceed with EGD and colonoscopy.  Echocardiogram with no significant abnormalities.  Coronary CT with no significant findings.  They recommended starting aspirin after colonoscopy.  Mindy/Tammy: Please arrange EGD and colonoscopy with Dr. Gala Romney. ASA 2 Dx: Change in bowel habits, unintentional weight loss, lack of appetite.

## 2021-12-26 NOTE — Telephone Encounter (Signed)
Will call pt to schedule once we receive future schedule 

## 2021-12-28 ENCOUNTER — Telehealth: Payer: Self-pay

## 2021-12-28 MED ORDER — ASPIRIN 81 MG PO TBEC: 81.0000 mg | DELAYED_RELEASE_TABLET | Freq: Every day | ORAL | 3 refills | Status: DC

## 2021-12-28 NOTE — Telephone Encounter (Signed)
-----   Message from Werner Lean, MD sent at 12/26/2021  9:23 AM EDT ----- Results: Minimal non obstructive CAD Plan: ASA 81 mg start post colonoscopy Smoking cessation LDL goal < 70  Werner Lean, MD

## 2021-12-28 NOTE — Telephone Encounter (Signed)
The patient has been notified of the result and verbalized understanding.  All questions (if any) were answered. Precious Gilding, RN 12/28/2021 2:19 PM

## 2021-12-30 DIAGNOSIS — F419 Anxiety disorder, unspecified: Secondary | ICD-10-CM | POA: Diagnosis not present

## 2021-12-30 DIAGNOSIS — R109 Unspecified abdominal pain: Secondary | ICD-10-CM | POA: Diagnosis not present

## 2021-12-30 DIAGNOSIS — Z79891 Long term (current) use of opiate analgesic: Secondary | ICD-10-CM | POA: Diagnosis not present

## 2021-12-30 DIAGNOSIS — Z23 Encounter for immunization: Secondary | ICD-10-CM | POA: Diagnosis not present

## 2021-12-30 DIAGNOSIS — M5116 Intervertebral disc disorders with radiculopathy, lumbar region: Secondary | ICD-10-CM | POA: Diagnosis not present

## 2022-01-11 MED ORDER — CLENPIQ 10-3.5-12 MG-GM -GM/175ML PO SOLN: 1.0000 | ORAL | 0 refills | Status: DC

## 2022-01-11 NOTE — Telephone Encounter (Signed)
Spoke with pt. Scheduled for 11/6 at 9:15am. Aware will mail instructions. Rx for prep sent in. Pt on diuretic and needs BMET

## 2022-01-11 NOTE — Addendum Note (Signed)
Addended by: Cheron Every on: 01/11/2022 02:49 PM   Modules accepted: Orders

## 2022-01-18 DIAGNOSIS — I10 Essential (primary) hypertension: Secondary | ICD-10-CM | POA: Diagnosis not present

## 2022-01-18 DIAGNOSIS — M25562 Pain in left knee: Secondary | ICD-10-CM | POA: Diagnosis not present

## 2022-01-18 DIAGNOSIS — G8929 Other chronic pain: Secondary | ICD-10-CM | POA: Diagnosis not present

## 2022-01-18 DIAGNOSIS — M25561 Pain in right knee: Secondary | ICD-10-CM | POA: Diagnosis not present

## 2022-01-18 DIAGNOSIS — E7800 Pure hypercholesterolemia, unspecified: Secondary | ICD-10-CM | POA: Diagnosis not present

## 2022-01-18 DIAGNOSIS — F411 Generalized anxiety disorder: Secondary | ICD-10-CM | POA: Diagnosis not present

## 2022-01-18 DIAGNOSIS — Z125 Encounter for screening for malignant neoplasm of prostate: Secondary | ICD-10-CM | POA: Diagnosis not present

## 2022-01-18 DIAGNOSIS — M545 Low back pain, unspecified: Secondary | ICD-10-CM | POA: Diagnosis not present

## 2022-01-18 DIAGNOSIS — Z79899 Other long term (current) drug therapy: Secondary | ICD-10-CM | POA: Diagnosis not present

## 2022-01-18 DIAGNOSIS — Z6827 Body mass index (BMI) 27.0-27.9, adult: Secondary | ICD-10-CM | POA: Diagnosis not present

## 2022-01-18 DIAGNOSIS — R0602 Shortness of breath: Secondary | ICD-10-CM | POA: Diagnosis not present

## 2022-01-20 DIAGNOSIS — Z79899 Other long term (current) drug therapy: Secondary | ICD-10-CM | POA: Diagnosis not present

## 2022-02-06 DIAGNOSIS — B1920 Unspecified viral hepatitis C without hepatic coma: Secondary | ICD-10-CM | POA: Diagnosis not present

## 2022-02-06 DIAGNOSIS — K7400 Hepatic fibrosis, unspecified: Secondary | ICD-10-CM | POA: Diagnosis not present

## 2022-02-06 NOTE — Telephone Encounter (Signed)
Authorization #: [id number], DOS: Feb 13, 2022 - May 14, 2022

## 2022-02-07 ENCOUNTER — Ambulatory Visit: Payer: Medicaid Other | Admitting: Cardiology

## 2022-02-07 LAB — HEPATITIS A ANTIBODY, TOTAL: Hepatitis A AB,Total: NONREACTIVE

## 2022-02-07 LAB — HEPATITIS B SURFACE ANTIBODY,QUALITATIVE: Hep B S Ab: NONREACTIVE

## 2022-02-07 LAB — PROTIME-INR
INR: 1
Prothrombin Time: 10.7 s (ref 9.0–11.5)

## 2022-02-13 ENCOUNTER — Ambulatory Visit (HOSPITAL_COMMUNITY)
Admission: RE | Admit: 2022-02-13 | Discharge: 2022-02-13 | Disposition: A | Discharge status: Home / Self Care | Payer: Medicaid Other | Source: Ambulatory Visit | Attending: Internal Medicine | Admitting: Internal Medicine

## 2022-02-13 ENCOUNTER — Ambulatory Visit (HOSPITAL_COMMUNITY): Payer: Medicaid Other | Admitting: Anesthesiology

## 2022-02-13 ENCOUNTER — Encounter (HOSPITAL_COMMUNITY)
Admission: RE | Disposition: A | Discharge status: Home / Self Care | Payer: Self-pay | Source: Ambulatory Visit | Attending: Internal Medicine

## 2022-02-13 ENCOUNTER — Other Ambulatory Visit: Payer: Self-pay

## 2022-02-13 ENCOUNTER — Ambulatory Visit (HOSPITAL_BASED_OUTPATIENT_CLINIC_OR_DEPARTMENT_OTHER): Payer: Medicaid Other | Admitting: Anesthesiology

## 2022-02-13 ENCOUNTER — Encounter (HOSPITAL_COMMUNITY): Payer: Self-pay | Admitting: Internal Medicine

## 2022-02-13 DIAGNOSIS — R634 Abnormal weight loss: Secondary | ICD-10-CM

## 2022-02-13 DIAGNOSIS — K219 Gastro-esophageal reflux disease without esophagitis: Secondary | ICD-10-CM | POA: Diagnosis not present

## 2022-02-13 DIAGNOSIS — R194 Change in bowel habit: Secondary | ICD-10-CM | POA: Insufficient documentation

## 2022-02-13 DIAGNOSIS — Z800 Family history of malignant neoplasm of digestive organs: Secondary | ICD-10-CM | POA: Insufficient documentation

## 2022-02-13 DIAGNOSIS — K640 First degree hemorrhoids: Secondary | ICD-10-CM | POA: Diagnosis not present

## 2022-02-13 DIAGNOSIS — F17210 Nicotine dependence, cigarettes, uncomplicated: Secondary | ICD-10-CM | POA: Diagnosis not present

## 2022-02-13 DIAGNOSIS — J45909 Unspecified asthma, uncomplicated: Secondary | ICD-10-CM | POA: Diagnosis not present

## 2022-02-13 DIAGNOSIS — R0789 Other chest pain: Secondary | ICD-10-CM | POA: Insufficient documentation

## 2022-02-13 DIAGNOSIS — M1990 Unspecified osteoarthritis, unspecified site: Secondary | ICD-10-CM | POA: Insufficient documentation

## 2022-02-13 DIAGNOSIS — F419 Anxiety disorder, unspecified: Secondary | ICD-10-CM | POA: Insufficient documentation

## 2022-02-13 DIAGNOSIS — Z6829 Body mass index (BMI) 29.0-29.9, adult: Secondary | ICD-10-CM | POA: Diagnosis not present

## 2022-02-13 DIAGNOSIS — D120 Benign neoplasm of cecum: Secondary | ICD-10-CM

## 2022-02-13 DIAGNOSIS — Z79899 Other long term (current) drug therapy: Secondary | ICD-10-CM | POA: Diagnosis not present

## 2022-02-13 DIAGNOSIS — I10 Essential (primary) hypertension: Secondary | ICD-10-CM | POA: Insufficient documentation

## 2022-02-13 DIAGNOSIS — Z8619 Personal history of other infectious and parasitic diseases: Secondary | ICD-10-CM | POA: Insufficient documentation

## 2022-02-13 DIAGNOSIS — R079 Chest pain, unspecified: Secondary | ICD-10-CM | POA: Diagnosis present

## 2022-02-13 DIAGNOSIS — G709 Myoneural disorder, unspecified: Secondary | ICD-10-CM | POA: Insufficient documentation

## 2022-02-13 DIAGNOSIS — K635 Polyp of colon: Secondary | ICD-10-CM | POA: Diagnosis not present

## 2022-02-13 HISTORY — PX: COLONOSCOPY WITH PROPOFOL: SHX5780

## 2022-02-13 HISTORY — PX: ESOPHAGOGASTRODUODENOSCOPY (EGD) WITH PROPOFOL: SHX5813

## 2022-02-13 HISTORY — PX: POLYPECTOMY: SHX5525

## 2022-02-13 LAB — BASIC METABOLIC PANEL
Anion gap: 8 (ref 5–15)
BUN: 17 mg/dL (ref 6–20)
CO2: 30 mmol/L (ref 22–32)
Calcium: 9.2 mg/dL (ref 8.9–10.3)
Chloride: 100 mmol/L (ref 98–111)
Creatinine, Ser: 0.91 mg/dL (ref 0.61–1.24)
GFR, Estimated: >60 mL/min (ref >60)
Glucose, Bld: 93 mg/dL (ref 70–99)
Potassium: 2.9 mmol/L — ABNORMAL LOW (ref 3.5–5.1)
Sodium: 138 mmol/L (ref 135–145)

## 2022-02-13 SURGERY — COLONOSCOPY WITH PROPOFOL
Anesthesia: Monitor Anesthesia Care

## 2022-02-13 MED ORDER — LACTATED RINGERS IV SOLN: INTRAVENOUS | Status: DC

## 2022-02-13 MED ORDER — PROPOFOL 10 MG/ML IV BOLUS
INTRAVENOUS | Status: DC | PRN
  Administered 2022-02-13: 200 mg via INTRAVENOUS

## 2022-02-13 MED ORDER — PROPOFOL 500 MG/50ML IV EMUL
INTRAVENOUS | Status: DC | PRN
  Administered 2022-02-13: 200 ug/kg/min via INTRAVENOUS

## 2022-02-13 MED ORDER — PROPOFOL 10 MG/ML IV BOLUS
INTRAVENOUS | Status: AC
  Filled 2022-02-13: qty 20

## 2022-02-13 MED ORDER — LIDOCAINE HCL (CARDIAC) PF 100 MG/5ML IV SOSY
PREFILLED_SYRINGE | INTRAVENOUS | Status: DC | PRN
  Administered 2022-02-13: 60 mg via INTRATRACHEAL

## 2022-02-13 MED ORDER — GLUCAGON HCL RDNA (DIAGNOSTIC) 1 MG IJ SOLR
INTRAMUSCULAR | Status: AC
  Filled 2022-02-13: qty 1

## 2022-02-13 MED ORDER — GLUCAGON HCL RDNA (DIAGNOSTIC) 1 MG IJ SOLR
INTRAMUSCULAR | Status: DC | PRN
  Administered 2022-02-13: 1 mg via INTRAVENOUS

## 2022-02-13 MED ORDER — STERILE WATER FOR IRRIGATION IR SOLN
Status: DC | PRN
  Administered 2022-02-13: 100 mL

## 2022-02-13 MED ORDER — POTASSIUM CHLORIDE CRYS ER 20 MEQ PO TBCR
40.0000 meq | EXTENDED_RELEASE_TABLET | Freq: Once | ORAL | Status: AC
  Administered 2022-02-13: 40 meq via ORAL
  Filled 2022-02-13: qty 2

## 2022-02-13 MED ORDER — POTASSIUM CHLORIDE 10 MEQ/100ML IV SOLN
10.0000 meq | INTRAVENOUS | Status: AC
  Administered 2022-02-13 (×2): 10 meq via INTRAVENOUS
  Filled 2022-02-13: qty 100

## 2022-02-13 MED ORDER — LACTATED RINGERS IV SOLN: INTRAVENOUS | Status: DC | PRN

## 2022-02-13 NOTE — H&P (Signed)
@LOGO@   Primary Care Physician:  Knowlton, Steve, MD Primary Gastroenterologist:  Dr.   Pre-Procedure History & Physical: HPI:  Jamie [name] is a 51 y.o. male here for  further evaluation of GERD, chest pain weight loss change in bowel habits via EGD and colonoscopy.  Cardiac work-up negative recently.    Right quadrant ultrasound elastography K PA 4.2.  Normal-appearing gallbladder.    Patient sleeping poorly eating poorly.  Taking care of his mother 24/7 with pancreatic cancer.  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   Hypertension    "Dr Knowlton took me off meds"  diet control   Lumbar radiculopathy    Pre-diabetes     Past Surgical History:  Procedure Laterality Date   BACK SURGERY     CARPAL TUNNEL RELEASE Right 06/25/2013   Procedure: CARPAL TUNNEL RELEASE;  Surgeon: Stanley E Harrison, MD;  Location: [facility];  Service: Orthopedics;  Laterality: Right;   CARPAL TUNNEL RELEASE Left 07/25/2013   Procedure: LEFT CARPAL TUNNEL RELEASE;  Surgeon: Stanley E Harrison, MD;  Location: [facility];  Service: Orthopedics;  Laterality: Left;   COLONOSCOPY WITH PROPOFOL N/A 04/05/2017   Surgeon: Rourk, Robert M, MD; normal exam. Recommended 5 year repeat due to family history   ELBOW SURGERY Left    FOOT SURGERY Right    HERNIA REPAIR Right    inguinal- age 3   I & D EXTREMITY Left 06/12/2021   Procedure: IRRIGATION AND DEBRIDEMENT;  Surgeon: Ortmann, Fred, MD;  Location: [facility];  Service: Orthopedics;  Laterality: Left;   KNEE ARTHROSCOPY WITH MEDIAL MENISECTOMY Left 12/07/2016   Procedure: KNEE ARTHROSCOPY WITH MEDIAL MENISECTOMY;  Surgeon: Harrison, Stanley E, MD;  Location: [facility];  Service: Orthopedics;   Laterality: Left;   KNEE SURGERY     left elbow     LUMBAR LAMINECTOMY/DECOMPRESSION MICRODISCECTOMY Left 10/06/2013   Procedure: Left Lumbar Three-four microdiskectomy;  Surgeon: Jeffrey D Jenkins, MD;  Location: [facility];  Service: Neurosurgery;  Laterality: Left;  Left Lumbar Three-four microdiskectomy   OPEN REDUCTION INTERNAL FIXATION (ORIF) HAND Left 06/12/2021   Procedure: OPEN REDUCTION INTERNAL FIXATION (ORIF) HAND;  Surgeon: Ortmann, Fred, MD;  Location: [facility];  Service: Orthopedics;  Laterality: Left;   REPAIR OF RUPTURED PATELLA LIGAMENT Left 05/03/2020   Procedure: LEFT KNEE PATELLECTOMY;  Surgeon: Rowan, Frank, MD;  Location: [facility];  Service: Orthopedics;  Laterality: Left;   right foot     forgein body removal   right knee  orif right patella Keeling 1993   SEPTOPLASTY     TOTAL KNEE ARTHROPLASTY Left 02/18/2019   Procedure: TOTAL KNEE ARTHROPLASTY;  Surgeon: Harrison, Stanley E, MD;  Location: [facility];  Service: Orthopedics;  Laterality: Left;    Prior to Admission medications   Medication Sig Start Date End Date Taking? Authorizing Provider  aspirin EC 81 MG tablet Take 1 tablet (81 mg total) by mouth daily. Swallow whole. 12/28/21  Yes Chandrasekhar, Mahesh A, MD  citalopram (CELEXA) 20 MG tablet Take 20 mg by mouth daily. 09/09/21  Yes [provider]  citalopram (CELEXA) 40 MG tablet Take 40 mg by   mouth daily. 04/20/21  Yes [provider]  diazepam (VALIUM) 10 MG tablet Take 10 mg by mouth 2 (two) times daily.  05/19/18  Yes [provider]  diclofenac Sodium (VOLTAREN) 1 % GEL Apply 1 application topically in the morning, at noon, in the evening, and at bedtime. 05/20/21  Yes [provider]  gabapentin (NEURONTIN) 100 MG capsule TAKE 1 CAPSULE(100 MG) BY MOUTH THREE TIMES DAILY 03/28/21  Yes Harrison, Stanley E, MD  hydrochlorothiazide (HYDRODIURIL) 25 MG tablet Take 25 mg by mouth daily.  06/05/18  Yes [provider]   oxyCODONE-acetaminophen (PERCOCET/ROXICET) 5-325 MG tablet Take 1 tablet by mouth every 6 (six) hours as needed for severe pain.   Yes [provider]  pantoprazole (PROTONIX) 40 MG tablet Take 1 tablet by mouth daily. 05/09/18  Yes [provider]  Sod Picosulfate-Mag Ox-Cit Acd (CLENPIQ) 10-3.5-12 MG-GM -GM/175ML SOLN Take 1 kit by mouth as directed. 01/11/22  Yes Rourk, Robert M, MD  naloxone (NARCAN) nasal spray 4 mg/0.1 mL as needed. overdose 10/19/21   [provider]    Allergies as of 01/11/2022 - Review Complete 12/23/2021  Allergen Reaction Noted   Doxepin  12/01/2021   Dronabinol  12/01/2021   Tramadol  12/01/2021   Trazodone  12/01/2021    Family History  Problem Relation Age of Onset   Heart disease Other    Arthritis Other    Cancer Other    Asthma Other    Diabetes Other    Kidney disease Other    Colon cancer Father 50    Social History   Socioeconomic History   Marital status: Divorced    Spouse name: is seperated   Number of children: Not on file   Years of education: 8th grade    Highest education level: Not on file  Occupational History   Occupation: unemployed    Employer: unemployed  Tobacco Use   Smoking status: Some Days    Packs/day: 1.00    Years: 15.00    Total pack years: 15.00    Types: Cigarettes   Smokeless tobacco: Former    Types: Chew    Quit date: 12/05/1990   Tobacco comments:    one pack a week  Vaping Use   Vaping Use: Never used  Substance and Sexual Activity   Alcohol use: No   Drug use: Yes    Frequency: 2.0 times per week    Types: Marijuana    Comment: 3 to 4 days ago last use   Sexual activity: Never    Birth control/protection: None  Other Topics Concern   Not on file  Social History Narrative   Not on file   Social Determinants of Health   Financial Resource Strain: Not on file  Food Insecurity: Not on file  Transportation Needs: Not on file  Physical Activity: Not on file   Stress: Not on file  Social Connections: Not on file  Intimate Partner Violence: Not on file    Review of Systems: See HPI, otherwise negative ROS  Physical Exam: BP 136/82   Pulse (!) 49   Temp 98.3 F (36.8 C) (Oral)   Resp 13   Ht 5' 11" (1.803 m)   Wt 94.8 kg   SpO2 95%   BMI 29.15 kg/m  General:   Alert,  Well-developed, well-nourished, pleasant and cooperative in NAD Neck:  Supple; no masses or thyromegaly. No significant cervical adenopathy. Lungs:  Clear throughout to auscultation.   No wheezes,   crackles, or rhonchi. No acute distress. Heart:  Regular rate and rhythm; no murmurs, clicks, rubs,  or gallops. Abdomen: Non-distended, normal bowel sounds.  Soft and nontender without appreciable mass or hepatosplenomegaly.  Pulses:  Normal pulses noted. Extremities:  Without clubbing or edema.  Impression/Plan:    51 year old gentleman with chest pain anorexia weight loss nonspecific change in bowel habits.  Cardiac work-up negative.  Under quite a bit of stress taking care of his mother 24/7 with pancreatic cancer.  No dysphagia.    Positive family history colon cancer.  Both an EGD and colonoscopy today to further evaluate  The risks, benefits, limitations, imponderables and alternatives regarding both EGD and colonoscopy have been reviewed with the patient. Questions have been answered. All parties agreeable.       Notice: This dictation was prepared with Dragon dictation along with smaller phrase technology. Any transcriptional errors that result from this process are unintentional and may not be corrected upon review.

## 2022-02-13 NOTE — Op Note (Signed)
[facility] Patient Name: Jamie [name] Procedure Date: 02/13/2022 12:23 PM MRN: [id number] Date of Birth: [birth year] Attending MD: Norvel Richards , MD, [id number] CSN: [id number] Age: 51 Admit Type: Outpatient Procedure:                Upper GI endoscopy Indications:              Chest pain (non cardiac), Weight loss Providers:                Norvel Richards, MD, Caprice Kluver, Aram Candela Referring MD:              Medicines:                Propofol per Anesthesia Complications:            No immediate complications. Estimated Blood Loss:     Estimated blood loss: none. Procedure:                Pre-Anesthesia Assessment:                           - Prior to the procedure, a History and Physical                            was performed, and patient medications and                            allergies were reviewed. The patient's tolerance of                            previous anesthesia was also reviewed. The risks                            and benefits of the procedure and the sedation                            options and risks were discussed with the patient.                            All questions were answered, and informed consent                            was obtained. Prior Anticoagulants: The patient has                            taken no anticoagulant or antiplatelet agents. ASA                            Grade Assessment: II - A patient with mild systemic                            disease. After reviewing the risks and benefits,                            the patient was deemed in satisfactory condition to  undergo the procedure.                           After obtaining informed consent, the endoscope was                            passed under direct vision. Throughout the                            procedure, the patient's blood pressure, pulse, and                            oxygen saturations were monitored continuously.  The                            [id number]-[id number] ([id number]) scope was introduced through the                            mouth, and advanced to the second part of duodenum.                            The upper GI endoscopy was accomplished without                            difficulty. The patient tolerated the procedure                            well. Scope In: 12:35:09 PM Scope Out: 12:38:32 PM Total Procedure Duration: 0 hours 3 minutes 23 seconds  Findings:      The examined esophagus was normal.      The entire examined stomach was normal.      The duodenal bulb and second portion of the duodenum were normal. Impression:               - Normal esophagus.                           - Normal stomach.                           - Normal duodenal bulb and second portion of the                            duodenum.                           - No specimens collected. Moderate Sedation:      Moderate (conscious) sedation was personally administered by an       anesthesia professional. The following parameters were monitored: oxygen       saturation, heart rate, blood pressure, respiratory rate, EKG, adequacy       of pulmonary ventilation, and response to care. Recommendation:           - Patient has a contact number available for  emergencies. The signs and symptoms of potential                            delayed complications were discussed with the                            patient. Return to normal activities tomorrow.                            Written discharge instructions were provided to the                            patient.                           - Advance diet as tolerated.                           - Continue present medications.                           - Return to my office in 6 weeks. See colonoscopy                            report. Procedure Code(s):        --- Professional ---                           [phone], Esophagogastroduodenoscopy, flexible,                             transoral; diagnostic, including collection of                            specimen(s) by brushing or washing, when performed                            (separate procedure) Diagnosis Code(s):        --- Professional ---                           R07.89, Other chest pain                           R63.4, Abnormal weight loss CPT copyright 2022 American Medical Association. All rights reserved. The codes documented in this report are preliminary and upon coder review may  be revised to meet current compliance requirements. [name]. Nekesha Font, MD Norvel Richards, MD 02/13/2022 12:41:41 PM This report has been signed electronically. Number of Addenda: 0

## 2022-02-13 NOTE — Discharge Instructions (Addendum)
Colonoscopy Discharge Instructions  Read the instructions outlined below and refer to this sheet in the next few weeks. These discharge instructions provide you with general information on caring for yourself after you leave the hospital. Your doctor may also give you specific instructions. While your treatment has been planned according to the most current medical practices available, unavoidable complications occasionally occur. If you have any problems or questions after discharge, call Dr. Gala Romney at [phone]. ACTIVITY You may resume your regular activity, but move at a slower pace for the next 24 hours.  Take frequent rest periods for the next 24 hours.  Walking will help get rid of the air and reduce the bloated feeling in your belly (abdomen).  No driving for 24 hours (because of the medicine (anesthesia) used during the test).   Do not sign any important legal documents or operate any machinery for 24 hours (because of the anesthesia used during the test).  NUTRITION Drink plenty of fluids.  You may resume your normal diet as instructed by your doctor.  Begin with a light meal and progress to your normal diet. Heavy or fried foods are harder to digest and may make you feel sick to your stomach (nauseated).  Avoid alcoholic beverages for 24 hours or as instructed.  MEDICATIONS You may resume your normal medications unless your doctor tells you otherwise.  WHAT YOU CAN EXPECT TODAY Some feelings of bloating in the abdomen.  Passage of more gas than usual.  Spotting of blood in your stool or on the toilet paper.  IF YOU HAD POLYPS REMOVED DURING THE COLONOSCOPY: No aspirin products for 7 days or as instructed.  No alcohol for 7 days or as instructed.  Eat a soft diet for the next 24 hours.  FINDING OUT THE RESULTS OF YOUR TEST Not all test results are available during your visit. If your test results are not back during the visit, make an appointment with your caregiver to find out the  results. Do not assume everything is normal if you have not heard from your caregiver or the medical facility. It is important for you to follow up on all of your test results.  SEEK IMMEDIATE MEDICAL ATTENTION IF: You have more than a spotting of blood in your stool.  Your belly is swollen (abdominal distention).  You are nauseated or vomiting.  You have a temperature over 101.  You have abdominal pain or discomfort that is severe or gets worse throughout the day.   EGD Discharge instructions Please read the instructions outlined below and refer to this sheet in the next few weeks. These discharge instructions provide you with general information on caring for yourself after you leave the hospital. Your doctor may also give you specific instructions. While your treatment has been planned according to the most current medical practices available, unavoidable complications occasionally occur. If you have any problems or questions after discharge, please call your doctor. ACTIVITY You may resume your regular activity but move at a slower pace for the next 24 hours.  Take frequent rest periods for the next 24 hours.  Walking will help expel (get rid of) the air and reduce the bloated feeling in your abdomen.  No driving for 24 hours (because of the anesthesia (medicine) used during the test).  You may shower.  Do not sign any important legal documents or operate any machinery for 24 hours (because of the anesthesia used during the test).  NUTRITION Drink plenty of fluids.  You may  resume your normal diet.  Begin with a light meal and progress to your normal diet.  Avoid alcoholic beverages for 24 hours or as instructed by your caregiver.  MEDICATIONS You may resume your normal medications unless your caregiver tells you otherwise.  WHAT YOU CAN EXPECT TODAY You may experience abdominal discomfort such as a feeling of fullness or "gas" pains.  FOLLOW-UP Your doctor will discuss the results of  your test with you.  SEEK IMMEDIATE MEDICAL ATTENTION IF ANY OF THE FOLLOWING OCCUR: Excessive nausea (feeling sick to your stomach) and/or vomiting.  Severe abdominal pain and distention (swelling).  Trouble swallowing.  Temperature over 101 F (37.8 C).  Rectal bleeding or vomiting of blood.        Your upper GI tract appeared normal at EGD    1 Polyp removed from your colon.   further recommendations to follow pending review of pathology report   office visit with Aliene Altes in 3 months  MESSAGE LEFT AT [facility]   at patient request, I called Towanda Malkin at [phone] -  discussed findings and recommendations

## 2022-02-13 NOTE — Anesthesia Preprocedure Evaluation (Addendum)
Anesthesia Evaluation  Patient identified by MRN, date of birth, ID band Patient awake    Reviewed: Allergy & Precautions, H&P , NPO status , Patient's Chart, lab work & pertinent test results  Airway Mallampati: II  TM Distance: >3 FB Neck ROM: Full    Dental  (+) Dental Advisory Given, Edentulous Upper, Missing   Pulmonary asthma (??on inhaler) , Current Smoker and Patient abstained from smoking.   Pulmonary exam normal breath sounds clear to auscultation       Cardiovascular Exercise Tolerance: Good hypertension, Pt. on medications + DOE  Normal cardiovascular exam Rhythm:Regular Rate:Bradycardia     Neuro/Psych  PSYCHIATRIC DISORDERS Anxiety      Neuromuscular disease    GI/Hepatic ,GERD  Medicated and Controlled,,(+) Hepatitis -, C  Endo/Other  negative endocrine ROS    Renal/GU negative Renal ROS  negative genitourinary   Musculoskeletal  (+) Arthritis , Osteoarthritis,    Abdominal   Peds negative pediatric ROS (+)  Hematology negative hematology ROS (+)   Anesthesia Other Findings   Reproductive/Obstetrics negative OB ROS                             Anesthesia Physical Anesthesia Plan  ASA: 2  Anesthesia Plan:    Post-op Pain Management: Minimal or no pain anticipated   Induction: Intravenous  PONV Risk Score and Plan: Propofol infusion  Airway Management Planned: Nasal Cannula and Natural Airway  Additional Equipment:   Intra-op Plan:   Post-operative Plan:   Informed Consent: I have reviewed the patients History and Physical, chart, labs and discussed the procedure including the risks, benefits and alternatives for the proposed anesthesia with the patient or authorized representative who has indicated his/her understanding and acceptance.     Dental advisory given  Plan Discussed with: CRNA and Surgeon  Anesthesia Plan Comments:        Anesthesia Quick  Evaluation

## 2022-02-13 NOTE — Anesthesia Postprocedure Evaluation (Signed)
Anesthesia Post Note  Patient: MALIKI GIGNAC  Procedure(s) Performed: COLONOSCOPY WITH PROPOFOL ESOPHAGOGASTRODUODENOSCOPY (EGD) WITH PROPOFOL POLYPECTOMY  Patient location during evaluation: Phase II Anesthesia Type: General Level of consciousness: awake and alert and oriented Pain management: pain level controlled Vital Signs Assessment: post-procedure vital signs reviewed and stable Respiratory status: spontaneous breathing, nonlabored ventilation and respiratory function stable Cardiovascular status: blood pressure returned to baseline and stable Postop Assessment: no apparent nausea or vomiting Anesthetic complications: no  No notable events documented.   Last Vitals:  Vitals:   02/13/22 1258 02/13/22 1303  BP: (!) 86/47 104/64  Pulse: (!) 51 (!) 51  Resp:  17  Temp: 36.4 C   SpO2: 96% 94%    Last Pain:  Vitals:   02/13/22 1303  TempSrc:   PainSc: 0-No pain                 Geraldyne Barraclough C Courtnei Ruddell

## 2022-02-13 NOTE — Transfer of Care (Signed)
Immediate Anesthesia Transfer of Care Note  Patient: Jamie [name]  Procedure(s) Performed: COLONOSCOPY WITH PROPOFOL ESOPHAGOGASTRODUODENOSCOPY (EGD) WITH PROPOFOL POLYPECTOMY  Patient Location: Short Stay  Anesthesia Type:General  Level of Consciousness: awake, alert , and oriented  Airway & Oxygen Therapy: Patient Spontanous Breathing  Post-op Assessment: Report given to RN and Post -op Vital signs reviewed and stable  Post vital signs: Reviewed and stable  Last Vitals:  Vitals Value Taken Time  BP 86/47 02/13/22 1258  Temp 36.4 C 02/13/22 1258  Pulse 51 02/13/22 1258  Resp 16   SpO2 96 % 02/13/22 1258    Last Pain:  Vitals:   02/13/22 1258  TempSrc: Oral  PainSc:       Patients Stated Pain Goal: 4 (71/85/50 1586)  Complications: No notable events documented.

## 2022-02-13 NOTE — Op Note (Signed)
[facility] Patient Name: Jamie [name] Procedure Date: 02/13/2022 12:23 PM MRN: [id number] Date of Birth: 1971-01-24 Attending MD: Norvel Richards , MD, [id number] CSN: [id number] Age: 51 Admit Type: Outpatient Procedure:                Colonoscopy Indications:              Change in bowel habits Providers:                Norvel Richards, MD, Caprice Kluver, Aram Candela Referring MD:              Medicines:                Propofol per Anesthesia Complications:            No immediate complications. Estimated Blood Loss:     Estimated blood loss was minimal. Procedure:                Pre-Anesthesia Assessment:                           - Prior to the procedure, a History and Physical                            was performed, and patient medications and                            allergies were reviewed. The patient's tolerance of                            previous anesthesia was also reviewed. The risks                            and benefits of the procedure and the sedation                            options and risks were discussed with the patient.                            All questions were answered, and informed consent                            was obtained. Prior Anticoagulants: The patient has                            taken no anticoagulant or antiplatelet agents. ASA                            Grade Assessment: II - A patient with mild systemic                            disease. After reviewing the risks and benefits,                            the patient was deemed in satisfactory condition to  undergo the procedure.                           After obtaining informed consent, the colonoscope                            was passed under direct vision. Throughout the                            procedure, the patient's blood pressure, pulse, and                            oxygen saturations were monitored continuously. The                             [id number] scope was introduced through the                            anus and advanced to the the cecum, identified by                            appendiceal orifice and ileocecal valve. The                            colonoscopy was performed without difficulty. The                            patient tolerated the procedure well. The quality                            of the bowel preparation was adequate. The                            ileocecal valve, appendiceal orifice, and rectum                            were photographed. The entire colon was well                            visualized. Scope In: 12:44:13 PM Scope Out: 12:56:01 PM Scope Withdrawal Time: 0 hours 9 minutes 34 seconds  Total Procedure Duration: 0 hours 11 minutes 48 seconds  Findings:      The perianal and digital rectal examinations were normal.      Non-bleeding internal hemorrhoids were found during retroflexion. The       hemorrhoids were moderate, medium-sized and Grade I (internal       hemorrhoids that do not prolapse).      A 5 mm polyp was found in the ileocecal valve. The polyp was sessile.       The polyp was removed with a cold snare. Resection and retrieval were       complete. Estimated blood loss was minimal.      The exam was otherwise without abnormality on direct and retroflexion       views. Impression:               -  Non-bleeding internal hemorrhoids.                           - One 5 mm polyp at the ileocecal valve, removed                            with a cold snare. Resected and retrieved.                           - The examination was otherwise normal on direct                            and retroflexion views. Moderate Sedation:      Moderate (conscious) sedation was personally administered by an       anesthesia professional. The following parameters were monitored: oxygen       saturation, heart rate, blood pressure, respiratory rate, EKG, adequacy       of  pulmonary ventilation, and response to care. Recommendation:           - Patient has a contact number available for                            emergencies. The signs and symptoms of potential                            delayed complications were discussed with the                            patient. Return to normal activities tomorrow.                            Written discharge instructions were provided to the                            patient.                           - Advance diet as tolerated.                           - Continue present medications.                           - Repeat colonoscopy date to be determined after                            pending pathology results are reviewed for                            surveillance.                           - Return to GI office in 3 months. Procedure Code(s):        --- Professional ---  45385, Colonoscopy, flexible; with removal of                            tumor(s), polyp(s), or other lesion(s) by snare                            technique Diagnosis Code(s):        --- Professional ---                           D12.0, Benign neoplasm of cecum                           K64.0, First degree hemorrhoids                           R19.4, Change in bowel habit CPT copyright 2022 [employer] Association. All rights reserved. The codes documented in this report are preliminary and upon coder review may  be revised to meet current compliance requirements. [name]. Daveda Larock, MD Norvel Richards, MD 02/13/2022 1:03:54 PM This report has been signed electronically. Number of Addenda: 0

## 2022-02-14 ENCOUNTER — Encounter: Payer: Self-pay | Admitting: Internal Medicine

## 2022-02-14 LAB — SURGICAL PATHOLOGY

## 2022-02-17 ENCOUNTER — Encounter (HOSPITAL_COMMUNITY): Payer: Self-pay | Admitting: Internal Medicine

## 2022-02-18 DIAGNOSIS — F411 Generalized anxiety disorder: Secondary | ICD-10-CM | POA: Diagnosis not present

## 2022-02-18 DIAGNOSIS — Z6827 Body mass index (BMI) 27.0-27.9, adult: Secondary | ICD-10-CM | POA: Diagnosis not present

## 2022-02-18 DIAGNOSIS — M25562 Pain in left knee: Secondary | ICD-10-CM | POA: Diagnosis not present

## 2022-02-18 DIAGNOSIS — M545 Low back pain, unspecified: Secondary | ICD-10-CM | POA: Diagnosis not present

## 2022-02-18 DIAGNOSIS — Z79899 Other long term (current) drug therapy: Secondary | ICD-10-CM | POA: Diagnosis not present

## 2022-02-18 DIAGNOSIS — M25561 Pain in right knee: Secondary | ICD-10-CM | POA: Diagnosis not present

## 2022-02-18 DIAGNOSIS — G8929 Other chronic pain: Secondary | ICD-10-CM | POA: Diagnosis not present

## 2022-03-13 ENCOUNTER — Ambulatory Visit: Payer: Medicaid Other | Attending: Internal Medicine | Admitting: Internal Medicine

## 2022-03-13 DIAGNOSIS — I2510 Atherosclerotic heart disease of native coronary artery without angina pectoris: Secondary | ICD-10-CM | POA: Insufficient documentation

## 2022-03-13 NOTE — Progress Notes (Signed)
Cardiology Office Note:    Date:  03/13/2022   ID:  Jamie [name], DOB [birth year], MRN [id number]  PCP:  Lemmie Evens, [facility] Cardiologist:  Werner Lean, MD     Referring MD: Lemmie Evens, MD   CC: Minimal CAD  History of Present Illness:    Jamie [name] is a 51 y.o. male with a hx of shortness of breath and chest pain, tobacco. Query of Rhabdomyolysis in 06/11/2010. 2023: low risk for coloscopy; Normal LV function; sent stuff for patients prescription.  NO SHOW.  NO CHARGE.  CHART REVIEWED.   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   Hypertension    "Dr Karie Kirks took me off meds"  diet control   Lumbar radiculopathy    Pre-diabetes     Past Surgical History:  Procedure Laterality Date   BACK SURGERY     CARPAL TUNNEL RELEASE Right 06/25/2013   Procedure: CARPAL TUNNEL RELEASE;  Surgeon: Carole Civil, MD;  Location: [facility];  Service: Orthopedics;  Laterality: Right;   CARPAL TUNNEL RELEASE Left 07/25/2013   Procedure: LEFT CARPAL TUNNEL RELEASE;  Surgeon: Carole Civil, MD;  Location: [facility];  Service: Orthopedics;  Laterality: Left;   COLONOSCOPY WITH PROPOFOL N/A 04/05/2017   Surgeon: Daneil Dolin, MD; normal exam. Recommended 5 year repeat due to family history   COLONOSCOPY WITH PROPOFOL N/A 02/13/2022   Procedure: COLONOSCOPY WITH PROPOFOL;  Surgeon: Daneil Dolin, MD;  Location: [facility];  Service: Endoscopy;  Laterality: N/A;  9:15a,. asa 2   ELBOW SURGERY Left    ESOPHAGOGASTRODUODENOSCOPY (EGD) WITH PROPOFOL N/A 02/13/2022   Procedure: ESOPHAGOGASTRODUODENOSCOPY (EGD) WITH PROPOFOL;  Surgeon: Daneil Dolin, MD;  [facility];  Service: Endoscopy;  Laterality: N/A;   FOOT SURGERY Right    HERNIA REPAIR Right    inguinal- age 34   I & D EXTREMITY Left 06/12/2021   Procedure: IRRIGATION AND DEBRIDEMENT;  Surgeon: Iran Planas, MD;  Location: [facility];  Service: Orthopedics;  Laterality: Left;   KNEE ARTHROSCOPY WITH MEDIAL MENISECTOMY Left 12/07/2016   Procedure: KNEE ARTHROSCOPY WITH MEDIAL MENISECTOMY;  Surgeon: Carole Civil, MD;  Location: [facility];  Service: Orthopedics;  Laterality: Left;   KNEE SURGERY     left elbow     LUMBAR LAMINECTOMY/DECOMPRESSION MICRODISCECTOMY Left 10/06/2013   Procedure: Left Lumbar Three-four microdiskectomy;  Surgeon: Ophelia Charter, MD;  Location: [facility];  Service: Neurosurgery;  Laterality: Left;  Left Lumbar Three-four microdiskectomy   OPEN REDUCTION INTERNAL FIXATION (ORIF) HAND Left 06/12/2021   Procedure: OPEN REDUCTION INTERNAL FIXATION (ORIF) HAND;  Surgeon: Iran Planas, MD;  Location: [facility];  Service: Orthopedics;  Laterality: Left;   POLYPECTOMY  02/13/2022   Procedure: POLYPECTOMY;  Surgeon: Daneil Dolin, MD;  Location: [facility];  Service: Endoscopy;;   REPAIR OF RUPTURED PATELLA LIGAMENT Left 05/03/2020   Procedure: LEFT KNEE PATELLECTOMY;  Surgeon: Frederik Pear, MD;  Location: [facility];  Service: Orthopedics;  Laterality: Left;   right foot     forgein body removal   right  knee  orif right patella [place] 1993   SEPTOPLASTY     TOTAL KNEE ARTHROPLASTY Left 02/18/2019   Procedure: TOTAL KNEE ARTHROPLASTY;  Surgeon: Carole Civil, MD;  Location: [facility];  Service: Orthopedics;  Laterality: Left;    Current Medications: No outpatient medications have been marked as taking for the 03/13/22 encounter (Office Visit) with Werner Lean, MD.     Allergies:   Doxepin, Dronabinol, Tramadol, and Trazodone   Social History   Socioeconomic History   Marital status: Divorced    Spouse name: is seperated   Number of  children: Not on file   Years of education: 8th grade    Highest education level: Not on file  Occupational History   Occupation: unemployed    Fish farm manager: unemployed  Tobacco Use   Smoking status: Some Days    Packs/day: 1.00    Years: 15.00    Total pack years: 15.00    Types: Cigarettes   Smokeless tobacco: Former    Types: Chew    Quit date: 12/05/1990   Tobacco comments:    one pack a week  Vaping Use   Vaping Use: Never used  Substance and Sexual Activity   Alcohol use: No   Drug use: Yes    Frequency: 2.0 times per week    Types: Marijuana    Comment: 3 to 4 days ago last use   Sexual activity: Never    Birth control/protection: None  Other Topics Concern   Not on file  Social History Narrative   Not on file   Social Determinants of Health   Financial Resource Strain: Not on file  Food Insecurity: Not on file  Transportation Needs: Not on file  Physical Activity: Not on file  Stress: Not on file  Social Connections: Not on file     Family History: The patient's nofamily history includes Arthritis in an other family member; Asthma in an other family member; Cancer in an other family member; Colon cancer (age of onset: 39) in his father; Diabetes in an other family member; Heart disease in an other family member; Kidney disease in an other family member. History of coronary artery disease notable for mother with MI in the 34s. History of heart failure notable for no members. History of arrhythmia notable for atrial fibrillation on father. Denies family history of sudden cardiac death including drowning, car accidents, or unexplained deaths in the family. No history of bicuspid aortic valve or aortic aneurysm or dissection.   No history of cardiomyopathies including hypertrophic cardiomyopathy, left ventricular non-compaction, or arrhythmogenic right ventricular cardiomyopathy. No history of skeletal myopathies.   ROS:   Please see the history of present  illness.     All other systems reviewed and are negative.  EKGs/Labs/Other Studies Reviewed:    The following studies were reviewed today:  EKG:   12/08/21: Sinus bradycardia no q waves  Recent Labs: 06/12/2021: Hemoglobin 13.6 02/13/2022: BUN 17; Creatinine, Ser 0.91; Potassium 2.9; Sodium 138  Recent Lipid Panel    Component Value Date/Time   CHOL  06/10/2010 2133    131        ATP III CLASSIFICATION:  <200     mg/dL   Desirable  200-239  mg/dL   Borderline High  >=240    mg/dL   High          TRIG 67 06/10/2010 2133   HDL 39 (L) 06/10/2010 2133   CHOLHDL 3.4 06/10/2010 2133   VLDL 13  06/10/2010 2133   [place]  06/10/2010 2133    79        Total Cholesterol/HDL:CHD Risk Coronary Heart Disease Risk Table                     Men   Women  1/2 Average Risk   3.4   3.3  Average Risk       5.0   4.4  2 X Average Risk   9.6   7.1  3 X Average Risk  23.4   11.0        Use the calculated Patient Ratio above and the CHD Risk Table to determine the patient's CHD Risk.        ATP III CLASSIFICATION (LDL):  <100     mg/dL   Optimal  100-129  mg/dL   Near or Above                    Optimal  130-159  mg/dL   Borderline  160-189  mg/dL   High  >190     mg/dL   Very High        Physical Exam:    VS:  There were no vitals taken for this visit.    Wt Readings from Last 3 Encounters:  02/13/22 209 lb (94.8 kg)  12/14/21 207 lb (93.9 kg)  12/08/21 206 lb 3.2 oz (93.5 kg)    NO SHOW.  NO CHARGE.  CHART REVIEWED.   LAST EXAM.   GEN:  Well nourished, well developed in no acute distress HEENT: Normal NECK: No JVD; No carotid bruits LYMPHATICS: No lymphadenopathy CARDIAC: RRR, no murmurs, rubs, gallops RESPIRATORY:  Clear to auscultation without rales, wheezing or rhonchi  ABDOMEN: Soft, non-tender, non-distended MUSCULOSKELETAL:  No edema; No deformity  SKIN: Warm and dry NEUROLOGIC:  Alert and oriented x 3 PSYCHIATRIC:  Normal affect   ASSESSMENT:    No diagnosis  found.  PLAN:    Min non obstructive CAD Tobacco abuse FX fo CAD, pancreatic and lung cancer Profound weight loss - discussed smoking cessation at last visit  NO SHOW.  NO CHARGE.  CHART REVIEWED.     Medication Adjustments/Labs and Tests Ordered: Current medicines are reviewed at length with the patient today.  Concerns regarding medicines are outlined above.  No orders of the defined types were placed in this encounter.  No orders of the defined types were placed in this encounter.   There are no Patient Instructions on file for this visit.   Signed, Werner Lean, MD  03/13/2022 9:03 AM    [place]

## 2022-03-19 DIAGNOSIS — F411 Generalized anxiety disorder: Secondary | ICD-10-CM | POA: Diagnosis not present

## 2022-03-19 DIAGNOSIS — M25562 Pain in left knee: Secondary | ICD-10-CM | POA: Diagnosis not present

## 2022-03-19 DIAGNOSIS — M25561 Pain in right knee: Secondary | ICD-10-CM | POA: Diagnosis not present

## 2022-03-19 DIAGNOSIS — M545 Low back pain, unspecified: Secondary | ICD-10-CM | POA: Diagnosis not present

## 2022-03-19 DIAGNOSIS — G8929 Other chronic pain: Secondary | ICD-10-CM | POA: Diagnosis not present

## 2022-03-19 DIAGNOSIS — Z79899 Other long term (current) drug therapy: Secondary | ICD-10-CM | POA: Diagnosis not present

## 2022-03-19 DIAGNOSIS — Z6827 Body mass index (BMI) 27.0-27.9, adult: Secondary | ICD-10-CM | POA: Diagnosis not present

## 2022-04-19 ENCOUNTER — Encounter: Payer: Self-pay | Admitting: Gastroenterology

## 2022-04-20 DIAGNOSIS — M25561 Pain in right knee: Secondary | ICD-10-CM | POA: Diagnosis not present

## 2022-04-20 DIAGNOSIS — Z6827 Body mass index (BMI) 27.0-27.9, adult: Secondary | ICD-10-CM | POA: Diagnosis not present

## 2022-04-20 DIAGNOSIS — G8929 Other chronic pain: Secondary | ICD-10-CM | POA: Diagnosis not present

## 2022-04-20 DIAGNOSIS — M25562 Pain in left knee: Secondary | ICD-10-CM | POA: Diagnosis not present

## 2022-04-20 DIAGNOSIS — M545 Low back pain, unspecified: Secondary | ICD-10-CM | POA: Diagnosis not present

## 2022-04-20 DIAGNOSIS — F411 Generalized anxiety disorder: Secondary | ICD-10-CM | POA: Diagnosis not present

## 2022-04-20 DIAGNOSIS — Z79899 Other long term (current) drug therapy: Secondary | ICD-10-CM | POA: Diagnosis not present

## 2022-04-24 DIAGNOSIS — Z79899 Other long term (current) drug therapy: Secondary | ICD-10-CM | POA: Diagnosis not present

## 2022-05-22 DIAGNOSIS — Z6827 Body mass index (BMI) 27.0-27.9, adult: Secondary | ICD-10-CM | POA: Diagnosis not present

## 2022-05-22 DIAGNOSIS — F411 Generalized anxiety disorder: Secondary | ICD-10-CM | POA: Diagnosis not present

## 2022-05-22 DIAGNOSIS — M25561 Pain in right knee: Secondary | ICD-10-CM | POA: Diagnosis not present

## 2022-05-22 DIAGNOSIS — M545 Low back pain, unspecified: Secondary | ICD-10-CM | POA: Diagnosis not present

## 2022-05-22 DIAGNOSIS — M25562 Pain in left knee: Secondary | ICD-10-CM | POA: Diagnosis not present

## 2022-05-22 DIAGNOSIS — Z79899 Other long term (current) drug therapy: Secondary | ICD-10-CM | POA: Diagnosis not present

## 2022-05-22 DIAGNOSIS — G8929 Other chronic pain: Secondary | ICD-10-CM | POA: Diagnosis not present

## 2022-05-24 DIAGNOSIS — Z79899 Other long term (current) drug therapy: Secondary | ICD-10-CM | POA: Diagnosis not present

## 2022-06-20 DIAGNOSIS — M25562 Pain in left knee: Secondary | ICD-10-CM | POA: Diagnosis not present

## 2022-06-20 DIAGNOSIS — M25561 Pain in right knee: Secondary | ICD-10-CM | POA: Diagnosis not present

## 2022-06-20 DIAGNOSIS — Z79899 Other long term (current) drug therapy: Secondary | ICD-10-CM | POA: Diagnosis not present

## 2022-06-20 DIAGNOSIS — Z6827 Body mass index (BMI) 27.0-27.9, adult: Secondary | ICD-10-CM | POA: Diagnosis not present

## 2022-06-20 DIAGNOSIS — G8929 Other chronic pain: Secondary | ICD-10-CM | POA: Diagnosis not present

## 2022-06-20 DIAGNOSIS — F411 Generalized anxiety disorder: Secondary | ICD-10-CM | POA: Diagnosis not present

## 2022-06-20 DIAGNOSIS — M545 Low back pain, unspecified: Secondary | ICD-10-CM | POA: Diagnosis not present

## 2022-06-22 DIAGNOSIS — Z79899 Other long term (current) drug therapy: Secondary | ICD-10-CM | POA: Diagnosis not present

## 2022-07-05 DIAGNOSIS — I10 Essential (primary) hypertension: Secondary | ICD-10-CM | POA: Diagnosis not present

## 2022-07-05 DIAGNOSIS — F419 Anxiety disorder, unspecified: Secondary | ICD-10-CM | POA: Diagnosis not present

## 2022-07-05 DIAGNOSIS — E6609 Other obesity due to excess calories: Secondary | ICD-10-CM | POA: Diagnosis not present

## 2022-07-05 DIAGNOSIS — G4700 Insomnia, unspecified: Secondary | ICD-10-CM | POA: Diagnosis not present

## 2022-07-21 DIAGNOSIS — F411 Generalized anxiety disorder: Secondary | ICD-10-CM | POA: Diagnosis not present

## 2022-07-21 DIAGNOSIS — M545 Low back pain, unspecified: Secondary | ICD-10-CM | POA: Diagnosis not present

## 2022-07-21 DIAGNOSIS — M25562 Pain in left knee: Secondary | ICD-10-CM | POA: Diagnosis not present

## 2022-07-21 DIAGNOSIS — M25561 Pain in right knee: Secondary | ICD-10-CM | POA: Diagnosis not present

## 2022-07-21 DIAGNOSIS — Z79899 Other long term (current) drug therapy: Secondary | ICD-10-CM | POA: Diagnosis not present

## 2022-07-21 DIAGNOSIS — Z6827 Body mass index (BMI) 27.0-27.9, adult: Secondary | ICD-10-CM | POA: Diagnosis not present

## 2022-07-25 DIAGNOSIS — Z79899 Other long term (current) drug therapy: Secondary | ICD-10-CM | POA: Diagnosis not present

## 2022-08-17 DIAGNOSIS — Z79899 Other long term (current) drug therapy: Secondary | ICD-10-CM | POA: Diagnosis not present

## 2022-08-17 DIAGNOSIS — F411 Generalized anxiety disorder: Secondary | ICD-10-CM | POA: Diagnosis not present

## 2022-08-17 DIAGNOSIS — M545 Low back pain, unspecified: Secondary | ICD-10-CM | POA: Diagnosis not present

## 2022-08-17 DIAGNOSIS — Z6827 Body mass index (BMI) 27.0-27.9, adult: Secondary | ICD-10-CM | POA: Diagnosis not present

## 2022-08-17 DIAGNOSIS — M25562 Pain in left knee: Secondary | ICD-10-CM | POA: Diagnosis not present

## 2022-08-17 DIAGNOSIS — M25561 Pain in right knee: Secondary | ICD-10-CM | POA: Diagnosis not present

## 2022-08-18 ENCOUNTER — Other Ambulatory Visit (HOSPITAL_COMMUNITY): Payer: Self-pay | Admitting: Nurse Practitioner

## 2022-08-18 DIAGNOSIS — Z122 Encounter for screening for malignant neoplasm of respiratory organs: Secondary | ICD-10-CM

## 2022-08-21 DIAGNOSIS — Z79899 Other long term (current) drug therapy: Secondary | ICD-10-CM | POA: Diagnosis not present

## 2022-09-20 DIAGNOSIS — M545 Low back pain, unspecified: Secondary | ICD-10-CM | POA: Diagnosis not present

## 2022-09-20 DIAGNOSIS — Z79899 Other long term (current) drug therapy: Secondary | ICD-10-CM | POA: Diagnosis not present

## 2022-09-22 DIAGNOSIS — Z79899 Other long term (current) drug therapy: Secondary | ICD-10-CM | POA: Diagnosis not present

## 2022-09-27 DIAGNOSIS — Z79891 Long term (current) use of opiate analgesic: Secondary | ICD-10-CM | POA: Diagnosis not present

## 2022-09-27 DIAGNOSIS — M545 Low back pain, unspecified: Secondary | ICD-10-CM | POA: Diagnosis not present

## 2022-09-27 DIAGNOSIS — F331 Major depressive disorder, recurrent, moderate: Secondary | ICD-10-CM | POA: Diagnosis not present

## 2022-09-27 DIAGNOSIS — G4700 Insomnia, unspecified: Secondary | ICD-10-CM | POA: Diagnosis not present

## 2022-10-19 DIAGNOSIS — M545 Low back pain, unspecified: Secondary | ICD-10-CM | POA: Diagnosis not present

## 2022-10-19 DIAGNOSIS — Z79899 Other long term (current) drug therapy: Secondary | ICD-10-CM | POA: Diagnosis not present

## 2022-10-20 ENCOUNTER — Ambulatory Visit (HOSPITAL_COMMUNITY): Payer: Medicaid Other

## 2022-10-23 ENCOUNTER — Ambulatory Visit (HOSPITAL_COMMUNITY): Admission: RE | Admit: 2022-10-23 | Payer: Medicaid Other | Source: Ambulatory Visit

## 2022-10-23 ENCOUNTER — Encounter (HOSPITAL_COMMUNITY): Payer: Self-pay

## 2022-10-23 DIAGNOSIS — Z79899 Other long term (current) drug therapy: Secondary | ICD-10-CM | POA: Diagnosis not present

## 2022-11-19 DIAGNOSIS — Z6829 Body mass index (BMI) 29.0-29.9, adult: Secondary | ICD-10-CM | POA: Diagnosis not present

## 2022-11-19 DIAGNOSIS — Z79899 Other long term (current) drug therapy: Secondary | ICD-10-CM | POA: Diagnosis not present

## 2022-11-19 DIAGNOSIS — R030 Elevated blood-pressure reading, without diagnosis of hypertension: Secondary | ICD-10-CM | POA: Diagnosis not present

## 2022-11-19 DIAGNOSIS — M545 Low back pain, unspecified: Secondary | ICD-10-CM | POA: Diagnosis not present

## 2022-11-21 ENCOUNTER — Ambulatory Visit: Payer: Medicaid Other | Admitting: Internal Medicine

## 2022-11-22 ENCOUNTER — Encounter: Payer: Self-pay | Admitting: Family Medicine

## 2022-11-22 DIAGNOSIS — Z79899 Other long term (current) drug therapy: Secondary | ICD-10-CM | POA: Diagnosis not present

## 2022-12-21 DIAGNOSIS — M25562 Pain in left knee: Secondary | ICD-10-CM | POA: Diagnosis not present

## 2022-12-21 DIAGNOSIS — M545 Low back pain, unspecified: Secondary | ICD-10-CM | POA: Diagnosis not present

## 2022-12-21 DIAGNOSIS — R030 Elevated blood-pressure reading, without diagnosis of hypertension: Secondary | ICD-10-CM | POA: Diagnosis not present

## 2022-12-21 DIAGNOSIS — M25561 Pain in right knee: Secondary | ICD-10-CM | POA: Diagnosis not present

## 2022-12-21 DIAGNOSIS — Z79899 Other long term (current) drug therapy: Secondary | ICD-10-CM | POA: Diagnosis not present

## 2022-12-25 DIAGNOSIS — Z79899 Other long term (current) drug therapy: Secondary | ICD-10-CM | POA: Diagnosis not present

## 2023-01-19 DIAGNOSIS — Z122 Encounter for screening for malignant neoplasm of respiratory organs: Secondary | ICD-10-CM | POA: Diagnosis not present

## 2023-01-19 DIAGNOSIS — Z6828 Body mass index (BMI) 28.0-28.9, adult: Secondary | ICD-10-CM | POA: Diagnosis not present

## 2023-01-19 DIAGNOSIS — Z79899 Other long term (current) drug therapy: Secondary | ICD-10-CM | POA: Diagnosis not present

## 2023-01-19 DIAGNOSIS — M545 Low back pain, unspecified: Secondary | ICD-10-CM | POA: Diagnosis not present

## 2023-01-19 DIAGNOSIS — R030 Elevated blood-pressure reading, without diagnosis of hypertension: Secondary | ICD-10-CM | POA: Diagnosis not present

## 2023-01-22 ENCOUNTER — Other Ambulatory Visit (HOSPITAL_COMMUNITY): Payer: Self-pay | Admitting: Nurse Practitioner

## 2023-01-22 DIAGNOSIS — Z122 Encounter for screening for malignant neoplasm of respiratory organs: Secondary | ICD-10-CM

## 2023-01-23 DIAGNOSIS — Z79899 Other long term (current) drug therapy: Secondary | ICD-10-CM | POA: Diagnosis not present

## 2023-02-20 DIAGNOSIS — Z6828 Body mass index (BMI) 28.0-28.9, adult: Secondary | ICD-10-CM | POA: Diagnosis not present

## 2023-02-20 DIAGNOSIS — Z79899 Other long term (current) drug therapy: Secondary | ICD-10-CM | POA: Diagnosis not present

## 2023-02-20 DIAGNOSIS — R030 Elevated blood-pressure reading, without diagnosis of hypertension: Secondary | ICD-10-CM | POA: Diagnosis not present

## 2023-02-20 DIAGNOSIS — M545 Low back pain, unspecified: Secondary | ICD-10-CM | POA: Diagnosis not present

## 2023-02-22 DIAGNOSIS — Z79899 Other long term (current) drug therapy: Secondary | ICD-10-CM | POA: Diagnosis not present

## 2023-03-22 DIAGNOSIS — M545 Low back pain, unspecified: Secondary | ICD-10-CM | POA: Diagnosis not present

## 2023-03-22 DIAGNOSIS — Z6828 Body mass index (BMI) 28.0-28.9, adult: Secondary | ICD-10-CM | POA: Diagnosis not present

## 2023-03-22 DIAGNOSIS — Z79899 Other long term (current) drug therapy: Secondary | ICD-10-CM | POA: Diagnosis not present

## 2023-03-22 DIAGNOSIS — R030 Elevated blood-pressure reading, without diagnosis of hypertension: Secondary | ICD-10-CM | POA: Diagnosis not present

## 2023-03-27 DIAGNOSIS — Z79899 Other long term (current) drug therapy: Secondary | ICD-10-CM | POA: Diagnosis not present

## 2023-04-26 DIAGNOSIS — K219 Gastro-esophageal reflux disease without esophagitis: Secondary | ICD-10-CM | POA: Diagnosis not present

## 2023-04-26 DIAGNOSIS — Z6828 Body mass index (BMI) 28.0-28.9, adult: Secondary | ICD-10-CM | POA: Diagnosis not present

## 2023-04-26 DIAGNOSIS — R030 Elevated blood-pressure reading, without diagnosis of hypertension: Secondary | ICD-10-CM | POA: Diagnosis not present

## 2023-04-26 DIAGNOSIS — Z79899 Other long term (current) drug therapy: Secondary | ICD-10-CM | POA: Diagnosis not present

## 2023-04-26 DIAGNOSIS — I10 Essential (primary) hypertension: Secondary | ICD-10-CM | POA: Diagnosis not present

## 2023-04-26 DIAGNOSIS — F411 Generalized anxiety disorder: Secondary | ICD-10-CM | POA: Diagnosis not present

## 2023-04-26 DIAGNOSIS — M545 Low back pain, unspecified: Secondary | ICD-10-CM | POA: Diagnosis not present

## 2023-04-30 DIAGNOSIS — Z79899 Other long term (current) drug therapy: Secondary | ICD-10-CM | POA: Diagnosis not present

## 2023-06-02 DIAGNOSIS — Z79899 Other long term (current) drug therapy: Secondary | ICD-10-CM | POA: Diagnosis not present

## 2023-06-06 DIAGNOSIS — Z79899 Other long term (current) drug therapy: Secondary | ICD-10-CM | POA: Diagnosis not present

## 2023-07-18 ENCOUNTER — Emergency Department (HOSPITAL_COMMUNITY)

## 2023-07-18 ENCOUNTER — Encounter (HOSPITAL_COMMUNITY): Payer: Self-pay | Admitting: Emergency Medicine

## 2023-07-18 ENCOUNTER — Inpatient Hospital Stay (HOSPITAL_COMMUNITY)
Admission: EM | Admit: 2023-07-18 | Discharge: 2023-07-21 | DRG: 918 | Discharge status: Other Acute Inpatient Hospital | Attending: Internal Medicine | Admitting: Internal Medicine

## 2023-07-18 ENCOUNTER — Other Ambulatory Visit: Payer: Self-pay

## 2023-07-18 DIAGNOSIS — R4182 Altered mental status, unspecified: Secondary | ICD-10-CM | POA: Diagnosis not present

## 2023-07-18 DIAGNOSIS — K7400 Hepatic fibrosis, unspecified: Secondary | ICD-10-CM | POA: Diagnosis present

## 2023-07-18 DIAGNOSIS — G8929 Other chronic pain: Secondary | ICD-10-CM | POA: Diagnosis present

## 2023-07-18 DIAGNOSIS — K219 Gastro-esophageal reflux disease without esophagitis: Secondary | ICD-10-CM | POA: Diagnosis present

## 2023-07-18 DIAGNOSIS — F1414 Cocaine abuse with cocaine-induced mood disorder: Secondary | ICD-10-CM | POA: Diagnosis present

## 2023-07-18 DIAGNOSIS — R680 Hypothermia, not associated with low environmental temperature: Secondary | ICD-10-CM | POA: Diagnosis present

## 2023-07-18 DIAGNOSIS — I160 Hypertensive urgency: Secondary | ICD-10-CM | POA: Diagnosis present

## 2023-07-18 DIAGNOSIS — F411 Generalized anxiety disorder: Secondary | ICD-10-CM | POA: Diagnosis not present

## 2023-07-18 DIAGNOSIS — R001 Bradycardia, unspecified: Secondary | ICD-10-CM | POA: Diagnosis not present

## 2023-07-18 DIAGNOSIS — F1994 Other psychoactive substance use, unspecified with psychoactive substance-induced mood disorder: Secondary | ICD-10-CM | POA: Diagnosis present

## 2023-07-18 DIAGNOSIS — F12188 Cannabis abuse with other cannabis-induced disorder: Secondary | ICD-10-CM | POA: Diagnosis present

## 2023-07-18 DIAGNOSIS — Z7982 Long term (current) use of aspirin: Secondary | ICD-10-CM

## 2023-07-18 DIAGNOSIS — T50901A Poisoning by unspecified drugs, medicaments and biological substances, accidental (unintentional), initial encounter: Secondary | ICD-10-CM | POA: Diagnosis present

## 2023-07-18 DIAGNOSIS — I2510 Atherosclerotic heart disease of native coronary artery without angina pectoris: Secondary | ICD-10-CM | POA: Diagnosis not present

## 2023-07-18 DIAGNOSIS — F329 Major depressive disorder, single episode, unspecified: Secondary | ICD-10-CM | POA: Diagnosis not present

## 2023-07-18 DIAGNOSIS — F17210 Nicotine dependence, cigarettes, uncomplicated: Secondary | ICD-10-CM | POA: Diagnosis present

## 2023-07-18 DIAGNOSIS — M25569 Pain in unspecified knee: Secondary | ICD-10-CM | POA: Diagnosis present

## 2023-07-18 DIAGNOSIS — T465X2A Poisoning by other antihypertensive drugs, intentional self-harm, initial encounter: Principal | ICD-10-CM | POA: Diagnosis present

## 2023-07-18 DIAGNOSIS — Z634 Disappearance and death of family member: Secondary | ICD-10-CM

## 2023-07-18 DIAGNOSIS — M549 Dorsalgia, unspecified: Secondary | ICD-10-CM | POA: Diagnosis present

## 2023-07-18 DIAGNOSIS — K7460 Unspecified cirrhosis of liver: Secondary | ICD-10-CM | POA: Diagnosis not present

## 2023-07-18 DIAGNOSIS — I10 Essential (primary) hypertension: Secondary | ICD-10-CM | POA: Diagnosis not present

## 2023-07-18 DIAGNOSIS — T50904A Poisoning by unspecified drugs, medicaments and biological substances, undetermined, initial encounter: Secondary | ICD-10-CM

## 2023-07-18 DIAGNOSIS — Z720 Tobacco use: Secondary | ICD-10-CM | POA: Diagnosis present

## 2023-07-18 DIAGNOSIS — F332 Major depressive disorder, recurrent severe without psychotic features: Secondary | ICD-10-CM | POA: Diagnosis present

## 2023-07-18 DIAGNOSIS — T1491XA Suicide attempt, initial encounter: Secondary | ICD-10-CM | POA: Diagnosis not present

## 2023-07-18 DIAGNOSIS — W19XXXA Unspecified fall, initial encounter: Secondary | ICD-10-CM | POA: Diagnosis not present

## 2023-07-18 DIAGNOSIS — Z79899 Other long term (current) drug therapy: Secondary | ICD-10-CM | POA: Diagnosis not present

## 2023-07-18 DIAGNOSIS — T887XXA Unspecified adverse effect of drug or medicament, initial encounter: Secondary | ICD-10-CM | POA: Diagnosis not present

## 2023-07-18 DIAGNOSIS — F32A Depression, unspecified: Secondary | ICD-10-CM | POA: Diagnosis not present

## 2023-07-18 LAB — CBC
HCT: 40.1 % (ref 39.0–52.0)
Hemoglobin: 14.1 g/dL (ref 13.0–17.0)
MCH: 31.5 pg (ref 26.0–34.0)
MCHC: 35.2 g/dL (ref 30.0–36.0)
MCV: 89.7 fL (ref 80.0–100.0)
Platelets: 281 10*3/uL (ref 150–400)
RBC: 4.47 MIL/uL (ref 4.22–5.81)
RDW: 13.2 % (ref 11.5–15.5)
WBC: 6.9 10*3/uL (ref 4.0–10.5)
nRBC: 0 % (ref 0.0–0.2)

## 2023-07-18 LAB — RAPID URINE DRUG SCREEN, HOSP PERFORMED
Amphetamines: NOT DETECTED
Barbiturates: NOT DETECTED
Benzodiazepines: NOT DETECTED
Cocaine: POSITIVE — AB
Opiates: NOT DETECTED
Tetrahydrocannabinol: POSITIVE — AB

## 2023-07-18 LAB — CBG MONITORING, ED: Glucose-Capillary: 175 mg/dL — ABNORMAL HIGH (ref 70–99)

## 2023-07-18 LAB — MAGNESIUM: Magnesium: 2 mg/dL (ref 1.7–2.4)

## 2023-07-18 LAB — COMPREHENSIVE METABOLIC PANEL WITH GFR
ALT: 13 U/L (ref 0–44)
AST: 13 U/L — ABNORMAL LOW (ref 15–41)
Albumin: 3.8 g/dL (ref 3.5–5.0)
Alkaline Phosphatase: 35 U/L — ABNORMAL LOW (ref 38–126)
Anion gap: 8 (ref 5–15)
BUN: 20 mg/dL (ref 6–20)
CO2: 22 mmol/L (ref 22–32)
Calcium: 8.9 mg/dL (ref 8.9–10.3)
Chloride: 104 mmol/L (ref 98–111)
Creatinine, Ser: 0.85 mg/dL (ref 0.61–1.24)
GFR, Estimated: >60 mL/min (ref >60)
Glucose, Bld: 166 mg/dL — ABNORMAL HIGH (ref 70–99)
Potassium: 3.9 mmol/L (ref 3.5–5.1)
Sodium: 134 mmol/L — ABNORMAL LOW (ref 135–145)
Total Bilirubin: 1.1 mg/dL (ref 0.0–1.2)
Total Protein: 6.9 g/dL (ref 6.5–8.1)

## 2023-07-18 LAB — ETHANOL: Alcohol, Ethyl (B): <10 mg/dL (ref <10)

## 2023-07-18 LAB — ACETAMINOPHEN LEVEL: Acetaminophen (Tylenol), Serum: <10 ug/mL — ABNORMAL LOW (ref 10–30)

## 2023-07-18 LAB — SALICYLATE LEVEL: Salicylate Lvl: <7 mg/dL — ABNORMAL LOW (ref 7.0–30.0)

## 2023-07-18 LAB — MRSA NEXT GEN BY PCR, NASAL: MRSA by PCR Next Gen: NOT DETECTED

## 2023-07-18 LAB — HIV ANTIBODY (ROUTINE TESTING W REFLEX): HIV Screen 4th Generation wRfx: NONREACTIVE

## 2023-07-18 LAB — PHOSPHORUS: Phosphorus: 4.2 mg/dL (ref 2.5–4.6)

## 2023-07-18 MED ORDER — SODIUM CHLORIDE 0.9 % IV SOLN: INTRAVENOUS | Status: DC

## 2023-07-18 MED ORDER — FLEET ENEMA RE ENEM: 1.0000 | ENEMA | Freq: Once | RECTAL | Status: DC | PRN

## 2023-07-18 MED ORDER — SODIUM CHLORIDE 0.9% FLUSH
3.0000 mL | Freq: Two times a day (BID) | INTRAVENOUS | Status: DC
  Administered 2023-07-18 – 2023-07-21 (×7): 3 mL via INTRAVENOUS

## 2023-07-18 MED ORDER — ACETAMINOPHEN 650 MG RE SUPP: 650.0000 mg | Freq: Four times a day (QID) | RECTAL | Status: DC | PRN

## 2023-07-18 MED ORDER — ONDANSETRON HCL 4 MG/2ML IJ SOLN
4.0000 mg | Freq: Four times a day (QID) | INTRAMUSCULAR | Status: DC | PRN
  Administered 2023-07-19: 4 mg via INTRAVENOUS
  Filled 2023-07-18: qty 2

## 2023-07-18 MED ORDER — HEPARIN SODIUM (PORCINE) 5000 UNIT/ML IJ SOLN
5000.0000 [IU] | Freq: Three times a day (TID) | INTRAMUSCULAR | Status: DC
  Administered 2023-07-18 – 2023-07-21 (×10): 5000 [IU] via SUBCUTANEOUS
  Filled 2023-07-18 (×10): qty 1

## 2023-07-18 MED ORDER — ZOLPIDEM TARTRATE 5 MG PO TABS: 5.0000 mg | ORAL_TABLET | Freq: Every evening | ORAL | Status: DC | PRN

## 2023-07-18 MED ORDER — PANTOPRAZOLE SODIUM 40 MG PO TBEC
40.0000 mg | DELAYED_RELEASE_TABLET | Freq: Every day | ORAL | Status: DC
  Administered 2023-07-18 – 2023-07-21 (×4): 40 mg via ORAL
  Filled 2023-07-18 (×4): qty 1

## 2023-07-18 MED ORDER — MIRTAZAPINE 15 MG PO TABS
7.5000 mg | ORAL_TABLET | Freq: Every day | ORAL | Status: DC
  Administered 2023-07-19 – 2023-07-21 (×3): 7.5 mg via ORAL
  Filled 2023-07-18 (×3): qty 1

## 2023-07-18 MED ORDER — BISACODYL 5 MG PO TBEC: 5.0000 mg | DELAYED_RELEASE_TABLET | Freq: Every day | ORAL | Status: DC | PRN

## 2023-07-18 MED ORDER — TAMSULOSIN HCL 0.4 MG PO CAPS
0.4000 mg | ORAL_CAPSULE | Freq: Every day | ORAL | Status: DC
  Administered 2023-07-18 – 2023-07-21 (×4): 0.4 mg via ORAL
  Filled 2023-07-18 (×4): qty 1

## 2023-07-18 MED ORDER — CHLORHEXIDINE GLUCONATE CLOTH 2 % EX PADS
6.0000 | MEDICATED_PAD | Freq: Every day | CUTANEOUS | Status: DC
  Administered 2023-07-18: 6 via TOPICAL

## 2023-07-18 MED ORDER — SODIUM CHLORIDE 0.9% FLUSH
3.0000 mL | Freq: Two times a day (BID) | INTRAVENOUS | Status: DC
  Administered 2023-07-18: 3 mL via INTRAVENOUS

## 2023-07-18 MED ORDER — OXYCODONE HCL 5 MG PO TABS
5.0000 mg | ORAL_TABLET | ORAL | Status: DC | PRN
  Administered 2023-07-19 (×2): 5 mg via ORAL
  Filled 2023-07-18 (×2): qty 1

## 2023-07-18 MED ORDER — SODIUM CHLORIDE 0.9 % IV SOLN: INTRAVENOUS | Status: AC

## 2023-07-18 MED ORDER — ACETAMINOPHEN 325 MG PO TABS
650.0000 mg | ORAL_TABLET | Freq: Four times a day (QID) | ORAL | Status: DC | PRN
  Administered 2023-07-18 – 2023-07-21 (×3): 650 mg via ORAL
  Filled 2023-07-18 (×3): qty 2

## 2023-07-18 MED ORDER — HYDROMORPHONE HCL 1 MG/ML IJ SOLN
0.5000 mg | INTRAMUSCULAR | Status: DC | PRN
  Administered 2023-07-19: 1 mg via INTRAVENOUS
  Filled 2023-07-18: qty 1

## 2023-07-18 MED ORDER — ASPIRIN 81 MG PO TBEC
81.0000 mg | DELAYED_RELEASE_TABLET | Freq: Every day | ORAL | Status: DC
  Administered 2023-07-18 – 2023-07-21 (×4): 81 mg via ORAL
  Filled 2023-07-18 (×4): qty 1

## 2023-07-18 MED ORDER — SODIUM CHLORIDE 0.9 % IV BOLUS
1000.0000 mL | Freq: Once | INTRAVENOUS | Status: AC
  Administered 2023-07-18: 1000 mL via INTRAVENOUS

## 2023-07-18 MED ORDER — HYDRALAZINE HCL 20 MG/ML IJ SOLN
10.0000 mg | INTRAMUSCULAR | Status: DC | PRN
Start: 2023-07-18 — End: 2023-07-19

## 2023-07-18 MED ORDER — IPRATROPIUM BROMIDE 0.02 % IN SOLN: 0.5000 mg | Freq: Four times a day (QID) | RESPIRATORY_TRACT | Status: DC | PRN

## 2023-07-18 MED ORDER — ONDANSETRON HCL 4 MG PO TABS: 4.0000 mg | ORAL_TABLET | Freq: Four times a day (QID) | ORAL | Status: DC | PRN

## 2023-07-18 MED ORDER — CITALOPRAM HYDROBROMIDE 20 MG PO TABS
20.0000 mg | ORAL_TABLET | Freq: Every day | ORAL | Status: DC
  Administered 2023-07-19 – 2023-07-21 (×3): 20 mg via ORAL
  Filled 2023-07-18 (×3): qty 1

## 2023-07-18 MED ORDER — HYDRALAZINE HCL 20 MG/ML IJ SOLN: 10.0000 mg | INTRAMUSCULAR | Status: DC | PRN

## 2023-07-18 MED ORDER — HYDROCHLOROTHIAZIDE 25 MG PO TABS
25.0000 mg | ORAL_TABLET | Freq: Every day | ORAL | Status: DC
  Filled 2023-07-18: qty 1

## 2023-07-18 MED ORDER — NALOXONE HCL 4 MG/0.1ML NA LIQD: 1.0000 | NASAL | Status: DC | PRN

## 2023-07-18 MED ORDER — HALOPERIDOL LACTATE 5 MG/ML IJ SOLN: 2.0000 mg | Freq: Four times a day (QID) | INTRAMUSCULAR | Status: DC | PRN

## 2023-07-18 MED ORDER — LEVALBUTEROL HCL 0.63 MG/3ML IN NEBU: 0.6300 mg | INHALATION_SOLUTION | Freq: Four times a day (QID) | RESPIRATORY_TRACT | Status: DC | PRN

## 2023-07-18 MED ORDER — SENNOSIDES-DOCUSATE SODIUM 8.6-50 MG PO TABS: 1.0000 | ORAL_TABLET | Freq: Every evening | ORAL | Status: DC | PRN

## 2023-07-18 NOTE — ED Notes (Signed)
 Pt changed into hospital gown. Belongings placed in bag: tshirt, pants, white shoes, belt, wallet, phone, set of keys.

## 2023-07-18 NOTE — Progress Notes (Signed)
 Multiple conversations with MD Shahmehdi and [facility] Mike Gip as well as ED nurse for clarification regarding patient's IVC status.Patient has not had IVC paperwork signed or in chart/ patient TTS consult stating pt is agreeable to voluntary inpatient admission but remains suicide risk and is therefore to only be IVC'd if attempting to leave AMA, 1:1 sitter to remain present for suicide precautions .   Other concerns regarding patient addressed with MD Shahmehdi to include ongoing urinary retention/and prescense of two urethral openings/ I/O complete with 800 UOP

## 2023-07-18 NOTE — H&P (Signed)
 History and Physical   Patient: Jamie [name]                            PCP: Pcp, No                    DOB: [birth year]            DOA: 07/18/2023 [id number]             DOS: 07/18/2023, 1:16 PM  Pcp, No  Patient coming from:   HOME  I have personally reviewed patient's medical records, in electronic medical records, including:  [place] [facility], and care everywhere.    Chief Complaint:   Chief Complaint  Patient presents with   Drug Overdose    History of present illness:    Jamie [name] is a 53 year old male with extensive history of major depressive disorder and anxiety, tobacco abuse, coronary artery disease, GERD hepatic cirrhosis... Presented to the ED on 07/18/2023 around 3:48 PM for overdose on clonidine. Patient a lot of his clonidine medication-more than he should. Per ED report he took over 100 tablets of 0.1 mg. Patient denies of suicide attempt ideation or plan. Currently he is somnolent otherwise arousable, following commands.    ED evaluation/course: Blood pressure (!) 118/96, pulse (!) 29, temperature (!) 95.6 F (35.3 C), temperature source Rectal, resp. rate 16, height 5\' 11"  (1.803 m), weight 95 kg, SpO2 99%.  -Labs: Reviewed essentially within normal limits with exception of sodium 134, glucose 166, alk phos 35 AST 13 ALT 13 Tylenol and aspirin level <10 and < 7.0  Urine drug screen positive for Cocaine and Marijuana   Patient has been evaluated by psych for possible attempt of suicide... After extensive discussion patient has agreed to inpatient treatment.  This morning patient was found hypothermic, bradycardic. Requested patient to be admitted for observation and medical clearance.       Patient Denies having: Fever, Chills, Cough, SOB, Chest Pain, Abd pain, N/V/D, headache, dizziness, lightheadedness,  Dysuria, Joint pain, rash, open wounds   Currently patient denies of having any homicidal suicidal ideation. Stating ingestion of  medication was accidental.    Review of Systems: As per HPI, otherwise 10 point review of systems were negative.   ----------------------------------------------------------------------------------------------------------------------  Allergies  Allergen Reactions   Doxepin    Dronabinol    Tramadol    Trazodone     Home MEDs:  Prior to Admission medications   Medication Sig Start Date End Date Taking? Authorizing Provider  aspirin EC 81 MG tablet Take 1 tablet (81 mg total) by mouth daily. Swallow whole. 12/28/21   Chandrasekhar, Mahesh A, MD  citalopram (CELEXA) 20 MG tablet Take 20 mg by mouth daily. 09/09/21   [provider]  citalopram (CELEXA) 40 MG tablet Take 40 mg by mouth daily. 04/20/21   [provider]  diazepam (VALIUM) 10 MG tablet Take 10 mg by mouth 2 (two) times daily.  05/19/18   [provider]  diclofenac Sodium (VOLTAREN) 1 % GEL Apply 1 application topically in the morning, at noon, in the evening, and at bedtime. 05/20/21   [provider]  gabapentin (NEURONTIN) 100 MG capsule TAKE 1 CAPSULE(100 MG) BY MOUTH THREE TIMES DAILY 03/28/21   Vickki Hearing, MD  hydrochlorothiazide (HYDRODIURIL) 25 MG tablet Take 25 mg by mouth daily.  06/05/18   [provider]  naloxone [facility]) nasal spray 4 mg/0.1 mL  as needed. overdose 10/19/21   [provider]  oxyCODONE-acetaminophen (PERCOCET/ROXICET) 5-325 MG tablet Take 1 tablet by mouth every 6 (six) hours as needed for severe pain.    [provider]  pantoprazole (PROTONIX) 40 MG tablet Take 1 tablet by mouth daily. 05/09/18   [provider]  Sod Picosulfate-Mag Ox-Cit Acd (CLENPIQ) 10-3.5-12 MG-GM -GM/175ML SOLN Take 1 kit by mouth as directed. 01/11/22   Rourk, Gerrit Friends, MD    PRN MEDs: acetaminophen **OR** acetaminophen, bisacodyl, haloperidol lactate, hydrALAZINE, HYDROmorphone (DILAUDID) injection, ipratropium, levalbuterol, naloxone, ondansetron  **OR** ondansetron (ZOFRAN) IV, oxyCODONE, senna-docusate, sodium phosphate, zolpidem  Past Medical History:  Diagnosis Date   Anxiety    Arthritis    Chronic back pain    Chronic knee pain    GERD (gastroesophageal reflux disease)    occ   Hepatic fibrosis    Secondary to hepatitis C; F3/F4 on elastography in 2018   Hepatitis C 2018   Genotype 1a s/p treatment with Mavyret with sustained SVR.   Hypertension    "Dr Sudie Bailey took me off meds"  diet control   Lumbar radiculopathy    Pre-diabetes     Past Surgical History:  Procedure Laterality Date   BACK SURGERY     CARPAL TUNNEL RELEASE Right 06/25/2013   Procedure: CARPAL TUNNEL RELEASE;  Surgeon: Vickki Hearing, MD;  Location: [facility];  Service: Orthopedics;  Laterality: Right;   CARPAL TUNNEL RELEASE Left 07/25/2013   Procedure: LEFT CARPAL TUNNEL RELEASE;  Surgeon: Vickki Hearing, MD;  Location: [facility];  Service: Orthopedics;  Laterality: Left;   COLONOSCOPY WITH PROPOFOL N/A 04/05/2017   Surgeon: Corbin Ade, MD; normal exam. Recommended 5 year repeat due to family history   COLONOSCOPY WITH PROPOFOL N/A 02/13/2022   Procedure: COLONOSCOPY WITH PROPOFOL;  Surgeon: Corbin Ade, MD;  Location: [facility];  Service: Endoscopy;  Laterality: N/A;  9:15a,. asa 2   ELBOW SURGERY Left    ESOPHAGOGASTRODUODENOSCOPY (EGD) WITH PROPOFOL N/A 02/13/2022   Procedure: ESOPHAGOGASTRODUODENOSCOPY (EGD) WITH PROPOFOL;  Surgeon: Corbin Ade, MD;  Location: [facility];  Service: Endoscopy;  Laterality: N/A;   FOOT SURGERY Right    HERNIA REPAIR Right    inguinal- age 71   I & D EXTREMITY Left 06/12/2021   Procedure: IRRIGATION AND DEBRIDEMENT;  Surgeon: Bradly Bienenstock, MD;  Location: [facility] OR;  Service: Orthopedics;  Laterality: Left;   KNEE ARTHROSCOPY WITH MEDIAL MENISECTOMY Left 12/07/2016   Procedure: KNEE ARTHROSCOPY WITH MEDIAL MENISECTOMY;  Surgeon: Vickki Hearing, MD;  Location: [facility];  Service: Orthopedics;   Laterality: Left;   KNEE SURGERY     left elbow     LUMBAR LAMINECTOMY/DECOMPRESSION MICRODISCECTOMY Left 10/06/2013   Procedure: Left Lumbar Three-four microdiskectomy;  Surgeon: Cristi Loron, MD;  Location: [facility];  Service: Neurosurgery;  Laterality: Left;  Left Lumbar Three-four microdiskectomy   OPEN REDUCTION INTERNAL FIXATION (ORIF) HAND Left 06/12/2021   Procedure: OPEN REDUCTION INTERNAL FIXATION (ORIF) HAND;  Surgeon: Bradly Bienenstock, MD;  Location: [facility] OR;  Service: Orthopedics;  Laterality: Left;   POLYPECTOMY  02/13/2022   Procedure: POLYPECTOMY;  Surgeon: Corbin Ade, MD;  Location: [facility];  Service: Endoscopy;;   REPAIR OF RUPTURED PATELLA LIGAMENT Left 05/03/2020   Procedure: LEFT KNEE PATELLECTOMY;  Surgeon: Gean Birchwood, MD;  Location: [facility];  Service: Orthopedics;  Laterality: Left;   right foot     forgein body removal   right knee  orif right patella [place] 1993   SEPTOPLASTY     TOTAL KNEE ARTHROPLASTY Left 02/18/2019   Procedure: TOTAL KNEE ARTHROPLASTY;  Surgeon: Vickki Hearing, MD;  Location: [facility];  Service: Orthopedics;  Laterality: Left;     reports that he has been smoking cigarettes. He has a 15 pack-year smoking history. He quit smokeless tobacco use about 32 years ago.  His smokeless tobacco use included chew. He reports current drug use. Frequency: 2.00 times per week. Drug: Marijuana. He reports that he does not drink alcohol.   Family History  Problem Relation Age of Onset   Heart disease Other    Arthritis Other    Cancer Other    Asthma Other    Diabetes Other    Kidney disease Other    Colon cancer Father 9    Physical Exam:   Vitals:   07/18/23 1156 07/18/23 1200 07/18/23 1238 07/18/23 1300  BP:  (!) 186/108 (!) 172/85 (!) 161/88  Pulse:  (!) 36 (!) 39 (!) 37  Resp:      Temp: 97.9 F (36.6 C)     TempSrc: Rectal     SpO2:  98% 99% 99%  Weight:      Height:       Constitutional: NAD, calm,  comfortable Eyes: PERRL, lids and conjunctivae normal ENMT: Mucous membranes are moist. Posterior pharynx clear of any exudate or lesions.Normal dentition.  Neck: normal, supple, no masses, no thyromegaly Respiratory: clear to auscultation bilaterally, no wheezing, no crackles. Normal respiratory effort. No accessory muscle use.  Cardiovascular: Regular rate and rhythm, no murmurs / rubs / gallops. No extremity edema. 2+ pedal pulses. No carotid bruits.  Abdomen: no tenderness, no masses palpated. No hepatosplenomegaly. Bowel sounds positive.  Musculoskeletal: no clubbing / cyanosis. No joint deformity upper and lower extremities. Good ROM, no contractures. Normal muscle tone.  Neurologic: CN II-XII grossly intact. Sensation intact, DTR normal. Strength 5/5 in all 4.  Psychiatric: Stable mood, cooperative, denies any homicidal suicidal ideation plan Normal judgment and insight. Alert and oriented x 3. Normal mood. Denies of having any suicidal or homicidal ideation or plan Skin: no rashes, lesions, ulcers. No induration      Labs on admission:    I have personally reviewed following labs and imaging studies  CBC: Recent Labs  Lab 07/18/23 0403  WBC 6.9  HGB 14.1  HCT 40.1  MCV 89.7  PLT 281   Basic Metabolic Panel: Recent Labs  Lab 07/18/23 0403 07/18/23 0957  NA 134*  --   K 3.9  --   CL 104  --   CO2 22  --   GLUCOSE 166*  --   BUN 20  --   CREATININE 0.85  --   CALCIUM 8.9  --   MG  --  2.0  PHOS  --  4.2   GFR: Estimated Creatinine Clearance: 116.9 mL/min (by C-G formula based on SCr of 0.85 mg/dL). Liver Function Tests: Recent Labs  Lab 07/18/23 0403  AST 13*  ALT 13  ALKPHOS 35*  BILITOT 1.1  PROT 6.9  ALBUMIN 3.8    CBG: Recent Labs  Lab 07/18/23 0416  GLUCAP 175*    Urine analysis:    Component Value Date/Time   COLORURINE YELLOW 05/17/2015 1805   APPEARANCEUR CLEAR 05/17/2015 1805   LABSPEC >1.030 (H) 05/17/2015 1805   PHURINE 6.0  05/17/2015 1805   GLUCOSEU NEGATIVE 05/17/2015 1805   HGBUR NEGATIVE 05/17/2015 1805   BILIRUBINUR NEGATIVE  05/17/2015 1805   KETONESUR NEGATIVE 05/17/2015 1805   PROTEINUR NEGATIVE 05/17/2015 1805   UROBILINOGEN 1.0 04/30/2012 1805   NITRITE NEGATIVE 05/17/2015 1805   LEUKOCYTESUR NEGATIVE 05/17/2015 1805    Last A1C:  Lab Results  Component Value Date   HGBA1C 5.0 04/29/2020     Radiologic Exams on Admission:   CT Head Wo Contrast Result Date: 07/18/2023 CLINICAL DATA:  53 year old male with altered mental status, clonidine overdose. EXAM: CT HEAD WITHOUT CONTRAST TECHNIQUE: Contiguous axial images were obtained from the base of the skull through the vertex without intravenous contrast. RADIATION DOSE REDUCTION: This exam was performed according to the departmental dose-optimization program which includes automated exposure control, adjustment of the mA and/or kV according to patient size and/or use of iterative reconstruction technique. COMPARISON:  Head CT 02/26/2013. FINDINGS: Brain: Cerebral volume remains normal. No midline shift, ventriculomegaly, mass effect, evidence of mass lesion, intracranial hemorrhage or evidence of cortically based acute infarction. Gray-white matter differentiation is within normal limits throughout the brain. Vascular: No suspicious intracranial vascular hyperdensity. Mild Calcified atherosclerosis at the skull base. Skull: Stable since 2014, negative. Sinuses/Orbits: Visualized paranasal sinuses and mastoids are stable and well aerated. Other: Visualized orbits and scalp soft tissues are within normal limits. IMPRESSION: Stable since 2014 and normal noncontrast Head CT. Electronically Signed   By: Odessa Fleming M.D.   On: 07/18/2023 10:26    EKG:   Independently reviewed.  Orders placed or performed during the hospital encounter of 07/18/23   EKG 12-Lead   EKG 12-Lead   ED EKG   ED EKG   EKG 12-Lead    ---------------------------------------------------------------------------------------------------------------------------------------    Assessment / Plan:   Principal Problem:   Drug overdose Active Problems:   Suicide attempt (HCC)   Substance induced mood disorder (HCC)   Tobacco abuse   Gastroesophageal reflux disease   Coronary artery disease involving native coronary artery of native heart without angina pectoris   Essential hypertension   Hepatic fibrosis   Major depressive disorder   Assessment and Plan: * Drug overdose - Overdose on Klonopin -Close monitoring -Discontinuing Klonopin -Psych is initiated Remeron 7.5 mg nightly  Substance induced mood disorder (HCC) - Extensive history of substance abuse currently UDS positive for cocaine and marijuana -Patient took excessive number of tablets clonidine   Suicide attempt [facility]) - Currently patient denied having any homicidal suicidal ideation - Overdosed on clonidine ? Per ED report he took  100 tablets of 0.1 mg. -Poison control was called, patient to be observed closely for approximately 8 hours Patient remained somnolent, bradycardic, hypothermic-otherwise hemodynamically stable -Will continue to monitor closely -Suicide precaution -Psych consulted, recommending inpatient psych admission once medically stable-patient is agreeable  Tobacco abuse - Patient has been counseled regarding tobacco use and abuse -NicoDerm patch has been provided  Essential hypertension BP currently elevated, resuming home medication of hydrochlorothiazide Clonidine is not listed on his home med -will review  Coronary artery disease involving native coronary artery of native heart without angina pectoris - Continue aspirin  Gastroesophageal reflux disease - Continue PPI  Major depressive disorder Major depressive disorder with anxiety Current reviewed med rec. -not updated: Revealing: Celexa 2 different doses 40 and 20 mg,  Valium 10 mg twice a day, Neurontin 100 mg daily  -Will continue Celexa 20 mg p.o. daily, psychiatrist added Remeron 7.5 mg p.o. nightly  Hepatic fibrosis - Monitoring LFTs, no significant elevation    Latest Ref Rng & Units 07/18/2023    4:03 AM 04/29/2020  2:09 PM 03/21/2017   12:36 PM  Hepatic Function  Total Protein 6.5 - 8.1 g/dL 6.9  7.8  7.4   Albumin 3.5 - 5.0 g/dL 3.8  4.8    AST 15 - 41 U/L 13  18  19    ALT 0 - 44 U/L 13  15  20    Alk Phosphatase 38 - 126 U/L 35  35    Total Bilirubin 0.0 - 1.2 mg/dL 1.1  0.8  0.7   -Will avoid hepatotoxins      Consults called: TTS/psych -------------------------------------------------------------------------------------------------------------------------------------------- DVT prophylaxis:  heparin injection 5,000 Units Start: 07/18/23 1400 TED hose Start: 07/18/23 0935 SCDs Start: 07/18/23 0935   Code Status:   Code Status: Full Code   Admission status: Patient will be admitted as Observation, with a greater than 2 midnight length of stay. Level of care: Stepdown   Family Communication:  none at bedside  (The above findings and plan of care has been discussed with patient in detail, the patient expressed understanding and agreement of above plan)  --------------------------------------------------------------------------------------------------------------------------------------------------  Disposition Plan:  Anticipated 1-2 days Status is: Observation The patient remains OBS appropriate and will d/c before 2 midnights.  Anticipated patient to be admitted to psych unit in next 24 hours     ----------------------------------------------------------------------------------------------------------------------------------------------------  Time spent:  90  Min.  Was spent seeing and evaluating the patient, reviewing all medical records, drawn plan of care.  SIGNED: Kendell Bane, MD, FHM. FAAFP. [place] -  [facility], Pager  (Please use [email or web address] to page/ or secure chat through epic) If 7PM-7AM, please contact night-coverage [email or web address],  07/18/2023, 1:16 PM

## 2023-07-18 NOTE — Assessment & Plan Note (Signed)
 Continue aspirin

## 2023-07-18 NOTE — Assessment & Plan Note (Signed)
-   Patient has been counseled regarding tobacco use and abuse -NicoDerm patch has been provided

## 2023-07-18 NOTE — ED Triage Notes (Addendum)
 Pt bib EMS after relative in another state received a call from pt stating that he had taken an overdose of his clonidine 0.1mg  tabs around 8pm. Initially, he reported to EMS that he took 100 of his clonidine tabs then changed to "a couple" and then to he "doesn't really know". Pt admits to being depressed after his wife "left him". Pt denies SI during triage. Pt also reports using cocaine tonight. Pt states he fell around 2 am and hit the back of his head on the toilet.

## 2023-07-18 NOTE — ED Provider Notes (Signed)
 I have personally seen this at the bedside, he is persistently bradycardic from a rate of around 30-40, blood pressure is normal, mildly somnolent but arousable, he complains to me of having some dizziness and some difficulty with ambulation for couple of days, I have scanned his head which shows that he has no acute findings on my interpretation.  His labs are rather unremarkable, poison control requested that the patient be monitored for a short number of hours but the amount of medication that he took and the half-life of the drug along with his persistent bradycardia and now finding him to be hypothermic mandates that the patient will need to be admitted to the hospital.  I discussed this with the hospitalist Dr. Flossie Dibble at 9:30 AM and he is willing to see this patient and admit for observation.  Patient otherwise appears hemodynamically stable with regards to his blood pressure and mental status at this time.  Psychiatry can see the patient as he is medically cleared from a level of alertness standpoint.   Jamie Hong, MD 07/18/23 [phone]

## 2023-07-18 NOTE — ED Provider Notes (Signed)
 [place] EMERGENCY DEPARTMENT AT [facility] Provider Note   CSN: [id number] Arrival date & time: 07/18/23  0348     History  Chief Complaint  Patient presents with   Drug Overdose    Jamie [name] is a 53 y.o. male.  Patient is a 53 year old male brought by EMS after an overdose of his clonidine.  I am told that Jamie [name] took 100 of his 0.1 mg tablets.  Patient not very forthcoming about why Jamie [name] did this, but does admit to being depressed recently.  Jamie [name] denies alcohol or other drug use.  Patient bradycardic with heart rate in the 30s upon presentation.  Jamie [name] has no other complaints.       Home Medications Prior to Admission medications   Medication Sig Start Date End Date Taking? Authorizing Provider  aspirin EC 81 MG tablet Take 1 tablet (81 mg total) by mouth daily. Swallow whole. 12/28/21   Chandrasekhar, Mahesh A, MD  citalopram (CELEXA) 20 MG tablet Take 20 mg by mouth daily. 09/09/21   [provider]  citalopram (CELEXA) 40 MG tablet Take 40 mg by mouth daily. 04/20/21   [provider]  diazepam (VALIUM) 10 MG tablet Take 10 mg by mouth 2 (two) times daily.  05/19/18   [provider]  diclofenac Sodium (VOLTAREN) 1 % GEL Apply 1 application topically in the morning, at noon, in the evening, and at bedtime. 05/20/21   [provider]  gabapentin (NEURONTIN) 100 MG capsule TAKE 1 CAPSULE(100 MG) BY MOUTH THREE TIMES DAILY 03/28/21   Vickki Hearing, MD  hydrochlorothiazide (HYDRODIURIL) 25 MG tablet Take 25 mg by mouth daily.  06/05/18   [provider]  naloxone [facility]) nasal spray 4 mg/0.1 mL as needed. overdose 10/19/21   [provider]  oxyCODONE-acetaminophen (PERCOCET/ROXICET) 5-325 MG tablet Take 1 tablet by mouth every 6 (six) hours as needed for severe pain.    [provider]  pantoprazole (PROTONIX) 40 MG tablet Take 1 tablet by mouth daily. 05/09/18   [provider]  Sod Picosulfate-Mag  Ox-Cit Acd (CLENPIQ) 10-3.5-12 MG-GM -GM/175ML SOLN Take 1 kit by mouth as directed. 01/11/22   Rourk, Gerrit Friends, MD      Allergies    Doxepin, Dronabinol, Tramadol, and Trazodone    Review of Systems   Review of Systems  All other systems reviewed and are negative.   Physical Exam Updated Vital Signs BP (!) 160/96   Pulse (!) 35   Temp 98.4 F (36.9 C) (Oral)   Resp 10   Ht 5\' 11"  (1.803 m)   Wt 95 kg   SpO2 98%   BMI 29.21 kg/m  Physical Exam Vitals and nursing note reviewed.  Constitutional:      General: Jamie [name] is not in acute distress.    Appearance: Jamie [name] is well-developed. Jamie [name] is not diaphoretic.  HENT:     Head: Normocephalic and atraumatic.  Cardiovascular:     Rate and Rhythm: Regular rhythm. Bradycardia present.     Heart sounds: No murmur heard.    No friction rub.  Pulmonary:     Effort: Pulmonary effort is normal. No respiratory distress.     Breath sounds: Normal breath sounds. No wheezing or rales.  Abdominal:     General: Bowel sounds are normal. There is no distension.     Palpations: Abdomen is soft.     Tenderness: There is no abdominal tenderness.  Musculoskeletal:        General:  Normal range of motion.     Cervical back: Normal range of motion and neck supple.  Skin:    General: Skin is warm and dry.  Neurological:     Mental Status: Jamie [name] is alert and oriented to person, place, and time.     Coordination: Coordination normal.     ED Results / Procedures / Treatments   Labs (all labs ordered are listed, but only abnormal results are displayed) Labs Reviewed  CBC  COMPREHENSIVE METABOLIC PANEL WITH GFR  ETHANOL  SALICYLATE LEVEL  ACETAMINOPHEN LEVEL  RAPID URINE DRUG SCREEN, HOSP PERFORMED  CBG MONITORING, ED    EKG EKG Interpretation Date/Time:  Wednesday July 18 2023 03:56:11 EDT Ventricular Rate:  34 PR Interval:  166 QRS Duration:  98 QT Interval:  599 QTC Calculation: 451 R Axis:   42  Text Interpretation: Sinus bradycardia  Confirmed by Geoffery Lyons ([id number]) on 07/18/2023 6:30:51 AM  Radiology No results found.  Procedures Procedures    Medications Ordered in ED Medications - No data to display  ED Course/ Medical Decision Making/ A&P  Patient is a 53 year old male brought by EMS after an intentional overdose of clonidine.  Patient reports depression recently, but stopped short it saying this was an attempt to end his life.  Patient arrives here bradycardic, but with otherwise stable vital signs.  Physical examination basically unremarkable.  Laboratory studies obtained including CBC, CMP, EtOH, salicylate, and acetaminophen levels.  Laboratory all basically unremarkable.  Poison control was contacted and recommends atropine for severe bradycardia, IV fluids, and a 6-hour observation..  The overdose was taken at 1130 last night and patient remains bradycardic.  Jamie [name] is however, mentating appropriately and I do not feel as though intervention is necessary other than IV fluids at this time.  Patient appears medically cleared for TTS evaluation.  CRITICAL CARE Performed by: Geoffery Lyons Total critical care time: 35 minutes Critical care time was exclusive of separately billable procedures and treating other patients. Critical care was necessary to treat or prevent imminent or life-threatening deterioration. Critical care was time spent personally by me on the following activities: development of treatment plan with patient and/or surrogate as well as nursing, discussions with consultants, evaluation of patient's response to treatment, examination of patient, obtaining history from patient or surrogate, ordering and performing treatments and interventions, ordering and review of laboratory studies, ordering and review of radiographic studies, pulse oximetry and re-evaluation of patient's condition.   Final Clinical Impression(s) / ED Diagnoses Final diagnoses:  None    Rx / DC Orders ED Discharge Orders      None         Geoffery Lyons, MD 07/18/23 [phone]

## 2023-07-18 NOTE — Assessment & Plan Note (Deleted)
-   Overdosed on clonidine -Poison control was called, patient to be observed closely for approximately 8 hours Patient remained somnolent, bradycardic, hypothermic-otherwise hemodynamically stable -Will continue to monitor closely

## 2023-07-18 NOTE — ED Notes (Signed)
 Called and spoke with poison control who advises to obs until pt mental status is at baseline and HR and BP are WNL.

## 2023-07-18 NOTE — ED Notes (Signed)
 Report given to [facility].

## 2023-07-18 NOTE — Progress Notes (Signed)
   07/18/23 1114  TOC Brief Assessment  Insurance and Status Reviewed  Patient has primary care physician No (PCP list added to AVS)  Home environment has been reviewed Home  Prior level of function: Independent  Prior/Current Home Services No current home services  Social Drivers of Health Review SDOH reviewed no interventions necessary  Readmission risk has been reviewed Yes    Patient in ED, waiting on TTS consult, no PCP, list added to AVS.   Transition of Care Department (TOC) has reviewed patient and no TOC needs have been identified at this time. We will continue to monitor patient advancement through interdisciplinary progression rounds. If new patient transition needs arise, please place a TOC consult.

## 2023-07-18 NOTE — Assessment & Plan Note (Addendum)
-   Extensive history of substance abuse currently UDS positive for cocaine and marijuana -Patient took excessive number of tablets clonidine

## 2023-07-18 NOTE — Consult Note (Signed)
 [facility] Psychiatric Consult Initial  Patient Name: [name]  MRN: [id number]  DOB: 06-29-1970  Consult Order details:  Orders (From admission, onward)     Start     Ordered   07/18/23 0553  CONSULT TO CALL ACT TEAM       Ordering Provider: Geoffery Lyons, MD  Provider:  (Not yet assigned)  Question:  Reason for Consult?  Answer:  Overdose   07/18/23 0552             Mode of Visit: Tele-visit Virtual Statement:TELE PSYCHIATRY ATTESTATION & CONSENT As the provider for this telehealth consult, I attest that I verified the patient's identity using two separate identifiers, introduced myself to the patient, provided my credentials, disclosed my location, and performed this encounter via a HIPAA-compliant, real-time, face-to-face, two-way, interactive audio and video platform and with the full consent and agreement of the patient (or guardian as applicable.) Patient physical location: [facility]. Telehealth provider physical location: home office in state of [place].   Video start time: 0830 Video end time: 0850    Psychiatry Consult Evaluation  Service Date: July 18, 2023 LOS:  LOS: 0 days  Chief Complaint suicide attempt  Primary Psychiatric Diagnoses  Suicide attempt 2.  Substance induced mood disorder 3.    Assessment  Jamie [name] is a 53 y.o. male admitted: Presented to the EDfor 07/18/2023  3:48 AM for suicide attempt by overdose of Clonidine. He carries the psychiatric diagnoses of MDD and anxiety.  On initial examination, patient is pleasant, cooperate. Please see plan below for detailed recommendations.   Diagnoses:  Active Hospital problems: Principal Problem:   Suicide attempt [facility]) Active Problems:   Substance induced mood disorder (HCC)    Plan   ## Psychiatric Medication Recommendations:  - Start Remeron 7.5 mg at bedtime once medically cleared   ## Medical Decision Making Capacity: Not specifically addressed in this encounter   ## Disposition:-- We  recommend inpatient psychiatric hospitalization when medically cleared. Patient is under voluntary admission status at this time; please IVC if attempts to leave hospital.  ## Behavioral / Environmental: - No specific recommendations at this time.     ## Safety and Observation Level:  - Based on my clinical evaluation, I estimate the patient to be at low risk of self harm in the current setting. - At this time, we recommend  routine. This decision is based on my review of the chart including patient's history and current presentation, interview of the patient, mental status examination, and consideration of suicide risk including evaluating suicidal ideation, plan, intent, suicidal or self-harm behaviors, risk factors, and protective factors. This judgment is based on our ability to directly address suicide risk, implement suicide prevention strategies, and develop a safety plan while the patient is in the clinical setting. Please contact our team if there is a concern that risk level has changed.  CSSR Risk Category:C-SSRS RISK CATEGORY: No Risk  Suicide Risk Assessment: Patient has following modifiable risk factors for suicide: untreated depression, social isolation, and medication noncompliance, which we are addressing by inpatient admission. Patient has following non-modifiable or demographic risk factors for suicide: male gender Patient has the following protective factors against suicide: Access to outpatient mental health care and Cultural, spiritual, or religious beliefs that discourage suicide  Thank you for this consult request. Recommendations have been communicated to the primary team.  We will recommend inpatient treatment at this time.   [name], NP  History of Present Illness  Relevant Aspects of Hospital ED Course:  Admitted on 07/18/2023 Patient is a 53 year old male brought by EMS after an overdose of his clonidine. I am told that he took 100 of his 0.1 mg  tablets. Patient not very forthcoming about why he did this, but does admit to being depressed recently. He denies alcohol or other drug use. Patient bradycardic with heart rate in the 30s upon presentation. He has no other complaints.   Patient Report:  Pt seen at [facility] ED for psychiatric assessment. Pt does have depressed mood and affect, crying at random times through out assessment. 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 He reports struggling with the grief, as he now feels very lonely and isolated. He does not have a lot of social support. He does have siblings, but states they don't speak with him, and only came around for a "pay out" when his mother died. He has been using drugs to cope with the grief, he has been using cocaine a few times per week, most recent use yesterday. He also using marijuana almost daily, also used yesterday.   When I try and talk with patient about the overdose he is very guarded, and appears to be withholding of information. He continues to state he doesn't know how many he took, he does not know why he took them, and continues to state it was not a suicide attempt. When asked if patient was trying to take the pills to abuse them and maybe try to find a high he stated "no." Again, patient would not explain why he had such a heavy ingestion of pills las night. He denies SI. Denies any previous suicide attempts. Denies HI. Denies AVH. He does report previous mental health hx of depression, anxiety, and panic attacks. He does not see a therapist but would like to start.   Ultimately, I informed patient that with the information we do have, it appears patient had a suicide attempt, and will need inpatient treatment. He originally was opposed, stating he does not need to go and he did not try and kill himself. When I asked patient again to explain what happened last night if it was not a suicide attempt he stated "well, maybe I should  go." After further discussion, patient is agreeable with inpatient treatment. However, if he were to try and leave AMA he would meet criteria for IVC.     Review of Systems  Psychiatric/Behavioral:  Positive for depression and substance abuse. The patient has insomnia.      Psychiatric and Social History  Psychiatric History:  Information collected from patient, chart  Prev Dx/Sx: mdd, GAD Current Psych Provider: denies Home Meds (current): clonidine Previous Med Trials: klonopin Therapy: denies  Prior Psych Hospitalization: denies  Prior Self Harm: no previous suicide attempts prior to this visit Prior Violence: denies   Social History:  Developmental Hx: wdl Living Situation: lives alone Spiritual Hx: yes Access to weapons/lethal means: denies   Substance History Alcohol: denies  Tobacco: occasional Illicit drugs: cocaine, thc   Exam Findings  Physical Exam:  Vital Signs:  Temp:  [94.8 F (34.9 C)-98.4 F (36.9 C)] 94.8 F (34.9 C) (04/09 0739) Pulse Rate:  [27-40] 28 (04/09 0826) Resp:  [10-17] 12 (04/09 0826) BP: (116-173)/(78-138) 173/102 (04/09 0826) SpO2:  [98 %-100 %] 99 % (04/09 0826) Weight:  [95 kg] 95 kg (04/09 0358) Blood pressure (!) 173/102, pulse Marland Kitchen)  28, temperature (!) 94.8 F (34.9 C), temperature source Rectal, resp. rate 12, height 5\' 11"  (1.803 m), weight 95 kg, SpO2 99%. Body mass index is 29.21 kg/m.  Physical Exam Vitals and nursing note reviewed.  Neurological:     Mental Status: He is alert and oriented to person, place, and time.     Mental Status Exam: General Appearance: Fairly Groomed  Orientation:  Full (Time, Place, and Person)  Memory:  Immediate;   Fair Recent;   Fair  Concentration:  Concentration: Fair  Recall:  Good  Attention  Fair  Eye Contact:  Good  Speech:  Clear and Coherent  Language:  Good  Volume:  Normal  Mood: "I've been better"  Affect:  Depressed and Tearful  Thought Process:  Goal Directed   Thought Content:  WDL  Suicidal Thoughts:  No  Homicidal Thoughts:  No  Judgement:  Fair  Insight:  Lacking  Psychomotor Activity:  Normal  Akathisia:  No  Fund of Knowledge:  Fair      Assets:  Communication Skills Desire for Improvement Housing Leisure Time Physical Health Social Support  Cognition:  WNL  ADL's:  Intact  AIMS (if indicated):        Other History   These have been pulled in through the EMR, reviewed, and updated if appropriate.  Family History:  The patient's family history includes Arthritis in an other family member; Asthma in an other family member; Cancer in an other family member; Colon cancer (age of onset: 49) in his father; Diabetes in an other family member; Heart disease in an other family member; Kidney disease in an other family member.  Medical History: Past Medical History:  Diagnosis Date  . Anxiety   . Arthritis   . Chronic back pain   . Chronic knee pain   . GERD (gastroesophageal reflux disease)    occ  . 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 Hepatitis C 2018   Genotype 1a s/p treatment with Mavyret with sustained SVR.  Marland Kitchen Hypertension    "Dr Sudie Bailey took me off meds"  diet control  . Lumbar radiculopathy   . Pre-diabetes     Surgical History: Past Surgical History:  Procedure Laterality Date  . BACK SURGERY    . CARPAL TUNNEL RELEASE Right 06/25/2013   Procedure: CARPAL TUNNEL RELEASE;  Surgeon: Vickki Hearing, MD;  Location: [facility];  Service: Orthopedics;  Laterality: Right;  . CARPAL TUNNEL RELEASE Left 07/25/2013   Procedure: LEFT CARPAL TUNNEL RELEASE;  Surgeon: Vickki Hearing, MD;  Location: [facility];  Service: Orthopedics;  Laterality: Left;  . COLONOSCOPY WITH PROPOFOL N/A 04/05/2017   Surgeon: Corbin Ade, MD; normal exam. Recommended 5 year repeat due to family history  . COLONOSCOPY WITH PROPOFOL N/A 02/13/2022   Procedure: COLONOSCOPY WITH PROPOFOL;  Surgeon:  Corbin Ade, MD;  Location: [facility];  Service: Endoscopy;  Laterality: N/A;  9:15a,. asa 2  . ELBOW SURGERY Left   . ESOPHAGOGASTRODUODENOSCOPY (EGD) WITH PROPOFOL N/A 02/13/2022   Procedure: ESOPHAGOGASTRODUODENOSCOPY (EGD) WITH PROPOFOL;  Surgeon: Corbin Ade, MD;  Location: [facility];  Service: Endoscopy;  Laterality: N/A;  . FOOT SURGERY Right   . HERNIA REPAIR Right    inguinal- age 83  . I & D EXTREMITY Left 06/12/2021   Procedure: IRRIGATION AND DEBRIDEMENT;  Surgeon: Bradly Bienenstock, MD;  Location: [facility] OR;  Service: Orthopedics;  Laterality: Left;  .  KNEE ARTHROSCOPY WITH MEDIAL MENISECTOMY Left 12/07/2016   Procedure: KNEE ARTHROSCOPY WITH MEDIAL MENISECTOMY;  Surgeon: Vickki Hearing, MD;  Location: [facility];  Service: Orthopedics;  Laterality: Left;  . KNEE SURGERY    . left elbow    . LUMBAR LAMINECTOMY/DECOMPRESSION MICRODISCECTOMY Left 10/06/2013   Procedure: Left Lumbar Three-four microdiskectomy;  Surgeon: Cristi Loron, MD;  Location: [facility];  Service: Neurosurgery;  Laterality: Left;  Left Lumbar Three-four microdiskectomy  . OPEN REDUCTION INTERNAL FIXATION (ORIF) HAND Left 06/12/2021   Procedure: OPEN REDUCTION INTERNAL FIXATION (ORIF) HAND;  Surgeon: Bradly Bienenstock, MD;  Location: [facility] OR;  Service: Orthopedics;  Laterality: Left;  . POLYPECTOMY  02/13/2022   Procedure: POLYPECTOMY;  Surgeon: Corbin Ade, MD;  Location: [facility];  Service: Endoscopy;;  . REPAIR OF RUPTURED PATELLA LIGAMENT Left 05/03/2020   Procedure: LEFT KNEE PATELLECTOMY;  Surgeon: Gean Birchwood, MD;  Location: [facility];  Service: Orthopedics;  Laterality: Left;  . right foot     forgein body removal  . right knee  orif right patella Ke[place] 1993  . SEPTOPLASTY    . TOTAL KNEE ARTHROPLASTY Left 02/18/2019   Procedure: TOTAL KNEE ARTHROPLASTY;  Surgeon: Vickki Hearing, MD;  Location: [facility];  Service: Orthopedics;  Laterality: Left;     Medications:  No current  facility-administered medications for this encounter.  Current Outpatient Medications:  .  aspirin EC 81 MG tablet, Take 1 tablet (81 mg total) by mouth daily. Swallow whole., Disp: 90 tablet, Rfl: 3 .  citalopram (CELEXA) 20 MG tablet, Take 20 mg by mouth daily., Disp: , Rfl:  .  citalopram (CELEXA) 40 MG tablet, Take 40 mg by mouth daily., Disp: , Rfl:  .  diazepam (VALIUM) 10 MG tablet, Take 10 mg by mouth 2 (two) times daily. , Disp: , Rfl:  .  diclofenac Sodium (VOLTAREN) 1 % GEL, Apply 1 application topically in the morning, at noon, in the evening, and at bedtime., Disp: , Rfl:  .  gabapentin (NEURONTIN) 100 MG capsule, TAKE 1 CAPSULE(100 MG) BY MOUTH THREE TIMES DAILY, Disp: 60 capsule, Rfl: 5 .  hydrochlorothiazide (HYDRODIURIL) 25 MG tablet, Take 25 mg by mouth daily. , Disp: , Rfl:  .  naloxone (NARCAN) nasal spray 4 mg/0.1 mL, as needed. overdose, Disp: , Rfl:  .  oxyCODONE-acetaminophen (PERCOCET/ROXICET) 5-325 MG tablet, Take 1 tablet by mouth every 6 (six) hours as needed for severe pain., Disp: , Rfl:  .  pantoprazole (PROTONIX) 40 MG tablet, Take 1 tablet by mouth daily., Disp: , Rfl:  .  Sod Picosulfate-Mag Ox-Cit Acd (CLENPIQ) 10-3.5-12 MG-GM -GM/175ML SOLN, Take 1 kit by mouth as directed., Disp: 350 mL, Rfl: 0  Allergies: Allergies  Allergen Reactions  . Doxepin   . Dronabinol   . Tramadol   . Trazodone     [name], NP

## 2023-07-18 NOTE — Assessment & Plan Note (Signed)
 Major depressive disorder with anxiety Current reviewed med rec. -not updated: Revealing: Celexa 2 different doses 40 and 20 mg, Valium 10 mg twice a day, Neurontin 100 mg daily  -Will continue Celexa 20 mg p.o. daily, psychiatrist added Remeron 7.5 mg p.o. nightly

## 2023-07-18 NOTE — ED Notes (Signed)
 Dr Flossie Dibble aware needing telesitter order due to order for suicide precautions.

## 2023-07-18 NOTE — Discharge Instructions (Signed)

## 2023-07-18 NOTE — Assessment & Plan Note (Addendum)
 BP currently elevated, resuming home medication of hydrochlorothiazide Clonidine is not listed on his home med -will review

## 2023-07-18 NOTE — Assessment & Plan Note (Signed)
-   Overdose on Klonopin -Close monitoring -Discontinuing Klonopin -Psych is initiated Remeron 7.5 mg nightly

## 2023-07-18 NOTE — Progress Notes (Signed)
 LCSW Progress Note:   Jamie [name]. Lemme  MRN: [id number]  07/18/2023 8:38 PM  [name], NP, recommends inpatient psychiatric hospitalization once the patient is medically cleared. The patient is currently under voluntary admission status. If the patient attempts to leave, involuntary commitment (IVC) will be initiated. At 08:38 AM on 07/18/2023, the [facility]) was notified of the patient's need for a bed upon medical clearance.

## 2023-07-18 NOTE — Plan of Care (Signed)

## 2023-07-18 NOTE — BH Assessment (Signed)
 Called and spoke with RN Neysa Bonito to arrange for TTS assessment. Per RN, pt "has a temperature problem" and he is getting ready to have a CT scan for his head injury. RN stated he would have to be assessed later.   Derricka Mertz T. Jimmye Norman, MS, [facility], [facility] Triage Specialist [facility]

## 2023-07-18 NOTE — Assessment & Plan Note (Addendum)
-   Currently patient denied having any homicidal suicidal ideation - Overdosed on clonidine ? Per ED report he took  100 tablets of 0.1 mg. -Poison control was called, patient to be observed closely for approximately 8 hours Patient remained somnolent, bradycardic, hypothermic-otherwise hemodynamically stable -Will continue to monitor closely -Suicide precaution -Psych consulted, recommending inpatient psych admission once medically stable-patient is agreeable

## 2023-07-18 NOTE — Assessment & Plan Note (Addendum)
-   Monitoring LFTs, no significant elevation    Latest Ref Rng & Units 07/18/2023    4:03 AM 04/29/2020    2:09 PM 03/21/2017   12:36 PM  Hepatic Function  Total Protein 6.5 - 8.1 g/dL 6.9  7.8  7.4   Albumin 3.5 - 5.0 g/dL 3.8  4.8    AST 15 - 41 U/L 13  18  19    ALT 0 - 44 U/L 13  15  20    Alk Phosphatase 38 - 126 U/L 35  35    Total Bilirubin 0.0 - 1.2 mg/dL 1.1  0.8  0.7   -Will avoid hepatotoxins

## 2023-07-18 NOTE — Assessment & Plan Note (Signed)
 Continue PPI ?

## 2023-07-18 NOTE — ED Notes (Addendum)
 Dr Hyacinth Meeker aware of temp and at bedside, pt stated he hit head when he fell, has small abrasion noted to top of head. Pt is a/o at this time. Sleepy but easily awakes. Warm to touch. Color wnl.  Blanket warmer applied.

## 2023-07-18 NOTE — Hospital Course (Addendum)
 Jamie [name] is a 53 year old male with extensive history of major depressive disorder and anxiety, tobacco abuse, coronary artery disease, GERD hepatic cirrhosis... Presented to the ED on 07/18/2023 around 3:48 PM for overdose on clonidine. Patient a lot of his clonidine medication-more than he should. Per ED report he took over 100 tablets of 0.1 mg. Patient denies of suicide attempt ideation or plan. Currently he is somnolent otherwise arousable, following commands.    ED evaluation/course: Blood pressure (!) 118/96, pulse (!) 29, temperature (!) 95.6 F (35.3 C), temperature source Rectal, resp. rate 16, height 5\' 11"  (1.803 m), weight 95 kg, SpO2 99%.  -Labs: Reviewed essentially within normal limits with exception of sodium 134, glucose 166, alk phos 35 AST 13 ALT 13 Tylenol and aspirin level <10 and < 7.0  Urine drug screen positive for Cocaine and Marijuana   Patient has been evaluated by psych for possible attempt of suicide... After extensive discussion patient has agreed to inpatient treatment.  This morning patient was found hypothermic, bradycardic. Requested patient to be admitted for observation and medical clearance.

## 2023-07-18 NOTE — ED Notes (Signed)
 Pt has visitors, pt eating

## 2023-07-18 NOTE — ED Notes (Signed)
 Pt resting. Easily awakes. Unable to get oral/axillary tempt. Will try rectal. Pt oriented x4 when awake. Pt denies si/hi/avh but is sad and becomes tearful talking about his  mother.

## 2023-07-19 ENCOUNTER — Inpatient Hospital Stay: Admission: RE | Admit: 2023-07-19 | Payer: Self-pay | Source: Intra-hospital | Admitting: Psychiatry

## 2023-07-19 DIAGNOSIS — T50904A Poisoning by unspecified drugs, medicaments and biological substances, undetermined, initial encounter: Secondary | ICD-10-CM | POA: Diagnosis not present

## 2023-07-19 LAB — BASIC METABOLIC PANEL WITH GFR
Anion gap: 7 (ref 5–15)
BUN: 16 mg/dL (ref 6–20)
CO2: 23 mmol/L (ref 22–32)
Calcium: 8.9 mg/dL (ref 8.9–10.3)
Chloride: 104 mmol/L (ref 98–111)
Creatinine, Ser: 0.82 mg/dL (ref 0.61–1.24)
GFR, Estimated: >60 mL/min (ref >60)
Glucose, Bld: 131 mg/dL — ABNORMAL HIGH (ref 70–99)
Potassium: 3.6 mmol/L (ref 3.5–5.1)
Sodium: 134 mmol/L — ABNORMAL LOW (ref 135–145)

## 2023-07-19 MED ORDER — HYDROCHLOROTHIAZIDE 25 MG PO TABS
25.0000 mg | ORAL_TABLET | Freq: Every day | ORAL | Status: DC
  Administered 2023-07-20 – 2023-07-21 (×2): 25 mg via ORAL
  Filled 2023-07-19 (×2): qty 1

## 2023-07-19 MED ORDER — LACTATED RINGERS IV BOLUS
1000.0000 mL | Freq: Once | INTRAVENOUS | Status: AC
  Administered 2023-07-19: 1000 mL via INTRAVENOUS

## 2023-07-19 MED ORDER — PROCHLORPERAZINE EDISYLATE 10 MG/2ML IJ SOLN
10.0000 mg | Freq: Four times a day (QID) | INTRAMUSCULAR | Status: DC | PRN
  Administered 2023-07-19: 10 mg via INTRAVENOUS
  Filled 2023-07-19: qty 2

## 2023-07-19 MED ORDER — ALUM & MAG HYDROXIDE-SIMETH 200-200-20 MG/5ML PO SUSP
30.0000 mL | Freq: Four times a day (QID) | ORAL | Status: DC | PRN
  Administered 2023-07-19: 30 mL via ORAL
  Filled 2023-07-19: qty 30

## 2023-07-19 NOTE — Progress Notes (Signed)
 PROGRESS NOTE    Patient: Jamie [name]                            PCP: Pcp, No                    DOB: 11/18/1970            DOA: 07/18/2023 [id number]             DOS: 07/19/2023, 10:33 AM   LOS: 1 day   Date of Service: The patient was seen and examined on 07/19/2023  Subjective:   The patient was seen and examined this morning. Hemodynamically stable. No issues overnight .  Brief Narrative:   Jamie [name] is a 53 year old male with extensive history of major depressive disorder and anxiety, tobacco abuse, coronary artery disease, GERD hepatic cirrhosis... Presented to the ED on 07/18/2023 around 3:48 PM for overdose on clonidine. Patient a lot of his clonidine medication-more than he should. Per ED report he took over 100 tablets of 0.1 mg. Patient denies of suicide attempt ideation or plan. Currently he is somnolent otherwise arousable, following commands.    ED evaluation/course: Blood pressure (!) 118/96, pulse (!) 29, temperature (!) 95.6 F (35.3 C), temperature source Rectal, resp. rate 16, height 5\' 11"  (1.803 m), weight 95 kg, SpO2 99%.  -Labs: Reviewed essentially within normal limits with exception of sodium 134, glucose 166, alk phos 35 AST 13 ALT 13 Tylenol and aspirin level <10 and < 7.0  Urine drug screen positive for Cocaine and Marijuana   Patient has been evaluated by psych for possible attempt of suicide... After extensive discussion patient has agreed to inpatient treatment.  This morning patient was found hypothermic, bradycardic. Requested patient to be admitted for observation and medical clearance.        Assessment & Plan:   Principal Problem:   Drug overdose Active Problems:   Suicide attempt (HCC)   Substance induced mood disorder (HCC)   Tobacco abuse   Gastroesophageal reflux disease   Coronary artery disease involving native coronary artery of native heart without angina pectoris   Essential hypertension   Hepatic fibrosis    Major depressive disorder     Assessment and Plan: * Drug overdose - Overdose on clonidine -Hemodynamically stable    -Psych is initiated Remeron 7.5 mg nightly  Substance induced mood disorder (HCC) - Extensive history of substance abuse currently UDS positive for cocaine and marijuana -Patient took excessive number of tablets clonidine   Suicide attempt [facility]) - Currently patient denied having any homicidal suicidal ideation - Overdosed on clonidine ? Per ED report he took  100 tablets of 0.1 mg. -Poison control was called, patient to be observed closely for approximately 8 hours Patient remained somnolent, bradycardic, hypothermic-otherwise hemodynamically stable -Will continue to monitor closely -Suicide precaution -Psych consulted, recommending inpatient psych admission once medically stable-patient is agreeable  Tobacco abuse - Patient has been counseled regarding tobacco use and abuse -NicoDerm patch has been provided  Essential hypertension -BP stable now, resume HCTZ  Coronary artery disease involving native coronary artery of native heart without angina pectoris - Continue aspirin  Gastroesophageal reflux disease -Was complaining of abdominal discomfort, and reflux this morning, continue PPI, adding Maalox   Major depressive disorder Major depressive disorder with anxiety Current reviewed med rec. -not updated: Revealing: Celexa 2 different doses 40 and 20 mg, Valium 10 mg twice a day, Neurontin 100  mg daily  -Will continue Celexa 20 mg p.o. daily, psychiatrist added Remeron 7.5 mg p.o. nightly  Hepatic fibrosis LFTs within normal limits -Will avoid hepatotoxins   Patient is medically stable to be transferred to psych today 4/102025  ----------------------------------------------------------------------------------------------------------------------------------------------- Nutritional status:  The patient's BMI is: Body mass index is 27.43 kg/m. I  agree with the assessment and plan as  ------------------------------------------------------------------------------------------------------------------------------------------------  DVT prophylaxis:  heparin injection 5,000 Units Start: 07/18/23 1400 TED hose Start: 07/18/23 0935 SCDs Start: 07/18/23 0935   Code Status:   Code Status: Full Code  Family Communication: No family member present at bedside- Admission status:   Status is: Inpatient Remains inpatient appropriate because: Needing psych evaluation and admission   Disposition: From  - home             Cleared medically to be transferred to psych  Procedures:   No admission procedures for hospital encounter.   Antimicrobials:  Anti-infectives (From admission, onward)    None        Medication:   aspirin EC  81 mg Oral Daily   citalopram  20 mg Oral Daily   heparin  5,000 Units Subcutaneous Q8H   [START ON 07/20/2023] hydrochlorothiazide  25 mg Oral Daily   mirtazapine  7.5 mg Oral QHS   pantoprazole  40 mg Oral Daily   sodium chloride flush  3 mL Intravenous Q12H   tamsulosin  0.4 mg Oral Daily    acetaminophen **OR** acetaminophen, alum & mag hydroxide-simeth, bisacodyl, haloperidol lactate, HYDROmorphone (DILAUDID) injection, ipratropium, levalbuterol, naloxone, ondansetron **OR** ondansetron (ZOFRAN) IV, oxyCODONE, prochlorperazine, senna-docusate   Objective:   Vitals:   07/19/23 0401 07/19/23 0500 07/19/23 0700 07/19/23 0800  BP:  (!) 96/58  107/63  Pulse: (!) 40 (!) 47  (!) 45  Resp: 17 13  16   Temp: 98 F (36.7 C)  98.2 F (36.8 C)   TempSrc: Oral  Oral   SpO2: 95% 94%  99%  Weight: 89.2 kg     Height:        Intake/Output Summary (Last 24 hours) at 07/19/2023 1033 Last data filed at 07/19/2023 0600 Gross per 24 hour  Intake 1139.74 ml  Output 900 ml  Net 239.74 ml   Filed Weights   07/18/23 0358 07/18/23 1632 07/19/23 0401  Weight: 95 kg 88 kg 89.2 kg     Physical  examination:   Constitution:  Alert, cooperative, no distress,  Appears calm and comfortable  Psychiatric:   Normal and stable mood and affect, cognition intact,   HEENT:        Normocephalic, PERRL, otherwise with in Normal limits  Chest:         Chest symmetric Cardio vascular:  S1/S2, RRR, No murmure, No Rubs or Gallops  pulmonary: Clear to auscultation bilaterally, respirations unlabored, negative wheezes / crackles Abdomen: Soft, non-tender, non-distended, bowel sounds,no masses, no organomegaly Muscular skeletal: Limited exam - in bed, able to move all 4 extremities,   Neuro: CNII-XII intact. , normal motor and sensation, reflexes intact  Extremities: No pitting edema lower extremities, +2 pulses  Skin: Dry, warm to touch, negative for any Rashes, No open wounds Wounds: per nursing documentation   ------------------------------------------------------------------------------------------------------------------------------------------    LABs:     Latest Ref Rng & Units 07/18/2023    4:03 AM 06/12/2021    3:50 PM 04/29/2020    2:09 PM  CBC  WBC 4.0 - 10.5 K/uL 6.9   7.5   Hemoglobin 13.0 - 17.0 g/dL 14.1  13.6  15.2   Hematocrit 39.0 - 52.0 % 40.1  40.0  43.8   Platelets 150 - 400 K/uL 281   216       Latest Ref Rng & Units 07/19/2023    4:52 AM 07/18/2023    4:03 AM 02/13/2022    7:50 AM  CMP  Glucose 70 - 99 mg/dL 478  295  93   BUN 6 - 20 mg/dL 16  20  17    Creatinine 0.61 - 1.24 mg/dL 6.21  3.08  6.57   Sodium 135 - 145 mmol/L 134  134  138   Potassium 3.5 - 5.1 mmol/L 3.6  3.9  2.9   Chloride 98 - 111 mmol/L 104  104  100   CO2 22 - 32 mmol/L 23  22  30    Calcium 8.9 - 10.3 mg/dL 8.9  8.9  9.2   Total Protein 6.5 - 8.1 g/dL  6.9    Total Bilirubin 0.0 - 1.2 mg/dL  1.1    Alkaline Phos 38 - 126 U/L  35    AST 15 - 41 U/L  13    ALT 0 - 44 U/L  13         Micro Results Recent Results (from the past 240 hours)  MRSA Next Gen by PCR, Nasal     Status: None    Collection Time: 07/18/23  4:50 PM   Specimen: Nasal Mucosa; Nasal Swab  Result Value Ref Range Status   MRSA by PCR Next Gen NOT DETECTED NOT DETECTED Final    Comment: (NOTE) The GeneXpert MRSA Assay (FDA approved for NASAL specimens only), is one component of a comprehensive MRSA colonization surveillance program. It is not intended to diagnose MRSA infection nor to guide or monitor treatment for MRSA infections. Test performance is not FDA approved in patients less than 100 years old. Performed at [facility], [address] [place], [place] [address]     Radiology Reports No results found.  SIGNED: Kendell Bane, MD, FHM. FAAFP. [facility] - [facility] Time spent - 55 min.  In seeing, evaluating and examining the patient. Reviewing medical records, labs, drawn plan of care. [facility],  Pager (please use [email or web address] to page/ text) Please use Epic Secure Chat for non-urgent communication (7AM-7PM)  If 7PM-7AM, please contact night-coverage [email or web address], 07/19/2023, 10:33 AM

## 2023-07-19 NOTE — Progress Notes (Signed)
 Therapist entered room to complete evaluation.  Sitter states a therapist had just been in 30 minutes ago.  When pt stood BP dropped and pt became dizzy.  WIll see tomorrow.   Virgina Organ, PT CLT [phone]

## 2023-07-19 NOTE — Plan of Care (Signed)
  Problem: Acute Rehab PT Goals(only PT should resolve) Goal: Patient Will Transfer Sit To/From Stand Outcome: Progressing Flowsheets (Taken 07/19/2023 1649) Patient will transfer sit to/from stand: Independently Goal: Pt Will Transfer Bed To Chair/Chair To Bed Outcome: Progressing Flowsheets (Taken 07/19/2023 1649) Pt will Transfer Bed to Chair/Chair to Bed: Independently Goal: Pt Will Ambulate Outcome: Progressing Flowsheets (Taken 07/19/2023 1649) Pt will Ambulate:  50 feet  Independently   4:49 PM, 07/19/23 Jamie [name], PT, DPT [name] with [facility]

## 2023-07-19 NOTE — Evaluation (Signed)
 Physical Therapy Evaluation Patient Details Name: Jamie [name] MRN: [id number] DOB: 07/23/1970 Today's Date: 07/19/2023  History of Present Illness  Jamie [name] is a 53 year old male with extensive history of major depressive disorder and anxiety, tobacco abuse, coronary artery disease, GERD hepatic cirrhosis...  Presented to the ED on 07/18/2023 around 3:48 PM for overdose on clonidine.  Patient a lot of his clonidine medication-more than he should.  Per ED report he took over 100 tablets of 0.1 mg.  Patient denies of suicide attempt ideation or plan.  Currently he is somnolent otherwise arousable, following commands.   Clinical Impression  Patient is limited on this day during PT Evaluation due to BP fluctuations. At baseline, patient is independent with all ADLs, and iADLs, does not require any AD for ambulation in home or community. On this date, patient is (I) with bed mobility but requires CGA for functional transfers and ambulation due to dizziness. Seated EOB, patient BP is 135/114. Nursing is notified. Patient with CGA bed<>toilet in restroom. Following stand from toilet, patient demonstrates moderate swaying and returns to EOB. BP 82/60. Once supine with feet elevated and a couple minutes rest, BP 87/58. Nursing/sitter present t/o session and remains with patient at end of session. Nurse notified of orthostatics. Patient most limited by dizziness. With control of BP, patient likely to return to PLOF. Will keep on for PT to reassess ambulation once BP controlled. Patient will benefit from skilled physical therapy acutely in order to address the above but will likely not require any further PT services once discharged.       If plan is discharge home, recommend the following: A little help with walking and/or transfers   Can travel by private vehicle        Equipment Recommendations None recommended by PT  Recommendations for Other Services       Functional Status Assessment  Patient has had a recent decline in their functional status and demonstrates the ability to make significant improvements in function in a reasonable and predictable amount of time.     Precautions / Restrictions Precautions Precautions: Fall Restrictions Weight Bearing Restrictions Per Provider Order: No      Mobility  Bed Mobility Overal bed mobility: Independent             General bed mobility comments: HOB flat. No use of railings. Pt reports dizziness sitting EOB. BP 135/114.    Transfers Overall transfer level: Independent Equipment used: None               General transfer comment: STS from bed and toilet. Use of grab bars in restroom. Increased dizziness after toilet tranfer. Cont dizziness.    Ambulation/Gait Ambulation/Gait assistance: Contact guard assist Gait Distance (Feet): 15 Feet Assistive device: None Gait Pattern/deviations: Drifts right/left, Step-through pattern Gait velocity: Decreased     General Gait Details: Pt amb bed>bathroom, no unsteadiness, mild dizziness. Bathroom>bed inc dizziness, causing pt to sway. BP checked once seated EOB 82/60  Stairs            Wheelchair Mobility     Tilt Bed    Modified Rankin (Stroke Patients Only)       Balance Overall balance assessment: Needs assistance Sitting-balance support: No upper extremity supported, Feet supported Sitting balance-Leahy Scale: Good Sitting balance - Comments: Seated EOB   Standing balance support: During functional activity, No upper extremity supported Standing balance-Leahy Scale: Fair Standing balance comment: No AD, swaying occurs 2/2 dizziness. No overt LOB occurring  Pertinent Vitals/Pain Pain Assessment Pain Assessment: 0-10 Pain Score: 2  Pain Location: Stomach Pain Descriptors / Indicators: Aching Pain Intervention(s): Monitored during session, Repositioned    Home Living Family/patient expects to be  discharged to:: Private residence Living Arrangements: Alone   Type of Home: House Home Access: Stairs to enter Entrance Stairs-Rails: Can reach both;Left;Right Entrance Stairs-Number of Steps: 2   Home Layout: One level Home Equipment: Agricultural consultant (2 wheels)      Prior Function Prior Level of Function : Independent/Modified Independent             Mobility Comments: Programmer, multimedia without AD ADLs Comments: (I) with all     Extremity/Trunk Assessment   Upper Extremity Assessment Upper Extremity Assessment: Overall WFL for tasks assessed (shoulder flex ROM WFL, 4+/5 MMT)    Lower Extremity Assessment Lower Extremity Assessment: Overall WFL for tasks assessed (5/5 DF, 4+/5 hip flex bilat)    Cervical / Trunk Assessment Cervical / Trunk Assessment: Normal  Communication   Communication Communication: No apparent difficulties    Cognition Arousal: Alert Behavior During Therapy: Flat affect                             Following commands: Intact       Cueing Cueing Techniques: Verbal cues     General Comments      Exercises     Assessment/Plan    PT Assessment Patient needs continued PT services  PT Problem List Decreased balance;Decreased activity tolerance;Pain       PT Treatment Interventions Gait training;Functional mobility training;Therapeutic activities;Therapeutic exercise;Balance training    PT Goals (Current goals can be found in the Care Plan section)  Acute Rehab PT Goals Patient Stated Goal: Return home PT Goal Formulation: With patient Time For Goal Achievement: 07/26/23 Potential to Achieve Goals: Good    Frequency Min 3X/week     Co-evaluation               AM-PAC PT "6 Clicks" Mobility  Outcome Measure Help needed turning from your back to your side while in a flat bed without using bedrails?: None Help needed moving from lying on your back to sitting on the side of a flat bed without  using bedrails?: None Help needed moving to and from a bed to a chair (including a wheelchair)?: A Little Help needed standing up from a chair using your arms (e.g., wheelchair or bedside chair)?: None Help needed to walk in hospital room?: A Little Help needed climbing 3-5 steps with a railing? : A Little 6 Click Score: 21    End of Session   Activity Tolerance: Other (comment) (Limited 2/2 dizziness d/t BP drop) Patient left: in bed;with nursing/sitter in room;with call bell/phone within reach Nurse Communication: Mobility status PT Visit Diagnosis: Unsteadiness on feet (R26.81);Other abnormalities of gait and mobility (R26.89);Dizziness and giddiness (R42)    Time: 1610-9604 PT Time Calculation (min) (ACUTE ONLY): 22 min   Charges:   PT Evaluation $PT Eval Moderate Complexity: 1 Mod PT Treatments $Therapeutic Activity: 8-22 mins PT General Charges $$ ACUTE PT VISIT: 1 Visit        4:47 PM, 07/19/23 Shahram Alexopoulos Powell-Butler, PT, DPT [place] with [facility]

## 2023-07-20 DIAGNOSIS — T50904A Poisoning by unspecified drugs, medicaments and biological substances, undetermined, initial encounter: Secondary | ICD-10-CM | POA: Diagnosis not present

## 2023-07-20 LAB — GLUCOSE, CAPILLARY: Glucose-Capillary: 103 mg/dL — ABNORMAL HIGH (ref 70–99)

## 2023-07-20 MED ORDER — ZOLPIDEM TARTRATE 5 MG PO TABS
5.0000 mg | ORAL_TABLET | Freq: Once | ORAL | Status: AC
  Administered 2023-07-21: 5 mg via ORAL
  Filled 2023-07-20: qty 1

## 2023-07-20 MED ORDER — MIRTAZAPINE 7.5 MG PO TABS: 7.5000 mg | ORAL_TABLET | Freq: Every day | ORAL | Status: DC

## 2023-07-20 MED ORDER — TAMSULOSIN HCL 0.4 MG PO CAPS: 0.4000 mg | ORAL_CAPSULE | Freq: Every day | ORAL | Status: DC

## 2023-07-20 NOTE — Progress Notes (Signed)
 LCSW Progress Note:   MRN: [id number]  Jamie [name]. Cid  07/20/2023 5:39 PM  Per chart review, the patient is scheduled for discharge today to [facility] psychiatric inpatient and will be transported by safe transport. The nurse has been notified. TOC is signing off.

## 2023-07-20 NOTE — TOC Transition Note (Signed)
 Transition of Care [facility]) - Discharge Note   Patient Details  Name: Jamie [name] MRN: [id number] Date of Birth: 08-18-1970  [facility] CM/SW Contact:  Isabella Bowens, LCSWA Phone Number: 07/20/2023, 12:13 PM   Clinical Narrative:     Patient is scheduled to DC today to [facility] psych inpatient and will transport by safe transport. Nurse notified. TOC signing off.   Final next level of care: Psychiatric Hospital Barriers to Discharge: Barriers Resolved   Patient Goals and CMS Choice Patient states their goals for this hospitalization and ongoing recovery are:: DC to [facility] for inpatient psych          Discharge Placement              Patient chooses bed at:  [facility]) Patient to be transferred to facility by: [facility] Name of family member notified: Alex- patient Patient and family notified of of transfer: 07/20/23  Discharge Plan and Services Additional resources added to the After Visit Summary for   In-house Referral: Clinical Social Work                                   Social Drivers of Health (SDOH) Interventions SDOH Screenings   Food Insecurity: No Food Insecurity (07/18/2023)  Housing: Low Risk  (07/18/2023)  Transportation Needs: No Transportation Needs (07/18/2023)  Utilities: Not At Risk (07/18/2023)  Tobacco Use: High Risk (07/18/2023)     Readmission Risk Interventions    07/20/2023   12:09 PM  Readmission Risk Prevention Plan  Transportation Screening Complete  Home Care Screening Complete  Medication Review (RN CM) Complete

## 2023-07-20 NOTE — Plan of Care (Signed)

## 2023-07-20 NOTE — Plan of Care (Signed)

## 2023-07-20 NOTE — Discharge Summary (Addendum)
 Physician Discharge Summary   Patient: Jamie [name] MRN: [id number] DOB: 04-08-1971  Admit date:     07/18/2023  Discharge date: 07/21/2023  Discharge Physician: Mickle Albe    PCP: Pcp, No    Discharge Diagnoses: Principal Problem:   Drug overdose Active Problems:   Suicide attempt [facility])   Substance induced mood disorder (HCC)   Tobacco abuse   Gastroesophageal reflux disease   Coronary artery disease involving native coronary artery of native heart without angina pectoris   Essential hypertension   Hepatic fibrosis   Major depressive disorder  Resolved Problems:   * No resolved hospital problems. *  Hospital Course:  Jamie [name] is a 53 year old male with extensive history of major depressive disorder and anxiety, tobacco abuse, coronary artery disease, GERD hepatic cirrhosis. Presented to the ED on 07/18/2023 around 3:48 PM for overdose on clonidine. Patient took a lot of his clonidine medication-more than he should. Per ED report he took over 100 tablets of 0.1 mg. Patient denies of suicide attempt ideation or plan. Currently he is somnolent otherwise arousable, following commands. Urine drug screen positive for Cocaine and Marijuana. Psychiatry was consulted. Poison control also contacted. Patient was evaluated by psych for possible attempt of suicide. After extensive discussion the patient has agreed to inpatient treatment.    Per the SW note on 07/20/2023 the patient is scheduled to DC today to [facility] psych inpatient and will transport by safe transport. Nurse notified. TOC signing off.    PLEASE NOTE ACTUAL DISCHARGE DATE WILL BE 07/21/2023 BECAUSE THERE WAS NO INPATIENT PSYCH BED AVAILABLE ON 07/20/2023.  DISCHARGE MEDICATION: Allergies as of 07/20/2023       Reactions   Doxepin Other (See Comments)   Dronabinol Other (See Comments)   Tramadol Other (See Comments)   Trazodone Other (See Comments)        Medication List     STOP taking these medications     cloNIDine 0.1 MG tablet Commonly known as: CATAPRES   diazepam 10 MG tablet Commonly known as: VALIUM   Suboxone 8-2 MG Film Generic drug: Buprenorphine HCl-Naloxone HCl       TAKE these medications    aspirin EC 81 MG tablet Take 1 tablet (81 mg total) by mouth daily. Swallow whole.   citalopram 20 MG tablet Commonly known as: CELEXA Take 20 mg by mouth daily. Take with 40 mg to equal 60 mg What changed: Another medication with the same name was removed. Continue taking this medication, and follow the directions you see here.   diclofenac Sodium 1 % Gel Commonly known as: VOLTAREN Apply 1 application topically in the morning, at noon, in the evening, and at bedtime.   hydrochlorothiazide 25 MG tablet Commonly known as: HYDRODIURIL Take 25 mg by mouth daily.   mirtazapine 7.5 MG tablet Commonly known as: REMERON Take 1 tablet (7.5 mg total) by mouth at bedtime.   naloxone 4 MG/0.1ML Liqd nasal spray kit Commonly known as: NARCAN as needed. overdose   pantoprazole 40 MG tablet Commonly known as: PROTONIX Take 1 tablet by mouth daily.   tamsulosin 0.4 MG Caps capsule Commonly known as: FLOMAX Take 1 capsule (0.4 mg total) by mouth daily. Start taking on: July 21, 2023        Discharge Exam: Jamie [name] Weights   07/18/23 1632 07/19/23 0401 07/20/23 0500  Weight: 88 kg 89.2 kg 84.7 kg   Physical Exam HENT:     Head: Normocephalic.     Mouth/Throat:  Mouth: Mucous membranes are moist.  Cardiovascular:     Rate and Rhythm: Normal rate and regular rhythm.  Pulmonary:     Effort: Pulmonary effort is normal.  Abdominal:     Palpations: Abdomen is soft.  Musculoskeletal:        General: Normal range of motion.     Cervical back: Neck supple.  Skin:    General: Skin is warm.  Neurological:     Mental Status: He is alert. Mental status is at baseline.      Condition at discharge: fair  The results of significant diagnostics from this  hospitalization (including imaging, microbiology, ancillary and laboratory) are listed below for reference.   Imaging Studies: CT Head Wo Contrast Result Date: 07/18/2023 CLINICAL DATA:  53 year old male with altered mental status, clonidine overdose. EXAM: CT HEAD WITHOUT CONTRAST TECHNIQUE: Contiguous axial images were obtained from the base of the skull through the vertex without intravenous contrast. RADIATION DOSE REDUCTION: This exam was performed according to the departmental dose-optimization program which includes automated exposure control, adjustment of the mA and/or kV according to patient size and/or use of iterative reconstruction technique. COMPARISON:  Head CT 02/26/2013. FINDINGS: Brain: Cerebral volume remains normal. No midline shift, ventriculomegaly, mass effect, evidence of mass lesion, intracranial hemorrhage or evidence of cortically based acute infarction. Gray-white matter differentiation is within normal limits throughout the brain. Vascular: No suspicious intracranial vascular hyperdensity. Mild Calcified atherosclerosis at the skull base. Skull: Stable since 2014, negative. Sinuses/Orbits: Visualized paranasal sinuses and mastoids are stable and well aerated. Other: Visualized orbits and scalp soft tissues are within normal limits. IMPRESSION: Stable since 2014 and normal noncontrast Head CT. Electronically Signed   By: Marlise Simpers M.D.   On: 07/18/2023 10:26    Microbiology: Results for orders placed or performed during the hospital encounter of 07/18/23  MRSA Next Gen by PCR, Nasal     Status: None   Collection Time: 07/18/23  4:50 PM   Specimen: Nasal Mucosa; Nasal Swab  Result Value Ref Range Status   MRSA by PCR Next Gen NOT DETECTED NOT DETECTED Final    Comment: (NOTE) The GeneXpert MRSA Assay (FDA approved for NASAL specimens only), is one component of a comprehensive MRSA colonization surveillance program. It is not intended to diagnose MRSA infection nor to  guide or monitor treatment for MRSA infections. Test performance is not FDA approved in patients less than 1 years old. Performed at [facility], [address] [place], [place] [address]     Labs: CBC: Recent Labs  Lab 07/18/23 0403  WBC 6.9  HGB 14.1  HCT 40.1  MCV 89.7  PLT 281   Basic Metabolic Panel: Recent Labs  Lab 07/18/23 0403 07/18/23 0957 07/19/23 0452  NA 134*  --  134*  K 3.9  --  3.6  CL 104  --  104  CO2 22  --  23  GLUCOSE 166*  --  131*  BUN 20  --  16  CREATININE 0.85  --  0.82  CALCIUM 8.9  --  8.9  MG  --  2.0  --   PHOS  --  4.2  --    Liver Function Tests: Recent Labs  Lab 07/18/23 0403  AST 13*  ALT 13  ALKPHOS 35*  BILITOT 1.1  PROT 6.9  ALBUMIN 3.8   CBG: Recent Labs  Lab 07/18/23 0416 07/20/23 0738  GLUCAP 175* 103*    Discharge time spent: greater than 30 minutes.  Signed:  Smith Mcnicholas , MD [facility] 07/21/2023

## 2023-07-21 ENCOUNTER — Inpatient Hospital Stay (HOSPITAL_COMMUNITY)
Admission: AD | Admit: 2023-07-21 | Discharge: 2023-07-26 | DRG: 885 | Disposition: A | Discharge status: Home / Self Care | Source: Intra-hospital | Attending: Psychiatry | Admitting: Psychiatry

## 2023-07-21 DIAGNOSIS — Z833 Family history of diabetes mellitus: Secondary | ICD-10-CM

## 2023-07-21 DIAGNOSIS — F17210 Nicotine dependence, cigarettes, uncomplicated: Secondary | ICD-10-CM | POA: Diagnosis present

## 2023-07-21 DIAGNOSIS — M2550 Pain in unspecified joint: Secondary | ICD-10-CM | POA: Diagnosis present

## 2023-07-21 DIAGNOSIS — Z79899 Other long term (current) drug therapy: Secondary | ICD-10-CM

## 2023-07-21 DIAGNOSIS — F332 Major depressive disorder, recurrent severe without psychotic features: Secondary | ICD-10-CM | POA: Diagnosis not present

## 2023-07-21 DIAGNOSIS — F322 Major depressive disorder, single episode, severe without psychotic features: Secondary | ICD-10-CM

## 2023-07-21 DIAGNOSIS — Z825 Family history of asthma and other chronic lower respiratory diseases: Secondary | ICD-10-CM

## 2023-07-21 DIAGNOSIS — K219 Gastro-esophageal reflux disease without esophagitis: Secondary | ICD-10-CM | POA: Diagnosis present

## 2023-07-21 DIAGNOSIS — M5416 Radiculopathy, lumbar region: Secondary | ICD-10-CM | POA: Diagnosis present

## 2023-07-21 DIAGNOSIS — Z800 Family history of malignant neoplasm of digestive organs: Secondary | ICD-10-CM

## 2023-07-21 DIAGNOSIS — Z555 Less than a high school diploma: Secondary | ICD-10-CM

## 2023-07-21 DIAGNOSIS — M1990 Unspecified osteoarthritis, unspecified site: Secondary | ICD-10-CM | POA: Diagnosis present

## 2023-07-21 DIAGNOSIS — M25569 Pain in unspecified knee: Secondary | ICD-10-CM | POA: Diagnosis present

## 2023-07-21 DIAGNOSIS — I10 Essential (primary) hypertension: Secondary | ICD-10-CM | POA: Diagnosis present

## 2023-07-21 DIAGNOSIS — Z87820 Personal history of traumatic brain injury: Secondary | ICD-10-CM

## 2023-07-21 DIAGNOSIS — T465X2A Poisoning by other antihypertensive drugs, intentional self-harm, initial encounter: Secondary | ICD-10-CM | POA: Diagnosis present

## 2023-07-21 DIAGNOSIS — R7303 Prediabetes: Secondary | ICD-10-CM | POA: Diagnosis present

## 2023-07-21 DIAGNOSIS — Z7982 Long term (current) use of aspirin: Secondary | ICD-10-CM | POA: Diagnosis not present

## 2023-07-21 DIAGNOSIS — Z634 Disappearance and death of family member: Secondary | ICD-10-CM

## 2023-07-21 DIAGNOSIS — K7400 Hepatic fibrosis, unspecified: Secondary | ICD-10-CM | POA: Diagnosis present

## 2023-07-21 DIAGNOSIS — Z96652 Presence of left artificial knee joint: Secondary | ICD-10-CM | POA: Diagnosis present

## 2023-07-21 DIAGNOSIS — Z8619 Personal history of other infectious and parasitic diseases: Secondary | ICD-10-CM

## 2023-07-21 DIAGNOSIS — F1290 Cannabis use, unspecified, uncomplicated: Secondary | ICD-10-CM | POA: Diagnosis present

## 2023-07-21 DIAGNOSIS — G8929 Other chronic pain: Secondary | ICD-10-CM | POA: Diagnosis present

## 2023-07-21 DIAGNOSIS — Z6379 Other stressful life events affecting family and household: Secondary | ICD-10-CM

## 2023-07-21 DIAGNOSIS — M791 Myalgia, unspecified site: Secondary | ICD-10-CM | POA: Diagnosis present

## 2023-07-21 DIAGNOSIS — Z560 Unemployment, unspecified: Secondary | ICD-10-CM

## 2023-07-21 DIAGNOSIS — F1994 Other psychoactive substance use, unspecified with psychoactive substance-induced mood disorder: Principal | ICD-10-CM | POA: Diagnosis present

## 2023-07-21 DIAGNOSIS — F1490 Cocaine use, unspecified, uncomplicated: Secondary | ICD-10-CM | POA: Diagnosis present

## 2023-07-21 DIAGNOSIS — Z809 Family history of malignant neoplasm, unspecified: Secondary | ICD-10-CM

## 2023-07-21 DIAGNOSIS — Z9889 Other specified postprocedural states: Secondary | ICD-10-CM

## 2023-07-21 DIAGNOSIS — F411 Generalized anxiety disorder: Secondary | ICD-10-CM | POA: Diagnosis present

## 2023-07-21 DIAGNOSIS — Z811 Family history of alcohol abuse and dependence: Secondary | ICD-10-CM

## 2023-07-21 DIAGNOSIS — Z635 Disruption of family by separation and divorce: Secondary | ICD-10-CM

## 2023-07-21 DIAGNOSIS — Z8249 Family history of ischemic heart disease and other diseases of the circulatory system: Secondary | ICD-10-CM

## 2023-07-21 LAB — GLUCOSE, CAPILLARY
Glucose-Capillary: 80 mg/dL (ref 70–99)
Glucose-Capillary: 113 mg/dL — ABNORMAL HIGH (ref 70–99)

## 2023-07-21 MED ORDER — ACETAMINOPHEN 325 MG PO TABS
650.0000 mg | ORAL_TABLET | Freq: Once | ORAL | Status: AC
  Administered 2023-07-21: 650 mg via ORAL
  Filled 2023-07-21: qty 2

## 2023-07-21 MED ORDER — MAGNESIUM HYDROXIDE 400 MG/5ML PO SUSP
30.0000 mL | Freq: Every day | ORAL | Status: DC | PRN
Start: 2023-07-21 — End: 2023-07-26

## 2023-07-21 MED ORDER — DIPHENHYDRAMINE HCL 50 MG/ML IJ SOLN
50.0000 mg | Freq: Three times a day (TID) | INTRAMUSCULAR | Status: DC | PRN
Start: 2023-07-21 — End: 2023-07-26

## 2023-07-21 MED ORDER — HALOPERIDOL 5 MG PO TABS
5.0000 mg | ORAL_TABLET | Freq: Three times a day (TID) | ORAL | Status: DC | PRN
  Administered 2023-07-22: 5 mg via ORAL
  Filled 2023-07-21: qty 1

## 2023-07-21 MED ORDER — DIPHENHYDRAMINE HCL 50 MG/ML IJ SOLN: 50.0000 mg | Freq: Three times a day (TID) | INTRAMUSCULAR | Status: DC | PRN

## 2023-07-21 MED ORDER — HALOPERIDOL LACTATE 5 MG/ML IJ SOLN: 10.0000 mg | Freq: Three times a day (TID) | INTRAMUSCULAR | Status: DC | PRN

## 2023-07-21 MED ORDER — HYDROXYZINE HCL 25 MG PO TABS
25.0000 mg | ORAL_TABLET | Freq: Three times a day (TID) | ORAL | Status: DC | PRN
  Administered 2023-07-22 – 2023-07-26 (×9): 25 mg via ORAL
  Filled 2023-07-21 (×9): qty 1

## 2023-07-21 MED ORDER — ALUM & MAG HYDROXIDE-SIMETH 200-200-20 MG/5ML PO SUSP: 30.0000 mL | ORAL | Status: DC | PRN

## 2023-07-21 MED ORDER — HALOPERIDOL LACTATE 5 MG/ML IJ SOLN: 5.0000 mg | Freq: Three times a day (TID) | INTRAMUSCULAR | Status: DC | PRN

## 2023-07-21 MED ORDER — LORAZEPAM 2 MG/ML IJ SOLN: 2.0000 mg | Freq: Three times a day (TID) | INTRAMUSCULAR | Status: DC | PRN

## 2023-07-21 MED ORDER — ACETAMINOPHEN 325 MG PO TABS
650.0000 mg | ORAL_TABLET | Freq: Four times a day (QID) | ORAL | Status: DC | PRN
  Administered 2023-07-22 – 2023-07-26 (×11): 650 mg via ORAL
  Filled 2023-07-21 (×11): qty 2

## 2023-07-21 MED ORDER — DIPHENHYDRAMINE HCL 25 MG PO CAPS: 50.0000 mg | ORAL_CAPSULE | Freq: Three times a day (TID) | ORAL | Status: DC | PRN

## 2023-07-21 NOTE — Progress Notes (Signed)
 Transport set up with [employer]. They will be here in 30 minutes.

## 2023-07-21 NOTE — Progress Notes (Signed)
 Patient taken downstairs to vehicle of [facility] with patient home bag. Bag searched. No weapons and no items that could harm patient or others were in the patient belonging bag. Patient is currently not having any suicidal nor homicidal ideations.

## 2023-07-21 NOTE — Plan of Care (Signed)

## 2023-07-21 NOTE — Progress Notes (Signed)
 Assumed care around 1530 as 1:1 sitter. Pt has been cooperative and pleasant. Pt was able to take about an hr long nap and consumed entire dinner meal tray without difficulty. Will remain at bedside until night shift arrives.

## 2023-07-21 NOTE — Progress Notes (Signed)
 LCSW Progress Note:    MRN: [id number]  Jamie [name]  07/21/2023 11:43 PM  According to the most recent [facility] provider note on 07/20/2023, "the patient is scheduled for discharge today to [facility] psychiatric inpatient. Safe transport has been arranged, and the nurse has been notified. [facility] provider is signing off."

## 2023-07-21 NOTE — Plan of Care (Signed)

## 2023-07-21 NOTE — Progress Notes (Signed)
 Patient will be transferred to [facility] on 07-21-2023

## 2023-07-21 NOTE — Progress Notes (Signed)
 Spoke with [facility] behavioral health floor and made them aware that patient is on his way to their facility and scheduled nighttime meds given.

## 2023-07-21 NOTE — Progress Notes (Signed)
 Report provided to [facility] at [facility]. Patient to not be discharged until next shift. Accepted PO meds w/o difficulty. Will continue to monitor while in my care.

## 2023-07-22 ENCOUNTER — Encounter (HOSPITAL_COMMUNITY): Payer: Self-pay | Admitting: Nurse Practitioner

## 2023-07-22 ENCOUNTER — Other Ambulatory Visit: Payer: Self-pay

## 2023-07-22 DIAGNOSIS — F322 Major depressive disorder, single episode, severe without psychotic features: Secondary | ICD-10-CM

## 2023-07-22 MED ORDER — HYDROCHLOROTHIAZIDE 25 MG PO TABS
25.0000 mg | ORAL_TABLET | Freq: Every day | ORAL | Status: DC
  Administered 2023-07-22 – 2023-07-26 (×5): 25 mg via ORAL
  Filled 2023-07-22 (×8): qty 1

## 2023-07-22 MED ORDER — ASPIRIN 81 MG PO TBEC
81.0000 mg | DELAYED_RELEASE_TABLET | Freq: Every day | ORAL | Status: DC
  Administered 2023-07-22 – 2023-07-26 (×5): 81 mg via ORAL
  Filled 2023-07-22 (×8): qty 1

## 2023-07-22 MED ORDER — PANTOPRAZOLE SODIUM 40 MG PO TBEC
40.0000 mg | DELAYED_RELEASE_TABLET | Freq: Every day | ORAL | Status: DC
  Administered 2023-07-22 – 2023-07-26 (×5): 40 mg via ORAL
  Filled 2023-07-22 (×8): qty 1

## 2023-07-22 MED ORDER — HYDROXYZINE HCL 50 MG PO TABS
50.0000 mg | ORAL_TABLET | Freq: Every evening | ORAL | Status: DC | PRN
  Administered 2023-07-23 – 2023-07-25 (×3): 50 mg via ORAL
  Filled 2023-07-22 (×3): qty 1

## 2023-07-22 MED ORDER — SERTRALINE HCL 25 MG PO TABS
25.0000 mg | ORAL_TABLET | Freq: Every day | ORAL | Status: DC
  Administered 2023-07-22 – 2023-07-23 (×2): 25 mg via ORAL
  Filled 2023-07-22 (×5): qty 1

## 2023-07-22 MED ORDER — TAMSULOSIN HCL 0.4 MG PO CAPS
0.4000 mg | ORAL_CAPSULE | Freq: Every day | ORAL | Status: DC
  Administered 2023-07-22 – 2023-07-26 (×4): 0.4 mg via ORAL
  Filled 2023-07-22 (×8): qty 1
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
## 2023-07-22 NOTE — Group Note (Signed)
 Date:  07/22/2023 Time:  8:57 AM  Group Topic/Focus:  Goals Group:   The focus of this group is to help patients establish daily goals to achieve during treatment and discuss how the patient can incorporate goal setting into their daily lives to aide in recovery. Orientation:   The focus of this group is to educate the patient on the purpose and policies of crisis stabilization and provide a format to answer questions about their admission.  The group details unit policies and expectations of patients while admitted.    Participation Level:  Active  Participation Quality:  Appropriate  Affect:  Appropriate  Cognitive:  Oriented  Insight: Appropriate  Engagement in Group:  Engaged  Modes of Intervention:    Additional Comments:    [name] 07/22/2023, 8:57 AM
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
## 2023-07-22 NOTE — Plan of Care (Signed)
   Problem: Education: Goal: Knowledge of [facility] Education information/materials will improve Outcome: Progressing Goal: Emotional status will improve Outcome: Progressing Goal: Mental status will improve Outcome: Progressing Goal: Verbalization of understanding the information provided will improve Outcome: Progressing

## 2023-07-22 NOTE — Progress Notes (Signed)
   07/22/23 0900  Psychosocial Assessment  Patient Complaints Anxiety  Eye Contact Fair  Facial Expression Flat  Affect Flat  Speech Logical/coherent  Interaction Assertive  Motor Activity Slow  Appearance/Hygiene Unremarkable  Behavior Characteristics Anxious  Mood Anxious;Depressed  Thought Process  Coherency WDL  Content WDL  Delusions None reported or observed  Perception WDL  Hallucination None reported or observed  Judgment WDL  Confusion None  Danger to Self  Current suicidal ideation? Denies  Danger to Others  Danger to Others None reported or observed

## 2023-07-22 NOTE — Progress Notes (Signed)
 Patients stated he felt agitated, PRN Haldol 5mg  PO offered and administered.

## 2023-07-22 NOTE — [facility] Group Notes (Signed)
 [facility] Group Notes:  (Nursing/MHT/Case Management/Adjunct)  Date:  07/22/2023  Time:  9:24 PM  Type of Therapy:   Wrap-up group  Participation Level:  Did Not Attend  Participation Quality:    Affect:    Cognitive:    Insight:    Engagement in Group:    Modes of Intervention:    Summary of Progress/Problems: Refused to attend group.   Jamie [name] 07/22/2023, 9:24 PM

## 2023-07-22 NOTE — BH Assessment (Signed)
 Patient was sleeping at beginning of shift. Pt was wakened to take vitals. B/P has improved at 136/88. Patient was much calmer this shift than on admit last night as evidenced by no trembling in hands or voice. Very pleasant. Pt got up and attended group and had snack. Pt then went to bed without problems.

## 2023-07-22 NOTE — Plan of Care (Signed)
   Problem: Activity: Goal: Interest or engagement in activities will improve Outcome: Progressing   Problem: Coping: Goal: Ability to verbalize frustrations and anger appropriately will improve Outcome: Progressing   Problem: Safety: Goal: Periods of time without injury will increase Outcome: Progressing

## 2023-07-22 NOTE — BHH Counselor (Signed)
 Adult Comprehensive Assessment  Patient ID: Jamie [name], male   DOB: October 21, 1970, 54 y.o.   MRN: [id number]  Information Source: Information source: Patient  Current Stressors:  Patient states their primary concerns and needs for treatment are:: "I need to get my head straight. 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 Educational / Learning stressors: None Employment / Job issues: None Family Relationships: Has family in the area. They are supportive. Financial / Lack of resources (include bankruptcy): No challenges. Housing / Lack of housing: Has stable housing. Physical health (include injuries & life threatening diseases): Hypertension, pre-diabetic, anxiety Social relationships: Family and friends Substance abuse: Marijuana Bereavement / Loss: Mother passed away 03/15/24.  Living/Environment/Situation:  Living Arrangements: Alone Living conditions (as described by patient or guardian): Single family home Who else lives in the home?: No one How long has patient lived in current situation?: 12/06/2025What is atmosphere in current home: Comfortable  Family History:  Marital status: Separated Separated, when?: 2016 What types of issues is patient dealing with in the relationship?: No issues report Additional relationship information: Married 2 7 years. Are you sexually active?: Yes What is your sexual orientation?: Hetersexual Has your sexual activity been affected by drugs, alcohol, medication, or emotional stress?: No Does patient have children?: Yes How many children?: 3 How is patient's relationship with their children?: Pt reports relationship with children  is good. They live nearby.  Childhood History:  By whom was/is the patient raised?: Both parents Description of patient's relationship with caregiver when they were a child: Pt reports having a  great relationship with his mother prior to her passing. Patient's description of current relationship with people who raised him/her: Father passed in 2014. Pt reports good relationship with both parents. How were you disciplined when you got in trouble as a child/adolescent?: "We got whoopings" Does patient have siblings?: Yes Did patient suffer any verbal/emotional/physical/sexual abuse as a child?: No Did patient suffer from severe childhood neglect?: No Has patient ever been sexually abused/assaulted/raped as an adolescent or adult?: No Was the patient ever a victim of a crime or a disaster?: No Witnessed domestic violence?: No Has patient been affected by domestic violence as an adult?: No  Education:  Highest grade of school patient has completed: 10th Currently a student?: No Learning disability?: Yes What learning problems does patient have?: Can not read or write.  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 Resources:   Financial resources: [employer], Medicare Does patient have a Lawyer or guardian?: No  Alcohol/Substance Abuse:   What has been your use of drugs/alcohol within the last 12 months?: Just marijuana If attempted suicide, did drugs/alcohol play a role in this?: No Has alcohol/substance abuse ever caused legal problems?: No  Social Support System:   Patient's Community Support System: Good Describe Community Support System: Family and friends Type of faith/religion: Baptist How does patient's faith help to cope with current illness?: Unsure  Leisure/Recreation:   Do You Have Hobbies?: Yes Leisure and Hobbies: Refinishing  furniture  Strengths/Needs:   What is the patient's  perception of their strengths?: Refinishing furniture Patient states they can use these personal strengths during their treatment to contribute to their recovery: It helps the pt take his mind off of things. Patient states these barriers may affect/interfere with their treatment: NA Patient states these barriers may affect their return to the community: NA  Discharge Plan:   Currently receiving community mental health services: No Patient states concerns and preferences for aftercare planning are: NA Patient states they will know when they are safe and ready for discharge when: "I really don't know" Does patient have access to transportation?: No Does patient have financial barriers related to discharge medications?: Yes Will patient be returning to same living situation after discharge?: Yes  Summary/Recommendations:   Summary and Recommendations (to be completed by the evaluator): Jamie [name] is a 53 y.o. male.     Patient is a 53 year old male brought by EMS after an overdose of his clonidine.  I am told that he took 100 of his 0.1 mg tablets.  Patient not very forthcoming about why he did this, but does admit to being depressed recently.  He denies alcohol or other drug use.  Patient bradycardic with heart rate in the 30s upon presentation.  He has no other complaints.  Kalkidan Caudell A Chucky [employer], LCSWA 07/22/2023

## 2023-07-22 NOTE — BH Assessment (Signed)
 Patient admitted to room 306-2 voluntary. Pt came to [facility] on 04-09 for overdose on 100 0.1mg  clonidine. Pt had to stay in hospital until his pulse came out of the 30s. Pt is now stable and here with us . He is 52yo, He walks and talks without problems. Hears and sees without difficulty. Pt does have a front partial dentures but did not bring them. Patients admit papers explained and some read to him because he states he can not read. Skin assessment unremarkable. Pt has needle and IV marks on his arms from hospital stay. He has scars on elbows and knees for long time ago surgery. Pt denies any SI/HI and AVH, states he does have a little depression and anxiety. Pt oriented to room, unit, and schedule for the morning. Pt ate a sandwich and chips after he got here and went to bed. Pt did verbally contract for saftey

## 2023-07-22 NOTE — Plan of Care (Signed)

## 2023-07-23 ENCOUNTER — Encounter (HOSPITAL_COMMUNITY): Payer: Self-pay | Admitting: Nurse Practitioner

## 2023-07-23 ENCOUNTER — Encounter (HOSPITAL_COMMUNITY): Payer: Self-pay

## 2023-07-23 DIAGNOSIS — F332 Major depressive disorder, recurrent severe without psychotic features: Secondary | ICD-10-CM | POA: Diagnosis not present

## 2023-07-23 LAB — LIPID PANEL
Cholesterol: 108 mg/dL (ref 0–200)
HDL: 39 mg/dL — ABNORMAL LOW (ref >40)
LDL Cholesterol: 58 mg/dL (ref 0–99)
Total CHOL/HDL Ratio: 2.8 ratio
Triglycerides: 57 mg/dL (ref <150)
VLDL: 11 mg/dL (ref 0–40)

## 2023-07-23 LAB — TSH: TSH: 1.567 u[IU]/mL (ref 0.350–4.500)

## 2023-07-23 MED ORDER — GABAPENTIN 100 MG PO CAPS
200.0000 mg | ORAL_CAPSULE | Freq: Three times a day (TID) | ORAL | Status: DC
  Administered 2023-07-23 – 2023-07-26 (×10): 200 mg via ORAL
  Filled 2023-07-23 (×16): qty 2

## 2023-07-23 MED ORDER — SERTRALINE HCL 50 MG PO TABS
50.0000 mg | ORAL_TABLET | Freq: Every day | ORAL | Status: DC
  Administered 2023-07-24: 50 mg via ORAL
  Filled 2023-07-23 (×2): qty 1

## 2023-07-23 MED ORDER — SERTRALINE HCL 25 MG PO TABS
25.0000 mg | ORAL_TABLET | Freq: Once | ORAL | Status: AC
  Administered 2023-07-23: 25 mg via ORAL
  Filled 2023-07-23 (×2): qty 1

## 2023-07-23 MED ORDER — AMLODIPINE BESYLATE 5 MG PO TABS
5.0000 mg | ORAL_TABLET | Freq: Every day | ORAL | Status: DC
  Administered 2023-07-23 – 2023-07-25 (×3): 5 mg via ORAL
  Filled 2023-07-23 (×5): qty 1

## 2023-07-23 MED ORDER — CLONIDINE HCL 0.1 MG PO TABS
0.1000 mg | ORAL_TABLET | Freq: Three times a day (TID) | ORAL | Status: DC | PRN
  Administered 2023-07-23 (×2): 0.1 mg via ORAL
  Filled 2023-07-23 (×3): qty 1

## 2023-07-23 NOTE — Progress Notes (Signed)
 [facility] MD Progress Note  07/23/2023 11:43 AM Jamie [name]  MRN:  [id number] Subjective:  Patient states his goal is "to get back to feeling how I was before I started feeling depressed. " Patient denies SI or HI today but admits to depressed mood, anhedonia. States he regrets suicide attempt. Denies wanting to be dead and states he wants to live for his family. Admits to intentional overdose. Reports tolerating Zoloft well and requesting increased dose to 50 mg. Patient also requests restarting gabapentin for chronic pain. States this helped previously and he was seeing pain management 2 months ago but missed last pain management appointment.   Principal Problem: Major depressive disorder, recurrent severe without psychotic features (HCC) Diagnosis: Principal Problem:   Major depressive disorder, recurrent severe without psychotic features (HCC) Active Problems:   Substance induced mood disorder (HCC)   Current severe episode of major depressive disorder without psychotic features (HCC)  Total Time spent with patient: 30 minutes  Past Psychiatric History:  He reports a past psychiatric history significant for depression and anxiety. He reports no history of prior suicide attempts. He reports no history of self-injurious behavior. He reports no prior history of psychiatric hospitalizations.   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   Hypertension    "Dr Sudie Bailey took me off meds"  diet control   Lumbar radiculopathy    Pre-diabetes     Past Surgical History:  Procedure Laterality Date   BACK SURGERY     CARPAL TUNNEL RELEASE Right 06/25/2013   Procedure: CARPAL TUNNEL RELEASE;  Surgeon:  Vickki Hearing, MD;  Location: [facility];  Service: Orthopedics;  Laterality: Right;   CARPAL TUNNEL RELEASE Left 07/25/2013   Procedure: LEFT CARPAL TUNNEL RELEASE;  Surgeon: Vickki Hearing, MD;  Location: [facility];  Service: Orthopedics;  Laterality: Left;   COLONOSCOPY WITH PROPOFOL N/A 04/05/2017   Surgeon: Corbin Ade, MD; normal exam. Recommended 5 year repeat due to family history   COLONOSCOPY WITH PROPOFOL N/A 02/13/2022   Procedure: COLONOSCOPY WITH PROPOFOL;  Surgeon: Corbin Ade, MD;  [facility]: [facility];  Service: Endoscopy;  Laterality: N/A;  9:15a,. asa 2   ELBOW SURGERY Left    ESOPHAGOGASTRODUODENOSCOPY (EGD) WITH PROPOFOL N/A 02/13/2022   Procedure: ESOPHAGOGASTRODUODENOSCOPY (EGD) WITH PROPOFOL;  Surgeon: Corbin Ade, MD;  [facility];  Service: Endoscopy;  Laterality: N/A;   FOOT SURGERY Right    HERNIA REPAIR Right    inguinal- age 52   I & D EXTREMITY Left 06/12/2021   Procedure: IRRIGATION AND DEBRIDEMENT;  Surgeon: Bradly Bienenstock, MD;  Location: [facility];  Service: Orthopedics;  Laterality: Left;   KNEE ARTHROSCOPY WITH MEDIAL MENISECTOMY Left 12/07/2016   Procedure: KNEE ARTHROSCOPY WITH MEDIAL MENISECTOMY;  Surgeon: Vickki Hearing, MD;  Location: [facility];  Service: Orthopedics;  Laterality: Left;   KNEE SURGERY     left elbow     LUMBAR LAMINECTOMY/DECOMPRESSION MICRODISCECTOMY Left 10/06/2013   Procedure: Left Lumbar Three-four microdiskectomy;  Surgeon: Cristi Loron, MD;  Location: [facility];  Service: Neurosurgery;  Laterality: Left;  Left Lumbar  Three-four microdiskectomy   OPEN REDUCTION INTERNAL FIXATION (ORIF) HAND Left 06/12/2021   Procedure: OPEN REDUCTION INTERNAL FIXATION (ORIF) HAND;  Surgeon: Bradly Bienenstock, MD;  Location: [facility] OR;  Service: Orthopedics;  Laterality: Left;   POLYPECTOMY  02/13/2022   Procedure: POLYPECTOMY;  Surgeon: Corbin Ade, MD;  [facility]: [facility];  Service: Endoscopy;;   REPAIR OF RUPTURED  PATELLA LIGAMENT Left 05/03/2020   Procedure: LEFT KNEE PATELLECTOMY;  Surgeon: Gean Birchwood, MD;  Location: [facility];  Service: Orthopedics;  Laterality: Left;   right foot     forgein body removal   right knee  orif right patella Keeling 1993   SEPTOPLASTY     TOTAL KNEE ARTHROPLASTY Left 02/18/2019   Procedure: TOTAL KNEE ARTHROPLASTY;  Surgeon: Vickki Hearing, MD;  Location: [facility];  Service: Orthopedics;  Laterality: Left;   Family History:  Family History  Problem Relation Age of Onset   Heart disease Other    Arthritis Other    Cancer Other    Asthma Other    Diabetes Other    Kidney disease Other    Colon cancer Father 41   Family Psychiatric  History: Younger Brother- EtOH Abuse No Known Diagnosis' or Suicides Social History:  Social History   Substance and Sexual Activity  Alcohol Use No     Social History   Substance and Sexual Activity  Drug Use Yes   Frequency: 2.0 times per week   Types: Marijuana   Comment: 3 to 4 days ago last use    Social History   Socioeconomic History   Marital status: Divorced    Spouse name: is seperated   Number of children: Not on file   Years of education: 8th grade    Highest education level: Not on file  Occupational History   Occupation: unemployed    Associate Professor: unemployed  Tobacco Use   Smoking status: Some Days    Current packs/day: 1.00    Average packs/day: 1 pack/day for 15.0 years (15.0 ttl pk-yrs)    Types: Cigarettes   Smokeless tobacco: Former    Types: Chew    Quit date: 12/05/1990   Tobacco comments:    one pack a week  Vaping Use   Vaping status: Never Used  Substance and Sexual Activity   Alcohol use: No   Drug use: Yes    Frequency: 2.0 times per week    Types: Marijuana    Comment: 3 to 4 days ago last use   Sexual activity: Never    Birth control/protection: None  Other Topics Concern   Not on file  Social History Narrative   Not on file   Social Drivers of Health   Financial  Resource Strain: Not on file  Food Insecurity: No Food Insecurity (07/22/2023)   Hunger Vital Sign    Worried About Running Out of Food in the Last Year: Never true    Ran Out of Food in the Last Year: Never true  Transportation Needs: No Transportation Needs (07/22/2023)   PRAPARE - Administrator, Civil Service (Medical): No    Lack of Transportation (Non-Medical): No  Physical Activity: Not on file  Stress: Not on file  Social Connections: Not on file   Additional Social History:                         Sleep: Fair  Appetite:  Fair  Current Medications: Current Facility-Administered Medications  Medication  Dose Route Frequency Provider Last Rate Last Admin   acetaminophen (TYLENOL) tablet 650 mg  650 mg Oral Q6H PRN Bobbitt, Shalon E, NP   650 mg at 07/23/23 0809   alum & mag hydroxide-simeth (MAALOX/MYLANTA) 200-200-20 MG/5ML suspension 30 mL  30 mL Oral Q4H PRN Bobbitt, Shalon E, NP       amLODipine (NORVASC) tablet 5 mg  5 mg Oral Daily Parker, Alvin S, MD   5 mg at 07/23/23 2956   aspirin EC tablet 81 mg  81 mg Oral Daily Pashayan, Alexander S, MD   81 mg at 07/23/23 0805   haloperidol (HALDOL) tablet 5 mg  5 mg Oral TID PRN Bobbitt, Shalon E, NP   5 mg at 07/22/23 1015   And   diphenhydrAMINE (BENADRYL) capsule 50 mg  50 mg Oral TID PRN Bobbitt, Shalon E, NP       haloperidol lactate (HALDOL) injection 5 mg  5 mg Intramuscular TID PRN Bobbitt, Shalon E, NP       And   diphenhydrAMINE (BENADRYL) injection 50 mg  50 mg Intramuscular TID PRN Bobbitt, Shalon E, NP       And   LORazepam (ATIVAN) injection 2 mg  2 mg Intramuscular TID PRN Bobbitt, Shalon E, NP       haloperidol lactate (HALDOL) injection 10 mg  10 mg Intramuscular TID PRN Bobbitt, Shalon E, NP       And   diphenhydrAMINE (BENADRYL) injection 50 mg  50 mg Intramuscular TID PRN Bobbitt, Shalon E, NP       And   LORazepam (ATIVAN) injection 2 mg  2 mg Intramuscular TID PRN Bobbitt, Shalon E,  NP       gabapentin (NEURONTIN) capsule 200 mg  200 mg Oral TID Amyah Clawson, MD       hydrochlorothiazide (HYDRODIURIL) tablet 25 mg  25 mg Oral Daily Pashayan, Alexander S, MD   25 mg at 07/23/23 2130   hydrOXYzine (ATARAX) tablet 25 mg  25 mg Oral TID PRN Bobbitt, Shalon E, NP   25 mg at 07/23/23 8657   hydrOXYzine (ATARAX) tablet 50 mg  50 mg Oral QHS PRN Pashayan, Alexander S, MD       magnesium hydroxide (MILK OF MAGNESIA) suspension 30 mL  30 mL Oral Daily PRN Bobbitt, Shalon E, NP       pantoprazole (PROTONIX) EC tablet 40 mg  40 mg Oral Daily Pashayan, Alexander S, MD   40 mg at 07/23/23 0805   sertraline (ZOLOFT) tablet 25 mg  25 mg Oral Once Vilas Edgerly, MD       [START ON 07/24/2023] sertraline (ZOLOFT) tablet 50 mg  50 mg Oral Daily Dekota Shenk, MD       tamsulosin (FLOMAX) capsule 0.4 mg  0.4 mg Oral Daily Pashayan, Knute Perla, MD   0.4 mg at 07/22/23 1203    Lab Results:  Results for orders placed or performed during the hospital encounter of 07/21/23 (from the past 48 hours)  Lipid panel     Status: Abnormal   Collection Time: 07/23/23  6:27 AM  Result Value Ref Range   Cholesterol 108 0 - 200 mg/dL   Triglycerides 57 <846 mg/dL   HDL 39 (L) >96 mg/dL   Total CHOL/HDL Ratio 2.8 RATIO   VLDL 11 0 - 40 mg/dL   LDL Cholesterol 58 0 - 99 mg/dL    Comment:        Total Cholesterol/HDL:CHD Risk Coronary Heart Disease Risk  Table                     Men   Women  1/2 Average Risk   3.4   3.3  Average Risk       5.0   4.4  2 X Average Risk   9.6   7.1  3 X Average Risk  23.4   11.0        Use the calculated Patient Ratio above and the CHD Risk Table to determine the patient's CHD Risk.        ATP III CLASSIFICATION (LDL):  <100     mg/dL   Optimal  962-952  mg/dL   Near or Above                    Optimal  130-159  mg/dL   Borderline  841-324  mg/dL   High  >401     mg/dL   Very High Performed at [facility], [address] [place],  [place] [address]   TSH     Status: None   Collection Time: 07/23/23  6:27 AM  Result Value Ref Range   TSH 1.567 0.350 - 4.500 uIU/mL    Comment: Performed by a 3rd Generation assay with a functional sensitivity of <=0.01 uIU/mL. Performed at [facility], [address], [place], [place] [address]     Blood Alcohol level:  Lab Results  Component Value Date   ETH <10 07/18/2023   ETH  11/17/2007    <5        LOWEST DETECTABLE LIMIT FOR SERUM ALCOHOL IS 11 mg/dL FOR MEDICAL PURPOSES ONLY    Metabolic Disorder Labs: Lab Results  Component Value Date   HGBA1C 5.0 04/29/2020   MPG 96.8 04/29/2020   MPG 105.41 02/14/2019   No results found for: "PROLACTIN" Lab Results  Component Value Date   CHOL 108 07/23/2023   TRIG 57 07/23/2023   HDL 39 (L) 07/23/2023   CHOLHDL 2.8 07/23/2023   VLDL 11 07/23/2023   LDLCALC 58 07/23/2023   LDLCALC  06/10/2010    79        Total Cholesterol/HDL:CHD Risk Coronary Heart Disease Risk Table                     Men   Women  1/2 Average Risk   3.4   3.3  Average Risk       5.0   4.4  2 X Average Risk   9.6   7.1  3 X Average Risk  23.4   11.0        Use the calculated Patient Ratio above and the CHD Risk Table to determine the patient's CHD Risk.        ATP III CLASSIFICATION (LDL):  <100     mg/dL   Optimal  034-742  mg/dL   Near or Above                    Optimal  130-159  mg/dL   Borderline  595-638  mg/dL   High  >756     mg/dL   Very High      Musculoskeletal: Strength & Muscle Tone: within normal limits Gait & Station: normal Patient leans: N/A  Psychiatric Specialty Exam:  Presentation  General Appearance:  Appropriate for Environment  Eye Contact: Fair  Speech: Clear and Coherent  Speech Volume: Decreased  Handedness:No  data recorded  Mood and Affect  Mood: Dysphoric  Affect: Depressed; Congruent   Thought Process  Thought Processes: Coherent  Descriptions of  Associations:Intact  Orientation:Full (Time, Place and Person)  Thought Content:WDL  History of Schizophrenia/Schizoaffective disorder:No data recorded Duration of Psychotic Symptoms:No data recorded Hallucinations:Hallucinations: None  Ideas of Reference:None  Suicidal Thoughts:Suicidal Thoughts: No  Homicidal Thoughts:Homicidal Thoughts: No   Sensorium  Memory: Immediate Fair  Judgment: Fair  Insight: Fair   Art therapist  Concentration: Fair  Attention Span: Fair  Recall: [employer] of Knowledge: Fair  Language: Fair   Psychomotor Activity  Psychomotor Activity: Psychomotor Activity: Decreased   Assets  Assets: Communication Skills; Desire for Improvement   Sleep  Sleep: Sleep: Fair    Physical Exam: Physical exam: Please see exam on admit note. General: Well developed, well nourished.  Pupils: Normal at 3mm Respiratory: Breathing is unlabored.  Cardiovascular: No edema.  Language: No anomia, no aphasia Muscle strength and tone-pt moving all extremities.  Gait not assessed as pt remained in bed.  Neuro: Facial muscles are symmetric. Pt without tremor, no evidence of hyperarousal.  Review of Systems  Constitutional: Negative.   HENT: Negative.    Eyes: Negative.   Respiratory: Negative.    Cardiovascular: Negative.   Gastrointestinal: Negative.   Genitourinary: Negative.   Musculoskeletal:  Positive for joint pain and myalgias.  Skin: Negative.   Neurological: Negative.   Endo/Heme/Allergies: Negative.   Psychiatric/Behavioral:  Positive for depression. The patient is nervous/anxious.    Blood pressure (!) 165/110, pulse 85, temperature 97.8 F (36.6 C), temperature source Oral, resp. rate 18, height 5\' 11"  (1.803 m), weight 86.2 kg, SpO2 100%. Body mass index is 26.5 kg/m.   Treatment Plan Summary: Jiro Kiester. Spalla is a 53 yr old male who presented on 4/9 to [facility] after a Suicide Attempt via OD (100x 0.1 mg Clonidine  tablets), he was admitted to [facility] for treatment, he was admitted to [facility] on 4/13.  PPHx is significant for Depression and Anxiety, and no Prior Suicide Attempts, Self Injurious Behavior, or Psychiatric Hospitalizations.    4/14Hilario Lover reporting significant depression, anxiety and hopelessness. Denies SI or HI today. Regrets suicide attempts and denies wanting to be dead. Want to be present for remaining family. Attending groups. Requesting increased Zoloft dosing and gabapentin being restarted for chronic pain. Patient remains at high acute risk for suicide s/p intention OD attempt.    Observation Level/Precautions:  15 minute checks  Laboratory:  CMP: WNL except Na: 134,  AST: 13,  Alk Phos: 35,  CBC: WNL, EtOH/Acetaminophen/Salicylate: WNL,  UDS: Cocaine and THC pos, EKG: Sinus Bradycardia with Qtc: 433.  Psychotherapy:    Medications:  Zoloft  Consultations:    Discharge Concerns:    Estimated LOS: 3-5 days  Other:      Physician Treatment Plan for Primary Diagnosis: Major depressive disorder, recurrent severe without psychotic features (HCC) Long Term Goal(s): Improvement in symptoms so as ready for discharge   Short Term Goals: Ability to verbalize feelings will improve, Ability to maintain clinical measurements within normal limits will improve, and Ability to identify triggers associated with substance abuse/mental health issues will improve   Physician Treatment Plan for Secondary Diagnosis: Principal Problem:   Major depressive disorder, recurrent severe without psychotic features (HCC) Active Problems:   Substance induced mood disorder (HCC)   Long Term Goal(s): Improvement in symptoms so as ready for discharge   Short Term Goals: Ability to verbalize feelings will improve,  Ability to maintain clinical measurements within normal limits will improve, and Ability to identify triggers associated with substance abuse/mental health issues will improve   I certify that  inpatient services furnished can reasonably be expected to improve the patient's condition.      MDD, Recurrent, Severe, w/out Psychosis  GAD: -increase Zoloft to 50 mg daily for depression and anxiety. -Continue Agitation Protocol: Haldol/Ativan/Benadryl    Patient with likely rebound HTN from Clonidine discontinuation; restart Clonidine 0.1 mg q8h-PRN for SBP>179 or DBP>109, ordered q8 vital signs, patient is currently asymptomatic (denies chest pain, headaches, blurred vision, nausea)  - morning provider start amlodipine 5 mg qdaily today -cont Aspirin EC 81 mg daily -cont hydrochlorothiazide 25 mg daily -cont Protonix 40 mg daily -cont Flomax 0.4 mg daily -cont Hydroxyzine 50 mg QHS PRN insomnia -Continue PRN's: Tylenol, Maalox, Atarax, Milk of Magnesia     Daily contact with patient to assess and evaluate symptoms and progress in treatment, Medication management, and Plan increase Zoloft to 50 mg qdaily and start gabapentin 200 mg TID for anxiety and neuropathy     Teresa Lemmerman, MD 07/23/2023, 11:43 AM

## 2023-07-23 NOTE — Group Note (Signed)
 Date:  07/23/2023 Time:  9:14 PM  Group Topic/Focus:  Alcoholics Anonymous (AA) Meeting    Participation Level:  Active  Participation Quality:  Appropriate  Affect:  Appropriate  Cognitive:  Appropriate  Insight: Appropriate  Engagement in Group:  Engaged  Modes of Intervention:  Socialization and Support  Additional Comments:  Patient attended AA  Dillard Frame 07/23/2023, 9:14 PM

## 2023-07-23 NOTE — Progress Notes (Signed)
D:  Patient denied SI and HI, contracts for safety.  Denied A/V hallucinations.   A:  Medications administered per MD orders.  Emotional support and encouragement given patient. R:  Safety maintained with 15 minute checks.  

## 2023-07-23 NOTE — [facility] Group Notes (Signed)
 Spiritual care group on grief and loss facilitated by Chaplain Nick Barman, [facility]  Group Goal: Support / Education around grief and loss  Members engage in facilitated group support and psycho-social education.  Group Description:  Following introductions and group rules, group members engaged in facilitated group dialogue and support around topic of loss, with particular support around experiences of loss in their lives. Group Identified types of loss (relationships / self / things) and identified patterns, circumstances, and changes that precipitate losses. Reflected on thoughts / feelings around loss, normalized grief responses, and recognized variety in grief experience. Group encouraged individual reflection on safe space and on the coping skills that they are already utilizing.  Group drew on Adlerian / Rogerian and narrative framework  Patient Progress: Jamie [name] attended group and actively engaged and participated in group conversation and activities.

## 2023-07-23 NOTE — BH IP Treatment Plan (Signed)
 Interdisciplinary Treatment and Diagnostic Plan Update  07/23/2023 Time of Session: 1132 Jamie [name] MRN: [id number]  Principal Diagnosis: Major depressive disorder, recurrent severe without psychotic features [facility])  Secondary Diagnoses: Principal Problem:   Major depressive disorder, recurrent severe without psychotic features (HCC) Active Problems:   Substance induced mood disorder (HCC)   Current severe episode of major depressive disorder without psychotic features (HCC)   Current Medications:  Current Facility-Administered Medications  Medication Dose Route Frequency Provider Last Rate Last Admin   acetaminophen (TYLENOL) tablet 650 mg  650 mg Oral Q6H PRN Bobbitt, Shalon E, NP   650 mg at 07/23/23 0809   alum & mag hydroxide-simeth (MAALOX/MYLANTA) 200-200-20 MG/5ML suspension 30 mL  30 mL Oral Q4H PRN Bobbitt, Shalon E, NP       amLODipine (NORVASC) tablet 5 mg  5 mg Oral Daily Parker, Alvin S, MD   5 mg at 07/23/23 4166   aspirin EC tablet 81 mg  81 mg Oral Daily Pashayan, Alexander S, MD   81 mg at 07/23/23 0805   cloNIDine (CATAPRES) tablet 0.1 mg  0.1 mg Oral Q8H PRN Zouev, Dmitri, MD   0.1 mg at 07/23/23 1432   haloperidol (HALDOL) tablet 5 mg  5 mg Oral TID PRN Bobbitt, Shalon E, NP   5 mg at 07/22/23 1015   And   diphenhydrAMINE (BENADRYL) capsule 50 mg  50 mg Oral TID PRN Bobbitt, Shalon E, NP       haloperidol lactate (HALDOL) injection 5 mg  5 mg Intramuscular TID PRN Bobbitt, Shalon E, NP       And   diphenhydrAMINE (BENADRYL) injection 50 mg  50 mg Intramuscular TID PRN Bobbitt, Shalon E, NP       And   LORazepam (ATIVAN) injection 2 mg  2 mg Intramuscular TID PRN Bobbitt, Shalon E, NP       haloperidol lactate (HALDOL) injection 10 mg  10 mg Intramuscular TID PRN Bobbitt, Shalon E, NP       And   diphenhydrAMINE (BENADRYL) injection 50 mg  50 mg Intramuscular TID PRN Bobbitt, Shalon E, NP       And   LORazepam (ATIVAN) injection 2 mg  2 mg Intramuscular TID  PRN Bobbitt, Shalon E, NP       gabapentin (NEURONTIN) capsule 200 mg  200 mg Oral TID Zouev, Dmitri, MD   200 mg at 07/23/23 1252   hydrochlorothiazide (HYDRODIURIL) tablet 25 mg  25 mg Oral Daily Pashayan, Alexander S, MD   25 mg at 07/23/23 0804   hydrOXYzine (ATARAX) tablet 25 mg  25 mg Oral TID PRN Bobbitt, Shalon E, NP   25 mg at 07/23/23 0630   hydrOXYzine (ATARAX) tablet 50 mg  50 mg Oral QHS PRN Pashayan, Alexander S, MD       magnesium hydroxide (MILK OF MAGNESIA) suspension 30 mL  30 mL Oral Daily PRN Bobbitt, Shalon E, NP       pantoprazole (PROTONIX) EC tablet 40 mg  40 mg Oral Daily Pashayan, Alexander S, MD   40 mg at 07/23/23 0805   [START ON 07/24/2023] sertraline (ZOLOFT) tablet 50 mg  50 mg Oral Daily Zouev, Dmitri, MD       tamsulosin (FLOMAX) capsule 0.4 mg  0.4 mg Oral Daily Pashayan, Alexander S, MD   0.4 mg at 07/22/23 1203   PTA Medications: Medications Prior to Admission  Medication Sig Dispense Refill Last Dose/Taking   aspirin EC 81 MG tablet Take 1  tablet (81 mg total) by mouth daily. Swallow whole. 90 tablet 3    citalopram (CELEXA) 20 MG tablet Take 20 mg by mouth daily. Take with 40 mg to equal 60 mg      diclofenac Sodium (VOLTAREN) 1 % GEL Apply 1 application topically in the morning, at noon, in the evening, and at bedtime.      hydrochlorothiazide (HYDRODIURIL) 25 MG tablet Take 25 mg by mouth daily.       mirtazapine (REMERON) 7.5 MG tablet Take 1 tablet (7.5 mg total) by mouth at bedtime.      naloxone (NARCAN) nasal spray 4 mg/0.1 mL as needed. overdose      pantoprazole (PROTONIX) 40 MG tablet Take 1 tablet by mouth daily.      tamsulosin (FLOMAX) 0.4 MG CAPS capsule Take 1 capsule (0.4 mg total) by mouth daily.       Patient Stressors:    Patient Strengths:    Treatment Modalities: Medication Management, Group therapy, Case management,  1 to 1 session with clinician, Psychoeducation, Recreational therapy.   Physician Treatment Plan for Primary  Diagnosis: Major depressive disorder, recurrent severe without psychotic features (HCC) Long Term Goal(s): Improvement in symptoms so as ready for discharge   Short Term Goals: Ability to verbalize feelings will improve Ability to maintain clinical measurements within normal limits will improve Ability to identify triggers associated with substance abuse/mental health issues will improve  Medication Management: Evaluate patient's response, side effects, and tolerance of medication regimen.  Therapeutic Interventions: 1 to 1 sessions, Unit Group sessions and Medication administration.  Evaluation of Outcomes: Not Progressing  Physician Treatment Plan for Secondary Diagnosis: Principal Problem:   Major depressive disorder, recurrent severe without psychotic features (HCC) Active Problems:   Substance induced mood disorder (HCC)   Current severe episode of major depressive disorder without psychotic features (HCC)  Long Term Goal(s): Improvement in symptoms so as ready for discharge   Short Term Goals: Ability to verbalize feelings will improve Ability to maintain clinical measurements within normal limits will improve Ability to identify triggers associated with substance abuse/mental health issues will improve     Medication Management: Evaluate patient's response, side effects, and tolerance of medication regimen.  Therapeutic Interventions: 1 to 1 sessions, Unit Group sessions and Medication administration.  Evaluation of Outcomes: Not Progressing   RN Treatment Plan for Primary Diagnosis: Major depressive disorder, recurrent severe without psychotic features (HCC) Long Term Goal(s): Knowledge of disease and therapeutic regimen to maintain health will improve  Short Term Goals: Ability to demonstrate self-control, Ability to participate in decision making will improve, Ability to disclose and discuss suicidal ideas, and Compliance with prescribed medications will  improve  Medication Management: RN will administer medications as ordered by provider, will assess and evaluate patient's response and provide education to patient for prescribed medication. RN will report any adverse and/or side effects to prescribing provider.  Therapeutic Interventions: 1 on 1 counseling sessions, Psychoeducation, Medication administration, Evaluate responses to treatment, Monitor vital signs and CBGs as ordered, Perform/monitor CIWA, COWS, AIMS and Fall Risk screenings as ordered, Perform wound care treatments as ordered.  Evaluation of Outcomes: Not Progressing   LCSW Treatment Plan for Primary Diagnosis: Major depressive disorder, recurrent severe without psychotic features (HCC) Long Term Goal(s): Safe transition to appropriate next level of care at discharge, Engage patient in therapeutic group addressing interpersonal concerns.  Short Term Goals: Engage patient in aftercare planning with referrals and resources, Increase social support, Increase ability to appropriately verbalize feelings,  and Increase emotional regulation  Therapeutic Interventions: Assess for all discharge needs, 1 to 1 time with Social worker, Explore available resources and support systems, Assess for adequacy in community support network, Educate family and significant other(s) on suicide prevention, Complete Psychosocial Assessment, Interpersonal group therapy.  Evaluation of Outcomes: Not Progressing   Progress in Treatment: Attending groups: Yes. Participating in groups: Yes. Taking medication as prescribed: Yes. Toleration medication: Yes. Family/Significant other contact made: No, will contact:  Assessment pending Patient understands diagnosis: Yes. Discussing patient identified problems/goals with staff: Yes. Medical problems stabilized or resolved: Yes. Denies suicidal/homicidal ideation: Yes. Issues/concerns per patient self-inventory: No. Other: N/a  New problem(s) identified:  No, Describe:  None  New Short Term/Long Term Goal(s): medication stabilization, elimination of SI thoughts, development of comprehensive mental wellness plan.   Patient Goals: "Get better and back to myself"  Discharge Plan or Barriers: Patient recently admitted. CSW will continue to follow and assess for appropriate referrals and possible discharge planning.    Reason for Continuation of Hospitalization: Anxiety Depression Medication stabilization Other; describe Mood stabilization, discharge planning  Estimated Length of Stay:  Last 3 [place] Suicide Severity Risk Score: Flowsheet Row [facility] ([facility]-Admission (Discharged) from 07/18/2023 in [facility] Admission (Discharged) from 02/13/2022 in [place] ENDOSCOPY  C-SSRS RISK CATEGORY No Risk No Risk No Risk       Last PHQ 2/9 Scores:     No data to display          Scribe for Treatment Team: Macrina Lehnert N Jigar Zielke, LCSW 07/23/2023 4:41 PM

## 2023-07-23 NOTE — Group Note (Signed)
 Date:  07/23/2023 Time:  9:36 AM  Group Topic/Focus:  Goals Group:   The focus of this group is to help patients establish daily goals to achieve during treatment and discuss how the patient can incorporate goal setting into their daily lives to aide in recovery.    Participation Level:  Active  Participation Quality:  Appropriate  Affect:  Appropriate   Jamie [name] 07/23/2023, 9:36 AM

## 2023-07-23 NOTE — Group Note (Signed)
 Recreation Therapy Group Note   Group Topic:Stress Management  Group Date: 07/23/2023 Start Time: 0931 End Time: 0954 Facilitators: Salvatrice Morandi-McCall, LRT,CTRS Location: 300 Hall Dayroom   Group Topic: Stress Management  Goal Area(s) Addresses:  Patient will identify positive stress management techniques. Patient will identify benefits of using stress management post d/c.  Intervention: Insight Timer App  Activity: Meditation. LRT played a meditation for patients that focused on morning energy and focus. Patients were to listen and follow along as the meditation took them on a journey to prepare them for the day ahead.    Education:  Stress Management, Discharge Planning.   Education Outcome: Acknowledges Education   Affect/Mood: Appropriate   Participation Level: Engaged   Participation Quality: Independent   Behavior: Appropriate   Speech/Thought Process: Focused   Insight: Good   Judgement: Good   Modes of Intervention: App   Patient Response to Interventions:  Engaged   Education Outcome:  In group clarification offered    Clinical Observations/Individualized Feedback: Pt attended and participated in meditation session.     Plan: Continue to engage patient in RT group sessions 2-3x/week.   Parisha Beaulac-McCall, LRT,CTRS  07/23/2023 12:35 PM

## 2023-07-23 NOTE — Plan of Care (Signed)
 Nurse discussed anxiety, depression and coping skills with patient.

## 2023-07-24 DIAGNOSIS — F332 Major depressive disorder, recurrent severe without psychotic features: Secondary | ICD-10-CM | POA: Diagnosis not present

## 2023-07-24 LAB — HEMOGLOBIN A1C
Hgb A1c MFr Bld: 5.3 % (ref 4.8–5.6)
Mean Plasma Glucose: 105 mg/dL

## 2023-07-24 MED ORDER — SERTRALINE HCL 100 MG PO TABS
100.0000 mg | ORAL_TABLET | Freq: Every day | ORAL | Status: DC
  Administered 2023-07-25 – 2023-07-26 (×2): 100 mg via ORAL
  Filled 2023-07-24 (×4): qty 1

## 2023-07-24 MED ORDER — NICOTINE POLACRILEX 2 MG MT GUM
2.0000 mg | CHEWING_GUM | OROMUCOSAL | Status: DC | PRN
  Administered 2023-07-24 – 2023-07-26 (×8): 2 mg via ORAL
  Filled 2023-07-24 (×6): qty 1

## 2023-07-24 MED ORDER — NICOTINE 14 MG/24HR TD PT24
14.0000 mg | MEDICATED_PATCH | Freq: Every day | TRANSDERMAL | Status: DC
  Administered 2023-07-25: 14 mg via TRANSDERMAL
  Filled 2023-07-24 (×4): qty 1

## 2023-07-24 NOTE — Group Note (Signed)
 Date:  07/24/2023 Time:  9:53 AM  Group Topic/Focus:  Goals Group:   The focus of this group is to help patients establish daily goals to achieve during treatment and discuss how the patient can incorporate goal setting into their daily lives to aide in recovery. Orientation:   The focus of this group is to educate the patient on the purpose and policies of crisis stabilization and provide a format to answer questions about their admission.  The group details unit policies and expectations of patients while admitted.    Participation Level:  Active  Participation Quality:  Appropriate  Affect:  Appropriate  Cognitive:  Appropriate  Insight: Good  Engagement in Group:  Engaged  Modes of Intervention:  Discussion  Additional Comments: Pt goal is to work on a after care program.   Almarie Arias 07/24/2023, 9:53 AM

## 2023-07-24 NOTE — Progress Notes (Signed)
   07/24/23 1800  Psych Admission Type (Psych Patients Only)  Admission Status Voluntary  Psychosocial Assessment  Patient Complaints Anxiety  Eye Contact Fair;Watchful  Facial Expression Anxious  Affect Anxious  Speech Logical/coherent  Interaction Assertive  Motor Activity Fidgety  Appearance/Hygiene Unremarkable  Behavior Characteristics Cooperative  Mood Pleasant;Anxious  Thought Process  Coherency WDL  Content WDL  Delusions None reported or observed  Perception WDL  Hallucination None reported or observed  Judgment Impaired  Confusion None  Danger to Self  Current suicidal ideation? Denies  Danger to Others  Danger to Others None reported or observed

## 2023-07-24 NOTE — [facility] Group Notes (Signed)
 [facility] Group Notes:  (Nursing/MHT/Case Management/Adjunct)  Date:  07/24/2023  Time:  9:24 PM  Type of Therapy:   Wrap-up group  Participation Level:  Active  Participation Quality:  Appropriate  Affect:  Appropriate  Cognitive:  Appropriate  Insight:  Appropriate  Engagement in Group:  Engaged  Modes of Intervention:  Education  Summary of Progress/Problems: Goal to make others laugh. Rated day 10/10.   Jamie [name] 07/24/2023, 9:24 PM

## 2023-07-24 NOTE — Progress Notes (Signed)
   07/23/23 2115  Psych Admission Type (Psych Patients Only)  Admission Status Voluntary  Psychosocial Assessment  Patient Complaints Anxiety  Eye Contact Fair  Facial Expression Anxious  Affect Anxious  Speech Logical/coherent  Interaction Assertive  Motor Activity Fidgety  Appearance/Hygiene Unremarkable  Behavior Characteristics Cooperative  Mood Pleasant  Thought Process  Coherency WDL  Content WDL  Delusions None reported or observed  Perception WDL  Hallucination None reported or observed  Judgment Impaired  Confusion None  Danger to Self  Current suicidal ideation? Denies  Danger to Others  Danger to Others None reported or observed

## 2023-07-24 NOTE — Plan of Care (Signed)
   Problem: Activity: Goal: Interest or engagement in activities will improve Outcome: Progressing Goal: Sleeping patterns will improve Outcome: Progressing

## 2023-07-24 NOTE — Group Note (Signed)
 Date:  07/24/2023 Time:  9:38 AM  Group Topic/Focus:  Goals Group:   The focus of this group is to help patients establish daily goals to achieve during treatment and discuss how the patient can incorporate goal setting into their daily lives to aide in recovery. Orientation:   The focus of this group is to educate the patient on the purpose and policies of crisis stabilization and provide a format to answer questions about their admission.  The group details unit policies and expectations of patients while admitted.    Participation Level:  Did Not Attend    Additional Comments:  Did not attend.  Jamie [name] 07/24/2023, 9:38 AM

## 2023-07-24 NOTE — Group Note (Signed)
 LCSW Group Therapy Note   Group Date: 07/24/2023 Start Time: 1100 End Time: 1200  Participation:  patient was present  Type of Therapy:  Group Therapy  Topic:  Stronger Together:  Building Healthy Relationships   Objective:  To explore loneliness, boundaries, and safe ways to build relationships.  Goals: Recognize healthy vs. unhealthy relationships. Learn safe ways to connect with others. Strengthen communication and [employer].  Summary:  Participants discussed loneliness, healthy connections, and setting boundaries. They explored safe ways to meet people and shared personal experiences. Key insights were reinforced through discussion and quotes.  Therapeutic Modalities Used: Cognitive Behavioral Therapy (CBT) Elements - Identifying unhealthy relationship patterns, challenging negative thoughts about connection. Dialectical Behavior Therapy (DBT) Elements - Interpersonal effectiveness, setting and maintaining boundaries. Supportive Group Therapy - Peer discussion, shared experiences, and emotional validation.   Hope Brandenburger O Sye Schroepfer, LCSWA 07/24/2023  5:52 PM

## 2023-07-24 NOTE — Progress Notes (Signed)
 [facility] MD Progress Note  07/24/2023 10:09 AM Jamie [name]  MRN:  [id number]  Chart review and nursing report: Staff report patient denying problems this morning, chart review indicates patient is compliant with medications on the unit.  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  He reports fair sleep and appetite.  Denies side effect of medications and agrees to comply at this time and after discharge.  He reports he quit alcohol use 20 years ago but was using marijuana few times weekly prior to admission, reports using cocaine on and off prior to admission with last use reported by him 2 months ago which is not consistent with him testing positive to both marijuana and cocaine on 4/9, he denies craving to drugs and was counseled regarding need to abstain completely after discharge.  He reports attending groups.  He is tolerating his medicine with no side effects and I discussed with him titrating Zoloft to 100 mg daily starting tomorrow, he agrees.  Principal Problem: Major depressive disorder, recurrent severe without psychotic features (HCC) Diagnosis: Principal Problem:   Major depressive disorder, recurrent severe without psychotic features (HCC) Active Problems:   Substance induced  mood disorder (HCC)   Current severe episode of major depressive disorder without psychotic features (HCC)  Total Time spent with patient: 30 minutes  Past Psychiatric History:  He reports a past psychiatric history significant for depression and anxiety. He reports no history of prior suicide attempts. He reports no history of self-injurious behavior. He reports no prior history of psychiatric hospitalizations.   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   Hypertension    "Dr Hillary Lowing took me off meds"  diet control   Lumbar radiculopathy    Pre-diabetes     Past Surgical History:  Procedure Laterality Date   BACK SURGERY     CARPAL TUNNEL RELEASE Right 06/25/2013   Procedure: CARPAL TUNNEL RELEASE;  Surgeon: Darrin Emerald, MD;  Location: [facility];  Service: Orthopedics;  Laterality: Right;   CARPAL TUNNEL RELEASE Left 07/25/2013   Procedure: LEFT CARPAL TUNNEL RELEASE;  Surgeon: Darrin Emerald, MD;  Location: [facility];  Service: Orthopedics;  Laterality: Left;   COLONOSCOPY WITH PROPOFOL N/A 04/05/2017   Surgeon: Suzette Espy, MD; normal exam. Recommended 5 year repeat due to family history   COLONOSCOPY WITH PROPOFOL N/A 02/13/2022  Procedure: COLONOSCOPY WITH PROPOFOL;  Surgeon: Suzette Espy, MD;  Location: [facility];  Service: Endoscopy;  Laterality: N/A;  9:15a,. asa 2   ELBOW SURGERY Left    ESOPHAGOGASTRODUODENOSCOPY (EGD) WITH PROPOFOL N/A 02/13/2022   Procedure: ESOPHAGOGASTRODUODENOSCOPY (EGD) WITH PROPOFOL;  Surgeon: Suzette Espy, MD;  [facility];  Service: Endoscopy;  Laterality: N/A;   FOOT SURGERY Right    HERNIA REPAIR Right    inguinal- age 52    I & D EXTREMITY Left 06/12/2021   Procedure: IRRIGATION AND DEBRIDEMENT;  Surgeon: Arvil Birks, MD;  Location: [facility];  Service: Orthopedics;  Laterality: Left;   KNEE ARTHROSCOPY WITH MEDIAL MENISECTOMY Left 12/07/2016   Procedure: KNEE ARTHROSCOPY WITH MEDIAL MENISECTOMY;  Surgeon: Darrin Emerald, MD;  Location: [facility];  Service: Orthopedics;  Laterality: Left;   KNEE SURGERY     left elbow     LUMBAR LAMINECTOMY/DECOMPRESSION MICRODISCECTOMY Left 10/06/2013   Procedure: Left Lumbar Three-four microdiskectomy;  Surgeon: Elder Greening, MD;  Location: [facility];  Service: Neurosurgery;  Laterality: Left;  Left Lumbar Three-four microdiskectomy   OPEN REDUCTION INTERNAL FIXATION (ORIF) HAND Left 06/12/2021   Procedure: OPEN REDUCTION INTERNAL FIXATION (ORIF) HAND;  Surgeon: Arvil Birks, MD;  Location: [facility];  Service: Orthopedics;  Laterality: Left;   POLYPECTOMY  02/13/2022   Procedure: POLYPECTOMY;  Surgeon: Suzette Espy, MD;  [facility];  Service: Endoscopy;;   REPAIR OF RUPTURED PATELLA LIGAMENT Left 05/03/2020   Procedure: LEFT KNEE PATELLECTOMY;  Surgeon: Wendolyn Hamburger, MD;  Location: [facility];  Service: Orthopedics;  Laterality: Left;   right foot     forgein body removal   right knee  orif right patella Keeling 1993   SEPTOPLASTY     TOTAL KNEE ARTHROPLASTY Left 02/18/2019   Procedure: TOTAL KNEE ARTHROPLASTY;  Surgeon: Darrin Emerald, MD;  Location: [facility];  Service: Orthopedics;  Laterality: Left;   Family History:  Family History  Problem Relation Age of Onset   Heart disease Other    Arthritis Other    Cancer Other    Asthma Other    Diabetes Other    Kidney disease Other    Colon cancer Father 53   Family Psychiatric  History: Younger Brother- EtOH Abuse No Known Diagnosis' or Suicides Social History:  Social History   Substance and Sexual Activity  Alcohol Use No     Social History   Substance and Sexual Activity  Drug Use Yes    Frequency: 2.0 times per week   Types: Marijuana   Comment: 3 to 4 days ago last use    Social History   Socioeconomic History   Marital status: Divorced    Spouse name: is seperated   Number of children: Not on file   Years of education: 8th grade    Highest education level: Not on file  Occupational History   Occupation: unemployed    Associate Professor: unemployed  Tobacco Use   Smoking status: Some Days    Current packs/day: 1.00    Average packs/day: 1 pack/day for 15.0 years (15.0 ttl pk-yrs)    Types: Cigarettes   Smokeless tobacco: Former    Types: Chew    Quit date: 12/05/1990   Tobacco comments:    one pack a week  Vaping Use   Vaping status: Never Used  Substance and Sexual Activity   Alcohol use: No   Drug use: Yes    Frequency: 2.0 times per  week    Types: Marijuana    Comment: 3 to 4 days ago last use   Sexual activity: Never    Birth control/protection: None  Other Topics Concern   Not on file  Social History Narrative   Not on file   Social Drivers of Health   Financial Resource Strain: Not on file  Food Insecurity: No Food Insecurity (07/22/2023)   Hunger Vital Sign    Worried About Running Out of Food in the Last Year: Never true    Ran Out of Food in the Last Year: Never true  Transportation Needs: No Transportation Needs (07/22/2023)   PRAPARE - Administrator, Civil Service (Medical): No    Lack of Transportation (Non-Medical): No  Physical Activity: Not on file  Stress: Not on file  Social Connections: Not on file   Additional Social History:                         Sleep: Fair  Appetite:  Fair  Current Medications: Current Facility-Administered Medications  Medication Dose Route Frequency Provider Last Rate Last Admin   acetaminophen (TYLENOL) tablet 650 mg  650 mg Oral Q6H PRN Bobbitt, Shalon E, NP   650 mg at 07/24/23 0521   alum & mag hydroxide-simeth (MAALOX/MYLANTA) 200-200-20 MG/5ML suspension 30 mL  30 mL Oral  Q4H PRN Bobbitt, Shalon E, NP       amLODipine (NORVASC) tablet 5 mg  5 mg Oral Daily [place], Alvin S, MD   5 mg at 07/24/23 0740   aspirin EC tablet 81 mg  81 mg Oral Daily Pashayan, Alexander S, MD   81 mg at 07/24/23 0740   cloNIDine (CATAPRES) tablet 0.1 mg  0.1 mg Oral Q8H PRN Zouev, Dmitri, MD   0.1 mg at 07/23/23 2212   haloperidol (HALDOL) tablet 5 mg  5 mg Oral TID PRN Bobbitt, Shalon E, NP   5 mg at 07/22/23 1015   And   diphenhydrAMINE (BENADRYL) capsule 50 mg  50 mg Oral TID PRN Bobbitt, Shalon E, NP       haloperidol lactate (HALDOL) injection 5 mg  5 mg Intramuscular TID PRN Bobbitt, Shalon E, NP       And   diphenhydrAMINE (BENADRYL) injection 50 mg  50 mg Intramuscular TID PRN Bobbitt, Shalon E, NP       And   LORazepam (ATIVAN) injection 2 mg  2 mg Intramuscular TID PRN Bobbitt, Shalon E, NP       haloperidol lactate (HALDOL) injection 10 mg  10 mg Intramuscular TID PRN Bobbitt, Shalon E, NP       And   diphenhydrAMINE (BENADRYL) injection 50 mg  50 mg Intramuscular TID PRN Bobbitt, Shalon E, NP       And   LORazepam (ATIVAN) injection 2 mg  2 mg Intramuscular TID PRN Bobbitt, Shalon E, NP       gabapentin (NEURONTIN) capsule 200 mg  200 mg Oral TID Zouev, Dmitri, MD   200 mg at 07/24/23 0739   hydrochlorothiazide (HYDRODIURIL) tablet 25 mg  25 mg Oral Daily Pashayan, Alexander S, MD   25 mg at 07/24/23 0740   hydrOXYzine (ATARAX) tablet 25 mg  25 mg Oral TID PRN Bobbitt, Shalon E, NP   25 mg at 07/23/23 2212   hydrOXYzine (ATARAX) tablet 50 mg  50 mg Oral QHS PRN Pashayan, Alexander S, MD   50 mg at 07/23/23 2034   magnesium  hydroxide (MILK OF MAGNESIA) suspension 30 mL  30 mL Oral Daily PRN Bobbitt, Shalon E, NP       pantoprazole (PROTONIX) EC tablet 40 mg  40 mg Oral Daily Lauro Franklin, MD   40 mg at 07/24/23 0740   sertraline (ZOLOFT) tablet 50 mg  50 mg Oral Daily Miguel Rota, MD   50 mg at 07/24/23 0739   tamsulosin (FLOMAX) capsule 0.4 mg  0.4 mg Oral Daily  Lauro Franklin, MD   0.4 mg at 07/24/23 1610    Lab Results:  Results for orders placed or performed during the hospital encounter of 07/21/23 (from the past 48 hours)  Lipid panel     Status: Abnormal   Collection Time: 07/23/23  6:27 AM  Result Value Ref Range   Cholesterol 108 0 - 200 mg/dL   Triglycerides 57 <960 mg/dL   HDL 39 (L) >45 mg/dL   Total CHOL/HDL Ratio 2.8 RATIO   VLDL 11 0 - 40 mg/dL   LDL Cholesterol 58 0 - 99 mg/dL    Comment:        Total Cholesterol/HDL:CHD Risk Coronary Heart Disease Risk Table                     Men   Women  1/2 Average Risk   3.4   3.3  Average Risk       5.0   4.4  2 X Average Risk   9.6   7.1  3 X Average Risk  23.4   11.0        Use the calculated Patient Ratio above and the CHD Risk Table to determine the patient's CHD Risk.        ATP III CLASSIFICATION (LDL):  <100     mg/dL   Optimal  409-811  mg/dL   Near or Above                    Optimal  130-159  mg/dL   Borderline  914-782  mg/dL   High  >956     mg/dL   Very High Performed at [facility], [address], [place], [place] [address]   Hemoglobin A1c     Status: None   Collection Time: 07/23/23  6:27 AM  Result Value Ref Range   Hgb A1c MFr Bld 5.3 4.8 - 5.6 %    Comment: (NOTE)         Prediabetes: 5.7 - 6.4         Diabetes: >6.4         Glycemic control for adults with diabetes: <7.0    Mean Plasma Glucose 105 mg/dL    Comment: (NOTE) Performed At: [facility] [address] [place], [place] [id number] Jolene Schimke MD [id number]   TSH     Status: None   Collection Time: 07/23/23  6:27 AM  Result Value Ref Range   TSH 1.567 0.350 - 4.500 uIU/mL    Comment: Performed by a 3rd Generation assay with a functional sensitivity of <=0.01 uIU/mL. Performed at [facility], [address], [place], [place] [address]     Blood Alcohol level:  Lab Results  Component Value Date   ETH <10 07/18/2023    [facility]  11/17/2007    <5        LOWEST DETECTABLE LIMIT FOR SERUM ALCOHOL IS 11 mg/dL FOR MEDICAL PURPOSES ONLY    Metabolic Disorder Labs: Lab  Results  Component Value Date   HGBA1C 5.3 07/23/2023   MPG 105 07/23/2023   MPG 96.8 04/29/2020   No results found for: "PROLACTIN" Lab Results  Component Value Date   CHOL 108 07/23/2023   TRIG 57 07/23/2023   HDL 39 (L) 07/23/2023   CHOLHDL 2.8 07/23/2023   VLDL 11 07/23/2023   LDLCALC 58 07/23/2023   LDLCALC  06/10/2010    79        Total Cholesterol/HDL:CHD Risk Coronary Heart Disease Risk Table                     Men   Women  1/2 Average Risk   3.4   3.3  Average Risk       5.0   4.4  2 X Average Risk   9.6   7.1  3 X Average Risk  23.4   11.0        Use the calculated Patient Ratio above and the CHD Risk Table to determine the patient's CHD Risk.        ATP III CLASSIFICATION (LDL):  <100     mg/dL   Optimal  161-096  mg/dL   Near or Above                    Optimal  130-159  mg/dL   Borderline  045-409  mg/dL   High  >811     mg/dL   Very High      Musculoskeletal: Strength & Muscle Tone: within normal limits Gait & Station: normal Patient leans: N/A  Psychiatric Specialty Exam:  Presentation  General Appearance:  Appropriate for Environment  Eye Contact: Fair  Speech: Clear and Coherent  Speech Volume: Decreased  Handedness:No data recorded  Mood and Affect  Mood: Dysphoric  Affect: Depressed; Congruent   Thought Process  Thought Processes: Coherent  Descriptions of Associations:Intact  Orientation:Full (Time, Place and Person)  Thought Content:WDL  History of Schizophrenia/Schizoaffective disorder:No data recorded Duration of Psychotic Symptoms:No data recorded Hallucinations:Hallucinations: None  Ideas of Reference:None  Suicidal Thoughts:Suicidal Thoughts: No  Homicidal Thoughts:Homicidal Thoughts: No   Sensorium  Memory: Immediate  Fair  Judgment: Fair  Insight: Fair   Art therapist  Concentration: Fair  Attention Span: Fair  Recall: [employer] of Knowledge: Fair  Language: Fair   Psychomotor Activity  Psychomotor Activity: Psychomotor Activity: Decreased   Assets  Assets: Communication Skills; Desire for Improvement   Sleep  Sleep: Sleep: Fair    Physical Exam: Physical exam: Please see exam on admit note. General: Well developed, well nourished.  Pupils: Normal at 3mm Respiratory: Breathing is unlabored.  Cardiovascular: No edema.  Language: No anomia, no aphasia Muscle strength and tone-pt moving all extremities.  Gait not assessed as pt remained in bed.  Neuro: Facial muscles are symmetric. Pt without tremor, no evidence of hyperarousal.  Review of Systems  Constitutional: Negative.   HENT: Negative.    Eyes: Negative.   Respiratory: Negative.    Cardiovascular: Negative.   Gastrointestinal: Negative.   Genitourinary: Negative.   Musculoskeletal:  Positive for joint pain and myalgias.  Skin: Negative.   Neurological: Negative.   Endo/Heme/Allergies: Negative.   Psychiatric/Behavioral:  Positive for depression. The patient is nervous/anxious.   All other systems reviewed and are negative.  Blood pressure (!) 122/90, pulse 61, temperature 98 F (36.7 C), temperature source Oral, resp. rate 16, height 5\' 11"  (1.803 m), weight 86.2 kg, SpO2 99%. Body mass index  is 26.5 kg/m.   Treatment Plan Summary: Bria Portales. Jamie [name] is a 53 yr old male who presented on 4/9 to [facility] after a Suicide Attempt via OD (100x 0.1 mg Clonidine tablets), he was admitted to [facility] for treatment, he was admitted to [facility] on 4/13.  PPHx is significant for Depression and Anxiety, and no Prior Suicide Attempts, Self Injurious Behavior, or Psychiatric Hospitalizations.    4/14Hilario Lover reporting significant depression, anxiety and hopelessness. Denies SI or HI today. Regrets suicide attempts and  denies wanting to be dead. Want to be present for remaining family. Attending groups. Requesting increased Zoloft dosing and gabapentin being restarted for chronic pain. Patient remains at high acute risk for suicide s/p intention OD attempt.    Observation Level/Precautions:  15 minute checks  Laboratory:  CMP: WNL except Na: 134,  AST: 13,  Alk Phos: 35,  CBC: WNL, EtOH/Acetaminophen/Salicylate: WNL,  UDS: Cocaine and THC pos, EKG: Sinus Bradycardia with Qtc: 433.  Psychotherapy:    Medications:  Zoloft  Consultations:    Discharge Concerns:    Estimated LOS: 3-5 days  Other:      Physician Treatment Plan for Primary Diagnosis: Major depressive disorder, recurrent severe without psychotic features (HCC) Long Term Goal(s): Improvement in symptoms so as ready for discharge   Short Term Goals: Ability to verbalize feelings will improve, Ability to maintain clinical measurements within normal limits will improve, and Ability to identify triggers associated with substance abuse/mental health issues will improve   Physician Treatment Plan for Secondary Diagnosis: Principal Problem:   Major depressive disorder, recurrent severe without psychotic features (HCC) Active Problems:   Substance induced mood disorder (HCC)   Long Term Goal(s): Improvement in symptoms so as ready for discharge   Short Term Goals: Ability to verbalize feelings will improve, Ability to maintain clinical measurements within normal limits will improve, and Ability to identify triggers associated with substance abuse/mental health issues will improve   I certify that inpatient services furnished can reasonably be expected to improve the patient's condition.      MDD, Recurrent, Severe, w/out Psychosis  GAD: -increase Zoloft to 100 mg daily for depression and anxiety. -Continue Agitation Protocol: Haldol/Ativan/Benadryl    Patient with likely rebound HTN from Clonidine discontinuation; restart Clonidine 0.1 mg  q8h-PRN for SBP>179 or DBP>109, ordered q8 vital signs, patient is currently asymptomatic (denies chest pain, headaches, blurred vision, nausea)  - morning provider start amlodipine 5 mg qdaily today -cont Aspirin EC 81 mg daily -cont hydrochlorothiazide 25 mg daily -cont Protonix 40 mg daily -cont Flomax 0.4 mg daily -cont Hydroxyzine 50 mg QHS PRN insomnia -Continue PRN's: Tylenol, Maalox, Atarax, Milk of Magnesia     Daily contact with patient to assess and evaluate symptoms and progress in treatment, Medication management, and Plan increase Zoloft to 50 mg qdaily and start gabapentin 200 mg TID for anxiety and neuropathy     Maricella Filyaw Linnie Riches, MD 07/24/2023, 10:09 AM

## 2023-07-24 NOTE — Progress Notes (Signed)
   07/24/23 2107  Psych Admission Type (Psych Patients Only)  Admission Status Voluntary  Psychosocial Assessment  Patient Complaints Anxiety  Eye Contact Fair  Facial Expression Anxious  Affect Anxious  Speech Logical/coherent  Interaction Assertive  Motor Activity Fidgety  Appearance/Hygiene Unremarkable  Behavior Characteristics Cooperative  Mood Pleasant;Anxious  Thought Process  Coherency WDL  Content WDL  Delusions None reported or observed  Perception WDL  Hallucination None reported or observed  Judgment Impaired  Confusion None  Danger to Self  Current suicidal ideation? Denies  Danger to Others  Danger to Others None reported or observed

## 2023-07-24 NOTE — Plan of Care (Signed)
   Problem: Education: Goal: Emotional status will improve Outcome: Progressing Goal: Mental status will improve Outcome: Progressing

## 2023-07-24 NOTE — Plan of Care (Signed)
  Problem: Education: Goal: Knowledge of [facility] Education information/materials will improve Outcome: Progressing Goal: Emotional status will improve Outcome: Progressing Goal: Mental status will improve Outcome: Progressing Goal: Verbalization of understanding the information provided will improve Outcome: Progressing   Problem: Activity: Goal: Interest or engagement in activities will improve Outcome: Progressing Goal: Sleeping patterns will improve Outcome: Progressing   Problem: Coping: Goal: Ability to verbalize frustrations and anger appropriately will improve Outcome: Progressing Goal: Ability to demonstrate self-control will improve Outcome: Progressing   Problem: Health Behavior/Discharge Planning: Goal: Identification of resources available to assist in meeting health care needs will improve Outcome: Progressing Goal: Compliance with treatment plan for underlying cause of condition will improve Outcome: Progressing   Problem: Physical Regulation: Goal: Ability to maintain clinical measurements within normal limits will improve Outcome: Progressing   Problem: Safety: Goal: Periods of time without injury will increase Outcome: Progressing   Problem: Education: Goal: Knowledge of General Education information will improve Description: Including pain rating scale, medication(s)/side effects and non-pharmacologic comfort measures Outcome: Progressing   Problem: Health Behavior/Discharge Planning: Goal: Ability to manage health-related needs will improve Outcome: Progressing   Problem: Clinical Measurements: Goal: Ability to maintain clinical measurements within normal limits will improve Outcome: Progressing Goal: Will remain free from infection Outcome: Progressing Goal: Diagnostic test results will improve Outcome: Progressing Goal: Respiratory complications will improve Outcome: Progressing Goal: Cardiovascular complication will be  avoided Outcome: Progressing   Problem: Activity: Goal: Risk for activity intolerance will decrease Outcome: Progressing   Problem: Nutrition: Goal: Adequate nutrition will be maintained Outcome: Progressing   Problem: Coping: Goal: Level of anxiety will decrease Outcome: Progressing   Problem: Elimination: Goal: Will not experience complications related to bowel motility Outcome: Progressing Goal: Will not experience complications related to urinary retention Outcome: Progressing   Problem: Pain Managment: Goal: General experience of comfort will improve and/or be controlled Outcome: Progressing   Problem: Safety: Goal: Ability to remain free from injury will improve Outcome: Progressing   Problem: Skin Integrity: Goal: Risk for impaired skin integrity will decrease Outcome: Progressing

## 2023-07-25 DIAGNOSIS — F332 Major depressive disorder, recurrent severe without psychotic features: Secondary | ICD-10-CM | POA: Diagnosis not present

## 2023-07-25 MED ORDER — CLONIDINE HCL 0.1 MG PO TABS
0.1000 mg | ORAL_TABLET | Freq: Four times a day (QID) | ORAL | Status: DC | PRN
  Administered 2023-07-25: 0.1 mg via ORAL
  Filled 2023-07-25 (×2): qty 1

## 2023-07-25 MED ORDER — AMLODIPINE BESYLATE 10 MG PO TABS
10.0000 mg | ORAL_TABLET | Freq: Every day | ORAL | Status: DC
Start: 2023-07-26 — End: 2023-07-26
  Administered 2023-07-26: 10 mg via ORAL
  Filled 2023-07-25 (×3): qty 1

## 2023-07-25 MED ORDER — AMLODIPINE BESYLATE 5 MG PO TABS
5.0000 mg | ORAL_TABLET | Freq: Once | ORAL | Status: AC
  Administered 2023-07-25: 5 mg via ORAL
  Filled 2023-07-25 (×2): qty 1

## 2023-07-25 NOTE — Plan of Care (Signed)
   Problem: Education: Goal: Emotional status will improve Outcome: Progressing Goal: Mental status will improve Outcome: Progressing   Problem: Activity: Goal: Interest or engagement in activities will improve Outcome: Progressing Goal: Sleeping patterns will improve Outcome: Progressing   Problem: Coping: Goal: Ability to demonstrate self-control will improve Outcome: Progressing

## 2023-07-25 NOTE — Group Note (Signed)
 Date:  07/25/2023 Time:  9:40 AM  Group Topic/Focus:  Goals Group:   The focus of this group is to help patients establish daily goals to achieve during treatment and discuss how the patient can incorporate goal setting into their daily lives to aide in recovery.    Participation Level:  Active  Participation Quality:  Appropriate  Affect:  Appropriate     Jamie [name] 07/25/2023, 9:40 AM

## 2023-07-25 NOTE — Progress Notes (Signed)
 On assessment, patient presented with moderate anxiety.  Administered PRN Hydroxyzine for anxiety and sleep per MAR\ per patient request.

## 2023-07-25 NOTE — Progress Notes (Signed)
 During this shift, patient presented with moderate anxiety.  Administered PRN Hydroxyzine per MAR per patient request.  Patient required an additional PRN Hydroxyzine per [facility] per patient request, during early morning.  Patient denies SI/HI/AVH.  Patient is safe on the unit with q15 minute safety checks.

## 2023-07-25 NOTE — Progress Notes (Signed)
   07/25/23 2125  Psych Admission Type (Psych Patients Only)  Admission Status Voluntary  Psychosocial Assessment  Eye Contact Fair  Facial Expression Anxious  Affect Anxious  Speech Logical/coherent  Interaction Assertive  Motor Activity Fidgety  Appearance/Hygiene Unremarkable  Behavior Characteristics Cooperative;Appropriate to situation  Mood Pleasant;Anxious  Thought Process  Coherency WDL  Content WDL  Delusions None reported or observed  Perception WDL  Hallucination None reported or observed  Judgment Impaired  Confusion None  Danger to Self  Current suicidal ideation? Denies  Danger to Others  Danger to Others None reported or observed

## 2023-07-25 NOTE — Progress Notes (Signed)
 [facility] MD Progress Note  07/25/2023 9:49 AM Jamie [name]  MRN:  [id number]  Chart review and nursing report: Staff report patient denying problems this morning, chart review indicates patient is compliant with medications on the unit.  Patient's blood pressure remains occasionally high with sometimes needed use for clonidine as needed, Atarax as needed for anxiety being used average once to twice daily since admission, Atarax as needed for sleep use nightly as well  Subjective: Upon assessment today patient presents continuing to report feeling "great" reports his son is coming to visit tonight, patient reports much improved depressed mood and anxiety since admission, continues to deny passive or active SI intention or plan, denies HI or AVH, continues to regret his suicide attempt "I really regret it I am glad I am here" he denies craving to marijuana or cocaine and agrees to comply with recommendation to abstain completely after discharge.  He reports fair sleep and appetite.  He denies side effect to current medication regimen and agrees to comply after discharge.  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  He is attending groups and participating with no issues reported.  I discussed with patient plan to discharge him tomorrow morning if continues to do well.     Principal Problem: Major depressive disorder, recurrent severe without psychotic features (HCC) Diagnosis: Principal Problem:   Major depressive disorder, recurrent severe without psychotic features (HCC) Active Problems:   Substance induced mood disorder (HCC)   Current severe episode of major depressive disorder without psychotic features (HCC)  Total Time spent with patient: 30 minutes  Past Psychiatric History:  He reports a past psychiatric history significant for depression and anxiety. He reports no history of prior suicide attempts. He reports no history of self-injurious behavior. He reports no prior history of  psychiatric hospitalizations.   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   Hypertension    "Dr Hillary Lowing took me off meds"  diet control   Lumbar radiculopathy    Pre-diabetes     Past Surgical History:  Procedure Laterality Date   BACK SURGERY     CARPAL TUNNEL RELEASE Right 06/25/2013   Procedure: CARPAL TUNNEL RELEASE;  Surgeon: Darrin Emerald, MD;  Location: [facility];  Service: Orthopedics;  Laterality: Right;   CARPAL TUNNEL RELEASE Left 07/25/2013   Procedure: LEFT CARPAL TUNNEL RELEASE;  Surgeon: Darrin Emerald, MD;  Location: [facility];  Service: Orthopedics;  Laterality: Left;   COLONOSCOPY WITH PROPOFOL N/A 04/05/2017   Surgeon: Suzette Espy, MD; normal exam. Recommended 5 year repeat due to family history   COLONOSCOPY WITH PROPOFOL N/A 02/13/2022   Procedure: COLONOSCOPY WITH PROPOFOL;  Surgeon: Suzette Espy, MD;  Location: [facility];  Service: Endoscopy;  Laterality: N/A;  9:15a,. asa 2   ELBOW SURGERY Left    ESOPHAGOGASTRODUODENOSCOPY (EGD) WITH PROPOFOL N/A 02/13/2022   Procedure: ESOPHAGOGASTRODUODENOSCOPY (EGD) WITH PROPOFOL;  Surgeon: Suzette Espy, MD;  Location: [facility];  Service: Endoscopy;  Laterality: N/A;   FOOT SURGERY Right    HERNIA REPAIR Right    inguinal- age 84  I & D EXTREMITY Left 06/12/2021   Procedure: IRRIGATION AND DEBRIDEMENT;  Surgeon: Bradly Bienenstock, MD;  Location: [facility] OR;  Service: Orthopedics;  Laterality: Left;   KNEE ARTHROSCOPY WITH MEDIAL MENISECTOMY Left 12/07/2016   Procedure: KNEE ARTHROSCOPY WITH MEDIAL MENISECTOMY;  Surgeon: Vickki Hearing, MD;  Location: [facility];  Service: Orthopedics;  Laterality: Left;   KNEE  SURGERY     left elbow     LUMBAR LAMINECTOMY/DECOMPRESSION MICRODISCECTOMY Left 10/06/2013   Procedure: Left Lumbar Three-four microdiskectomy;  Surgeon: Cristi Loron, MD;  Location: [facility];  Service: Neurosurgery;  Laterality: Left;  Left Lumbar Three-four microdiskectomy   OPEN REDUCTION INTERNAL FIXATION (ORIF) HAND Left 06/12/2021   Procedure: OPEN REDUCTION INTERNAL FIXATION (ORIF) HAND;  Surgeon: Bradly Bienenstock, MD;  Location: [facility] OR;  Service: Orthopedics;  Laterality: Left;   POLYPECTOMY  02/13/2022   Procedure: POLYPECTOMY;  Surgeon: Corbin Ade, MD;  Location: [facility];  Service: Endoscopy;;   REPAIR OF RUPTURED PATELLA LIGAMENT Left 05/03/2020   Procedure: LEFT KNEE PATELLECTOMY;  Surgeon: Gean Birchwood, MD;  Location: [facility];  Service: Orthopedics;  Laterality: Left;   right foot     forgein body removal   right knee  orif right patella Keeling 1993   SEPTOPLASTY     TOTAL KNEE ARTHROPLASTY Left 02/18/2019   Procedure: TOTAL KNEE ARTHROPLASTY;  Surgeon: Vickki Hearing, MD;  Location: [facility];  Service: Orthopedics;  Laterality: Left;   Family History:  Family History  Problem Relation Age of Onset   Heart disease Other    Arthritis Other    Cancer Other    Asthma Other    Diabetes Other    Kidney disease Other    Colon cancer Father 64   Family Psychiatric  History: Younger Brother- EtOH Abuse No Known Diagnosis' or Suicides Social History:  Social History   Substance and Sexual Activity  Alcohol Use No     Social History   Substance and Sexual Activity  Drug Use Yes   Frequency: 2.0 times per week   Types: Marijuana   Comment: 3 to 4 days ago last use    Social History   Socioeconomic History   Marital status: Divorced    Spouse name: is seperated   Number of children: Not on file   Years of education: 8th grade    Highest education level: Not on file  Occupational History   Occupation: unemployed    Associate Professor: unemployed   Tobacco Use   Smoking status: Some Days    Current packs/day: 1.00    Average packs/day: 1 pack/day for 15.0 years (15.0 ttl pk-yrs)    Types: Cigarettes   Smokeless tobacco: Former    Types: Chew    Quit date: 12/05/1990   Tobacco comments:    one pack a week  Vaping Use   Vaping status: Never Used  Substance and Sexual Activity   Alcohol use: No   Drug use: Yes    Frequency: 2.0 times per week    Types: Marijuana    Comment: 3 to 4 days ago last use   Sexual activity: Never    Birth control/protection: None  Other Topics Concern   Not on file  Social History Narrative   Not on file   Social Drivers of Health   Financial Resource Strain: Not on file  Food Insecurity: No Food Insecurity (07/22/2023)   Hunger Vital Sign    Worried About Running Out of Food in  the Last Year: Never true    Ran Out of Food in the Last Year: Never true  Transportation Needs: No Transportation Needs (07/22/2023)   PRAPARE - Administrator, Civil Service (Medical): No    Lack of Transportation (Non-Medical): No  Physical Activity: Not on file  Stress: Not on file  Social Connections: Not on file   Additional Social History:                         Sleep: Fair  Appetite:  Fair  Current Medications: Current Facility-Administered Medications  Medication Dose Route Frequency Provider Last Rate Last Admin   acetaminophen (TYLENOL) tablet 650 mg  650 mg Oral Q6H PRN Bobbitt, Shalon E, NP   650 mg at 07/25/23 0827   alum & mag hydroxide-simeth (MAALOX/MYLANTA) 200-200-20 MG/5ML suspension 30 mL  30 mL Oral Q4H PRN Bobbitt, Shalon E, NP       [START ON 07/26/2023] amLODipine (NORVASC) tablet 10 mg  10 mg Oral Daily Helmer Dull, MD       amLODipine (NORVASC) tablet 5 mg  5 mg Oral Once Sarita Bottom, MD       aspirin EC tablet 81 mg  81 mg Oral Daily Lauro Franklin, MD   81 mg at 07/25/23 1610   cloNIDine (CATAPRES) tablet 0.1 mg  0.1 mg Oral Q6H PRN Abbott Pao, Shaunika Italiano,  MD       haloperidol (HALDOL) tablet 5 mg  5 mg Oral TID PRN Bobbitt, Shalon E, NP   5 mg at 07/22/23 1015   And   diphenhydrAMINE (BENADRYL) capsule 50 mg  50 mg Oral TID PRN Bobbitt, Shalon E, NP       haloperidol lactate (HALDOL) injection 5 mg  5 mg Intramuscular TID PRN Bobbitt, Shalon E, NP       And   diphenhydrAMINE (BENADRYL) injection 50 mg  50 mg Intramuscular TID PRN Bobbitt, Shalon E, NP       And   LORazepam (ATIVAN) injection 2 mg  2 mg Intramuscular TID PRN Bobbitt, Shalon E, NP       haloperidol lactate (HALDOL) injection 10 mg  10 mg Intramuscular TID PRN Bobbitt, Shalon E, NP       And   diphenhydrAMINE (BENADRYL) injection 50 mg  50 mg Intramuscular TID PRN Bobbitt, Shalon E, NP       And   LORazepam (ATIVAN) injection 2 mg  2 mg Intramuscular TID PRN Bobbitt, Shalon E, NP       gabapentin (NEURONTIN) capsule 200 mg  200 mg Oral TID Woodroe Mode, Dmitri, MD   200 mg at 07/25/23 0804   hydrochlorothiazide (HYDRODIURIL) tablet 25 mg  25 mg Oral Daily Lauro Franklin, MD   25 mg at 07/25/23 0804   hydrOXYzine (ATARAX) tablet 25 mg  25 mg Oral TID PRN Bobbitt, Shalon E, NP   25 mg at 07/25/23 0238   hydrOXYzine (ATARAX) tablet 50 mg  50 mg Oral QHS PRN Lauro Franklin, MD   50 mg at 07/24/23 2107   magnesium hydroxide (MILK OF MAGNESIA) suspension 30 mL  30 mL Oral Daily PRN Bobbitt, Shalon E, NP       nicotine (NICODERM CQ - dosed in mg/24 hours) patch 14 mg  14 mg Transdermal Daily Tallis Soledad, MD   14 mg at 07/25/23 0805   nicotine polacrilex (NICORETTE) gum 2 mg  2 mg Oral PRN Sarita Bottom, MD  2 mg at 07/25/23 0808   pantoprazole (PROTONIX) EC tablet 40 mg  40 mg Oral Daily Pashayan, Alexander S, MD   40 mg at 07/25/23 0804   sertraline (ZOLOFT) tablet 100 mg  100 mg Oral Daily Stefan Markarian, MD   100 mg at 07/25/23 0804   tamsulosin (FLOMAX) capsule 0.4 mg  0.4 mg Oral Daily Pashayan, Alexander S, MD   0.4 mg at 07/25/23 1610    Lab Results:  No results found  for this or any previous visit (from the past 48 hours).   Blood Alcohol level:  Lab Results  Component Value Date   ETH <10 07/18/2023   ETH  11/17/2007    <5        LOWEST DETECTABLE LIMIT FOR SERUM ALCOHOL IS 11 mg/dL FOR MEDICAL PURPOSES ONLY    Metabolic Disorder Labs: Lab Results  Component Value Date   HGBA1C 5.3 07/23/2023   MPG 105 07/23/2023   MPG 96.8 04/29/2020   No results found for: "PROLACTIN" Lab Results  Component Value Date   CHOL 108 07/23/2023   TRIG 57 07/23/2023   HDL 39 (L) 07/23/2023   CHOLHDL 2.8 07/23/2023   VLDL 11 07/23/2023   LDLCALC 58 07/23/2023   LDLCALC  06/10/2010    79        Total Cholesterol/HDL:CHD Risk Coronary Heart Disease Risk Table                     Men   Women  1/2 Average Risk   3.4   3.3  Average Risk       5.0   4.4  2 X Average Risk   9.6   7.1  3 X Average Risk  23.4   11.0        Use the calculated Patient Ratio above and the CHD Risk Table to determine the patient's CHD Risk.        ATP III CLASSIFICATION (LDL):  <100     mg/dL   Optimal  960-454  mg/dL   Near or Above                    Optimal  130-159  mg/dL   Borderline  098-119  mg/dL   High  >147     mg/dL   Very High      Musculoskeletal: Strength & Muscle Tone: within normal limits Gait & Station: normal Patient leans: N/A  Psychiatric Specialty Exam:  Presentation  General Appearance:  Appropriate for Environment  Eye Contact: Fair  Speech: Clear and Coherent  Speech Volume: Normal  Handedness:No data recorded  Mood and Affect  Mood: Euthymic  Affect: Congruent, pleasant and interactive   Thought Process  Thought Processes: Coherent  Descriptions of Associations:Intact  Orientation:Full (Time, Place and Person)  Thought Content:WDL  History of Schizophrenia/Schizoaffective disorder:No data recorded Duration of Psychotic Symptoms:No data recorded Hallucinations:No data recorded  Ideas of  Reference:None  Suicidal Thoughts:No data recorded Denies passive or active SI Homicidal Thoughts:No data recorded Denies HI  Sensorium  Memory: Immediate Fair  Judgment: Fair  Insight: Fair   Art therapist  Concentration: Fair  Attention Span: Fair  Recall: [employer] of Knowledge: Fair  Language: Fair   Psychomotor Activity  Psychomotor Activity: No agitation or retardation noted   Assets  Assets: Manufacturing systems engineer; Desire for Improvement      Physical Exam: Physical exam: Please see exam on admit note. General: Well developed, well nourished.  Pupils: Normal at 3mm Respiratory: Breathing is unlabored.  Cardiovascular: No edema.  Language: No anomia, no aphasia Muscle strength and tone-pt moving all extremities.  Gait not assessed as pt remained in bed.  Neuro: Facial muscles are symmetric. Pt without tremor, no evidence of hyperarousal.  Review of Systems  Constitutional: Negative.   HENT: Negative.    Eyes: Negative.   Respiratory: Negative.    Cardiovascular: Negative.   Gastrointestinal: Negative.   Genitourinary: Negative.   Musculoskeletal: Negative.  Negative for joint pain and myalgias.  Skin: Negative.   Neurological: Negative.   Endo/Heme/Allergies: Negative.   Psychiatric/Behavioral: Negative.  Negative for depression. The patient is not nervous/anxious.   All other systems reviewed and are negative.  Blood pressure (!) 141/102, pulse 85, temperature 98.5 F (36.9 C), temperature source Oral, resp. rate 16, height 5\' 11"  (1.803 m), weight 86.2 kg, SpO2 99%. Body mass index is 26.5 kg/m.   Treatment Plan Summary:    Observation Level/Precautions:  15 minute checks  Laboratory:  CMP: WNL except Na: 134,  AST: 13,  Alk Phos: 35,  CBC: WNL, EtOH/Acetaminophen/Salicylate: WNL,  UDS: Cocaine and THC pos, EKG: Sinus Bradycardia with Qtc: 433.  Psychotherapy:    Medications:  Zoloft  Consultations:    Discharge  Concerns:    Estimated LOS: 3-5 days  Other:      Physician Treatment Plan for Primary Diagnosis: Major depressive disorder, recurrent severe without psychotic features (HCC) Long Term Goal(s): Improvement in symptoms so as ready for discharge   Short Term Goals: Ability to verbalize feelings will improve, Ability to maintain clinical measurements within normal limits will improve, and Ability to identify triggers associated with substance abuse/mental health issues will improve   Physician Treatment Plan for Secondary Diagnosis: Principal Problem:   Major depressive disorder, recurrent severe without psychotic features (HCC) Active Problems:   Substance induced mood disorder (HCC)   Long Term Goal(s): Improvement in symptoms so as ready for discharge   Short Term Goals: Ability to verbalize feelings will improve, Ability to maintain clinical measurements within normal limits will improve, and Ability to identify triggers associated with substance abuse/mental health issues will improve   I certify that inpatient services furnished can reasonably be expected to improve the patient's condition.      MDD, Recurrent, Severe, w/out Psychosis  GAD: - Continue Zoloft 100 mg daily for depression and anxiety. -Continue Agitation Protocol: Haldol/Ativan/Benadryl    Patient with likely rebound HTN from Clonidine discontinuation; continue clonidine as needed for elevated blood pressure  - Titrate Norvasc from 5 to 10 mg daily for better management of blood pressure problem, discussed with patient need to comply with follow-up with primary care provider after discharge for further management and control of hypertension, he agrees. -cont Aspirin EC 81 mg daily -cont hydrochlorothiazide 25 mg daily -cont Protonix 40 mg daily -cont Flomax 0.4 mg daily -cont Hydroxyzine 50 mg QHS PRN insomnia -Continue PRN's: Tylenol, Maalox, Atarax, Milk of Magnesia       Marcayla Budge Linnie Riches, MD 07/25/2023, 9:49  AM

## 2023-07-25 NOTE — Plan of Care (Signed)
 Pt presents with anxious affect and mood. Denies SI, HI, and AVH. Pt reports pain 5/10 in knees, prn tylenol given and pt reports pain relief following recreational time. Reports good sleep and appetite. Cooperative and pleasant in interactions with staff. Medication compliant with no adverse reactions. Safety checks maintained at q 15 minutes. Support, encouragement, and reassurance offered to the pt.   Problem: Education: Goal: Emotional status will improve Outcome: Progressing Goal: Mental status will improve Outcome: Progressing Goal: Verbalization of understanding the information provided will improve Outcome: Progressing   Problem: Activity: Goal: Interest or engagement in activities will improve Outcome: Progressing   Problem: Safety: Goal: Periods of time without injury will increase Outcome: Progressing

## 2023-07-25 NOTE — Group Note (Signed)
 Recreation Therapy Group Note   Group Topic:Other  Group Date: 07/25/2023 Start Time: 1400 End Time: 1440 Facilitators: Brailyn Killion-McCall, LRT,CTRS Location: [address] Dayroom   Activity Description/Intervention: Therapeutic Drumming. Patients with peers and staff were given the opportunity to engage in a leader facilitated [facility] Group Empowerment Drumming Circle with staff from the [employer], in partnership with [employer]. Teaching laboratory technician and trained [employer], Kathlyne Parchment leading with LRT observing and documenting intervention and pt response. This evidenced-based practice targets 7 areas of health and wellbeing in the human experience including: stress-reduction, exercise, self-expression, camaraderie/support, nurturing, spirituality, and music-making (leisure).   Goal Area(s) Addresses:  Patient will engage in pro-social way in music group.  Patient will follow directions of drum leader on the first prompt. Patient will demonstrate no behavioral issues during group.  Patient will identify if a reduction in stress level occurs as a result of participation in therapeutic drum circle.    Education: Leisure exposure, Pharmacologist, Musical expression, Discharge Planning   Affect/Mood: Appropriate   Participation Level: Engaged   Participation Quality: Independent   Behavior: Appropriate   Speech/Thought Process: Focused   Insight: Good   Judgement: Good   Modes of Intervention: Teaching laboratory technician   Patient Response to Interventions:  Engaged   Education Outcome:  In group clarification offered    Clinical Observations/Individualized Feedback: Jamie [name] actively engaged in therapeutic drumming exercise and discussions. Pt was appropriate with peers, staff, and musical equipment for duration of programming.  Pt identified "happy" as their feeling after participation in music-based programming. Pt affect congruent/incongruent with  verbalized emotion.     Plan: Continue to engage patient in RT group sessions 2-3x/week.   Jamie [name], LRT,CTRS 07/25/2023 3:31 PM

## 2023-07-26 DIAGNOSIS — F332 Major depressive disorder, recurrent severe without psychotic features: Secondary | ICD-10-CM | POA: Diagnosis not present

## 2023-07-26 MED ORDER — ASPIRIN 81 MG PO TBEC: 81.0000 mg | DELAYED_RELEASE_TABLET | Freq: Every day | ORAL | 0 refills | Status: AC

## 2023-07-26 MED ORDER — NICOTINE POLACRILEX 2 MG MT GUM
2.0000 mg | CHEWING_GUM | OROMUCOSAL | 0 refills | Status: AC | PRN
Start: 2023-07-26 — End: ?

## 2023-07-26 MED ORDER — HYDROXYZINE HCL 25 MG PO TABS: ORAL_TABLET | ORAL | 0 refills | Status: AC

## 2023-07-26 MED ORDER — HYDROCHLOROTHIAZIDE 25 MG PO TABS
25.0000 mg | ORAL_TABLET | Freq: Every day | ORAL | 0 refills | Status: AC
Start: 2023-07-26 — End: ?

## 2023-07-26 MED ORDER — TAMSULOSIN HCL 0.4 MG PO CAPS
0.4000 mg | ORAL_CAPSULE | Freq: Every day | ORAL | 0 refills | Status: AC
Start: 2023-07-26 — End: ?

## 2023-07-26 MED ORDER — DICLOFENAC SODIUM 1 % EX GEL
2.0000 g | Freq: Four times a day (QID) | CUTANEOUS | 0 refills | Status: AC
Start: 2023-07-26 — End: ?

## 2023-07-26 MED ORDER — AMLODIPINE BESYLATE 10 MG PO TABS: 10.0000 mg | ORAL_TABLET | Freq: Every day | ORAL | 0 refills | Status: AC

## 2023-07-26 MED ORDER — GABAPENTIN 100 MG PO CAPS: 200.0000 mg | ORAL_CAPSULE | Freq: Three times a day (TID) | ORAL | 0 refills | Status: AC

## 2023-07-26 MED ORDER — NICOTINE 14 MG/24HR TD PT24: 14.0000 mg | MEDICATED_PATCH | Freq: Every day | TRANSDERMAL | 0 refills | Status: AC

## 2023-07-26 MED ORDER — PANTOPRAZOLE SODIUM 40 MG PO TBEC
40.0000 mg | DELAYED_RELEASE_TABLET | Freq: Every day | ORAL | 0 refills | Status: AC
Start: 2023-07-26 — End: ?

## 2023-07-26 MED ORDER — SERTRALINE HCL 100 MG PO TABS: 100.0000 mg | ORAL_TABLET | Freq: Every day | ORAL | 0 refills | Status: AC

## 2023-07-26 NOTE — Progress Notes (Signed)
   07/26/23 0840  Psych Admission Type (Psych Patients Only)  Admission Status Voluntary  Psychosocial Assessment  Patient Complaints None  Eye Contact Fair  Facial Expression Animated  Affect Appropriate to circumstance  Speech Logical/coherent  Interaction Assertive  Motor Activity Fidgety  Appearance/Hygiene Unremarkable  Behavior Characteristics Cooperative;Appropriate to situation  Mood Pleasant  Thought Process  Coherency WDL  Content WDL  Delusions None reported or observed  Perception WDL  Hallucination None reported or observed  Judgment Impaired  Confusion None  Danger to Self  Current suicidal ideation? Denies  Danger to Others  Danger to Others None reported or observed

## 2023-07-26 NOTE — Group Note (Signed)
 LCSW Group Therapy Note   Group Date: 07/26/2023 Start Time: 1100 End Time: 1200   Participation:  did not attend  Type of Therapy:  Group Therapy  Title: Lifestyle:  from "One Day" to "Today is Day One"  Objective:  To promote mental and physical well-being through lifestyle changes in routine, nutrition, sleep, and movement.  Goals: Increase awareness of how lifestyle habits impact mental health. Encourage one small, achievable wellness goal. Support group sharing and accountability.  Summary:  Group members explored how daily habits influence mental health and discussed the importance of starting with small, manageable changes. Participants identified personal goals and shared reflections on improving structure, sleep, diet, and physical activity.  Therapeutic Modalities: CBT - Identifying and challenging all-or-nothing thinking; promoting realistic, helpful thoughts about change. Psychoeducation - Teaching about the impact of sleep, nutrition, movement, and routine on mental health. Motivational Interviewing - Eliciting personal motivation and exploring readiness for change. Goal-Setting - Supporting SMART goals to build self-efficacy and encourage follow-through.   Jamie [name] Jamie [name], LCSWA 07/26/2023  12:10 PM

## 2023-07-26 NOTE — Progress Notes (Signed)
  [facility] Adult Case Management Discharge Plan :  Will you be returning to the same living situation after discharge:  Yes,  home At discharge, do you have transportation home?: Yes,  brother to provide. Do you have the ability to pay for your medications: Yes,  active Medicaid  Release of information consent forms completed and in the chart;  Patient's signature needed at discharge.  Patient to Follow up at:  Follow-up Information     [facility] Go on 07/31/2023.   Why: You have a hospital follow up appointment for an assessment to obtain therapy and medication management services on 07/31/23 at 9:00 am.  The appointment will be held in person. Contact information: [address] [place] [address] [phone]                 Next level of care provider has access to [facility] Link:no  Safety Planning and Suicide Prevention discussed: Yes,  completed with son Portia Brittle on 07/26/23  Has patient been referred to the Quitline?: Patient refused referral for treatment  Patient has been referred for addiction treatment: Yes, the patient will follow up with an outpatient provider for substance use disorder. Psychiatrist/APP: appointment made and Therapist: appointment made for [facility] 07/31/23 at [facility], LCSW 07/26/2023, 9:25 AM

## 2023-07-26 NOTE — [facility] Suicide Risk Assessment (Signed)
 [facility] INPATIENT:  Family/Significant Other Suicide Prevention Education  Suicide Prevention Education:  Education Completed; Jamie [name] - son [phone]), has been identified by the patient as the family member/significant other with whom the patient will be residing, and identified as the person(s) who will aid the patient in the event of a mental health crisis (suicidal ideations/suicide attempt).  With written consent from the patient, the family member/significant other has been provided the following suicide prevention education, prior to the and/or following the discharge of the patient.  Spoke with son and he confirmed that visit with father went well on yesterday evening. Additionally shares that patient "looks better than he did when I saw him 7 or 8 days ago." Son confirmed no access to weapons. Son Jamie [name] is aware that plan is for discharge today.   The suicide prevention education provided includes the following: Suicide risk factors Suicide prevention and interventions National Suicide Hotline telephone number [facility] assessment telephone number [facility] Emergency Assistance 911 [facility] and/or Residential Mobile Crisis Unit telephone number  Request made of family/significant other to: Remove weapons (e.g., guns, rifles, knives), all items previously/currently identified as safety concern.   Remove drugs/medications (over-the-counter, prescriptions, illicit drugs), all items previously/currently identified as a safety concern.  The family member/significant other verbalizes understanding of the suicide prevention education information provided.  The family member/significant other agrees to remove the items of safety concern listed above.  Madhuri Vacca N Danee Soller, LCSW 07/26/2023, 9:16 AM

## 2023-07-26 NOTE — Progress Notes (Signed)
 Patient awakened with a headache and reported a little dizziness.  Hydrated Patient, vitals obtained, and BP 147/91.  Patient is anxious.  Administered PRN Tylenol and Hydroxyzine per [facility] per patient request.

## 2023-07-26 NOTE — Progress Notes (Signed)
 Patient discharged from [facility] on 07/26/23 at 12:00pm. Patient denies SI, plan, and intention. Suicide safety plan completed, reviewed with this RN, given to the patient, and a copy in the chart. Patient denies HI/AVH upon discharge. Patient rates his depression a 0/10 and his anxiety a 0/10. Patient is alert, oriented, and cooperative. RN provided patient with discharge paperwork and reviewed information with patient. Patient expressed that he understood all of the discharge instructions. Pt was satisfied with belongings returned to her from the locker and at bedside. Discharged patient to [facility] waiting room. Pt's brother awaiting pt in the [facility] lobby.

## 2023-07-26 NOTE — Group Note (Signed)
 Date:  07/26/2023 Time:  9:12 AM  Group Topic/Focus:  Goals Group:   The focus of this group is to help patients establish daily goals to achieve during treatment and discuss how the patient can incorporate goal setting into their daily lives to aide in recovery.    Participation Level:  Active  Participation Quality:  Attentive  Affect:  Appropriate  Cognitive:  Appropriate  Insight: Good  Engagement in Group:  Engaged  Modes of Intervention:  Education  Additional Comments:  He is active in the group and sharing his life story.  Hughie Mae 07/26/2023, 9:12 AM

## 2023-07-26 NOTE — Discharge Summary (Signed)
 Physician Discharge Summary Note  Patient:  Jamie [name] is an 53 y.o., male MRN:  [id number] DOB:  1970/12/21 Patient phone:  [phone] (home)  Patient address:   [address] [place] [address],  Total Time spent with patient: 45 minutes  Date of Admission:  07/21/2023 Date of Discharge: 07/26/2023  Reason for Admission:  Jamie [name]. Halley is a 53 yr old male who presented on 4/9 to [facility] after a Suicide Attempt via OD (100x 0.1 mg Clonidine tablets), he was admitted to [name] for treatment, [facility] was [facility] to [name]. PPHx is significant for Depression and Anxiety, and no Prior Suicide Attempts, Self Injurious Behavior, or Psychiatric Hospitalizations.   Principal Problem: Major depressive disorder, recurrent severe without psychotic features [name]) Discharge Diagnoses: Principal Problem:   Major depressive disorder, recurrent severe without psychotic features (HCC) Active Problems:   Substance induced mood disorder (HCC)   Current severe episode of major depressive disorder without psychotic features (HCC)   Past Psychiatric History:  He reports a past psychiatric history significant for depression and anxiety. He reports no history of prior suicide attempts. He reports no history of self-injurious behavior. He reports no prior history of psychiatric hospitalizations.  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   Hypertension    "Jamie [name] took me off meds"  diet control   Lumbar radiculopathy    Pre-diabetes     Past Surgical History:  Procedure Laterality Date   BACK SURGERY     CARPAL TUNNEL  RELEASE Right 06/25/2013   Procedure: CARPAL TUNNEL RELEASE;  Surgeon: Vickki Hearing, MD;  Location: [facility];  Service: Orthopedics;  Laterality: Right;   CARPAL TUNNEL RELEASE Left 07/25/2013   Procedure: LEFT CARPAL TUNNEL RELEASE;  Surgeon: Vickki Hearing, MD;  Location: [facility];  Service: Orthopedics;  Laterality: Left;   COLONOSCOPY WITH PROPOFOL N/A 04/05/2017   Surgeon: Corbin Ade, MD; normal exam. Recommended 5 year repeat due to family history   COLONOSCOPY WITH PROPOFOL N/A 02/13/2022   Procedure: COLONOSCOPY WITH PROPOFOL;  Surgeon: Corbin Ade, MD;  Location: [facility];  Service: Endoscopy;  Laterality: N/A;  9:15a,. asa 2   ELBOW SURGERY Left    ESOPHAGOGASTRODUODENOSCOPY (EGD) WITH PROPOFOL N/A 02/13/2022   Procedure: ESOPHAGOGASTRODUODENOSCOPY (EGD) WITH PROPOFOL;  Surgeon: Corbin Ade, MD;  [facility]: [facility];  Service: Endoscopy;  Laterality: N/A;   FOOT SURGERY Right    HERNIA REPAIR Right    inguinal- age 76   I & D EXTREMITY Left 06/12/2021   Procedure: IRRIGATION AND DEBRIDEMENT;  Surgeon: Bradly Bienenstock, MD;  Location: [facility] OR;  Service: Orthopedics;  Laterality: Left;   KNEE ARTHROSCOPY WITH MEDIAL MENISECTOMY Left 12/07/2016   Procedure: KNEE ARTHROSCOPY WITH MEDIAL MENISECTOMY;  Surgeon: Vickki Hearing, MD;  Location: [facility];  Service: Orthopedics;  Laterality: Left;   KNEE SURGERY     left elbow     LUMBAR LAMINECTOMY/DECOMPRESSION MICRODISCECTOMY Left 10/06/2013   Procedure: Left Lumbar Three-four microdiskectomy;  Surgeon: Cristi Loron, MD;  Location: [facility];  Service: Neurosurgery;  Laterality: Left;  Left Lumbar Three-four microdiskectomy   OPEN REDUCTION INTERNAL FIXATION (ORIF) HAND Left 06/12/2021   Procedure: OPEN REDUCTION INTERNAL FIXATION (ORIF) HAND;  Surgeon: Arvil Birks, MD;  Location: [facility] OR;  Service: Orthopedics;  Laterality: Left;   POLYPECTOMY  02/13/2022   Procedure: POLYPECTOMY;  Surgeon: Suzette Espy,  MD;  [facility]: [facility];  Service: Endoscopy;;   REPAIR OF RUPTURED PATELLA LIGAMENT Left 05/03/2020   Procedure: LEFT KNEE PATELLECTOMY;  Surgeon: Wendolyn Hamburger, MD;  Location: [facility];  Service: Orthopedics;  Laterality: Left;   right foot     forgein body removal   right knee  orif right patella Keeling 1993   SEPTOPLASTY     TOTAL KNEE ARTHROPLASTY Left 02/18/2019   Procedure: TOTAL KNEE ARTHROPLASTY;  Surgeon: Darrin Emerald, MD;  Location: [facility];  Service: Orthopedics;  Laterality: Left;    Family History:  Family History  Problem Relation Age of Onset   Heart disease Other    Arthritis Other    Cancer Other    Asthma Other    Diabetes Other    Kidney disease Other    Colon cancer Father 43   Family Psychiatric  History:  Younger Brother- EtOH Abuse No Known Diagnosis' or Suicides Social History:  Social History   Substance and Sexual Activity  Alcohol Use No     Social History   Substance and Sexual Activity  Drug Use Yes   Frequency: 2.0 times per week   Types: Marijuana   Comment: 3 to 4 days ago last use    Social History   Socioeconomic History   Marital status: Divorced    Spouse name: is seperated   Number of children: Not on file   Years of education: 8th grade    Highest education level: Not on file  Occupational History   Occupation: unemployed    Associate Professor: unemployed  Tobacco Use   Smoking status: Some Days    Current packs/day: 1.00    Average packs/day: 1 pack/day for 15.0 years (15.0 ttl pk-yrs)    Types: Cigarettes   Smokeless tobacco: Former    Types: Chew    Quit date: 12/05/1990   Tobacco comments:    one pack a week  Vaping Use   Vaping status: Never Used  Substance and Sexual Activity   Alcohol use: No   Drug use: Yes    Frequency: 2.0 times per week    Types: Marijuana    Comment: 3 to 4 days ago last use   Sexual activity: Never    Birth control/protection: None  Other Topics Concern   Not on file  Social  History Narrative   Not on file   Social Drivers of Health   Financial Resource Strain: Not on file  Food Insecurity: No Food Insecurity (07/22/2023)   Hunger Vital Sign    Worried About Running Out of Food in the Last Year: Never true    Ran Out of Food in the Last Year: Never true  Transportation Needs: No Transportation Needs (07/22/2023)   PRAPARE - Administrator, Civil Service (Medical): No    Lack of Transportation (Non-Medical): No  Physical Activity: Not on file  Stress: Not on file  Social Connections: Not on file   He reports currently lives in a house by himself. He reports he is on disability. He reports finishing the ninth grade. He reports no alcohol use.  He reports he quit smoking approximately 20 years ago and quit chewing tobacco. He reports some THC and cocaine use. He reports no current legal issues. He reports no access to firearms.   Substance Use History:  Smokes 1 to 2 pack of cigarette daily, quit drinking alcohol years ago, reports sporadic marijuana and cocaine use on and off recently probably to self medicate for worsening depression and anxiety  [name] Course:   During the patient's hospitalization, patient had extensive initial psychiatric evaluation, and follow-up psychiatric evaluations every day.   Psychiatric diagnoses provided upon initial assessment: MDD recurrent severe without psychosis, GAD   Patient's psychiatric medications were adjusted on admission: Zoloft was started 25 mg daily for depression and anxiety, Atarax was started during the day and at bedtime as needed for anxiety and sleep.   During the hospitalization, other adjustments were made to the patient's psychiatric medication regimen: Zoloft was titrated up gradually to 100 mg daily, Atarax was continued 25 mg during daytime as needed for anxiety and 50 mg at bedtime as needed for sleep with good efficacy and safety reported.   Patient's care was discussed during the  interdisciplinary team meeting every day during the hospitalization. During [name] stay patient was noted to have elevated blood pressure despite compliance with home medications for chronic medical problems/hypertension, was started on Norvasc at time of admission and the dose was titrated gradually to 10 mg daily with better control of blood pressure problem, patient reported he does not have primary care provider for the past 2 months, at time of discharge he was provided resources for primary care provider to establish care after discharge. The patient denied having side effects to prescribed psychiatric medication. 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  Patient's son visited during [name] stay and agreed with patient's improvement as well as treatment and discharge plan. Gradually, patient started adjusting to milieu. The patient was evaluated each day by a clinical provider to ascertain response to treatment. Improvement was noted by the patient's report of decreasing symptoms, improved sleep and appetite, affect, medication tolerance, behavior, and participation in unit programming.  Patient was asked each day to complete a self inventory noting mood, mental status, pain, new symptoms, anxiety and concerns.     Symptoms were reported as significantly decreased or resolved completely by discharge.    On day of discharge, patient was evaluated on/17/25 the patient reports that their mood is stable. The patient denied having suicidal thoughts for more than 48 hours prior to discharge.  Patient denies having homicidal thoughts.  Patient denies having auditory hallucinations.  Patient denies any visual  hallucinations or other symptoms of psychosis. The patient was motivated to continue taking medication with a goal of continued improvement in mental health.    The patient reports their target psychiatric symptoms of depression, anxiety and SI responded well to the psychiatric medications, and the patient reports overall benefit other psychiatric hospitalization. Supportive psychotherapy was provided to the patient. The patient also participated in regular group therapy while hospitalized. Coping skills, problem solving as well as relaxation therapies were also part of the unit programming.   Labs were reviewed with the patient, and abnormal results were discussed with the patient.  The patient is able to verbalize their individual safety plan to this provider.   Behavioral Events: None   Restraints: None   Groups: Attended and participated   Medications Changes: As above   Sleep  Fair, improved during [name] stay  Physical Findings: AIMS:  , ,  ,  ,    CIWA:    COWS:     Musculoskeletal: Strength & Muscle Tone: within normal limits Gait & Station: normal Patient leans: N/A   Psychiatric Specialty Exam:  General Appearance: appears at stated age, fairly dressed and groomed  Behavior: pleasant and cooperative  Psychomotor Activity:No psychomotor agitation or retardation noted   Eye Contact: good Speech: normal amount, tone, volume and latency   Mood: euthymic Affect: congruent, pleasant and interactive  Thought Process: linear, goal directed, no circumstantial or tangential thought process noted, no racing thoughts or flight of ideas Descriptions of Associations: intact Thought Content: Hallucinations: denies AH, VH , does not appear responding to stimuli Delusions: No paranoia or other delusions noted Suicidal Thoughts: denies SI, intention, plan  Homicidal Thoughts: denies HI, intention, plan   Alertness/Orientation: alert and fully oriented  Insight: fair,  improved Judgment: fair, improved  Memory: intact  Executive Functions  Concentration: intact  Attention Span: Fair Recall: intact Fund of Knowledge: fair   Assets  Assets: Manufacturing systems engineer; Desire for Improvement     Physical Exam:  Physical Exam Vitals and nursing note reviewed.  Constitutional:      Appearance: Normal appearance. He is normal weight.  HENT:     Head: Normocephalic and atraumatic.     Nose: Nose normal.  Eyes:     Extraocular Movements: Extraocular movements intact.  Pulmonary:     Effort: Pulmonary effort is normal.  Musculoskeletal:        General: Normal range of motion.     Cervical back: Normal range of motion.  Neurological:     General: No focal deficit present.     Mental Status: He is alert and oriented to person, place, and time. Mental status is at baseline.  Psychiatric:        Mood and Affect: Mood normal.        Behavior: Behavior normal.        Thought Content: Thought content normal.        Judgment: Judgment normal.    Review of Systems  All other systems reviewed and are negative.  Blood pressure (!) 147/91, pulse 87, temperature 98.5 F (36.9 C), temperature source Oral, resp. rate 16, height 5\' 11"  (1.803 m), weight 86.2 kg, SpO2 100%. Body mass index is 26.5 kg/m.   Social History   Tobacco Use  Smoking Status Some Days   Current packs/day: 1.00   Average packs/day: 1 pack/day for 15.0 years (15.0 ttl pk-yrs)   Types: Cigarettes  Smokeless Tobacco Former   Types: Chew   Quit date: 12/05/1990  Tobacco Comments   one pack a week   Tobacco Cessation:  A prescription for an FDA-approved tobacco cessation medication provided at discharge   Blood Alcohol level:  Lab Results  Component Value Date   ETH <10 07/18/2023   ETH  11/17/2007    <5        LOWEST DETECTABLE LIMIT FOR SERUM ALCOHOL IS 11 mg/dL FOR MEDICAL PURPOSES ONLY    Metabolic Disorder Labs:  Lab Results  Component Value Date   HGBA1C 5.3  07/23/2023   MPG 105 07/23/2023   MPG 96.8 04/29/2020   No results  found for: "PROLACTIN" Lab Results  Component Value Date   CHOL 108 07/23/2023   TRIG 57 07/23/2023   HDL 39 (L) 07/23/2023   CHOLHDL 2.8 07/23/2023   VLDL 11 07/23/2023   LDLCALC 58 07/23/2023   LDLCALC  06/10/2010    79        Total Cholesterol/HDL:CHD Risk Coronary Heart Disease Risk Table                     Men   Women  1/2 Average Risk   3.4   3.3  Average Risk       5.0   4.4  2 X Average Risk   9.6   7.1  3 X Average Risk  23.4   11.0        Use the calculated Patient Ratio above and the CHD Risk Table to determine the patient's CHD Risk.        ATP III CLASSIFICATION (LDL):  <100     mg/dL   Optimal  409-811  mg/dL   Near or Above                    Optimal  130-159  mg/dL   Borderline  914-782  mg/dL   High  >956     mg/dL   Very High    See Psychiatric Specialty Exam and Suicide Risk Assessment completed by Attending Physician prior to discharge.  Discharge destination:  Home patient was discharged home with his brother, stays with a friend  Is patient on multiple antipsychotic therapies at discharge:  No   Has Patient had three or more failed trials of antipsychotic monotherapy by history:  No  Recommended Plan for Multiple Antipsychotic Therapies: NA  Discharge Instructions     Diet - low sodium heart healthy   Complete by: As directed    Increase activity slowly   Complete by: As directed       Allergies as of 07/26/2023       Reactions   Doxepin Other (See Comments)   Dronabinol Other (See Comments)   Tramadol Other (See Comments)   Trazodone Other (See Comments)        Medication List     STOP taking these medications    citalopram 20 MG tablet Commonly known as: CELEXA   mirtazapine 7.5 MG tablet Commonly known as: REMERON   naloxone 4 MG/0.1ML Liqd nasal spray kit Commonly known as: NARCAN       TAKE these medications      Indication  amLODipine 10  MG tablet Commonly known as: NORVASC Take 1 tablet (10 mg total) by mouth daily.  Indication: High Blood Pressure   aspirin EC 81 MG tablet Take 1 tablet (81 mg total) by mouth daily. Swallow whole. Start taking on: July 27, 2023  Indication: Cardioprotective   diclofenac Sodium 1 % Gel Commonly known as: VOLTAREN Apply 2 g topically 4 (four) times daily. What changed:  how much to take when to take this  Indication: Joint Damage causing Pain and Loss of Function   gabapentin 100 MG capsule Commonly known as: NEURONTIN Take 2 capsules (200 mg total) by mouth 3 (three) times daily.  Indication: Neuropathic Pain   hydrochlorothiazide 25 MG tablet Commonly known as: HYDRODIURIL Take 1 tablet (25 mg total) by mouth daily.  Indication: High Blood Pressure   hydrOXYzine 25 MG tablet Commonly known as: ATARAX Take 1 tablet up to twice daily as needed for  anxiety and 2 tablets at bedtime as needed for sleep  Indication: Feeling Anxious   nicotine 14 mg/24hr patch Commonly known as: NICODERM CQ - dosed in mg/24 hours Place 1 patch (14 mg total) onto the skin daily.  Indication: Nicotine Addiction   nicotine polacrilex 2 MG gum Commonly known as: NICORETTE Take 1 each (2 mg total) by mouth as needed for smoking cessation.  Indication: Nicotine Addiction   pantoprazole 40 MG tablet Commonly known as: PROTONIX Take 1 tablet (40 mg total) by mouth daily.  Indication: Gastroesophageal Reflux Disease   sertraline 100 MG tablet Commonly known as: ZOLOFT Take 1 tablet (100 mg total) by mouth daily.  Indication: Major Depressive Disorder   tamsulosin 0.4 MG Caps capsule Commonly known as: FLOMAX Take 1 capsule (0.4 mg total) by mouth daily.  Indication: Benign Enlargement of Prostate        Follow-up Information     [facility] Go on 07/31/2023.   Why: You have a [name] follow up appointment for an assessment to obtain therapy and medication  management services on 07/31/23 at 9:00 am.  The appointment will be held in person. Contact information: [address] [place] [address] [phone]                 Discharge recommendations:   Activity: as tolerated  Diet: heart healthy  # It is recommended to the patient to continue psychiatric medications as prescribed, after discharge from the [name].     # It is recommended to the patient to follow up with your outpatient psychiatric provider and PCP.   # It was discussed with the patient, the impact of alcohol, drugs, tobacco have been there overall psychiatric and medical wellbeing, and total abstinence from substance use was recommended the patient.ed.   # Prescriptions provided or sent directly to preferred pharmacy at discharge. Patient agreeable to plan. Given opportunity to ask questions. Appears to feel comfortable with discharge.    # In the event of worsening symptoms, the patient is instructed to call the crisis hotline, 911 and or go to the nearest ED for appropriate evaluation and treatment of symptoms. To follow-up with primary care provider for other medical issues, concerns and or health care needs   # Patient was discharged home with a plan to follow up as noted above.  -Follow-up with outpatient primary care doctor and other specialists -for management of chronic medical disease, including: Patient was provided resources for primary care provider to establish care after discharge for long-term management of chronic medical problems including GERD, hypertension and benign prostatic hypertrophy.    Patient agrees with D/C instructions and plan.   The patient received suicide prevention pamphlet:  Yes Belongings returned:  Clothing and Valuables  Total Time Spent in Direct Patient Care:  I personally spent 45 minutes on the unit in direct patient care. The direct patient care time included face-to-face time with the patient, reviewing  the patient's chart, communicating with other professionals, and coordinating care. Greater than 50% of this time was spent in counseling or coordinating care with the patient regarding goals of hospitalization, psycho-education, and discharge planning needs.    SignedAlver Jobs, MD 07/26/2023, 9:48 AM

## 2023-07-26 NOTE — Plan of Care (Signed)
   Problem: Education: Goal: Emotional status will improve Outcome: Progressing Goal: Mental status will improve Outcome: Progressing   Problem: Activity: Goal: Interest or engagement in activities will improve Outcome: Progressing

## 2023-07-26 NOTE — [facility] Suicide Risk Assessment (Signed)
 [facility] Discharge Suicide Risk Assessment   Principal Problem: Major depressive disorder, recurrent severe without psychotic features [facility]) Discharge Diagnoses: Principal Problem:   Major depressive disorder, recurrent severe without psychotic features (HCC) Active Problems:   Substance induced mood disorder (HCC)   Current severe episode of major depressive disorder without psychotic features (HCC)   Total Time spent with patient: 45 minutes  Reason for admission: Jamie [name] is a 53 yr old male who presented on 4/9 to [facility] after a Suicide Attempt via OD (100x 0.1 mg Clonidine tablets), he was admitted to [facility] for treatment, he was admitted to [facility] on 4/13. PPHx is significant for Depression and Anxiety, and no Prior Suicide Attempts, Self Injurious Behavior, or Psychiatric Hospitalizations.   PTA Medications:  Medications Prior to Admission  Medication Sig Dispense Refill Last Dose/Taking   aspirin EC 81 MG tablet Take 1 tablet (81 mg total) by mouth daily. Swallow whole. 90 tablet 3     citalopram (CELEXA) 20 MG tablet Take 20 mg by mouth daily. Take with 40 mg to equal 60 mg         diclofenac Sodium (VOLTAREN) 1 % GEL Apply 1 application topically in the morning, at noon, in the evening, and at bedtime.         hydrochlorothiazide (HYDRODIURIL) 25 MG tablet Take 25 mg by mouth daily.          mirtazapine (REMERON) 7.5 MG tablet Take 1 tablet (7.5 mg total) by mouth at bedtime.         naloxone (NARCAN) nasal spray 4 mg/0.1 mL as needed. overdose         pantoprazole (PROTONIX) 40 MG tablet Take 1 tablet by mouth daily.         tamsulosin (FLOMAX) 0.4 MG CAPS capsule Take 1 capsule (0.4 mg total) by mouth daily.          Hospital Course:   During the patient's hospitalization, patient had extensive initial psychiatric evaluation, and follow-up psychiatric evaluations every day.  Psychiatric diagnoses provided upon initial assessment: MDD recurrent severe without psychosis,  GAD  Patient's psychiatric medications were adjusted on admission: Zoloft was started 25 mg daily for depression and anxiety, Atarax was started during the day and at bedtime as needed for anxiety and sleep.  During the hospitalization, other adjustments were made to the patient's psychiatric medication regimen: Zoloft was titrated up gradually to 100 mg daily, Atarax was continued 25 mg during daytime as needed for anxiety and 50 mg at bedtime as needed for sleep with good efficacy and safety reported.  Patient's care was discussed during the interdisciplinary team meeting every day during the hospitalization. During hospital stay patient was noted to have elevated blood pressure despite compliance with home medications for chronic medical problems/hypertension, was started on Norvasc at time of admission and the dose was titrated gradually to 10 mg daily with better control of blood pressure problem, patient reported he does not have primary care provider for the past 2 months, at time of discharge he was provided resources for primary care provider to establish care after discharge. The patient denied having side effects to prescribed psychiatric medication. 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  Patient's son visited during hospital stay and agreed with patient's improvement as well as treatment and discharge plan. Gradually, patient started adjusting to milieu. The patient was evaluated each day by a clinical provider to ascertain response to treatment. Improvement was noted by the patient's report of decreasing symptoms, improved sleep and appetite, affect, medication tolerance, behavior, and  participation in unit programming.  Patient was asked each day to complete a self inventory noting mood, mental status, pain, new symptoms, anxiety and concerns.    Symptoms were reported as significantly decreased or resolved completely by discharge.   On day of discharge, patient was evaluated on/17/25 the patient reports that their mood is stable. The patient denied having suicidal thoughts for more than 48 hours prior to discharge.  Patient denies having homicidal thoughts.  Patient denies having auditory hallucinations.  Patient denies any visual hallucinations or other symptoms of psychosis. The patient was motivated to continue taking medication with a goal of continued improvement in mental health.   The patient reports their target psychiatric symptoms of depression, anxiety and SI responded well to the psychiatric medications, and the patient reports overall benefit other psychiatric hospitalization. Supportive psychotherapy was provided to the patient. The patient also participated in regular group therapy while hospitalized. Coping skills, problem solving as well as relaxation therapies were also part of the unit programming.  Labs were reviewed with the patient, and abnormal results were discussed with the patient.  The patient is able to verbalize their individual safety plan to this provider.  Behavioral Events: None  Restraints: None  Groups: Attended and participated  Medications Changes: As above  Sleep  Fair, improved during hospital stay  Musculoskeletal: Strength & Muscle Tone: within normal limits Gait & Station: normal Patient leans: N/A  Psychiatric Specialty Exam  General Appearance: appears at stated age, fairly dressed and groomed  Behavior: pleasant and cooperative  Psychomotor Activity:No psychomotor agitation or retardation noted   Eye Contact: good Speech: normal amount, tone, volume and latency   Mood: euthymic Affect: congruent, pleasant and  interactive  Thought Process: linear, goal directed, no circumstantial or tangential thought process noted, no racing thoughts or flight of ideas Descriptions of Associations: intact Thought Content: Hallucinations: denies AH, VH , does not appear responding to stimuli Delusions: No paranoia or other delusions noted Suicidal Thoughts: denies SI, intention, plan  Homicidal Thoughts: denies HI, intention, plan   Alertness/Orientation: alert and fully oriented  Insight: fair, improved Judgment: fair, improved  Memory: intact  Executive Functions  Concentration: intact  Attention Span: Fair Recall: intact Fund of Knowledge: fair   Art therapist  Concentration: intact Attention Span: Fair Recall: intact Fund of Knowledge: fair   Assets  Assets: Manufacturing systems engineer; Desire for Improvement   Physical Exam: Physical Exam ROS Blood pressure (!) 147/91, pulse 87, temperature 98.5 F (36.9 C), temperature source Oral, resp. rate 16, height 5\' 11"  (1.803 m), weight 86.2 kg, SpO2 100%. Body mass index is 26.5 kg/m.  Mental Status Per Nursing Assessment::   On Admission:  Self-harm behaviors  Demographic Factors:  Male and Caucasian  Loss Factors: NA  Historical Factors: Prior suicide attempts and Impulsivity  Risk Reduction Factors:   Employed, Living with another person, especially a relative, and Positive social support  Continued Clinical Symptoms: Symptoms of depression improved significantly during hospital stay Depression:   Anhedonia Hopelessness Impulsivity Insomnia  Cognitive Features That Contribute To Risk:  None    Suicide Risk:  Minimal: No identifiable suicidal ideation.  Patients presenting  with no risk factors but with morbid ruminations; may be classified as minimal risk based on the severity of the depressive symptoms   Follow-up Information     [facility] Go on 07/31/2023.   Why: You have a hospital follow up  appointment for an assessment to obtain therapy and medication management services on 07/31/23 at 9:00 am.  The appointment will be held in person. Contact information: [address] [name] [id number] [phone]                 Plan Of Care/Follow-up recommendations:   Discharge recommendations:    Activity: as tolerated  Diet: heart healthy  # It is recommended to the patient to continue psychiatric medications as prescribed, after discharge from the hospital.     # It is recommended to the patient to follow up with your outpatient psychiatric provider and PCP.   # It was discussed with the patient, the impact of alcohol, drugs, tobacco have been there overall psychiatric and medical wellbeing, and total abstinence from substance use was recommended the patient.ed.   # Prescriptions provided or sent directly to preferred pharmacy at discharge. Patient agreeable to plan. Given opportunity to ask questions. Appears to feel comfortable with discharge.    # In the event of worsening symptoms, the patient is instructed to call the crisis hotline, 911 and or go to the nearest ED for appropriate evaluation and treatment of symptoms. To follow-up with primary care provider for other medical issues, concerns and or health care needs   # Patient was discharged home with a plan to follow up as noted above.  -Follow-up with outpatient primary care doctor and other specialists -for management of chronic medical disease, including: Patient was provided resources for primary care provider to establish care after discharge for long-term management of chronic medical problems including GERD, hypertension and benign prostatic hypertrophy.   Patient agrees with D/C instructions and plan.  The patient received suicide prevention pamphlet:  Yes Belongings returned:  Clothing and Valuables  Total Time Spent in Direct Patient Care:  I personally spent 45 minutes on the unit in  direct patient care. The direct patient care time included face-to-face time with the patient, reviewing the patient's chart, communicating with other professionals, and coordinating care. Greater than 50% of this time was spent in counseling or coordinating care with the patient regarding goals of hospitalization, psycho-education, and discharge planning needs.    Amil Moseman Linnie Riches, MD 07/26/2023, 9:41 AM

## 2024-01-04 ENCOUNTER — Ambulatory Visit: Admitting: Student in an Organized Health Care Education/Training Program
